# Patient Record
Sex: Female | Born: 1945 | Race: White | Hispanic: No | Marital: Single | Smoking: Never smoker
Health system: Southern US, Community
[De-identification: ages and names within clinical notes are randomized; demographics above are authoritative.]

## PROBLEM LIST (undated history)

## (undated) DIAGNOSIS — T7840XA Allergy, unspecified, initial encounter: Secondary | ICD-10-CM

## (undated) DIAGNOSIS — M8580 Other specified disorders of bone density and structure, unspecified site: Secondary | ICD-10-CM

## (undated) DIAGNOSIS — C50919 Malignant neoplasm of unspecified site of unspecified female breast: Secondary | ICD-10-CM

## (undated) DIAGNOSIS — M1990 Unspecified osteoarthritis, unspecified site: Secondary | ICD-10-CM

## (undated) DIAGNOSIS — Z8719 Personal history of other diseases of the digestive system: Secondary | ICD-10-CM

## (undated) DIAGNOSIS — B2790 Infectious mononucleosis, unspecified without complication: Secondary | ICD-10-CM

## (undated) DIAGNOSIS — N390 Urinary tract infection, site not specified: Secondary | ICD-10-CM

## (undated) DIAGNOSIS — N059 Unspecified nephritic syndrome with unspecified morphologic changes: Secondary | ICD-10-CM

## (undated) DIAGNOSIS — Z889 Allergy status to unspecified drugs, medicaments and biological substances status: Secondary | ICD-10-CM

## (undated) DIAGNOSIS — E860 Dehydration: Principal | ICD-10-CM

## (undated) DIAGNOSIS — F419 Anxiety disorder, unspecified: Secondary | ICD-10-CM

## (undated) DIAGNOSIS — M797 Fibromyalgia: Secondary | ICD-10-CM

## (undated) DIAGNOSIS — G5600 Carpal tunnel syndrome, unspecified upper limb: Secondary | ICD-10-CM

## (undated) DIAGNOSIS — F32A Depression, unspecified: Secondary | ICD-10-CM

## (undated) DIAGNOSIS — R112 Nausea with vomiting, unspecified: Secondary | ICD-10-CM

## (undated) DIAGNOSIS — F329 Major depressive disorder, single episode, unspecified: Secondary | ICD-10-CM

## (undated) DIAGNOSIS — L709 Acne, unspecified: Secondary | ICD-10-CM

## (undated) DIAGNOSIS — I10 Essential (primary) hypertension: Secondary | ICD-10-CM

## (undated) DIAGNOSIS — Z9889 Other specified postprocedural states: Secondary | ICD-10-CM

## (undated) DIAGNOSIS — E785 Hyperlipidemia, unspecified: Secondary | ICD-10-CM

## (undated) DIAGNOSIS — E88819 Insulin resistance, unspecified: Secondary | ICD-10-CM

## (undated) DIAGNOSIS — Z9221 Personal history of antineoplastic chemotherapy: Secondary | ICD-10-CM

## (undated) DIAGNOSIS — M7989 Other specified soft tissue disorders: Secondary | ICD-10-CM

## (undated) DIAGNOSIS — K759 Inflammatory liver disease, unspecified: Secondary | ICD-10-CM

## (undated) HISTORY — DX: Other specified disorders of bone density and structure, unspecified site: M85.80

## (undated) HISTORY — DX: Unspecified nephritic syndrome with unspecified morphologic changes: N05.9

## (undated) HISTORY — DX: Fibromyalgia: M79.7

## (undated) HISTORY — DX: Allergy, unspecified, initial encounter: T78.40XA

## (undated) HISTORY — PX: EYE SURGERY: SHX253

## (undated) HISTORY — DX: Other specified soft tissue disorders: M79.89

## (undated) HISTORY — DX: Infectious mononucleosis, unspecified without complication: B27.90

## (undated) HISTORY — DX: Depression, unspecified: F32.A

## (undated) HISTORY — DX: Dehydration: E86.0

## (undated) HISTORY — DX: Major depressive disorder, single episode, unspecified: F32.9

## (undated) HISTORY — PX: CATARACT EXTRACTION: SUR2

## (undated) HISTORY — PX: BREAST SURGERY: SHX581

## (undated) HISTORY — DX: Malignant neoplasm of unspecified site of unspecified female breast: C50.919

## (undated) HISTORY — DX: Essential (primary) hypertension: I10

## (undated) HISTORY — PX: KNEE ARTHROSCOPY: SUR90

## (undated) HISTORY — DX: Insulin resistance, unspecified: E88.819

## (undated) HISTORY — DX: Carpal tunnel syndrome, unspecified upper limb: G56.00

## (undated) HISTORY — DX: Hyperlipidemia, unspecified: E78.5

## (undated) HISTORY — DX: Unspecified osteoarthritis, unspecified site: M19.90

---
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
## 1974-09-14 DIAGNOSIS — K759 Inflammatory liver disease, unspecified: Secondary | ICD-10-CM

## 1974-09-14 HISTORY — DX: Inflammatory liver disease, unspecified: K75.9

## 1976-09-14 HISTORY — PX: TUBAL LIGATION: SHX77

## 1979-09-15 HISTORY — PX: ABDOMINAL HYSTERECTOMY: SHX81

## 1981-09-14 HISTORY — PX: OTHER SURGICAL HISTORY: SHX169

## 1984-09-14 HISTORY — PX: OTHER SURGICAL HISTORY: SHX169

## 1989-09-14 HISTORY — PX: ETHMOIDECTOMY: SHX5197

## 1994-09-14 HISTORY — PX: OTHER SURGICAL HISTORY: SHX169

## 1996-09-14 HISTORY — PX: BUNIONECTOMY: SHX129

## 1999-09-15 HISTORY — PX: NASAL SINUS SURGERY: SHX719

## 2000-04-09 ENCOUNTER — Encounter: Payer: Self-pay | Admitting: Orthopedic Surgery

## 2000-04-13 ENCOUNTER — Ambulatory Visit (HOSPITAL_COMMUNITY): Admission: RE | Admit: 2000-04-13 | Discharge: 2000-04-13 | Payer: Self-pay | Admitting: Orthopedic Surgery

## 2000-04-30 ENCOUNTER — Encounter: Admission: RE | Admit: 2000-04-30 | Discharge: 2000-04-30 | Payer: Self-pay | Admitting: *Deleted

## 2000-04-30 ENCOUNTER — Encounter: Payer: Self-pay | Admitting: *Deleted

## 2002-01-23 ENCOUNTER — Other Ambulatory Visit: Admission: RE | Admit: 2002-01-23 | Discharge: 2002-01-23 | Payer: Self-pay | Admitting: *Deleted

## 2002-05-26 ENCOUNTER — Encounter: Admission: RE | Admit: 2002-05-26 | Discharge: 2002-05-26 | Payer: Self-pay | Admitting: Family Medicine

## 2002-05-26 ENCOUNTER — Encounter: Payer: Self-pay | Admitting: Family Medicine

## 2002-07-28 ENCOUNTER — Encounter (INDEPENDENT_AMBULATORY_CARE_PROVIDER_SITE_OTHER): Payer: Self-pay | Admitting: Specialist

## 2002-07-28 ENCOUNTER — Ambulatory Visit (HOSPITAL_COMMUNITY): Admission: RE | Admit: 2002-07-28 | Discharge: 2002-07-28 | Payer: Self-pay | Admitting: Gastroenterology

## 2003-04-16 ENCOUNTER — Encounter: Admission: RE | Admit: 2003-04-16 | Discharge: 2003-07-15 | Payer: Self-pay

## 2003-09-15 HISTORY — PX: JOINT REPLACEMENT: SHX530

## 2004-09-14 HISTORY — PX: TOTAL KNEE ARTHROPLASTY: SHX125

## 2005-01-27 ENCOUNTER — Ambulatory Visit: Payer: Self-pay | Admitting: Physical Medicine & Rehabilitation

## 2005-01-27 ENCOUNTER — Inpatient Hospital Stay (HOSPITAL_COMMUNITY): Admission: RE | Admit: 2005-01-27 | Discharge: 2005-02-02 | Payer: Self-pay | Admitting: Orthopedic Surgery

## 2005-03-13 ENCOUNTER — Other Ambulatory Visit: Admission: RE | Admit: 2005-03-13 | Discharge: 2005-03-13 | Payer: Self-pay | Admitting: Family Medicine

## 2008-08-21 ENCOUNTER — Ambulatory Visit (HOSPITAL_COMMUNITY): Admission: RE | Admit: 2008-08-21 | Discharge: 2008-08-21 | Payer: Self-pay | Admitting: Obstetrics and Gynecology

## 2008-09-14 HISTORY — PX: OTHER SURGICAL HISTORY: SHX169

## 2008-10-10 ENCOUNTER — Encounter: Admission: RE | Admit: 2008-10-10 | Discharge: 2009-01-02 | Payer: Self-pay | Admitting: Surgery

## 2008-12-10 ENCOUNTER — Ambulatory Visit (HOSPITAL_COMMUNITY): Admission: RE | Admit: 2008-12-10 | Discharge: 2008-12-10 | Payer: Self-pay | Admitting: Surgery

## 2008-12-27 ENCOUNTER — Encounter: Admission: RE | Admit: 2008-12-27 | Discharge: 2008-12-27 | Payer: Self-pay | Admitting: Surgery

## 2009-02-07 ENCOUNTER — Encounter: Admission: RE | Admit: 2009-02-07 | Discharge: 2009-04-16 | Payer: Self-pay | Admitting: Surgery

## 2009-05-15 ENCOUNTER — Encounter: Admission: RE | Admit: 2009-05-15 | Discharge: 2009-07-15 | Payer: Self-pay | Admitting: Surgery

## 2009-06-18 ENCOUNTER — Encounter: Admission: RE | Admit: 2009-06-18 | Discharge: 2009-06-18 | Payer: Self-pay | Admitting: Surgery

## 2010-09-14 DIAGNOSIS — C50919 Malignant neoplasm of unspecified site of unspecified female breast: Secondary | ICD-10-CM

## 2010-09-14 DIAGNOSIS — Z9221 Personal history of antineoplastic chemotherapy: Secondary | ICD-10-CM

## 2010-09-14 HISTORY — PX: MASTECTOMY: SHX3

## 2010-09-14 HISTORY — DX: Personal history of antineoplastic chemotherapy: Z92.21

## 2010-09-14 HISTORY — DX: Malignant neoplasm of unspecified site of unspecified female breast: C50.919

## 2010-12-25 LAB — CBC
HCT: 42 % (ref 36.0–46.0)
Platelets: 278 10*3/uL (ref 150–400)
RDW: 13.1 % (ref 11.5–15.5)
WBC: 10.7 10*3/uL — ABNORMAL HIGH (ref 4.0–10.5)

## 2010-12-25 LAB — COMPREHENSIVE METABOLIC PANEL
ALT: 28 U/L (ref 0–35)
AST: 25 U/L (ref 0–37)
Albumin: 3.8 g/dL (ref 3.5–5.2)
Alkaline Phosphatase: 85 U/L (ref 39–117)
Chloride: 101 mEq/L (ref 96–112)
GFR calc Af Amer: >60 mL/min (ref >60)
Potassium: 3.5 mEq/L (ref 3.5–5.1)
Sodium: 137 mEq/L (ref 135–145)
Total Bilirubin: 0.9 mg/dL (ref 0.3–1.2)
Total Protein: 7 g/dL (ref 6.0–8.3)

## 2010-12-25 LAB — DIFFERENTIAL
Basophils Absolute: 0.1 10*3/uL (ref 0.0–0.1)
Basophils Relative: 1 % (ref 0–1)
Eosinophils Relative: 1 % (ref 0–5)
Monocytes Absolute: 0.8 10*3/uL (ref 0.1–1.0)
Monocytes Relative: 7 % (ref 3–12)

## 2011-01-27 NOTE — Op Note (Signed)
NAMEJAICEE, MICHELOTTI               ACCOUNT NO.:  [id number]   MEDICAL RECORD NO.:  [id number]          PATIENT TYPE:  AMB   LOCATION:  DAY                          FACILITY:  [facility]   PHYSICIAN:  Sandria Bales. Ezzard Standing, M.D.  DATE OF BIRTH:  03/07/1946   DATE OF PROCEDURE:  12/10/2008  DATE OF DISCHARGE:                               OPERATIVE REPORT   Date of surgery ?   PREOPERATIVE DIAGNOSES:  1. Morbid obesity (weight 253, BMI of 42.5).  2. Small hiatal hernia.   POSTOPERATIVE DIAGNOSES:  1. Morbid obesity (weight 253, BMI 42.5).  2. Small hiatal hernia.   PROCEDURE:  1. Hiatal hernia repair with two sutures posteriorly to the crus.  2. AP standard lap band.  3. Subcutaneous placement of port.   FIRST ASSISTANT:  Dr. Wenda Low.   ANESTHESIA:  General endotracheal.   BLOOD LOSS:  Minimal.   PROCEDURE:  Ms. Jamal Maes is 65 year old white female who is a patient  Dr. Talmadge Coventry who has completed our bariatric program and is  interested in a lap band.   The indications, potential complications of lap band placement were  explained to the patient.  Potential complication of lap band placed  include but not limited to bleeding, infection, bowel injury, and  slippage of the band.   OPERATIVE NOTE:  The patient placed in a supine position and given a  general endotracheal anesthetic supervised by Neta Mends.  She was  given 1 gram of Ancef initially at this the procedure.  Her abdomen was  prepped with chlorhexidine and a time-out held, identifying the patient  and the procedure.   DESCRIPTION OF PROCEDURE:  I accessed her abdominal cavity through the  left upper quadrant with 11 mm OptiVu  Ethicon trocar.  This entered the  abdominal cavity without difficulty.  I did abdominal exploration.  The  right and left lobes of liver unremarkable.  The bowel, that I could  see, was unremarkable.  Omentum covered some of the bowel and the  stomach was unremarkable.   I  placed 5 additional trocars, a 5-mm subxiphoid trocar for the liver,  retracted into the liver, 15 mm trocar in the right subcostal location,  and an 11 mm right paramedian trocar and a 11 mm left paramedian trocar  and 11 mm left lateral trocar.   Examining the gastroesophageal junction, the patient did have a dimple  anterior to her gastroesophageal junction consistent with a probable  small hiatal hernia.  I went on and made an incision along the left  gastroesophageal junction angle of Hiss and identified the left crus.  I  opened the gastrohepatic ligament, identified the right crus and I  passed the finger dissector posteriorly to the angle of Hiss.   I then introduced an AP standard band and passed this around with the  tubing around but not the band itself around.  I then had anesthesia  passed down our sizing tube.  This was insufflated 15 mL of air and  pulled back and proved that she did have a widely open hiatus that I  thought aught to be repaired.   I then identified both the right and the left crura posteriorly and I  placed two sutures closing this by about 3 cm.  They then repassed the  sizing tube into the stomach and re-insufflated the air with 15 mL.  At  this time it held to the hiatus, indicating an adequate closure of the  hiatal hernia.   I then brought the balloon device of the lap band around and cinched  this down on the sizing tube without difficulty.  The sizing tube was  then removed.  I then placed three sutures using 0 Ethilon suture,  imbricating the lower stomach over the stomach above the band.  This was  done sort of left lateral and used tie knots to cinch down this suture.   After the band had been imbricated, I then reinspected the band.  I took  photos and placed in the chart.  The band lay in correct orientation.  There was not too much tension on the stomach or the band and there was  no bleeding.   I then retrieved the tip of the Silastic  tubing out through the right  paramedian port.  I removed the retractor and removed all the trocars  under direct visualization.  There was no bleeding at trocar site.   She has a fairly thick abdominal wall and I placed a backing of  polypropylene on the port.  I then attached the port to the Silastic  tubing and place silk pocket immediately lateral to her right paramedian  incision.   I placed subcutaneous 3-0 Vicryl sutures.  Each wound itself was closed  with 5-0 subcuticular Vicryl suture.  I injected about 20 mL of 4%  Marcaine plain in each of the incisions and then the wounds were painted  with tincture Benzoin, Steri-stripped and sterilely dressed.   The patient tolerated the procedure well, was transported to recovery  room in good condition.  Sponge and needle counts were at the end of the  case.  My plan is to let her go home today if she does well.      Sandria Bales. Ezzard Standing, M.D.  Electronically Signed     DHN/MEDQ  D:  12/10/2008  T:  12/10/2008  Job:  [id number]   cc:   Talmadge Coventry, M.D.  Fax: [phone]   Marlowe Kays, M.D.  Fax: [phone]   Cindee Salt, M.D.  Fax: [phone]   Estill Bakes, M.D.   Gardiner Coins, P.A.-C.  [address]  [place], [place] [address]

## 2011-01-30 NOTE — Discharge Summary (Signed)
[name], [name]               ACCOUNT NO.:  [id number]   MEDICAL RECORD NO.:  [id number]          PATIENT TYPE:  INP   LOCATION:  1508                         FACILITY:  [facility]   PHYSICIAN:  Marlowe Kays, M.D.  DATE OF BIRTH:  June 30, 1946   DATE OF ADMISSION:  01/27/2005  DATE OF DISCHARGE:  02/02/2005                                 DISCHARGE SUMMARY   ADMITTING DIAGNOSES:  1.  Severe osteoarthritis of the left knee.  2.  Dyslipidemia.  3.  Hypertension.  4.  Gastroesophageal reflux disease.   DISCHARGE DIAGNOSES:  1.  Severe osteoarthritis of the left knee.  2.  Dyslipidemia.  3.  Hypertension.  4.  Gastroesophageal reflux disease.  5.  Mild postoperative anemia.   OPERATION:  On Jan 27, 2005, the patient underwent [employer]onics total knee  replacement arthroplasty to the left knee, all three components cemented,  Dr. Ranee Gosselin assisted.   CONSULTANTS:  None.   BRIEF HISTORY:  This 65 year old lady had progressive pain into her left  knee.  She had a scope in the past x 2 which provided her with some relief;  however, at this point in time, she is having marked interference with her  day-to-day activities with a considerable amount of pain.  After much  discussion including the risks and benefits from surgery and the fact that  she did not benefit with conservative care recently, it was felt she would  benefit from total replacement arthroplasty and was scheduled for same.  X-  rays showed severe osteoarthritis arthritis.   COURSE IN HOSPITAL:  The patient tolerated the surgical procedure quite  well.  She was placed on Coumadin protocol postoperatively for prevention of  DVT.  The Hemovac was discontinued the first postop day with no difficulty.  Dressing change was done on the second day.  Wound remained clean and dry.  The patient progressed very slowly with therapy.  It was thought that she  might need a skilled nursing facility or even a rehab program;  however, the  last day or two in the hospital, the family unit came together and felt that  they could help her at home.  She did live essentially alone.  We therefore  changed plans for home health and home DME.   On the day of discharge, she was ambulating well.  Wound was clean and dry.  Neurovascular was intact in the operative extremity.  She was quite stable,  awake, alert, and relatively comfortable on her medications.   Laboratory values in the hospital hematologically showed a CBC  preoperatively essentially within normal limits other than a slight elevated  white count at 12.5.  Hemoglobin was 14.0, hematocrit 41.6.  Final  hemoglobin was 9.5 with hematocrit of 28.0.  Blood chemistries all within  normal limits, and urinalysis negative for urinary tract infection.  Electrocardiogram showed normal sinus rhythm with possible left atrial  enlargement.  Electrocardiogram showed no evidence for active chest disease.   CONDITION ON DISCHARGE:  Improved, stable.   PLAN:  The patient is transferred to her home in the care of her  family.  She can continue with weightbearing as tolerated to the operative extremity.  return to see Dr. Simonne Come 2 weeks after the date of surgery.  She is to  continue with her home medications and diet and follow up with Dr. Smith Mince  per her instructions.  We have given her medications for discomfort.  Talwin  1 tablet every 4 hours as needed for pain, Robaxin 500 mg q.4-6h. p.r.n.  muscle spasm, and Coumadin for 4 weeks after the date of surgery.  She may  shower 4 days after surgery.      DLU/MEDQ  D:  02/13/2005  T:  02/14/2005  Job:  [id number]   cc:   Talmadge Coventry, M.D.  [address]  [place]  [place] [address]  Fax: [phone]
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
## 2011-03-17 ENCOUNTER — Other Ambulatory Visit: Payer: Self-pay | Admitting: Family Medicine

## 2011-03-17 DIAGNOSIS — Z780 Asymptomatic menopausal state: Secondary | ICD-10-CM

## 2011-03-17 DIAGNOSIS — Z1231 Encounter for screening mammogram for malignant neoplasm of breast: Secondary | ICD-10-CM

## 2011-03-27 ENCOUNTER — Other Ambulatory Visit: Payer: Self-pay

## 2011-03-27 ENCOUNTER — Ambulatory Visit: Payer: Self-pay

## 2011-04-03 ENCOUNTER — Ambulatory Visit: Payer: Self-pay

## 2011-04-03 ENCOUNTER — Other Ambulatory Visit: Payer: Self-pay

## 2011-04-23 ENCOUNTER — Other Ambulatory Visit: Payer: Self-pay

## 2011-04-23 ENCOUNTER — Ambulatory Visit: Payer: Self-pay

## 2011-05-07 ENCOUNTER — Ambulatory Visit
Admission: RE | Admit: 2011-05-07 | Discharge: 2011-05-07 | Disposition: A | Discharge status: Home / Self Care | Payer: BC Managed Care – PPO | Source: Ambulatory Visit | Attending: Family Medicine | Admitting: Family Medicine

## 2011-05-07 DIAGNOSIS — Z1231 Encounter for screening mammogram for malignant neoplasm of breast: Secondary | ICD-10-CM

## 2011-05-07 DIAGNOSIS — Z780 Asymptomatic menopausal state: Secondary | ICD-10-CM

## 2011-05-14 ENCOUNTER — Other Ambulatory Visit: Payer: Self-pay | Admitting: Family Medicine

## 2011-05-14 DIAGNOSIS — R928 Other abnormal and inconclusive findings on diagnostic imaging of breast: Secondary | ICD-10-CM

## 2011-05-19 ENCOUNTER — Other Ambulatory Visit: Payer: Self-pay | Admitting: Family Medicine

## 2011-05-19 DIAGNOSIS — R928 Other abnormal and inconclusive findings on diagnostic imaging of breast: Secondary | ICD-10-CM

## 2011-05-22 ENCOUNTER — Other Ambulatory Visit: Payer: Self-pay | Admitting: Family Medicine

## 2011-05-22 DIAGNOSIS — R928 Other abnormal and inconclusive findings on diagnostic imaging of breast: Secondary | ICD-10-CM

## 2011-05-27 ENCOUNTER — Ambulatory Visit
Admission: RE | Admit: 2011-05-27 | Discharge: 2011-05-27 | Disposition: A | Discharge status: Home / Self Care | Payer: BC Managed Care – PPO | Source: Ambulatory Visit | Attending: Family Medicine | Admitting: Family Medicine

## 2011-05-27 ENCOUNTER — Other Ambulatory Visit: Payer: Self-pay | Admitting: Family Medicine

## 2011-05-27 DIAGNOSIS — R928 Other abnormal and inconclusive findings on diagnostic imaging of breast: Secondary | ICD-10-CM

## 2011-05-28 ENCOUNTER — Other Ambulatory Visit: Payer: Self-pay | Admitting: Family Medicine

## 2011-05-28 ENCOUNTER — Ambulatory Visit
Admission: RE | Admit: 2011-05-28 | Discharge: 2011-05-28 | Disposition: A | Discharge status: Home / Self Care | Payer: BC Managed Care – PPO | Source: Ambulatory Visit | Attending: Family Medicine | Admitting: Family Medicine

## 2011-05-28 DIAGNOSIS — R928 Other abnormal and inconclusive findings on diagnostic imaging of breast: Secondary | ICD-10-CM

## 2011-05-28 DIAGNOSIS — C50911 Malignant neoplasm of unspecified site of right female breast: Secondary | ICD-10-CM

## 2011-06-02 ENCOUNTER — Ambulatory Visit
Admission: RE | Admit: 2011-06-02 | Discharge: 2011-06-02 | Disposition: A | Discharge status: Home / Self Care | Payer: BC Managed Care – PPO | Source: Ambulatory Visit | Attending: Family Medicine | Admitting: Family Medicine

## 2011-06-02 DIAGNOSIS — C50911 Malignant neoplasm of unspecified site of right female breast: Secondary | ICD-10-CM

## 2011-06-02 MED ORDER — GADOBENATE DIMEGLUMINE 529 MG/ML IV SOLN
20.0000 mL | Freq: Once | INTRAVENOUS | Status: AC | PRN
  Administered 2011-06-02: 20 mL via INTRAVENOUS

## 2011-06-03 ENCOUNTER — Encounter (INDEPENDENT_AMBULATORY_CARE_PROVIDER_SITE_OTHER): Payer: Self-pay | Admitting: General Surgery

## 2011-06-03 ENCOUNTER — Ambulatory Visit: Payer: BC Managed Care – PPO | Attending: General Surgery | Admitting: Physical Therapy

## 2011-06-03 ENCOUNTER — Encounter (INDEPENDENT_AMBULATORY_CARE_PROVIDER_SITE_OTHER): Payer: BC Managed Care – PPO | Admitting: General Surgery

## 2011-06-03 ENCOUNTER — Encounter (HOSPITAL_BASED_OUTPATIENT_CLINIC_OR_DEPARTMENT_OTHER): Payer: BC Managed Care – PPO | Admitting: Oncology

## 2011-06-03 ENCOUNTER — Other Ambulatory Visit: Payer: Self-pay | Admitting: Oncology

## 2011-06-03 ENCOUNTER — Ambulatory Visit: Payer: BC Managed Care – PPO | Admitting: Physical Therapy

## 2011-06-03 ENCOUNTER — Ambulatory Visit (HOSPITAL_BASED_OUTPATIENT_CLINIC_OR_DEPARTMENT_OTHER): Payer: BC Managed Care – PPO | Admitting: General Surgery

## 2011-06-03 VITALS — BP 134/81 | HR 74 | Temp 98.3°F | Resp 20 | Ht 63.4 in | Wt 203.5 lb

## 2011-06-03 VITALS — BP 139/82 | HR 73 | Temp 97.9°F | Resp 20 | Ht 63.5 in | Wt 223.8 lb

## 2011-06-03 DIAGNOSIS — IMO0001 Reserved for inherently not codable concepts without codable children: Secondary | ICD-10-CM | POA: Insufficient documentation

## 2011-06-03 DIAGNOSIS — M25619 Stiffness of unspecified shoulder, not elsewhere classified: Secondary | ICD-10-CM | POA: Insufficient documentation

## 2011-06-03 DIAGNOSIS — C50919 Malignant neoplasm of unspecified site of unspecified female breast: Secondary | ICD-10-CM | POA: Insufficient documentation

## 2011-06-03 DIAGNOSIS — C50219 Malignant neoplasm of upper-inner quadrant of unspecified female breast: Secondary | ICD-10-CM

## 2011-06-03 LAB — COMPREHENSIVE METABOLIC PANEL
Albumin: 3.6 g/dL (ref 3.5–5.2)
CO2: 27 mEq/L (ref 19–32)
Chloride: 98 mEq/L (ref 96–112)
Glucose, Bld: 120 mg/dL — ABNORMAL HIGH (ref 70–99)
Potassium: 4 mEq/L (ref 3.5–5.3)
Sodium: 134 mEq/L — ABNORMAL LOW (ref 135–145)
Total Protein: 7 g/dL (ref 6.0–8.3)

## 2011-06-03 LAB — CBC WITH DIFFERENTIAL/PLATELET
Eosinophils Absolute: 0.2 10*3/uL (ref 0.0–0.5)
MONO#: 0.6 10*3/uL (ref 0.1–0.9)
NEUT#: 3.5 10*3/uL (ref 1.5–6.5)
RBC: 4.66 10*6/uL (ref 3.70–5.45)
RDW: 12.9 % (ref 11.2–14.5)
WBC: 6.6 10*3/uL (ref 3.9–10.3)

## 2011-06-03 LAB — CANCER ANTIGEN 27.29: CA 27.29: 28 U/mL (ref 0–39)

## 2011-06-03 NOTE — Patient Instructions (Signed)
Breast Cancer, Early Stage, Surgery Choices Breast cancer is the most common cancer, except for skin cancer. It is the second most common cause of death, except for lung cancer, in women. The risk of a woman developing breast cancer is 12.5%. Breast cancer in women younger than 65 years old is rare. Most of these cancer cases have spread to the breast from another part of the body (metastatic). Finding out that you have breast cancer is an emotional shock and is difficult to understand, because it is an overwhelming, serious, and life-threatening disease. You will need to make important decisions about how you want it treated.  As a woman with early-stage breast cancer (DCIS or Stage I, IIA, IIB, IIIA, or IV) you may be able to choose which type of breast surgery to have. Your caregiver will help you understand what stage you are in. Often, your choices are:  Removal of the cancerous part(s) of the breast (breast-sparing surgery).   Lumpectomy.   Partial/Segmental mastectomy.   Removal of the whole breast (simple mastectomy). Research shows that women with early-stage breast cancer who have breast-sparing surgery along with radiation therapy live as long as those who have a mastectomy. Most women with breast cancer will lead long, healthy lives after treatment.   Reconstructive surgery. This type of surgery is to make the breast and nipple area as normal looking as it was before the mastectomy. It is usually done by a plastic surgeon at the same time as the mastectomy. There are several types of reconstructive surgery that should be discussed with your caregiver and plastic surgeon.   Lymph node surgery.   Sentinel (removing those lymph nodes in the armpit that breast cancer spreads to first).   Axillary lymph node dissection (removing all the lymph nodes in the armpit).  TREATMENT Treatment for breast cancer usually begins a few weeks after diagnosis. In these weeks, you should meet with a  surgeon, learn the facts about your surgery choices, treatment options, get a second opinion, and think about what is important to you.  Treatment choices include:  Surgery.   Radiation.   Chemotherapy.   Medicines, hormones.   Reconstructive breast surgery.   Any combination of the above treatments.  Choose which kind of surgery to have. If radiation, chemotherapy, or hormone therapy is considered, this also should be discussed before the surgery. Radical breast surgery is rarely performed now. Usually, lumpectomy, partial/segmental mastectomy, simple mastectomy in combination with radiation, chemotherapy, and/or hormone treatment is recommended. Clinical trial protocols (specific treatment plan with surgery and radiation, chemotherapy, and hormones) for breast cancer have been put in place in hospitals, universities, medical schools, and cancer centers all over the country. These patients are followed for several years to find out what treatment gives the best results for the protocol given, for the different stages of breast cancer.  Most women want to make this choice with help from their caregiver. After all, the kind of surgery you have will affect how you look and feel. But it is often hard to decide what to do. The more information you have, the better the decision you can make.  Talk to a surgeon about your breast cancer surgery choices. Find out what happens during surgery, types of problems that sometimes occur, and other kinds of treatment (if any) you will need after surgery. Be sure to ask a lot of questions and learn as much as you can. You may also wish to talk with family members, friends, or  others who have had breast cancer surgery, radiation therapy, chemotherapy, or hormone therapy.   After talking with a surgeon, you may want a second opinion. This means talking with another doctor or surgeon who might tell you about other treatment options. They may simply give you  information that can help you feel better about the choice you are making. Do not worry about hurting your surgeon's feelings. It is common practice to get a second opinion, and some insurance companies require it. Plus, it is better to get a second opinion than to worry that you have made the wrong choice.  STAGES OF BREAST CANCER Staging of breast cancer depends on the size of the tumor, if and how far it has spread, lymph node involvement in the armpits, and whether it has spread to other parts of the body. If you are unsure of the stage of your cancer, ask your caregiver. The following are the stages of breast cancer:  Stage 0: This means you either have DCIS or LCIS.   DCIS (Ductal Carcinoma in Situ) is very early breast cancer. It is often too small to form a lump. Your caregiver may refer to DCIS as noninvasive cancer.   LCIS (Lobular Carcinoma in Situ) is not cancer, but it may increase the chance that you will get breast cancer. Talk with your caregiver about treatment options, if you are diagnosed with LCIS.   The 5 year survival rate in this stage is almost 100%.   Stage I:   Your cancer is less than 1 inch across (2 centimeters) or about the size of a quarter. The cancer is only in the breast. It has not spread to lymph nodes or other parts of your body.   Stage IIA:   No cancer is found in your breast. However, cancer is found in the lymph nodes under your arm, or   Your cancer is 1 inch (2 centimeters) or smaller. It has spread to 3 lymph nodes or less (the lymph nodes under your arm), or   Your cancer is about 1-2 inches (2-5 centimeters). It has not spread to the lymph nodes under your arm.   Stage IIB:   Your cancer is about 1-2 inches (2-5 centimeters). It has spread to the lymph nodes under your arm, or   Your cancer is larger than 2 inches (5 centimeters). It has not spread to the lymph nodes under your arm.   The 5 year survival rate is 85 to 90%.   Stage IIIA:     No cancer is found in the breast. It is found in lymph nodes under your arm. The lymph nodes are attached to each other, or   Your cancer is 2 inches (5 centimeters) or smaller. It has spread to lymph nodes under your arm. The lymph nodes are attached to each other, or   Your cancer is larger than 2 inches (5 centimeters) and has spread to lymph nodes under your arm, and they are not attached to each other.   Your cancer is smaller than 2 inches (5 centimeters) and has spread to lymph nodes above your collarbone.   Inflammatory cancer of the breast (red rash, tender and swollen breasts) is in the stage III category.   The 5 year survival rate is 45 to 67%.   Stage VI:   Cancer cells have spread to other parts of the body.   The 5 year survival rate is about 20%.  ABOUT LYMPH NODES  Lymph nodes  are part of your body's immune system, which helps fight infection and disease. Lymph nodes are small, round, and clustered (like a bunch of grapes) throughout your body.   Axillary lymph nodes are in the area under your arm. Breast cancer may spread to these lymph nodes first, even when the tumor in the breast is small. This is why most surgeons take out some of these lymph nodes at the time of breast surgery.   Lymphedema is swelling caused by a buildup of lymph fluid. You may have this type of swelling in your arm, if your lymph nodes are taken out with surgery or damaged by radiation therapy.   Lymphedema can show up soon after surgery. The symptoms are often mild and last for a short time.   Lymphedema can show up months or even years after cancer treatment is over. Often, lymphedema develops after an insect bite, minor injury, or burn on the arm where your lymph nodes were removed. Sometimes, this can be painful. One way to reduce the swelling is to work with a caregiver who specializes in rehabilitation, or with a physical therapist.  Sentinel lymph node biopsy is surgery to remove as few  lymph nodes as possible from under the arm. The surgeon first injects a dye in the breast to see which lymph nodes the breast tumor drains into. Then, he or she removes these nodes to see if they have any cancer. If there is no cancer, the surgeon may leave the other lymph nodes in place. This surgery is fairly new and is being studied experimentally. Talk with your surgeon if you want to learn more.  COMPARE YOUR CHOICES   Breast-Sparing Surgery Mastectomy Surgery Mastectomy and Breast Reconstruction Surgery  Is this surgery right for me? Breast-sparing surgery with radiation is a safe choice for most women who have early-stage breast cancer. This means that your cancer is DCIS or at Stage I, IIA, IIB, or IIIA. Mastectomy is a safe choice for women who have early-stage breast cancer (DCIS, Stage I, IIA, IIB, or IIIA). You may need a mastectomy if:   You have small breasts and a large tumor.   You have cancer in more than one part of your breast.   The tumor is under the nipple.   You do not have access to radiation therapy.  If you have a mastectomy, you might also want breast reconstruction surgery.   You can choose to have reconstruction surgery at the same time as your mastectomy.   You can wait and have it at a later date.   What are the names of the different kinds of surgery?  Lumpectomy.   Partial mastectomy.   Breast-sparing surgery.   Segmental mastectomy.   Simple mastectomy.   Sentinel lymph node removal.   Axillary lymph node excision.   Total mastectomy.   Modified radical mastectomy (not usually done now).   Double mastectomy.   Simple mastectomy.   Breast implant.   Tissue flap surgery.   Reconstructive plastic surgery.   What doctors am I likely to see?  Oncologist.   Surgeon.   Radiation Oncologist.   Chemotherapy Oncologist.   Oncologist.   Surgeon.   Radiation Oncologist.   Chemotherapy Oncologist.   Oncologist.   Surgeon.   Radiation  Oncologist.   Reconstructive Plastic Surgeon.   Chemotherapy Oncologist.   What will my breast look like after surgery? Your breast should look a lot like it did before surgery. But if your tumor is  large, your breast may look different or smaller after breast-sparing surgery. Your breast and nipple will be removed. You will have a flat chest on the side of your body where the breast was removed. Although you will have a breast-like shape, your breast will not look the same as it did before surgery.  Will I have feeling in the area around my breast? Yes. You should still have feeling in your breast, nipple, and areola (dark area around your nipple). Maybe. After surgery, you will have no feeling (numb) in your chest wall and maybe also under your arm. This numb feeling should go away in 1-2 years, but it will never feel like it did before. Also, the skin where your breast was may feel tight. No. The area around your breast will always be numb (have no feeling).  Will I have pain after the surgery? You may have pain after surgery. Talk with your caregiver about ways to control this pain.  You may have pain after surgery. Talk with your caregiver about ways to control this pain. You are likely to have pain after major surgery, such as mastectomy and reconstruction surgery. There are many ways to deal with pain. Let your caregiver know if you need relief from pain.   What other problems can I expect? You may feel very tired after radiation therapy.  You may get swelling in your arm (lymphedema). You may have pain in your neck or back. You may feel out of balance, if you had large breasts and do not have reconstruction surgery.  You may get swelling in your arm (lymphedema). It may take you many weeks or even months to recover from breast reconstruction surgery.  If you have an implant, you may get infections, pain, or hardness. Also, you may not like how your breast-like shape looks. You may need more  surgery if your implant breaks or leaks.  If you have tissue flap surgery, you may lose strength in the part of your body where the flap came from.  You may get swelling in your arm (lymphedema).   Will I need more surgery? Maybe. You may need more surgery to remove lymph nodes from under your arm. Also, if the surgeon does not remove all your cancer the first time, you may need more surgery.  Maybe. You may need surgery to remove lymph nodes from under your arm. Also, if you have problems after your mastectomy, you may need to see your surgeon for treatment. Yes. You will need surgery at least 2 more times to build a new breast-like shape. With implants, you may need more surgery months or years later. You may also need surgery to remove lymph nodes from under your arm.  What other types of treatment will I need? You may need radiation therapy, given almost every day for 5 to 8 weeks. You may also need chemotherapy, hormone therapy, or both. You may also need chemotherapy, hormone therapy, or radiation therapy. Some women get all 3 types of therapy or any combination of the 3. You may need chemotherapy, hormone therapy, or radiation therapy. Some women get all 3 types of therapy or any combination of the 3.  Will insurance pay for my surgery? Check with your insurance company to find out how much it pays for breast cancer surgery and other needed treatments. Check with your insurance company to find out how much it pays for breast cancer surgery and other needed treatments. Check with your insurance company to find  out if it pays for breast reconstruction surgery. You should also ask if your insurance will pay for problems that may result from breast reconstruction surgery.  Will the type of surgery I have affect how long I live? Women with early-stage breast cancer who have breast-sparing surgery followed by radiation live just as long as women who have a mastectomy. Most women with breast cancer will lead  long, healthy lives after treatment. Women with early-stage breast cancer who have a mastectomy live as long as women who have breast-sparing surgery followed by radiation therapy. Most women with breast cancer will lead long, healthy lives after treatment. Women with early-stage breast cancer who have a mastectomy live the same amount of time as women who have breast-sparing surgery followed by radiation therapy. Most women with breast cancer will lead long, healthy lives after treatment.  What are the chances that my cancer will come back after surgery? About 10% of women who have breast-sparing surgery along with radiation therapy get cancer in the same breast within 12 years. If this happens, you will need a mastectomy, but it will not affect how long you live. About 5% of women who have a mastectomy will get cancer on the same side of their chest within 12 years. About 5% of women who have a mastectomy will get cancer on the same side of their chest within 12 years. Breast reconstruction surgery does not affect the chances of your cancer coming back.  Where can I learn more about coping with life after cancer? To learn more about life after cancer, you might want to read "Facing Forward: Life After Cancer Treatment." You can get this booklet at [employer] or by calling 1-800-4-CANCER.  THINK ABOUT WHAT IS IMPORTANT TO YOU   After you have talked with your surgeon and learned the facts, you may also want to talk with your spouse or partner, family, friends, or other women who have had breast cancer surgery. The more you know about breast cancer, the treatment, and what happens afterward, the more informed you will be and the easier it will be for you to make good decisions that are important to you.   Think about what is important to you. Here are some questions to think about:   Do I want to get a second opinion?   How important is it to me how my breast looks after cancer surgery?     How important is it to me how my breast feels after cancer surgery?   If I have breast-sparing surgery or more extensive surgery, am I willing to get radiation, hormone and/or chemotherapy?   If I have a mastectomy, do I also want breast reconstruction surgery?   If I have breast reconstruction surgery, do I want it at the same time as my mastectomy?   What treatment does my insurance cover, and what do I need to pay for?   Who would I like to talk with about my surgery, radiation, hormone, and chemotherapy choices?   What else do I want to know, do, or learn before I make my choice about breast cancer surgery?   After I have learned all I could and have talked with my surgeon, I will make a choice that feels right for me.   A patient who takes the time to be well-informed will feel more comfortable about her decisions regarding surgery, and will feel better about the procedure following the surgery.   Coping and Support:   Be open  and willing to talk to your family and friends about your cancer. Family and friends can be your best support group, to help you work through your concerns and worries.   Discussing your thoughts, concerns, and intimacy issues with your spouse or partner is very important and helpful.   Join support groups to share and learn how others with breast cancer cope and deal with their cancer. The [employer] can help you find support groups in your area.   Breast cancer may affect your confidence and feelings about being a total woman. It may interfere with your intimate relationship with your partner. Counseling may be necessary to help you overcome these feelings.   Talk to a counselor, your clergyperson, psychologist, or psychiatrist.   Talk to a medical social worker, if you have financial questions or problems.   Do not be afraid to ask for help, especially during your treatment.   Learn to be as independent as possible, as soon as  possible.  Document Released: 11/21/2003 Document Re-Released: 02/18/2010 [facility] Patient Information 2011 [place], [place].

## 2011-06-03 NOTE — Progress Notes (Signed)
Chief Complaint  Patient presents with  . Other    breast cancer    HPI [name] is a 65 y.o. female.   HPI This is a 65 year old female with a family history of breast cancer in aunt at age 68 as well as her paternal grandmother. She went underwent a regular screening mammogram was 2 right breast masses and one area of microcalcifications. These are all less than a centimeter in size. These are in different quadrants and have been reviewed with the radiologist by me. 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 These are all estrogen and progesterone receptor positive. Two of these biopsies are HER-2/neu negative. The other biopsy his HER-2/neu positive with a ratio of 9.73. She has no complaints referable to her breasts. She underwent a right breast biopsy in 1986 for microcalcifications which proved to be benign. She underwent a second biopsy of this area for a mammographic abnormality a year later that showed just scar tissue to be present. She is also undergone an MRI which shows a irregularly enhancing mass laterally in the right breast at 9:00 in the upper outer quadrant measuring 1.8 x 0.9 x 0.8 cm. Additionally there is a 5 x 6 x 7 mm oval irregular enhancing mass in the central portion of the right breast. She also has clip artifact with postbiopsy change located superiorly in the right breast at the 12:00 position. There is no abnormal enhancement or areas on the left breast. There is no evidence of any axillary or internal mammary adenopathy.  Past Medical History  Diagnosis Date  . Allergy   . Asthma   . Hypertension   . Hyperlipidemia   . Depression   . Fibromyalgia   . Arthritis     feet, knee and base of thumbs  . Osteopenia   . Carpal tunnel syndrome     right    Past Surgical History  Procedure Date  . Tonsillectomy 1950  . Tubal ligation 1978  . Abdominal hysterectomy 1981  . Right ear surgery 1983   tumor removed (?)  . Right breast lumpectomy 1986  . Ethmoidectomy 1991  . Knee arthroscopy 1996, 2001    left  . Facial plastic surgery 1996  . Bunionectomy 1998    left foot  . Total knee arthroplasty 2006    left  . Nasal sinus surgery 2001  . Lap band surgery 2010    Family History  Problem Relation Age of Onset  . Breast cancer Paternal Aunt   . Breast cancer Paternal Grandmother     Social History History  Substance Use Topics  . Smoking status: Former Games developer  . Smokeless tobacco: Not on file  . Alcohol Use: 0.6 oz/week    1 Glasses of wine per week    No Known Allergies  Current Outpatient Prescriptions  Medication Sig Dispense Refill  . atorvastatin (LIPITOR) 20 MG tablet Take 20 mg by mouth daily.        . calcium gluconate 500 MG tablet Take 500 mg by mouth daily.        . cholecalciferol (VITAMIN D) 1000 UNITS tablet Take 2,000 Units by mouth daily.        . diphenhydrAMINE (SOMINEX) 25 MG tablet Take 25 mg by mouth at bedtime as needed.        . DULoxetine (CYMBALTA) 60 MG capsule Take 120 mg by mouth daily.        Marland Kitchen  fish oil-omega-3 fatty acids 1000 MG capsule Take 3 g by mouth daily.        Marland Kitchen gabapentin (NEURONTIN) 600 MG tablet Take 600 mg by mouth 3 (three) times daily.        Marland Kitchen lisinopril-hydrochlorothiazide (PRINZIDE,ZESTORETIC) 20-25 MG per tablet Take 1 tablet by mouth daily.        Marland Kitchen loratadine (CLARITIN) 10 MG tablet Take 10 mg by mouth daily.        . magnesium 30 MG tablet Take 500 mg by mouth 2 (two) times daily.        . Multiple Vitamin (MULTIVITAMIN) capsule Take 1 capsule by mouth daily.        . traMADol (ULTRAM) 50 MG tablet Take 50 mg by mouth as needed.         No current facility-administered medications for this visit.   Facility-Administered Medications Ordered in Other Visits  Medication Dose Route Frequency Provider Last Rate Last Dose  . gadobenate dimeglumine (MULTIHANCE) injection 20 mL  20 mL Intravenous Once PRN Medication  Radiologist   20 mL at 06/02/11 1109    Review of Systems Review of Systems  Constitutional: Positive for fatigue.  HENT: Positive for sinus pressure.   Respiratory: Positive for cough. Shortness of breath: with long periods of walking.   Neurological: Positive for dizziness.  Hematological: Bruises/bleeds easily.  Psychiatric/Behavioral: The patient is nervous/anxious.   All other systems reviewed and are negative.    Blood pressure 134/81, pulse 74, temperature 98.3 F (36.8 C), resp. rate 20, height 5' 3.4" (1.61 m), weight 203 lb 8 oz (92.307 kg).  Physical Exam Physical Exam  Constitutional: She appears well-developed and well-nourished.  Eyes: No scleral icterus.  Neck: Neck supple.  Cardiovascular: Normal rate, regular rhythm and normal heart sounds.   Pulmonary/Chest: Effort normal and breath sounds normal. She has no wheezes. She has no rales. Right breast exhibits no inverted nipple, no mass, no nipple discharge, no skin change and no tenderness. Left breast exhibits no inverted nipple, no mass, no nipple discharge, no skin change and no tenderness. Breasts are symmetrical.  Abdominal: Soft. There is no hepatomegaly.  Lymphadenopathy:    She has no cervical adenopathy.    She has no axillary adenopathy.    Assessment    Early stage right breast cancer, multicentric    Plan    She has a multicentric right breast cancer. This all appears to be early stage. We discussed the stage and pathophysiology of invasive ductal carcinoma. We discussed all the options for treatment including surgery, chemotherapy, radiation therapy, antiestrogen therapy, and Herceptin.  Specifically we discussed options for surgery from the breast tumors. I do not be she is a candidate for lumpectomy given her multicentric disease. I recommended a simple mastectomy. We discussed drains. We discussed also an immediate reconstruction and I'm going to schedule her to see Dr. Kelly Splinter. She understands  the risks of the knee as well as the indications for a mastectomy given her multicentric disease.  We discussed also a sentinel lymph node biopsy at the time of her mastectomy. I told her that I would do intraoperative pathology and if this were positive for cancer cells in her lymph nodes I would proceed with an axillary lymph node dissection. If these are negative I told her there was a small chance this would end up positive on permanent pathology requiring her to go back to the operating room potentially for an axillary lymph node dissection. We discussed the  risks of both sentinel lymph node biopsy and axillary lymph node dissection over the long-term as well. She has been evaluated by physical therapy today as well.  In discussion at the multidisciplinary conference she will end up getting Herceptin based therapy due to the pathology her tumor. I will plan on placing a left-sided port at the time of her surgery. She has a port for LAP-BAND surgery so she understands what the port will appear to be. We discussed the risks of poor placement including bleeding, infection, pneumothorax.  I will wait until she sees Dr. Kelly Splinter and I will plan on scheduling her for surgery following that.    [name] 06/03/2011, 10:36 AM

## 2011-06-05 NOTE — Progress Notes (Signed)
This encounter was created in error - please disregard.

## 2011-06-11 ENCOUNTER — Telehealth (INDEPENDENT_AMBULATORY_CARE_PROVIDER_SITE_OTHER): Payer: Self-pay | Admitting: Surgery

## 2011-06-11 NOTE — Telephone Encounter (Signed)
06/11/11 Recall letter mailed to patient for bariatric surgery follow-up.  Adv pt to call to schedule appt...cef °

## 2011-06-12 ENCOUNTER — Other Ambulatory Visit (INDEPENDENT_AMBULATORY_CARE_PROVIDER_SITE_OTHER): Payer: Self-pay | Admitting: General Surgery

## 2011-06-12 DIAGNOSIS — C50919 Malignant neoplasm of unspecified site of unspecified female breast: Secondary | ICD-10-CM

## 2011-06-18 ENCOUNTER — Telehealth (INDEPENDENT_AMBULATORY_CARE_PROVIDER_SITE_OTHER): Payer: Self-pay | Admitting: General Surgery

## 2011-06-18 ENCOUNTER — Other Ambulatory Visit (INDEPENDENT_AMBULATORY_CARE_PROVIDER_SITE_OTHER): Payer: Self-pay | Admitting: General Surgery

## 2011-06-18 ENCOUNTER — Ambulatory Visit (HOSPITAL_COMMUNITY)
Admission: RE | Admit: 2011-06-18 | Discharge: 2011-06-18 | Disposition: A | Discharge status: Home / Self Care | Payer: BC Managed Care – PPO | Source: Ambulatory Visit | Attending: General Surgery | Admitting: General Surgery

## 2011-06-18 ENCOUNTER — Encounter (HOSPITAL_COMMUNITY)
Admission: RE | Admit: 2011-06-18 | Discharge: 2011-06-18 | Disposition: A | Discharge status: Home / Self Care | Payer: BC Managed Care – PPO | Source: Ambulatory Visit | Attending: General Surgery | Admitting: General Surgery

## 2011-06-18 DIAGNOSIS — Z01811 Encounter for preprocedural respiratory examination: Secondary | ICD-10-CM

## 2011-06-18 DIAGNOSIS — Z01818 Encounter for other preprocedural examination: Secondary | ICD-10-CM | POA: Insufficient documentation

## 2011-06-18 DIAGNOSIS — Z01810 Encounter for preprocedural cardiovascular examination: Secondary | ICD-10-CM | POA: Insufficient documentation

## 2011-06-18 DIAGNOSIS — Z01812 Encounter for preprocedural laboratory examination: Secondary | ICD-10-CM | POA: Insufficient documentation

## 2011-06-18 LAB — BASIC METABOLIC PANEL
CO2: 33 mEq/L — ABNORMAL HIGH (ref 19–32)
Calcium: 10.2 mg/dL (ref 8.4–10.5)
GFR calc non Af Amer: 89 mL/min — ABNORMAL LOW (ref >90)
Potassium: 3.5 mEq/L (ref 3.5–5.1)
Sodium: 138 mEq/L (ref 135–145)

## 2011-06-18 LAB — CBC
HCT: 40.7 % (ref 36.0–46.0)
Hemoglobin: 13.9 g/dL (ref 12.0–15.0)
MCH: 29.9 pg (ref 26.0–34.0)
MCHC: 34.2 g/dL (ref 30.0–36.0)
MCV: 87.5 fL (ref 78.0–100.0)
RDW: 13.5 % (ref 11.5–15.5)

## 2011-06-18 LAB — SURGICAL PCR SCREEN
MRSA, PCR: NEGATIVE
Staphylococcus aureus: POSITIVE — AB

## 2011-06-18 NOTE — Telephone Encounter (Signed)
Patient returned [name] call and stated she was okay without seeing Dr. Dwain Sarna prior to her surgery, she was appreciative for the concern but felt okay with everything.

## 2011-06-18 NOTE — Telephone Encounter (Signed)
I called patient and left message on machine for her to call me back. Per Dr Dwain Sarna need to determine whether she wants to come in and talk with him prior to her surgery on next Friday.

## 2011-06-26 ENCOUNTER — Other Ambulatory Visit (INDEPENDENT_AMBULATORY_CARE_PROVIDER_SITE_OTHER): Payer: Self-pay | Admitting: General Surgery

## 2011-06-26 ENCOUNTER — Inpatient Hospital Stay (HOSPITAL_COMMUNITY): Payer: BC Managed Care – PPO

## 2011-06-26 ENCOUNTER — Inpatient Hospital Stay (HOSPITAL_COMMUNITY)
Admission: RE | Admit: 2011-06-26 | Discharge: 2011-06-26 | Disposition: A | Discharge status: Home / Self Care | Payer: BC Managed Care – PPO | Source: Ambulatory Visit | Attending: General Surgery | Admitting: General Surgery

## 2011-06-26 ENCOUNTER — Inpatient Hospital Stay (HOSPITAL_COMMUNITY)
Admission: RE | Admit: 2011-06-26 | Discharge: 2011-06-27 | DRG: 258 | Disposition: A | Discharge status: Home / Self Care | Payer: BC Managed Care – PPO | Source: Ambulatory Visit | Attending: General Surgery | Admitting: General Surgery

## 2011-06-26 DIAGNOSIS — K219 Gastro-esophageal reflux disease without esophagitis: Secondary | ICD-10-CM | POA: Diagnosis present

## 2011-06-26 DIAGNOSIS — C50919 Malignant neoplasm of unspecified site of unspecified female breast: Principal | ICD-10-CM | POA: Diagnosis present

## 2011-06-26 DIAGNOSIS — Z170 Estrogen receptor positive status [ER+]: Secondary | ICD-10-CM

## 2011-06-26 DIAGNOSIS — E669 Obesity, unspecified: Secondary | ICD-10-CM | POA: Diagnosis present

## 2011-06-26 DIAGNOSIS — I10 Essential (primary) hypertension: Secondary | ICD-10-CM | POA: Diagnosis present

## 2011-06-26 DIAGNOSIS — Z01812 Encounter for preprocedural laboratory examination: Secondary | ICD-10-CM

## 2011-06-26 HISTORY — PX: OTHER SURGICAL HISTORY: SHX169

## 2011-06-26 MED ORDER — TECHNETIUM TC 99M SULFUR COLLOID FILTERED
1.0000 | Freq: Once | INTRAVENOUS | Status: AC | PRN
  Administered 2011-06-26: 1 via INTRADERMAL

## 2011-06-27 NOTE — Op Note (Signed)
NAMEMAKAILA, WINDLE NO.:  [id number]  MEDICAL RECORD NO.:  [id number]  LOCATION:  5126                         FACILITY:  [facility]  PHYSICIAN:  Wayland Denis, DO      DATE OF BIRTH:  [birth year]  DATE OF PROCEDURE:  06/26/2011 DATE OF DISCHARGE:                              OPERATIVE REPORT   PREOPERATIVE DIAGNOSIS:  Right breast cancer.  POSTOPERATIVE DIAGNOSIS:  Right breast cancer.  PROCEDURE:  Immediate reconstruction of right breast with expander and Flex HD placement.  Expander 450 mL medium height, medium profile mentor, and Flex HD 4 x 16.  ATTENDING SURGEON:  Wayland Denis, DO  ASSISTANT:  Juanetta Gosling, MD  ANESTHESIA:  General.  INDICATION FOR PROCEDURE:  The patient is a 65 year old female who was diagnosed with right breast cancer.  She agreed to undergo a right mastectomy with sentinel lymph node biopsy and immediate reconstruction. Consent was confirmed.  Risks and complications were reviewed.  DESCRIPTION OF PROCEDURE:  The patient taken to the operating room by General Surgery and underwent a right mastectomy with sentinel lymph node excision.  Once complete, the patient was rendered to the Plastic Surgery Service.  Time-out was called.  All information was confirmed to be correct.  The patient was redraped.  The pocket was irrigated with antibiotic solution and normal saline in order to clear the fatty debris.  Hemostasis was achieved using electrocautery.  Bovie was used to lift the pectoralis major muscle and create a pocket underneath. This was done at the inframammary fold as well as laterally working medially in order to release the muscle.  Once that was complete, the Flex HD was placed in the pocket.  The Flex HD was prepared according to the manufacture's guidelines.  The shiny side was down with the porous side up.  The Flex HD was tacked to the pectoralis major muscle and in the chest wall at the inframammary fold using  2-0 PDS simple interrupted and running suture was used.  Several pie crusts were placed in order to prevent fluid buildup underneath the pectoralis.  The 450 mL medium profile mentor expander was prepared according to the manufacture guidelines.  Air was evacuated.  The expander was soaked in antibiotic solution.  The expander was then placed underneath the pectoralis major muscle and 100 mL of injectable normal saline was injected in the expander.  The Flex HD was then tacked to the chest wall laterally using a 2-0 PDS.  The lateral skin flap was tacked to the chest wall with 3-0 Vicryl in order to decrease the dead space.  A 15-blade was used to make a stab incision laterally inferiorly at the inframammary fold.  A hemostat was placed and a 19-Blake drain was brought through intact to the skin with a 3-0 silk.  The drain was placed laterally.  The deep layers were then closed with 3-0 Vicryl and 4-0 Vicryl, 5-0 Monocryl was used to close the skin with a running subcuticular stitch. Dermabond was then applied.  The dressing with an ABD and 4x4s over the drain  site and a breast binder.  The patient tolerated the procedure well and there were no  complications.  She was awoken and taken to recovery room in stable condition.     Wayland Denis, DO     CS/MEDQ  D:  06/26/2011  T:  06/26/2011  Job:  [id number]  Electronically Signed by Wayland Denis  on 06/27/2011 05:48:29 PM

## 2011-06-28 NOTE — Op Note (Signed)
[name], WIATREK NO.:  [id number]  MEDICAL RECORD NO.:  [id number]  LOCATION:  2899                         FACILITY:  [facility]  PHYSICIAN:  Juanetta Gosling, MDDATE OF BIRTH:  02-12-1946  DATE OF PROCEDURE: DATE OF DISCHARGE:                              OPERATIVE REPORT   PREOPERATIVE DIAGNOSIS:  Multicentric right breast cancer, Her-2 positive, clinical stage I.  POSTOPERATIVE DIAGNOSIS:  Multicentric right breast cancer, Her-2 positive, clinical stage I.  PROCEDURES: 1. Left subclavian 8-French PowerPort insertion. 2. Right simple mastectomy. 3. Injection of blue dye for sentinel node identification. 4. Right axillary sentinel node biopsy.  SURGEON:  Juanetta Gosling, MD  ASSISTANT:  None.  ANESTHESIA:  General.  SUPERVISING ANESTHESIOLOGIST:  Germaine Pomfret, MD  SPECIMENS: 1. Right breast marked short superior long lateral. 2. Right axillary sentinel node biopsy with counts of 1347.  ESTIMATED BLOOD LOSS:  100 mL.  COMPLICATIONS:  None.  Case was turned over to Dr. Kelly Splinter upon completion.  INDICATIONS:  This is a 65 year old female with a family history of breast cancer who underwent a regular screening mammogram with 3 abnormal areas in the right breast.  They were all less than a cm size and they were all biopsied and were all invasive ductal carcinoma.  Two of these were Her-2 negative and one was Her-2 positive.  She underwent an MRI, which confirmed all of these.  We discussed all of her options and I recommended a right mastectomy for multicentric disease.  She was also recommended a Port-A-Cath due to the Her-2 positive lesion.  We then proceeded to the operating room.  PROCEDURE:  After informed consent was obtained, we took the patient to the operating room.  She was administered 1 g of intravenous cefazolin. Sequential compression devices were placed on her lower extremities prior to induction with anesthesia.   She was then placed under general endotracheal anesthesia without complication.  Foley catheter was placed.  We then tucked her arms and she was appropriately padded and then prepped and draped in a standard sterile surgical fashion. Surgical time-out was then performed.  I then accessed her subclavian vein on the first pass.  The wire was placed, this was confirmed by fluoroscopy.  I then made a pocket below this overlying the pectoralis fascia.  I then tunneled the line between the 2 areas.  Then I inserted the dilator under direct vision.  This was in good position.  I removed the wire assembly.  I then passed the line in a position and removed the peel-away sheath.  This was then brought back to the distal cava by fluoroscopy.  This was then attached to the port and sewn into place in 3 places with 2-0 Prolene suture.  The line flushed easily and aspirated blood.  Fluoro confirmed that the line was not kinked and was good position.  I then closed this with 3-0 Vicryl and 4-0 Monocryl.  I then accessed through the skin and packed this and flushed this with concentrated heparin.  I then placed Dermabond overlying this.   I then turned my attention to the right breast.  She was re-prepped and draped in the  standard sterile surgical fashion.  I then made a fairly large incision due to the fact that she had 3 tumors and I wanted to ensure I got removed all of the skin overlying a couple of these lesions.  I then made flaps to the clavicle, sternum, inframammary crease, and laterally to the latissimus dorsi.  The breast was then removed in its entirety.  I then located 1 sentinel node that was blue and the counts were as listed above.  There was no more background radioactivity after removal of this sentinel node.  This sentinel node on intraoperative pathology was negative as confirmed by Dr. Frederica Kuster.  Once I had obtained hemostasis and the breast was passed off the table, I then turned the  case over to Dr. Shella Spearing and I assisted her with the reconstruction.     Juanetta Gosling, MD     MCW/MEDQ  D:  06/26/2011  T:  06/26/2011  Job:  [id number]  cc:   Drue Second, M.D. [name], M.D. [name], M.D. Wayland Denis, DO  Electronically Signed by [name] MD on 06/28/2011 07:11:07 PM

## 2011-07-02 ENCOUNTER — Ambulatory Visit (INDEPENDENT_AMBULATORY_CARE_PROVIDER_SITE_OTHER): Payer: BC Managed Care – PPO | Admitting: General Surgery

## 2011-07-02 ENCOUNTER — Encounter (INDEPENDENT_AMBULATORY_CARE_PROVIDER_SITE_OTHER): Payer: Self-pay | Admitting: General Surgery

## 2011-07-02 VITALS — BP 118/68 | HR 70 | Temp 97.3°F | Resp 16 | Ht 64.0 in | Wt 219.0 lb

## 2011-07-02 DIAGNOSIS — Z09 Encounter for follow-up examination after completed treatment for conditions other than malignant neoplasm: Secondary | ICD-10-CM

## 2011-07-02 NOTE — Progress Notes (Signed)
Subjective:     Patient ID: [name], female   DOB: 24-Apr-1946, 65 y.o.   MRN: [id number]  HPI This is a 65 year old female who underwent a right simple mastectomy with expander placement as well as a sentinel node biopsy and a left-sided port placement last week. She is doing well without any significant complaints. She returned today and I looked at her wounds as well as went over her pathology. Her pathology showed a 1.9 cm high-grade poorly differentiated tumor. Her nodes are negative. This is a stage I breast cancer.  Review of Systems     Objective:   Physical Exam Left sided port incision clean without infection Right sided mastectomy incision without infection, drain with serous fluid    Assessment:     S/p right simple mastectomy, stage I right breast cancer    Plan:     Return 2 weeks to see me and Dr. Kelly Splinter next week Has appt to see Dr. Welton Flakes

## 2011-07-07 ENCOUNTER — Telehealth (INDEPENDENT_AMBULATORY_CARE_PROVIDER_SITE_OTHER): Payer: Self-pay | Admitting: General Surgery

## 2011-07-07 NOTE — Telephone Encounter (Signed)
Called pt to notify her of the status of the appt. Pt advised if Dr Leonie Green office doesn't call her this pm to call me back so we can make plans for the appt.Hulda Humphrey

## 2011-07-07 NOTE — Telephone Encounter (Signed)
Called Dr Leonie Green office to find out when pt has her next appt b/c she has drains in place from her mastectomy and expander surgery. I explained that the pt did come to see Dr Dwain Sarna for one recheck of the drains on 07-02-11 but the pt told us she didn't have another recheck until 07-14-11. I just wanted to know if they could move the pt's appt up earlier or did we need to see the pt back this wk before Dr Kelly Splinter again. Dara said she would check with Dr Kelly Splinter and if they needed to see her sooner they would call the pt. AHS

## 2011-07-14 ENCOUNTER — Ambulatory Visit (HOSPITAL_BASED_OUTPATIENT_CLINIC_OR_DEPARTMENT_OTHER): Payer: BC Managed Care – PPO | Admitting: Oncology

## 2011-07-14 ENCOUNTER — Other Ambulatory Visit: Payer: Self-pay | Admitting: Oncology

## 2011-07-14 DIAGNOSIS — Z9010 Acquired absence of unspecified breast and nipple: Secondary | ICD-10-CM

## 2011-07-14 DIAGNOSIS — C50219 Malignant neoplasm of upper-inner quadrant of unspecified female breast: Secondary | ICD-10-CM

## 2011-07-14 DIAGNOSIS — Z170 Estrogen receptor positive status [ER+]: Secondary | ICD-10-CM

## 2011-07-14 DIAGNOSIS — C50919 Malignant neoplasm of unspecified site of unspecified female breast: Secondary | ICD-10-CM

## 2011-07-14 LAB — CBC WITH DIFFERENTIAL/PLATELET
BASO%: 1.8 % (ref 0.0–2.0)
EOS%: 6 % (ref 0.0–7.0)
HGB: 13.5 g/dL (ref 11.6–15.9)
MCH: 29.1 pg (ref 25.1–34.0)
MCHC: 33.4 g/dL (ref 31.5–36.0)
RDW: 13.4 % (ref 11.2–14.5)
lymph#: 3.1 10*3/uL (ref 0.9–3.3)

## 2011-07-14 LAB — COMPREHENSIVE METABOLIC PANEL
ALT: 16 U/L (ref 0–35)
AST: 19 U/L (ref 0–37)
Albumin: 4 g/dL (ref 3.5–5.2)
Calcium: 9.9 mg/dL (ref 8.4–10.5)
Chloride: 100 mEq/L (ref 96–112)
Potassium: 4.6 mEq/L (ref 3.5–5.3)

## 2011-07-17 ENCOUNTER — Encounter: Payer: Self-pay | Admitting: *Deleted

## 2011-07-18 ENCOUNTER — Encounter: Payer: Self-pay | Admitting: Oncology

## 2011-07-18 ENCOUNTER — Other Ambulatory Visit: Payer: Self-pay | Admitting: Oncology

## 2011-07-18 DIAGNOSIS — C50919 Malignant neoplasm of unspecified site of unspecified female breast: Secondary | ICD-10-CM

## 2011-07-18 HISTORY — DX: Malignant neoplasm of unspecified site of unspecified female breast: C50.919

## 2011-07-20 NOTE — Progress Notes (Signed)
CC:   Juanetta Gosling, MD [name], M.D. [name], M.D.  DIAGNOSIS:  65 year old female with a new diagnosis of multicentric right breast cancer, which is HER2 positive, clinical stage I.  The patient is now status post right simple mastectomy with right axillary sentinel node biopsy and left subclavian PowerPort insertion on 06/26/2011.  INTERVAL HISTORY:  The patient is seen in followup today.  Overall, she seems to be doing well.  She is slowly recovering from her simple right breast mastectomy.  Her final pathology showed a 1.9 cm high-grade poorly-differentiated invasive ductal carcinoma.  There was no lymphovascular invasion identified.  The invasive tumor was 3 cm from the nearest margin.  There was associated high-grade ductal carcinoma in situ with comedonecrosis and calcifications.  Lesion #2 showed no evidence of malignancy.  Lesion #3 showed a fibroadenoma.  Lesion #4 had an intermediate-grade moderately-differentiated invasive ductal carcinoma measuring 0.5 cm, no LVI, and the invasive tumor was 4 cm from the nearest margin.  Lesion #4 was ER positive 100%, PR positive 100%, Ki-67 was 28%, and HER2/neu was negative with an amplification of 1.3. Lesion #1 was HER2 positive with amplification of 9.27. Postoperatively, the patient is doing quite well.  Her sentinel nodes, of note, were negative for tumor.  She denies any fevers, chills, night sweats, headaches, shortness of breath, chest pains, palpitations.  She has no myalgias or arthralgias.  No fevers or chills.  She is slowly healing from her surgery.  The remainder of the 10-point review of systems is scanned separately into the EMR.  PHYSICAL EXAMINATION:  General:  The patient is awake, alert, in no acute distress.  She appears well.  Vital Signs:  Temperature is 98.3, pulse 96, respirations 20, blood pressure 131/83, and weight is 222.6 pounds.  HEENT Exam:  EOMI.  PERRLA.  Sclerae are anicteric.   No conjunctival pallor.  Oral mucosa is moist.  Neck:  Supple.  Lungs: Clear to auscultation and percussion.  Cardiovascular:  Regular rate and rhythm.  No murmurs, gallops or rubs.  Abdomen:  Soft, nontender, nondistended.  Bowel sounds are present.  No HSM.  Extremities:  No edema. Neuro:  Nonfocal.  LABORATORY DATA:  WBC 8.2, hemoglobin 13.5, hematocrit 40.4, platelets 311,000.  Electrolytes and LFTs are all normal.  IMPRESSION AND PLAN: 87. 65 year old female with stage I, ER/PR positive, HER2/neu positive     invasive ductal carcinoma of the right breast.  She is status post     simple mastectomy for multifocal disease.  The final pathology     revealed a 1.9 cm invasive ductal carcinoma that was ER positive,     PR positive, HER2/neu positive with a ratio of 9.73.  She had     sentinel node biopsy done at the time of her mastectomy, and it was     negative for metastatic disease, making her a pathologic stage T1c     N0 M0, stage I breast cancer that is HER2/neu positive and ER/PR     positive. 2. Recommendation at this time is to proceed with systemic     chemotherapy with Herceptin.  She already has a Port-A-Cath in     place.  I have recommended that we do TCH, that is Taxotere,     carboplatin and Herceptin, given on a weekly basis.  She will need     an echocardiogram, and we will get this set up for her since there     is cardiac toxicity associated  with Herceptin treatment.  I have     also today set her up for a chemotherapy teaching class.  I have     also given her prescriptions for Zofran, Compazine, Decadron and     EMLA cream for her port. 3. I spent 45 minutes face-to-face time answering her questions,     counseling her, and in coordination of care.  She     understands the risks and benefits and the side effects of     chemotherapy.  We will proceed as soon as she has had her     echocardiogram, hopefully in the next week or  two.    ______________________________ Drue Second, M.D. KK/MEDQ  D:  07/18/2011  T:  07/20/2011  Job:  [id number]

## 2011-07-30 ENCOUNTER — Ambulatory Visit (HOSPITAL_COMMUNITY)
Admission: RE | Admit: 2011-07-30 | Discharge: 2011-07-30 | Disposition: A | Discharge status: Home / Self Care | Payer: BC Managed Care – PPO | Source: Ambulatory Visit | Attending: Oncology | Admitting: Oncology

## 2011-07-30 DIAGNOSIS — Z01818 Encounter for other preprocedural examination: Secondary | ICD-10-CM | POA: Insufficient documentation

## 2011-07-30 DIAGNOSIS — Z09 Encounter for follow-up examination after completed treatment for conditions other than malignant neoplasm: Secondary | ICD-10-CM

## 2011-07-30 DIAGNOSIS — E785 Hyperlipidemia, unspecified: Secondary | ICD-10-CM | POA: Insufficient documentation

## 2011-07-30 DIAGNOSIS — C50919 Malignant neoplasm of unspecified site of unspecified female breast: Secondary | ICD-10-CM | POA: Insufficient documentation

## 2011-07-30 DIAGNOSIS — I10 Essential (primary) hypertension: Secondary | ICD-10-CM | POA: Insufficient documentation

## 2011-07-30 NOTE — Progress Notes (Signed)
2D Echo Completed.  [name], Sonographer 07/30/11.11:10am

## 2011-07-31 ENCOUNTER — Telehealth: Payer: Self-pay | Admitting: Oncology

## 2011-07-31 ENCOUNTER — Telehealth: Payer: Self-pay | Admitting: *Deleted

## 2011-07-31 NOTE — Telephone Encounter (Signed)
Patient can get 21 ondansetron 8 mg per 30 days. I called [facility] [phone] per [facility] @ [facility] [phone].  Left message for patient to call back if 21 is not enough.

## 2011-07-31 NOTE — Telephone Encounter (Signed)
RECEIVED A FAX FROM [facility] CONCERNING A PRIOR AUTHORIZATION FOR ONDANSETRON. THIS REQUEST WAS PLACED IN THE MANAGED CARE BIN. 

## 2011-08-03 ENCOUNTER — Ambulatory Visit (HOSPITAL_BASED_OUTPATIENT_CLINIC_OR_DEPARTMENT_OTHER): Payer: BC Managed Care – PPO | Admitting: Oncology

## 2011-08-03 ENCOUNTER — Encounter: Payer: Self-pay | Admitting: Oncology

## 2011-08-03 ENCOUNTER — Telehealth: Payer: Self-pay | Admitting: Oncology

## 2011-08-03 ENCOUNTER — Ambulatory Visit (HOSPITAL_BASED_OUTPATIENT_CLINIC_OR_DEPARTMENT_OTHER): Payer: No Typology Code available for payment source

## 2011-08-03 ENCOUNTER — Other Ambulatory Visit: Payer: Self-pay | Admitting: Oncology

## 2011-08-03 ENCOUNTER — Other Ambulatory Visit (HOSPITAL_BASED_OUTPATIENT_CLINIC_OR_DEPARTMENT_OTHER): Payer: No Typology Code available for payment source | Admitting: Lab

## 2011-08-03 VITALS — BP 137/85 | HR 86 | Temp 97.7°F | Ht 63.5 in | Wt 222.9 lb

## 2011-08-03 VITALS — BP 121/75 | HR 87 | Temp 98.1°F

## 2011-08-03 DIAGNOSIS — C50219 Malignant neoplasm of upper-inner quadrant of unspecified female breast: Secondary | ICD-10-CM

## 2011-08-03 DIAGNOSIS — Z5111 Encounter for antineoplastic chemotherapy: Secondary | ICD-10-CM

## 2011-08-03 DIAGNOSIS — Z170 Estrogen receptor positive status [ER+]: Secondary | ICD-10-CM

## 2011-08-03 DIAGNOSIS — C50919 Malignant neoplasm of unspecified site of unspecified female breast: Secondary | ICD-10-CM

## 2011-08-03 LAB — CBC WITH DIFFERENTIAL/PLATELET
BASO%: 0 % (ref 0.0–2.0)
EOS%: 0 % (ref 0.0–7.0)
MCH: 29.4 pg (ref 25.1–34.0)
MCHC: 34.5 g/dL (ref 31.5–36.0)
NEUT%: 83.2 % — ABNORMAL HIGH (ref 38.4–76.8)
RDW: 12.9 % (ref 11.2–14.5)
lymph#: 1.5 10*3/uL (ref 0.9–3.3)

## 2011-08-03 LAB — COMPREHENSIVE METABOLIC PANEL
ALT: 21 U/L (ref 0–35)
AST: 23 U/L (ref 0–37)
Calcium: 10.2 mg/dL (ref 8.4–10.5)
Chloride: 100 mEq/L (ref 96–112)
Creatinine, Ser: 0.63 mg/dL (ref 0.50–1.10)

## 2011-08-03 MED ORDER — ACETAMINOPHEN 325 MG PO TABS
650.0000 mg | ORAL_TABLET | Freq: Once | ORAL | Status: AC
  Administered 2011-08-03: 650 mg via ORAL

## 2011-08-03 MED ORDER — SODIUM CHLORIDE 0.9 % IV SOLN
267.8000 mg | Freq: Once | INTRAVENOUS | Status: AC
  Administered 2011-08-03: 270 mg via INTRAVENOUS
  Filled 2011-08-03: qty 27

## 2011-08-03 MED ORDER — DOCETAXEL CHEMO INJECTION 160 MG/16ML
36.0000 mg/m2 | Freq: Once | INTRAVENOUS | Status: AC
  Administered 2011-08-03: 80 mg via INTRAVENOUS
  Filled 2011-08-03: qty 8

## 2011-08-03 MED ORDER — ONDANSETRON 16 MG/50ML IVPB (CHCC)
16.0000 mg | Freq: Once | INTRAVENOUS | Status: AC
Start: 2011-08-03 — End: 2011-08-03
  Administered 2011-08-03: 16 mg via INTRAVENOUS

## 2011-08-03 MED ORDER — DEXAMETHASONE SODIUM PHOSPHATE 4 MG/ML IJ SOLN
20.0000 mg | Freq: Once | INTRAMUSCULAR | Status: AC
  Administered 2011-08-03: 20 mg via INTRAVENOUS

## 2011-08-03 MED ORDER — DIPHENHYDRAMINE HCL 25 MG PO CAPS
50.0000 mg | ORAL_CAPSULE | Freq: Once | ORAL | Status: AC
  Administered 2011-08-03: 50 mg via ORAL

## 2011-08-03 MED ORDER — SODIUM CHLORIDE 0.9 % IV SOLN
Freq: Once | INTRAVENOUS | Status: AC
  Administered 2011-08-03: 12:00:00 via INTRAVENOUS

## 2011-08-03 MED ORDER — TRASTUZUMAB CHEMO INJECTION 440 MG
4.0000 mg/kg | Freq: Once | INTRAVENOUS | Status: AC
  Administered 2011-08-03: 399 mg via INTRAVENOUS
  Filled 2011-08-03: qty 19

## 2011-08-03 MED ORDER — HEPARIN SOD (PORK) LOCK FLUSH 100 UNIT/ML IV SOLN
500.0000 [IU] | Freq: Once | INTRAVENOUS | Status: AC | PRN
  Administered 2011-08-03: 500 [IU]
  Filled 2011-08-03: qty 5

## 2011-08-03 MED ORDER — SODIUM CHLORIDE 0.9 % IJ SOLN
10.0000 mL | INTRAMUSCULAR | Status: DC | PRN
  Administered 2011-08-03: 10 mL
  Filled 2011-08-03: qty 10

## 2011-08-03 NOTE — Progress Notes (Signed)
VSS. Pt has no s/s of reaction. Rate increased to 188 ml/r x 47 ml.

## 2011-08-03 NOTE — Progress Notes (Signed)
OFFICE PROGRESS NOTE  CC [name] M.D. [name] M.D.  [facility], MD [address] [place] [address]  DIAGNOSIS: 65 year old female with multicentric right breast cancer that was ER positive PR positive HER-2/neu positive clinical stage I.  PRIOR THERAPY:  #1 status post right simple mastectomy with right axillary sentinel node biopsy and left subclavian power port insertion on 06/26/2011.  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 Sentinel node biopsy done at the time of her mastectomy was negative for metastatic disease. Pathologic stage TI C. N0 M0, stage I breast cancer.  CURRENT THERAPY: Patient will begin weekly Taxotere carboplatinum and Herceptin. She is now receiving week 1 cycle 1.  INTERVAL HISTORY: [name] 65 y.o. female returns is seen in followup today overall she seems to be doing well. She denies any fevers chills night sweats headaches shortness of breath chest pains palpitations no nausea or vomiting. She has all of her anti-emetic prescriptions filled including Zofran Compazine and Decadron. Her Port-A-Cath site looks great she will put him a cream on it. She feels quite good about the whole thing. She is healing well. Remainder of the template review of systems is negative.  MEDICAL HISTORY: Past Medical History  Diagnosis Date  . Allergy   . Asthma   . Hypertension   . Hyperlipidemia   . Depression   . Fibromyalgia   . Arthritis     feet, knee and base of thumbs  . Osteopenia   . Carpal tunnel syndrome     right  . Breast cancer, stage 1 07/18/2011    ALLERGIES:   has no known allergies.  MEDICATIONS:  Current Outpatient Prescriptions  Medication Sig Dispense Refill  . atorvastatin (LIPITOR) 20 MG tablet Take 20 mg by mouth daily.        . calcium gluconate 500 MG tablet Take 500 mg by mouth daily.        .  cholecalciferol (VITAMIN D) 1000 UNITS tablet Take 2,000 Units by mouth daily.        . diphenhydrAMINE (SOMINEX) 25 MG tablet Take 25 mg by mouth at bedtime as needed.        . DULoxetine (CYMBALTA) 60 MG capsule Take 120 mg by mouth daily.       . fish oil-omega-3 fatty acids 1000 MG capsule Take 3 g by mouth daily.        Marland Kitchen gabapentin (NEURONTIN) 600 MG tablet Take 600 mg by mouth 3 (three) times daily.        Marland Kitchen lidocaine-prilocaine (EMLA) cream Apply 1 application topically as needed.        Marland Kitchen lisinopril-hydrochlorothiazide (PRINZIDE,ZESTORETIC) 20-25 MG per tablet Take 1 tablet by mouth daily.        Marland Kitchen loratadine (CLARITIN) 10 MG tablet Take 10 mg by mouth daily.        . magnesium 30 MG tablet Take 500 mg by mouth 2 (two) times daily.        . Multiple Vitamin (MULTIVITAMIN) capsule Take 1 capsule by mouth daily.        . ondansetron (ZOFRAN) 8 MG tablet Take 8 mg by mouth as directed.        Marland Kitchen Prochlorperazine Maleate (COMPAZINE PO) Take 10 mg by mouth.        . traMADol (ULTRAM) 50 MG tablet Take 50 mg by mouth as needed.  SURGICAL HISTORY:  Past Surgical History  Procedure Date  . Tonsillectomy 1950  . Tubal ligation 1978  . Abdominal hysterectomy 1981  . Right ear surgery 1983    tumor removed (?)  . Right breast lumpectomy 1986  . Ethmoidectomy 1991  . Knee arthroscopy 1996, 2001    left  . Facial plastic surgery 1996  . Bunionectomy 1998    left foot  . Total knee arthroplasty 2006    left  . Nasal sinus surgery 2001  . Lap band surgery 2010  . Right mastectomy 06-26-11    REVIEW OF SYSTEMS:  A comprehensive review of systems was negative.   PHYSICAL EXAMINATION: General appearance: alert, cooperative and no distress Neck: no adenopathy, no carotid bruit, no JVD, supple, symmetrical, trachea midline and thyroid not enlarged, symmetric, no tenderness/mass/nodules Lymph nodes: Cervical, supraclavicular, and axillary nodes normal. Resp: clear to auscultation  bilaterally and normal percussion bilaterally Back: symmetric, no curvature. ROM normal. No CVA tenderness. Cardio: regular rate and rhythm, S1, S2 normal, no murmur, click, rub or gallop GI: soft, non-tender; bowel sounds normal; no masses,  no organomegaly Extremities: extremities normal, atraumatic, no cyanosis or edema Neurologic: Grossly normal Left breast no masses nipple discharge no skin changes. Right mastectomy scar is healing well. There still is some scar tissue formation and some scabbing. But there is no signs of infection para  ECOG PERFORMANCE STATUS: 0 - Asymptomatic  Blood pressure 137/85, pulse 86, temperature 97.7 F (36.5 C), height 5' 3.5" (1.613 m), weight 222 lb 14.4 oz (101.107 kg).  LABORATORY DATA: Lab Results  Component Value Date   WBC 12.9* 08/03/2011   HGB 13.3 08/03/2011   HCT 38.5 08/03/2011   MCV 85.2 08/03/2011   PLT 263 08/03/2011      Chemistry      Component Value Date/Time   NA 137 07/14/2011 0855   K 4.6 07/14/2011 0855   CL 100 07/14/2011 0855   CO2 27 07/14/2011 0855   BUN 23 07/14/2011 0855   CREATININE 0.82 07/14/2011 0855      Component Value Date/Time   CALCIUM 9.9 07/14/2011 0855   ALKPHOS 77 07/14/2011 0855   AST 19 07/14/2011 0855   ALT 16 07/14/2011 0855   BILITOT 0.3 07/14/2011 0855       RADIOGRAPHIC STUDIES:  No results found.  ASSESSMENT: 65 year old female with  #1 stage I HER-2/neu positive breast cancer. Her final stage was 1.9 cm high-grade poorly differentiated invasive ductal carcinoma no LDI ER positive PR positive HER-2/neu positive. She underwent a mastectomy. No sentinel nodes were involved with disease.  #2 patient has a Port-A-Cath placed. She will begin her chemotherapy.   PLAN: Patient understands the risks and benefits of her chemotherapy. She has asked all questions which were answered. We will proceed with cycle 1 week 1 of Taxotere carboplatin and Herceptin. She has her antiemetics. She we  discussed skin management we also discussed ophthalmology management. We discussed the risks of Herceptin. She is scheduled to be seen by Dr. Gala Romney for followup as well so that we can continue to monitor her cardiac function while she is on Herceptin.   All questions were answered. The patient knows to call the clinic with any problems, questions or concerns. We can certainly see the patient much sooner if necessary.  I spent 20 minutes counseling the patient face to face. The total time spent in the appointment was 30 minutes.    Drue Second, MD Medical/Oncology [facility]  [phone] (beeper) [phone] (Office)  08/03/2011, 10:58 AM

## 2011-08-03 NOTE — Progress Notes (Signed)
Vss. Pt has no s/s of reaction. Rate increased to 250 ml/hr x 63 ml.

## 2011-08-03 NOTE — Progress Notes (Signed)
Taxotere started at 1320 at rate of 63 ml/hr x 16 ml.  At 1325- VSS. Pt has no s/s of reaction.

## 2011-08-03 NOTE — Progress Notes (Signed)
VS stable.  Pt has no s/s of reaction.

## 2011-08-03 NOTE — Progress Notes (Signed)
VS stable.  Pt has no s/s of reaction. Rate increased to 125 ml/hr x 31 ml.

## 2011-08-03 NOTE — Telephone Encounter (Signed)
called pts home lmovm for appt on 11/26 and to rtn call to confirm appt

## 2011-08-04 ENCOUNTER — Telehealth: Payer: Self-pay | Admitting: Oncology

## 2011-08-04 NOTE — Telephone Encounter (Signed)
rtn call to pt lmomv about appt times on 11/26

## 2011-08-10 ENCOUNTER — Ambulatory Visit (HOSPITAL_BASED_OUTPATIENT_CLINIC_OR_DEPARTMENT_OTHER): Payer: BC Managed Care – PPO

## 2011-08-10 ENCOUNTER — Ambulatory Visit (HOSPITAL_BASED_OUTPATIENT_CLINIC_OR_DEPARTMENT_OTHER): Payer: BC Managed Care – PPO | Admitting: Physician Assistant

## 2011-08-10 ENCOUNTER — Other Ambulatory Visit (HOSPITAL_BASED_OUTPATIENT_CLINIC_OR_DEPARTMENT_OTHER): Payer: BC Managed Care – PPO | Admitting: Lab

## 2011-08-10 VITALS — BP 117/68 | HR 106 | Temp 97.8°F | Ht 63.5 in | Wt 220.2 lb

## 2011-08-10 DIAGNOSIS — C50219 Malignant neoplasm of upper-inner quadrant of unspecified female breast: Secondary | ICD-10-CM

## 2011-08-10 DIAGNOSIS — Z170 Estrogen receptor positive status [ER+]: Secondary | ICD-10-CM

## 2011-08-10 DIAGNOSIS — Z5111 Encounter for antineoplastic chemotherapy: Secondary | ICD-10-CM

## 2011-08-10 DIAGNOSIS — C50919 Malignant neoplasm of unspecified site of unspecified female breast: Secondary | ICD-10-CM

## 2011-08-10 LAB — CBC WITH DIFFERENTIAL/PLATELET
BASO%: 0.2 % (ref 0.0–2.0)
EOS%: 0 % (ref 0.0–7.0)
HCT: 38.8 % (ref 34.8–46.6)
LYMPH%: 8.4 % — ABNORMAL LOW (ref 14.0–49.7)
MCH: 29.5 pg (ref 25.1–34.0)
MCHC: 34.8 g/dL (ref 31.5–36.0)
MCV: 84.7 fL (ref 79.5–101.0)
MONO%: 0.6 % (ref 0.0–14.0)
NEUT%: 90.8 % — ABNORMAL HIGH (ref 38.4–76.8)
Platelets: 300 10*3/uL (ref 145–400)
lymph#: 1 10*3/uL (ref 0.9–3.3)

## 2011-08-10 LAB — COMPREHENSIVE METABOLIC PANEL
ALT: 21 U/L (ref 0–35)
AST: 18 U/L (ref 0–37)
Alkaline Phosphatase: 68 U/L (ref 39–117)
Creatinine, Ser: 0.8 mg/dL (ref 0.50–1.10)
Total Bilirubin: 0.5 mg/dL (ref 0.3–1.2)

## 2011-08-10 MED ORDER — DEXTROSE 5 % IV SOLN
36.0000 mg/m2 | Freq: Once | INTRAVENOUS | Status: AC
  Administered 2011-08-10: 80 mg via INTRAVENOUS
  Filled 2011-08-10: qty 8

## 2011-08-10 MED ORDER — SODIUM CHLORIDE 0.9 % IJ SOLN
10.0000 mL | INTRAMUSCULAR | Status: DC | PRN
  Administered 2011-08-10: 10 mL
  Filled 2011-08-10: qty 10

## 2011-08-10 MED ORDER — ONDANSETRON 16 MG/50ML IVPB (CHCC)
16.0000 mg | Freq: Once | INTRAVENOUS | Status: AC
  Administered 2011-08-10: 16 mg via INTRAVENOUS

## 2011-08-10 MED ORDER — ACETAMINOPHEN 325 MG PO TABS
650.0000 mg | ORAL_TABLET | Freq: Once | ORAL | Status: AC
  Administered 2011-08-10: 650 mg via ORAL

## 2011-08-10 MED ORDER — SODIUM CHLORIDE 0.9 % IV SOLN
267.8000 mg | Freq: Once | INTRAVENOUS | Status: AC
  Administered 2011-08-10: 270 mg via INTRAVENOUS
  Filled 2011-08-10: qty 27

## 2011-08-10 MED ORDER — HEPARIN SOD (PORK) LOCK FLUSH 100 UNIT/ML IV SOLN
500.0000 [IU] | Freq: Once | INTRAVENOUS | Status: AC | PRN
  Administered 2011-08-10: 500 [IU]
  Filled 2011-08-10: qty 5

## 2011-08-10 MED ORDER — DIPHENHYDRAMINE HCL 25 MG PO CAPS
50.0000 mg | ORAL_CAPSULE | Freq: Once | ORAL | Status: AC
  Administered 2011-08-10: 50 mg via ORAL

## 2011-08-10 MED ORDER — TRASTUZUMAB CHEMO INJECTION 440 MG
2.0000 mg/kg | Freq: Once | INTRAVENOUS | Status: AC
  Administered 2011-08-10: 210 mg via INTRAVENOUS
  Filled 2011-08-10: qty 10

## 2011-08-10 MED ORDER — SODIUM CHLORIDE 0.9 % IV SOLN
Freq: Once | INTRAVENOUS | Status: AC
  Administered 2011-08-10: 15:00:00 via INTRAVENOUS

## 2011-08-10 MED ORDER — DEXAMETHASONE SODIUM PHOSPHATE 4 MG/ML IJ SOLN
20.0000 mg | Freq: Once | INTRAMUSCULAR | Status: AC
  Administered 2011-08-10: 20 mg via INTRAVENOUS

## 2011-08-10 NOTE — Progress Notes (Signed)
Progress note dictated-CTS 

## 2011-08-10 NOTE — Progress Notes (Signed)
CC:   [name], M.D. [employer], DO  DIAGNOSES:  Janet 65 year old [place], [place], Chandler with Janet history of multicentric ER positive, PR positive, HER2 positive right breast carcinoma clinical stage I status post right simple mastectomy with axillary node dissection and left subclavian PowerPort insertion on 06/26/2011.  Final path revealing Janet 1.9 cm invasive ductal carcinoma, ER/PR positive, HER2 positive with Janet ratio of 9.73.  Pathologic staging of T1c N0 M0.  CURRENT THERAPY:  For week 2 of 12 planned weekly Taxotere, carboplatin and Herceptin.  SUBJECTIVE:  [name] is seen today for followup prior to week 2 of 12 planned [facility].  She is feeling well.  She denies any specific complaints, i.e., no fevers, chills, night sweats, shortness of breath, chest pain.  No nausea, emesis, diarrhea or constipation problems per se.  No frank neuropathy symptoms.  No excessive eye tearing.  Her energy level is fairly good.  REVIEW OF SYSTEMS:  Negative.  ALLERGIES:  No known drug allergies.  CURRENT MEDICATIONS:  Reviewed with the patient, as per EMR.  ECOG STATUS:  One.  PHYSICAL EXAMINATION:  Vital signs:  Blood pressure is 117/68, pulse 106, respirations 20, temp 97.8, weight 220 pounds.  HEENT: Conjunctivae pink.  Sclerae anicteric.  Oropharynx is benign without any evidence of oral mucositis or candidiasis.  Lungs:  Clear to auscultation.  No wheezing or rhonchi.  Heart:  Regular rate and rhythm without murmurs, rubs, gallops or clicks.  Abdomen:  Soft, nontender. No evidence of frank organomegaly.  Normal bowel sounds.  Extremities: Free of pedal edema.  Neurologic:  Nonfocal.  The patient is alert and oriented times 3.  LABORATORY DATA:  Hemoglobin 13.5 g, platelet count 300,000, WBC 12,300 with an ANC of 11,200.  Chemistry panel from last week shows Janet creatinine of 0.63.  IMPRESSION:  Janet 65 year old [place], [place], Chandler with Janet multicentric  ER/PR, HER2 positive right breast carcinoma status post right simple mastectomy with axillary node dissection revealing Janet 1.9 cm high-grade poorly differentiated invasive ductal carcinoma.  No sentinel nodes were involved.  She is now due for week 2 of 12 planned weekly Taxotere, carboplatin and Herceptin.  Case has been reviewed with Dr. Welton Flakes.  PLAN:  Proceed to treatment today as scheduled.  I will see [name] back in Janet week's time for followup prior to week 3 TCH.  She knows to contact us in the interim if the need should arise.    ______________________________ Sharyl Nimrod, PA CS/MEDQ  D:  08/10/2011  T:  08/10/2011  Job:  [id number]

## 2011-08-13 ENCOUNTER — Other Ambulatory Visit: Payer: Self-pay | Admitting: Oncology

## 2011-08-13 ENCOUNTER — Telehealth: Payer: Self-pay | Admitting: Oncology

## 2011-08-13 NOTE — Telephone Encounter (Signed)
pt called and asked that we move her lab and md appts to weds and her tx on thurs.  called pt back lmovm that all of her appts were moved except 12/06 and 12/07 because of lack of avail with MD

## 2011-08-20 ENCOUNTER — Ambulatory Visit (HOSPITAL_BASED_OUTPATIENT_CLINIC_OR_DEPARTMENT_OTHER): Payer: BC Managed Care – PPO | Admitting: Physician Assistant

## 2011-08-20 ENCOUNTER — Other Ambulatory Visit: Payer: BC Managed Care – PPO | Admitting: Lab

## 2011-08-20 VITALS — BP 118/73 | HR 112 | Temp 97.6°F | Ht 63.5 in | Wt 215.4 lb

## 2011-08-20 DIAGNOSIS — Z9010 Acquired absence of unspecified breast and nipple: Secondary | ICD-10-CM

## 2011-08-20 DIAGNOSIS — R197 Diarrhea, unspecified: Secondary | ICD-10-CM

## 2011-08-20 DIAGNOSIS — C50219 Malignant neoplasm of upper-inner quadrant of unspecified female breast: Secondary | ICD-10-CM

## 2011-08-20 DIAGNOSIS — C50919 Malignant neoplasm of unspecified site of unspecified female breast: Secondary | ICD-10-CM

## 2011-08-20 DIAGNOSIS — Z170 Estrogen receptor positive status [ER+]: Secondary | ICD-10-CM

## 2011-08-20 LAB — COMPREHENSIVE METABOLIC PANEL
ALT: 25 U/L (ref 0–35)
AST: 22 U/L (ref 0–37)
Alkaline Phosphatase: 63 U/L (ref 39–117)
BUN: 17 mg/dL (ref 6–23)
Calcium: 9.8 mg/dL (ref 8.4–10.5)
Chloride: 98 mEq/L (ref 96–112)
Creatinine, Ser: 0.62 mg/dL (ref 0.50–1.10)
Total Bilirubin: 0.3 mg/dL (ref 0.3–1.2)

## 2011-08-20 LAB — CBC WITH DIFFERENTIAL/PLATELET
BASO%: 0.5 % (ref 0.0–2.0)
EOS%: 0 % (ref 0.0–7.0)
HCT: 37.2 % (ref 34.8–46.6)
MCH: 30.2 pg (ref 25.1–34.0)
MCHC: 34.5 g/dL (ref 31.5–36.0)
MCV: 87.6 fL (ref 79.5–101.0)
MONO%: 3.1 % (ref 0.0–14.0)
NEUT%: 72.7 % (ref 38.4–76.8)
lymph#: 0.5 10*3/uL — ABNORMAL LOW (ref 0.9–3.3)

## 2011-08-20 NOTE — Progress Notes (Signed)
Progress note dictated-CTS 

## 2011-08-20 NOTE — Progress Notes (Signed)
CC:   Janet Denis, DO [name], M.D.  DIAGNOSIS:  A 65 year old [place], [place], woman with history of multicentric ER/PR, HER2-positive right breast carcinoma, status post right simple mastectomy with axillary node dissection with final path revealing 1.9-cm invasive ductal carcinoma with a Cage ratio of 9.73, pathologic staging of T1c N0.  CURRENT THERAPY:  For week 3 of 12 planned weekly Taxotere, carboplatin, and Herceptin.  SUBJECTIVE:  [name] is seen today in anticipation of week 3 of 12 planned weekly Taxotere, carboplatin, and Herceptin on 08/21/2011.  She is not feeling well today.  She notes that over the past weekend she did develop diarrheal stools which were initially intermittent, she did take Imodium which had them resolve, but she states resumed again today.  She denies any frank nausea.  She is trying to stay well-hydrated but admits that her sense of taste is definitely off.  No fevers or chills.  No shortness of breath.  No chest pain.  She is a bit fatigued.  No bleeding or bruising symptoms.  No neuropathy symptoms.  She has started premedication with dexamethasone in anticipation of her Taxotere component tomorrow.  REVIEW OF SYSTEMS:  Otherwise negative.  ALLERGIES:  No known drug allergies.  CURRENT MEDICATIONS:  Reviewed with the patient, as per EMR.  ECOG status of 1.  EXAM:  Vital signs: Blood pressure is 118/73, pulse 112 respirations 20, temp 97.6, and weight 215 pounds. HEENT: Conjunctivae pink.  Sclerae anicteric.  Oropharynx is benign without mucositis or candidiasis. Lungs are clear to auscultation.  No wheezing or rhonchi. Heart: Regular rate and rhythm.  Abdomen is soft, nontender.  Normal bowel sounds. Extremities are free of pedal edema.  Neurologic exam is nonfocal.  LABORATORY DATA:  At the time of this dictation, hemoglobin is 12.8 g, platelet count 285,000, and WBC 2100 with an ANC of 1500. CMET  is pending.  IMPRESSION: 71. A 65 year old [place], [place], woman with a multicentric     ER/PR, HER2-positive right breast carcinoma, status post right     simple mastectomy with axillary node dissection revealing a 1.9-cm     high-grade, poorly differentiated, invasive ductal carcinoma,     without sentinel node involvement due for week 3 of 12 planned     weekly Taxotere, carboplatin, and Herceptin on 08/21/2011. 2. Increased gut motility, with intermittent diarrhea suggesting     possible gastroenteritis versus effects from Taxotere. Case to be     reviewed with Dr. Park Breed.  PLAN:  I will hold [name]'s Taxotere and carboplatin component tomorrow giving Herceptin alone.  We will need to watch her potassium drawn from the CMET today to see whether she needs IV fluid hydration with potassium replacement.  She understands and agrees with this plan.    ______________________________ Sharyl Nimrod, PA CS/MEDQ  D:  08/20/2011  T:  08/20/2011  Job:  [id number]

## 2011-08-21 ENCOUNTER — Ambulatory Visit (HOSPITAL_BASED_OUTPATIENT_CLINIC_OR_DEPARTMENT_OTHER): Payer: BC Managed Care – PPO

## 2011-08-21 VITALS — BP 112/71 | HR 58 | Temp 97.3°F

## 2011-08-21 DIAGNOSIS — C50919 Malignant neoplasm of unspecified site of unspecified female breast: Secondary | ICD-10-CM

## 2011-08-21 DIAGNOSIS — C50219 Malignant neoplasm of upper-inner quadrant of unspecified female breast: Secondary | ICD-10-CM

## 2011-08-21 MED ORDER — SODIUM CHLORIDE 0.9 % IJ SOLN
10.0000 mL | INTRAMUSCULAR | Status: DC | PRN
  Administered 2011-08-21: 10 mL
  Filled 2011-08-21: qty 10

## 2011-08-21 MED ORDER — HEPARIN SOD (PORK) LOCK FLUSH 100 UNIT/ML IV SOLN
500.0000 [IU] | Freq: Once | INTRAVENOUS | Status: AC | PRN
  Administered 2011-08-21: 500 [IU]
  Filled 2011-08-21: qty 5

## 2011-08-21 MED ORDER — DIPHENHYDRAMINE HCL 25 MG PO CAPS: 50.0000 mg | ORAL_CAPSULE | Freq: Once | ORAL | Status: DC

## 2011-08-21 MED ORDER — TRASTUZUMAB CHEMO INJECTION 440 MG
2.0000 mg/kg | Freq: Once | INTRAVENOUS | Status: AC
  Administered 2011-08-21: 210 mg via INTRAVENOUS
  Filled 2011-08-21: qty 10

## 2011-08-21 MED ORDER — ACETAMINOPHEN 325 MG PO TABS: 650.0000 mg | ORAL_TABLET | Freq: Once | ORAL | Status: DC

## 2011-08-21 MED ORDER — SODIUM CHLORIDE 0.9 % IV SOLN: Freq: Once | INTRAVENOUS | Status: DC

## 2011-08-21 NOTE — Patient Instructions (Signed)
Patient discharged home with no complaints; has supply of medications for nausea and pain.

## 2011-08-24 ENCOUNTER — Ambulatory Visit (HOSPITAL_COMMUNITY)
Admission: RE | Admit: 2011-08-24 | Discharge: 2011-08-24 | Disposition: A | Discharge status: Home / Self Care | Payer: BC Managed Care – PPO | Source: Ambulatory Visit | Attending: Internal Medicine | Admitting: Internal Medicine

## 2011-08-24 ENCOUNTER — Other Ambulatory Visit: Payer: Self-pay

## 2011-08-24 ENCOUNTER — Encounter (HOSPITAL_COMMUNITY): Payer: Self-pay

## 2011-08-24 VITALS — BP 112/68 | HR 68 | Wt 220.5 lb

## 2011-08-24 DIAGNOSIS — C50919 Malignant neoplasm of unspecified site of unspecified female breast: Secondary | ICD-10-CM | POA: Insufficient documentation

## 2011-08-24 MED ORDER — LISINOPRIL-HYDROCHLOROTHIAZIDE 20-25 MG PO TABS: 0.5000 | ORAL_TABLET | Freq: Every day | ORAL | Status: DC

## 2011-08-24 NOTE — Progress Notes (Addendum)
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   Diagnosed with her breast CA in September 2012. In November, started Taxotere, carboplatinum and Herceptin. Has had 2 rounds of all 3 and this past week got Herceptin only to give her a break.   No h/o known cardiac history. Has h/o depression, HTN, HL and fibromyalgia.  Feels very fatigued. Has been on lisinopril/HCTZ  20/25 for a long time. States BP getting lower after starting chemo. No orthopnea or PND.   Echo in November which I reviewed EF 55-60% grade I diastolic dysfunction Lateral s' 11.4 cm/s   Review of Systems:     Cardiac Review of Systems: {Y] = yes [ ]  = no  Chest Pain [    ]  Resting SOB [   ] Exertional SOB  [  ]  Orthopnea [  ]   Pedal Edema [   ]    Palpitations [  ] Syncope  [  ]   Presyncope [   ]  General Review of Systems: [Y] = yes [  ]=no Constitional: recent weight change [  ]; anorexia [  ]; fatigue [ Y ]; nausea [  ]; night sweats [  ]; fever [  ]; or chills [  ];                                                                                                                                          Dental: poor dentition[  ];   Eye : blurred vision [  ]; diplopia [   ]; vision changes [  ];  Amaurosis fugax[  ]; Resp: cough [  ];  wheezing[  ];  hemoptysis[  ]; shortness of breath[  ]; paroxysmal nocturnal dyspnea[  ]; dyspnea on exertion[  ]; or orthopnea[  ];  GI:  gallstones[  ], vomiting[  ];  dysphagia[  ]; melena[  ];  hematochezia [  ]; heartburn[  ];   Hx of  Colonoscopy[  ]; GU: kidney stones [  ]; hematuria[  ];    dysuria [  ];  nocturia[  ];  history of     obstruction [  ];                 Skin: rash, swelling[  ];, hair loss[  ];  peripheral edema[  ];  or itching[  ]; Musculosketetal: myalgias[Y  ];  joint swelling[  ];  joint erythema[  ];  joint pain[Y  ];  back pain[  ];  Heme/Lymph: bruising[  ];  bleeding[  ];  anemia[  ];  Neuro: TIA[  ];  headaches[  ];  stroke[  ];  vertigo[  ];  seizures[  ];   paresthesias[  ];  difficulty walking[  ];  Psych:depression[Y  ]; anxiety[  ];  Endocrine: diabetes[  ];  thyroid dysfunction[  ];  Immunizations: Flu [  ]; Pneumococcal[  ];  Other:  Past Medical History  Diagnosis Date  . Allergy   . Asthma   . Hypertension   . Hyperlipidemia   . Depression   . Fibromyalgia   . Arthritis     feet, knee and base of thumbs  . Osteopenia   . Carpal tunnel syndrome     right  . Breast cancer, stage 1 07/18/2011    Medications Prior to Admission  Medication Sig Dispense Refill  . atorvastatin (LIPITOR) 20 MG tablet Take 20 mg by mouth daily.        . calcium gluconate 500 MG tablet Take 500 mg by mouth daily.        . cholecalciferol (VITAMIN D) 1000 UNITS tablet Take 2,000 Units by mouth daily.        . diphenhydrAMINE (SOMINEX) 25 MG tablet Take 25 mg by mouth at bedtime as needed.        . DULoxetine (CYMBALTA) 60 MG capsule Take 120 mg by mouth daily.       Marland Kitchen gabapentin (NEURONTIN) 600 MG tablet Take 600 mg by mouth daily.       Marland Kitchen lidocaine-prilocaine (EMLA) cream Apply 1 application topically as needed.        Marland Kitchen lisinopril-hydrochlorothiazide (PRINZIDE,ZESTORETIC) 20-25 MG per tablet Take 1 tablet by mouth daily.        Marland Kitchen loratadine (CLARITIN) 10 MG tablet Take 10 mg by mouth daily.        . magnesium 30 MG tablet Take 500 mg by mouth daily.       . Multiple Vitamin (MULTIVITAMIN) capsule Take 1 capsule by mouth daily.        . ondansetron (ZOFRAN) 8 MG tablet Take 8 mg by mouth as directed.        Marland Kitchen Prochlorperazine Maleate (COMPAZINE PO) Take 10  mg by mouth.        . traMADol (ULTRAM) 50 MG tablet Take 50 mg by mouth as needed.         No current facility-administered medications on file as of 08/24/2011.     No Known Allergies  History   Social History  . Marital Status: Single    Spouse Name: N/A    Number of Children: N/A  . Years of Education: N/A   Occupational History  . Not on file.   Social History Main Topics  . Smoking status: Former Games developer  . Smokeless tobacco: Not on file  . Alcohol Use: 0.6 oz/week    1 Glasses of wine per week  . Drug Use: Not on file  . Sexually Active: Not on file   Other Topics Concern  . Not on file   Social History Narrative  . No narrative on file    Family History  Problem Relation Age of Onset  . Breast cancer Paternal Aunt   . Breast cancer Paternal Grandmother     PHYSICAL EXAM: Filed Vitals:   08/24/11 1340  BP: 112/68  Pulse: 68   General:  Well appearing. No respiratory difficulty HEENT: normal Neck: supple. no JVD. Carotids 2+ bilat; no bruits. No lymphadenopathy or thryomegaly  appreciated. Cor: PMI nondisplaced. Mildly irregular. No rubs, gallops or murmurs. Lungs: clear Abdomen: soft, nontender, nondistended. No hepatosplenomegaly. No bruits or masses. Good bowel sounds. Extremities: no cyanosis, clubbing, rash, edema Neuro: alert & oriented x 3, cranial nerves grossly intact. moves all 4 extremities w/o difficulty. Affect pleasant.     ASSESSMENT/PLAN:

## 2011-08-24 NOTE — Patient Instructions (Signed)
Decrease Lisinopril/HCTZ to 1/2 tab daily  Your physician has requested that you have an echocardiogram. Echocardiography is a painless test that uses sound waves to create images of your heart. It provides your doctor with information about the size and shape of your heart and how well your heart's chambers and valves are working. This procedure takes approximately one hour. There are no restrictions for this procedure.  IN 2 MONTHS.  Your physician recommends that you schedule a follow-up appointment in: 2 months.

## 2011-08-26 ENCOUNTER — Ambulatory Visit (HOSPITAL_BASED_OUTPATIENT_CLINIC_OR_DEPARTMENT_OTHER): Payer: BC Managed Care – PPO | Admitting: Physician Assistant

## 2011-08-26 ENCOUNTER — Other Ambulatory Visit (HOSPITAL_BASED_OUTPATIENT_CLINIC_OR_DEPARTMENT_OTHER): Payer: BC Managed Care – PPO | Admitting: Lab

## 2011-08-26 DIAGNOSIS — R197 Diarrhea, unspecified: Secondary | ICD-10-CM

## 2011-08-26 DIAGNOSIS — C50919 Malignant neoplasm of unspecified site of unspecified female breast: Secondary | ICD-10-CM

## 2011-08-26 DIAGNOSIS — Z170 Estrogen receptor positive status [ER+]: Secondary | ICD-10-CM

## 2011-08-26 DIAGNOSIS — R5381 Other malaise: Secondary | ICD-10-CM

## 2011-08-26 DIAGNOSIS — C50219 Malignant neoplasm of upper-inner quadrant of unspecified female breast: Secondary | ICD-10-CM

## 2011-08-26 LAB — COMPREHENSIVE METABOLIC PANEL
ALT: 28 U/L (ref 0–35)
CO2: 28 mEq/L (ref 19–32)
Calcium: 9.5 mg/dL (ref 8.4–10.5)
Chloride: 100 mEq/L (ref 96–112)
Creatinine, Ser: 0.87 mg/dL (ref 0.50–1.10)
Glucose, Bld: 136 mg/dL — ABNORMAL HIGH (ref 70–99)
Total Protein: 6 g/dL (ref 6.0–8.3)

## 2011-08-26 LAB — CBC WITH DIFFERENTIAL/PLATELET
BASO%: 0 % (ref 0.0–2.0)
Eosinophils Absolute: 0 10*3/uL (ref 0.0–0.5)
HCT: 38.8 % (ref 34.8–46.6)
HGB: 13 g/dL (ref 11.6–15.9)
MCHC: 33.4 g/dL (ref 31.5–36.0)
MONO#: 0.1 10*3/uL (ref 0.1–0.9)
NEUT#: 9 10*3/uL — ABNORMAL HIGH (ref 1.5–6.5)
NEUT%: 90.1 % — ABNORMAL HIGH (ref 38.4–76.8)
WBC: 10 10*3/uL (ref 3.9–10.3)
lymph#: 0.9 10*3/uL (ref 0.9–3.3)

## 2011-08-26 NOTE — Progress Notes (Signed)
CC:   Wayland Denis, DO [name], M.D. Bevelyn Buckles. Bensimhon, MD  DIAGNOSIS:  A 65 year old [place], [place] woman with a multicentric ER/PR, HER-2 positive right breast carcinoma, status post right simple mastectomy with axillary node dissection.  Final pathology revealing a 1.9 cm infiltrating ductal carcinoma with a CISH ratio of 9.73, pathologic staging a T1c N0.  CURRENT THERAPY:  For week 4 of 12 planned weekly Taxotere, carboplatin and Herceptin, though week 3 carboplatin and Taxotere held due to increase GI symptoms.  SUBJECTIVE:  Ms. Ditmars was seen today in anticipation of week 4 of carboplatin, Taxotere and Herceptin after receiving Herceptin alone 1 week ago due to significant GI symptoms.  These symptoms have abated, but she still has quite a bit of fatigue though she admits the last couple of days things have improved a bit.  She was seen by Dr. Gala Romney on 08/24/2011.  He backed off her Zestoretic and is currently on half a dose daily.  She denies any fevers or chills.  No shortness of breath, chest pain.  No mouth sores.  No neuropathy symptoms.  She is premedicating with dexamethasone.  REVIEW OF SYSTEMS:  Negative.  ALLERGIES:  No known drug allergies.  CURRENT MEDICATIONS:  As per EMR.  ECOG STATUS:  1.  OBJECTIVE/PHYSICAL EXAM:  Vital signs:  Blood pressure is 123/76, pulse 96, respirations 20, temp 97.6, weight 223 pounds.  HEENT:  Conjunctivae pink.  Sclerae anicteric.  Oropharynx is benign without oral mucositis or candidosis.  Lungs:  Clear to auscultation.  No evidence of wheezing or rhonchi.  Heart:  Regular rate and rhythm without murmurs, rubs, gallops, or clicks.  Abdomen:  Soft, normal bowel sounds.  Extremities: Benign.  Neurologic:  Exam is nonfocal.  LABORATORY DATA:  Hemoglobin 13.0 g, platelet count 251,000, WBC 10,000 with an ANC of 9000.  IMPRESSION:  A 65 year old [place], [place] woman with multicentric  ER/PR, HER-2 positive right breast carcinoma, status post right simple mastectomy with axillary node dissection revealing 1.9 cm high-grade, poorly differentiated invasive ductal carcinoma without sentinel node involvement, due for week 4 of 12 planned weekly Taxotere, carboplatin and Herceptin after Herceptin alone 1 week ago due to increased diarrhea stools which were suggesting gastroenteritis versus effects from Taxotere component.  Her symptoms have improved.  Case to be reviewed with Dr. Park Breed.  PLAN:  We anticipate Ms. Hesketh will receive her next dose of Taxotere, carboplatin and Herceptin tomorrow.  Her Benadryl will be dose- reduced to 25 mg.  We will see her back in 1 week's time prior to week 5.  She understands and agrees with this plan.    ______________________________ Sharyl Nimrod, PA CS/MEDQ  D:  08/26/2011  T:  08/26/2011  Job:  [id number]

## 2011-08-26 NOTE — Progress Notes (Signed)
Progress note dictated-CTS 

## 2011-08-27 ENCOUNTER — Other Ambulatory Visit: Payer: No Typology Code available for payment source | Admitting: Lab

## 2011-08-27 ENCOUNTER — Other Ambulatory Visit: Payer: Self-pay | Admitting: Oncology

## 2011-08-27 ENCOUNTER — Ambulatory Visit (HOSPITAL_BASED_OUTPATIENT_CLINIC_OR_DEPARTMENT_OTHER): Payer: BC Managed Care – PPO

## 2011-08-27 ENCOUNTER — Ambulatory Visit: Payer: No Typology Code available for payment source | Admitting: Physician Assistant

## 2011-08-27 DIAGNOSIS — C50919 Malignant neoplasm of unspecified site of unspecified female breast: Secondary | ICD-10-CM

## 2011-08-27 DIAGNOSIS — C50219 Malignant neoplasm of upper-inner quadrant of unspecified female breast: Secondary | ICD-10-CM

## 2011-08-27 DIAGNOSIS — Z5112 Encounter for antineoplastic immunotherapy: Secondary | ICD-10-CM

## 2011-08-27 DIAGNOSIS — Z5111 Encounter for antineoplastic chemotherapy: Secondary | ICD-10-CM

## 2011-08-27 DIAGNOSIS — Z170 Estrogen receptor positive status [ER+]: Secondary | ICD-10-CM

## 2011-08-27 MED ORDER — DOCETAXEL CHEMO INJECTION 160 MG/16ML
36.0000 mg/m2 | Freq: Once | INTRAVENOUS | Status: AC
  Administered 2011-08-27: 80 mg via INTRAVENOUS
  Filled 2011-08-27: qty 8

## 2011-08-27 MED ORDER — SODIUM CHLORIDE 0.9 % IV SOLN
267.8000 mg | Freq: Once | INTRAVENOUS | Status: AC
  Administered 2011-08-27: 270 mg via INTRAVENOUS
  Filled 2011-08-27: qty 27

## 2011-08-27 MED ORDER — SODIUM CHLORIDE 0.9 % IJ SOLN
10.0000 mL | INTRAMUSCULAR | Status: DC | PRN
  Administered 2011-08-27: 10 mL
  Filled 2011-08-27: qty 10

## 2011-08-27 MED ORDER — HEPARIN SOD (PORK) LOCK FLUSH 100 UNIT/ML IV SOLN
500.0000 [IU] | Freq: Once | INTRAVENOUS | Status: AC
  Administered 2011-08-27: 500 [IU]
  Filled 2011-08-27: qty 5

## 2011-08-27 MED ORDER — ONDANSETRON 16 MG/50ML IVPB (CHCC): 16.0000 mg | Freq: Once | INTRAVENOUS | Status: DC

## 2011-08-27 MED ORDER — DIPHENHYDRAMINE HCL 25 MG PO CAPS: 25.0000 mg | ORAL_CAPSULE | Freq: Once | ORAL | Status: DC

## 2011-08-27 MED ORDER — ACETAMINOPHEN 325 MG PO TABS: 650.0000 mg | ORAL_TABLET | Freq: Once | ORAL | Status: DC

## 2011-08-27 MED ORDER — DEXAMETHASONE SODIUM PHOSPHATE 4 MG/ML IJ SOLN: 20.0000 mg | Freq: Once | INTRAMUSCULAR | Status: DC

## 2011-08-27 MED ORDER — TRASTUZUMAB CHEMO INJECTION 440 MG
2.0000 mg/kg | Freq: Once | INTRAVENOUS | Status: AC
  Administered 2011-08-27: 210 mg via INTRAVENOUS
  Filled 2011-08-27: qty 10

## 2011-08-27 MED ORDER — SODIUM CHLORIDE 0.9 % IV SOLN: Freq: Once | INTRAVENOUS | Status: DC

## 2011-08-28 ENCOUNTER — Ambulatory Visit: Payer: No Typology Code available for payment source

## 2011-08-29 NOTE — Assessment & Plan Note (Addendum)
Discussed with patient role of cardio-oncology clinic. Reviewed her echo with her. Currently no evidence of cardio-toxicity. Continue Herceptin. Will follow with echos every 3 months. Given fatigue and relative hypotension will decrease lisinopril/hctz.  Total MD time spent = 30 mins with over 70% of that time dedicated to counseling and discussions described above.

## 2011-09-02 ENCOUNTER — Other Ambulatory Visit: Payer: Self-pay | Admitting: Oncology

## 2011-09-02 ENCOUNTER — Other Ambulatory Visit: Payer: BC Managed Care – PPO | Admitting: Lab

## 2011-09-02 ENCOUNTER — Telehealth: Payer: Self-pay | Admitting: Oncology

## 2011-09-02 ENCOUNTER — Ambulatory Visit (HOSPITAL_BASED_OUTPATIENT_CLINIC_OR_DEPARTMENT_OTHER): Payer: BC Managed Care – PPO | Admitting: Physician Assistant

## 2011-09-02 VITALS — BP 122/79 | HR 96 | Temp 98.3°F | Ht 63.5 in | Wt 218.0 lb

## 2011-09-02 DIAGNOSIS — C50919 Malignant neoplasm of unspecified site of unspecified female breast: Secondary | ICD-10-CM

## 2011-09-02 DIAGNOSIS — Z09 Encounter for follow-up examination after completed treatment for conditions other than malignant neoplasm: Secondary | ICD-10-CM

## 2011-09-02 NOTE — Progress Notes (Signed)
Progress note dictated-CTS 

## 2011-09-02 NOTE — Progress Notes (Signed)
CC:   Janet Denis, DO [name], M.D.  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  The final pathology revealed 1.9 cm, infiltrating ductal carcinoma with CISH ratio of 9.3 and pathologic staging of T1c N0.  CURRENT THERAPY:  For week 5 of 12 planned weekly Taxotere, carboplatin, and Herceptin, though week 3 Taxotere and carboplatin were held due to increased GI symptoms.  SUBJECTIVE:  [name] is seen today in anticipation of week 5 of 12 planned weekly TCH on 09/04/2011.  She admits that since reintroducing the carboplatin and Taxotere, her GI symptoms have again increased.  She is not having frank diarrhea, but her stools are looser.  She also notes she is having quite a bit more joint sensitivity.  She actually wonders whether she can hold the drugs and get Herceptin alone tomorrow since her son is coming from out of town for the Christmas holiday. Otherwise, she denies any fevers, chills, shortness of breath, chest pain, nausea, or emesis issues.  She does have some underlying mild heartburn symptoms.  She has been premedicating with dexamethasone.  REVIEW OF SYSTEMS:  Negative.  ALLERGIES:  No known drug allergies.  CURRENT MEDICATIONS:  Reviewed with the patient, as per EMR.  PERFORMANCE STATUS:  ECOG status of 1.  PHYSICAL EXAMINATION:  Vital Signs:  Blood pressure is 122/79.  Pulse 96.  Respirations 20.  Temp 98.3.  Weight 218 pounds.  HEENT: Conjunctivae pink.  Sclerae anicteric.  Oropharynx is benign without oral mucositis or candidosis.  Lungs:  Clear to auscultation without wheezing or rhonchi.  Heart:  Regular rate and rhythm without murmurs, rubs, gallops, or clicks.  Abdomen:  Soft, nontender.  Normal bowel sounds are present.  Extremities:  Free of pedal edema.  Neurologic Exam:   Nonfocal.  LABORATORY DATA:  Actually, no labs were obtained today.  IMPRESSION:  A 65 year old [place], [place] woman with multicentric ER/PR and HER-2 positive right breast carcinoma, status post right simple mastectomy with axillary node dissection revealing a 1.9 cm, high-grade, poorly differentiated invasive ductal carcinoma without sentinel node involvement due for week 5 of 12 planned weekly Taxotere, carboplatin, and Herceptin with increased gastrointestinal symptoms again.  The case has been reviewed with Dr. Park Breed.  PLAN:  [name] will have Herceptin alone for the next 2 weeks.  We will regroup on 09/16/2010 with [name] prior to the possibility of reintroducing the Taxotere and Carboplatin component.  The patient understands agrees with this plan.    ______________________________ Janet Nimrod, PA CS/MEDQ  D:  09/02/2011  T:  09/02/2011  Job:  [id number]

## 2011-09-02 NOTE — Telephone Encounter (Signed)
Gv pt appt for dec-jan2013 ° °

## 2011-09-03 ENCOUNTER — Encounter: Payer: Self-pay | Admitting: Oncology

## 2011-09-03 ENCOUNTER — Other Ambulatory Visit: Payer: No Typology Code available for payment source | Admitting: Lab

## 2011-09-03 ENCOUNTER — Ambulatory Visit: Payer: No Typology Code available for payment source | Admitting: Physician Assistant

## 2011-09-03 ENCOUNTER — Ambulatory Visit (HOSPITAL_BASED_OUTPATIENT_CLINIC_OR_DEPARTMENT_OTHER): Payer: BC Managed Care – PPO

## 2011-09-03 VITALS — BP 131/82 | HR 99 | Temp 97.1°F

## 2011-09-03 DIAGNOSIS — C50919 Malignant neoplasm of unspecified site of unspecified female breast: Secondary | ICD-10-CM

## 2011-09-03 DIAGNOSIS — Z170 Estrogen receptor positive status [ER+]: Secondary | ICD-10-CM

## 2011-09-03 DIAGNOSIS — C50219 Malignant neoplasm of upper-inner quadrant of unspecified female breast: Secondary | ICD-10-CM

## 2011-09-03 DIAGNOSIS — Z5112 Encounter for antineoplastic immunotherapy: Secondary | ICD-10-CM

## 2011-09-03 MED ORDER — TRASTUZUMAB CHEMO INJECTION 440 MG
2.0000 mg/kg | Freq: Once | INTRAVENOUS | Status: AC
  Administered 2011-09-03: 210 mg via INTRAVENOUS
  Filled 2011-09-03: qty 10

## 2011-09-03 MED ORDER — HEPARIN SOD (PORK) LOCK FLUSH 100 UNIT/ML IV SOLN
500.0000 [IU] | Freq: Once | INTRAVENOUS | Status: AC | PRN
  Administered 2011-09-03: 500 [IU]
  Filled 2011-09-03: qty 5

## 2011-09-03 MED ORDER — SODIUM CHLORIDE 0.9 % IJ SOLN
10.0000 mL | INTRAMUSCULAR | Status: DC | PRN
  Administered 2011-09-03: 10 mL
  Filled 2011-09-03: qty 10

## 2011-09-03 MED ORDER — SODIUM CHLORIDE 0.9 % IV SOLN
Freq: Once | INTRAVENOUS | Status: AC
  Administered 2011-09-03: 14:00:00 via INTRAVENOUS

## 2011-09-03 NOTE — Progress Notes (Signed)
No premeds for herceptin per Debbora Presto PA

## 2011-09-04 ENCOUNTER — Ambulatory Visit: Payer: No Typology Code available for payment source

## 2011-09-10 ENCOUNTER — Ambulatory Visit: Payer: No Typology Code available for payment source

## 2011-09-10 ENCOUNTER — Other Ambulatory Visit: Payer: No Typology Code available for payment source | Admitting: Lab

## 2011-09-10 ENCOUNTER — Ambulatory Visit (HOSPITAL_BASED_OUTPATIENT_CLINIC_OR_DEPARTMENT_OTHER): Payer: BC Managed Care – PPO

## 2011-09-10 ENCOUNTER — Ambulatory Visit: Payer: No Typology Code available for payment source | Admitting: Physician Assistant

## 2011-09-10 VITALS — BP 107/72 | HR 84 | Temp 98.3°F

## 2011-09-10 DIAGNOSIS — C50919 Malignant neoplasm of unspecified site of unspecified female breast: Secondary | ICD-10-CM

## 2011-09-10 DIAGNOSIS — C50219 Malignant neoplasm of upper-inner quadrant of unspecified female breast: Secondary | ICD-10-CM

## 2011-09-10 DIAGNOSIS — Z170 Estrogen receptor positive status [ER+]: Secondary | ICD-10-CM

## 2011-09-10 DIAGNOSIS — Z5112 Encounter for antineoplastic immunotherapy: Secondary | ICD-10-CM

## 2011-09-10 MED ORDER — HEPARIN SOD (PORK) LOCK FLUSH 100 UNIT/ML IV SOLN
500.0000 [IU] | Freq: Once | INTRAVENOUS | Status: AC | PRN
  Administered 2011-09-10: 500 [IU]
  Filled 2011-09-10: qty 5

## 2011-09-10 MED ORDER — ACETAMINOPHEN 325 MG PO TABS: 650.0000 mg | ORAL_TABLET | Freq: Once | ORAL | Status: DC

## 2011-09-10 MED ORDER — DIPHENHYDRAMINE HCL 25 MG PO CAPS: 50.0000 mg | ORAL_CAPSULE | Freq: Once | ORAL | Status: DC

## 2011-09-10 MED ORDER — SODIUM CHLORIDE 0.9 % IJ SOLN
10.0000 mL | INTRAMUSCULAR | Status: DC | PRN
  Administered 2011-09-10: 10 mL
  Filled 2011-09-10: qty 10

## 2011-09-10 MED ORDER — TRASTUZUMAB CHEMO INJECTION 440 MG
2.0000 mg/kg | Freq: Once | INTRAVENOUS | Status: AC
  Administered 2011-09-10: 210 mg via INTRAVENOUS
  Filled 2011-09-10: qty 10

## 2011-09-10 MED ORDER — SODIUM CHLORIDE 0.9 % IV SOLN
Freq: Once | INTRAVENOUS | Status: AC
  Administered 2011-09-10: 13:00:00 via INTRAVENOUS

## 2011-09-11 ENCOUNTER — Ambulatory Visit: Payer: No Typology Code available for payment source

## 2011-09-16 ENCOUNTER — Ambulatory Visit: Payer: No Typology Code available for payment source | Admitting: Family

## 2011-09-16 ENCOUNTER — Other Ambulatory Visit: Payer: No Typology Code available for payment source

## 2011-09-17 ENCOUNTER — Other Ambulatory Visit (HOSPITAL_BASED_OUTPATIENT_CLINIC_OR_DEPARTMENT_OTHER): Payer: BC Managed Care – PPO

## 2011-09-17 ENCOUNTER — Ambulatory Visit (HOSPITAL_BASED_OUTPATIENT_CLINIC_OR_DEPARTMENT_OTHER): Payer: BC Managed Care – PPO | Admitting: Family

## 2011-09-17 ENCOUNTER — Ambulatory Visit (HOSPITAL_BASED_OUTPATIENT_CLINIC_OR_DEPARTMENT_OTHER): Payer: BC Managed Care – PPO

## 2011-09-17 VITALS — BP 121/78 | HR 89 | Temp 98.4°F | Ht 63.5 in | Wt 222.6 lb

## 2011-09-17 DIAGNOSIS — Z5111 Encounter for antineoplastic chemotherapy: Secondary | ICD-10-CM | POA: Diagnosis not present

## 2011-09-17 DIAGNOSIS — Z5112 Encounter for antineoplastic immunotherapy: Secondary | ICD-10-CM

## 2011-09-17 DIAGNOSIS — C50919 Malignant neoplasm of unspecified site of unspecified female breast: Secondary | ICD-10-CM

## 2011-09-17 DIAGNOSIS — Z170 Estrogen receptor positive status [ER+]: Secondary | ICD-10-CM

## 2011-09-17 DIAGNOSIS — C50219 Malignant neoplasm of upper-inner quadrant of unspecified female breast: Secondary | ICD-10-CM

## 2011-09-17 LAB — CBC WITH DIFFERENTIAL/PLATELET
BASO%: 0.8 % (ref 0.0–2.0)
HCT: 37 % (ref 34.8–46.6)
MCHC: 33.8 g/dL (ref 31.5–36.0)
MONO#: 0.8 10*3/uL (ref 0.1–0.9)
NEUT#: 4 10*3/uL (ref 1.5–6.5)
RBC: 4.25 10*6/uL (ref 3.70–5.45)
WBC: 7.4 10*3/uL (ref 3.9–10.3)
lymph#: 2.5 10*3/uL (ref 0.9–3.3)
nRBC: 0 % (ref 0–0)

## 2011-09-17 MED ORDER — DOCETAXEL CHEMO INJECTION 160 MG/16ML
36.0000 mg/m2 | Freq: Once | INTRAVENOUS | Status: AC
  Administered 2011-09-17: 80 mg via INTRAVENOUS
  Filled 2011-09-17: qty 8

## 2011-09-17 MED ORDER — HEPARIN SOD (PORK) LOCK FLUSH 100 UNIT/ML IV SOLN
500.0000 [IU] | Freq: Once | INTRAVENOUS | Status: AC | PRN
  Administered 2011-09-17: 500 [IU]
  Filled 2011-09-17: qty 5

## 2011-09-17 MED ORDER — SODIUM CHLORIDE 0.9 % IV SOLN
Freq: Once | INTRAVENOUS | Status: AC
  Administered 2011-09-17: 13:00:00 via INTRAVENOUS

## 2011-09-17 MED ORDER — ACETAMINOPHEN 325 MG PO TABS
650.0000 mg | ORAL_TABLET | Freq: Once | ORAL | Status: AC
  Administered 2011-09-17: 650 mg via ORAL

## 2011-09-17 MED ORDER — SODIUM CHLORIDE 0.9 % IJ SOLN
10.0000 mL | INTRAMUSCULAR | Status: DC | PRN
  Administered 2011-09-17: 10 mL
  Filled 2011-09-17: qty 10

## 2011-09-17 MED ORDER — SODIUM CHLORIDE 0.9 % IV SOLN
256.0000 mg | Freq: Once | INTRAVENOUS | Status: AC
  Administered 2011-09-17: 260 mg via INTRAVENOUS
  Filled 2011-09-17: qty 26

## 2011-09-17 MED ORDER — TRASTUZUMAB CHEMO INJECTION 440 MG
2.0000 mg/kg | Freq: Once | INTRAVENOUS | Status: AC
  Administered 2011-09-17: 210 mg via INTRAVENOUS
  Filled 2011-09-17: qty 10

## 2011-09-17 MED ORDER — DIPHENHYDRAMINE HCL 25 MG PO CAPS
50.0000 mg | ORAL_CAPSULE | Freq: Once | ORAL | Status: AC
  Administered 2011-09-17: 50 mg via ORAL

## 2011-09-17 MED ORDER — DEXAMETHASONE SODIUM PHOSPHATE 4 MG/ML IJ SOLN
20.0000 mg | Freq: Once | INTRAMUSCULAR | Status: AC
  Administered 2011-09-17: 20 mg via INTRAVENOUS

## 2011-09-17 MED ORDER — ONDANSETRON 16 MG/50ML IVPB (CHCC)
16.0000 mg | Freq: Once | INTRAVENOUS | Status: AC
  Administered 2011-09-17: 16 mg via INTRAVENOUS

## 2011-09-18 ENCOUNTER — Encounter: Payer: Self-pay | Admitting: Family

## 2011-09-18 NOTE — Progress Notes (Signed)
[facility]  Name: [name]                  DATE: 09/18/2011 MRN: [id number]                      DOB: 05-26-1946  CC: [name], MD          REFERRING PHYSICIAN: [name], MD  DIAGNOSIS: Patient Active Problem List  Diagnoses Date Noted  . Breast cancer, stage 1 07/18/2011  Multicentric, ER/PR and HER-2 positive right breast carcinoma, status  post right simple mastectomy with axillary node dissection. Final  pathology revealed 1.9 cm, infiltrating ductal carcinoma with CISH ratio  of 9.3 and pathologic staging of T1c N0.     CURRENT THERAPY: TC every 3 weeks with Herceptin weekly.   INTERIM HISTORY: Is tolerating chemotherapy well, chief complaint is of gastrointestinal symptoms, reflux. Is currently undergoing expansion of expander implants. We discussed plans for reconstruction, she is aware the left breast will require a mastopexy at the time of the permanent implant on the right. Had delayed healing of the mastectomy incision, sees plastic surgery tomorrow. Recently saw primary care who changed blood pressure medicine to lisinopril 10 mg with HCTZ 20 mg. This change was made after several episodes of postural hypotension.   PHYSICAL EXAM: BP 121/78  Pulse 89  Temp(Src) 98.4 F (36.9 C) (Oral)  Ht 5' 3.5" (1.613 m)  Wt 222 lb 9.6 oz (100.971 kg)  BMI 38.81 kg/m2 General: Well developed, well nourished, in no acute distress. Alone at today's visit. EENT: No ocular or oral lesions. No stomatitis.  Respiratory: Lungs are clear to auscultation bilaterally with normal respiratory movement and no accessory muscle use. Cardiac: No murmur, rub or tachycardia. No upper or lower extremity edema.  GI: Abdomen is soft, no palpable hepatosplenomegaly. No fluid wave. No tenderness. Musculoskeletal: No kyphosis, no tenderness over the spine, ribs or hips. Lymph: No cervical, infraclavicular, axillary or inguinal adenopathy. Neuro: No focal neurological  deficits. Psych: Alert and oriented X 3, appropriate mood and affect.  Breast Exam:In the supine position, with the right arm over the head, right mastectomy site with implant (expander). Lateral end of the incision, a 2 cm area of moist desquamation,no drainage or signs and symptoms of infection. No redness of the skin. No right axillary adenopathy. With the left arm over the head, the left nipple is everted. No periareolar edema or nipple discharge. No mass in any quadrant or subareolar region. No redness of the skin. No left axillary adenopathy.    SOCIAL HISTORY:   Social History  . 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 Smoking status: Former Games developer  . Alcohol Use: 0.6 oz/week    1 Glasses of wine per week     LABORATORY STUDIES:   Results for orders placed in visit on 09/17/11  CBC WITH DIFFERENTIAL      Component Value Range   WBC 7.4  3.9 - 10.3 (10e3/uL)   NEUT# 4.0  1.5 - 6.5 (10e3/uL)   HGB 12.5  11.6 - 15.9 (g/dL)   HCT 91.4  78.2 - 95.6 (%)   Platelets 220  145 - 400 (10e3/uL)   MCV 87.1  79.5 - 101.0 (fL)   MCH 29.4  25.1 - 34.0 (pg)   MCHC 33.8  31.5 - 36.0 (g/dL)   RBC 2.13  0.86 - 5.78 (  10e6/uL)   RDW 14.5  11.2 - 14.5 (%)   lymph# 2.5  0.9 - 3.3 (10e3/uL)   MONO# 0.8  0.1 - 0.9 (10e3/uL)   Eosinophils Absolute 0.0  0.0 - 0.5 (10e3/uL)   Basophils Absolute 0.1  0.0 - 0.1 (10e3/uL)   NEUT% 53.4  38.4 - 76.8 (%)   LYMPH% 34.1  14.0 - 49.7 (%)   MONO% 11.2  0.0 - 14.0 (%)   EOS% 0.5  0.0 - 7.0 (%)   BASO% 0.8  0.0 - 2.0 (%)   nRBC 0  0 - 0 (%)    IMPRESSION:  66 year old white female with: 1. Stage I right breast cancer, ER/PR positive, HER-2/neu positive, on treatment with good tolerance. 2. Delayed healing, right mastectomy incision. Followed by plastic surgery.  PLAN:   1. Treatment today. 2. Return to clinic to see me 09/23/11 prior to next treatment.

## 2011-09-23 ENCOUNTER — Ambulatory Visit (HOSPITAL_BASED_OUTPATIENT_CLINIC_OR_DEPARTMENT_OTHER): Payer: BC Managed Care – PPO

## 2011-09-23 ENCOUNTER — Other Ambulatory Visit (HOSPITAL_BASED_OUTPATIENT_CLINIC_OR_DEPARTMENT_OTHER): Payer: BC Managed Care – PPO | Admitting: Lab

## 2011-09-23 ENCOUNTER — Ambulatory Visit (HOSPITAL_BASED_OUTPATIENT_CLINIC_OR_DEPARTMENT_OTHER): Payer: BC Managed Care – PPO | Admitting: Family

## 2011-09-23 ENCOUNTER — Encounter: Payer: Self-pay | Admitting: Family

## 2011-09-23 DIAGNOSIS — C50919 Malignant neoplasm of unspecified site of unspecified female breast: Secondary | ICD-10-CM

## 2011-09-23 DIAGNOSIS — Z5112 Encounter for antineoplastic immunotherapy: Secondary | ICD-10-CM | POA: Diagnosis not present

## 2011-09-23 DIAGNOSIS — C50219 Malignant neoplasm of upper-inner quadrant of unspecified female breast: Secondary | ICD-10-CM

## 2011-09-23 DIAGNOSIS — Z170 Estrogen receptor positive status [ER+]: Secondary | ICD-10-CM | POA: Diagnosis not present

## 2011-09-23 DIAGNOSIS — I10 Essential (primary) hypertension: Secondary | ICD-10-CM

## 2011-09-23 LAB — COMPREHENSIVE METABOLIC PANEL
ALT: 24 U/L (ref 0–35)
Alkaline Phosphatase: 67 U/L (ref 39–117)
Creatinine, Ser: 0.79 mg/dL (ref 0.50–1.10)
Glucose, Bld: 103 mg/dL — ABNORMAL HIGH (ref 70–99)
Sodium: 135 mEq/L (ref 135–145)
Total Bilirubin: 0.5 mg/dL (ref 0.3–1.2)
Total Protein: 6.2 g/dL (ref 6.0–8.3)

## 2011-09-23 LAB — CBC WITH DIFFERENTIAL/PLATELET
BASO%: 0.8 % (ref 0.0–2.0)
HCT: 37.3 % (ref 34.8–46.6)
LYMPH%: 34.9 % (ref 14.0–49.7)
MCHC: 33.8 g/dL (ref 31.5–36.0)
MCV: 88 fL (ref 79.5–101.0)
MONO%: 3.7 % (ref 0.0–14.0)
NEUT%: 60.5 % (ref 38.4–76.8)
Platelets: 246 10*3/uL (ref 145–400)
RBC: 4.24 10*6/uL (ref 3.70–5.45)

## 2011-09-23 MED ORDER — HEPARIN SOD (PORK) LOCK FLUSH 100 UNIT/ML IV SOLN
500.0000 [IU] | Freq: Once | INTRAVENOUS | Status: AC | PRN
  Administered 2011-09-23: 500 [IU]
  Filled 2011-09-23: qty 5

## 2011-09-23 MED ORDER — ACETAMINOPHEN 325 MG PO TABS
650.0000 mg | ORAL_TABLET | Freq: Once | ORAL | Status: AC
  Administered 2011-09-23: 650 mg via ORAL

## 2011-09-23 MED ORDER — DIPHENHYDRAMINE HCL 25 MG PO CAPS
50.0000 mg | ORAL_CAPSULE | Freq: Once | ORAL | Status: AC
  Administered 2011-09-23: 25 mg via ORAL

## 2011-09-23 MED ORDER — TRASTUZUMAB CHEMO INJECTION 440 MG
2.0000 mg/kg | Freq: Once | INTRAVENOUS | Status: AC
  Administered 2011-09-23: 210 mg via INTRAVENOUS
  Filled 2011-09-23: qty 10

## 2011-09-23 MED ORDER — LISINOPRIL 10 MG PO TABS
10.0000 mg | ORAL_TABLET | Freq: Once | ORAL | Status: DC
  Filled 2011-09-23: qty 1

## 2011-09-23 MED ORDER — SODIUM CHLORIDE 0.9 % IJ SOLN
10.0000 mL | INTRAMUSCULAR | Status: DC | PRN
  Administered 2011-09-23 (×2): 10 mL
  Filled 2011-09-23: qty 10

## 2011-09-23 NOTE — Progress Notes (Signed)
[facility]  Name: [name]                  DATE: 09/23/2011 MRN: [id number]                      DOB: Oct 31, 1945  CC: [name], MD          REFERRING PHYSICIAN: [name], MD  DIAGNOSIS: Patient Active Problem List  Diagnoses Date Noted  . Breast cancer, stage 1 07/18/2011  Multicentric, ER/PR and HER-2 positive right breast carcinoma, status  post right simple mastectomy with axillary node dissection. Final  pathology revealed 1.9 cm, infiltrating ductal carcinoma with CISH ratio  of 9.3 and pathologic staging of T1c N0.     CURRENT THERAPY: TC every 3 weeks with Herceptin weekly.   INTERIM HISTORY: Saw plastic surgery earlier in the week, who was concerned about delayed healing of the right mastectomy site. She prescribed antibiotics. She did proceed with expansion, 100 cc of the right breast expander.    She continues to have several questions about the timing of her treatments. We have gone over the schedule several times, she is unclear why she only received 2 of the 3 planned cycles of the TC on the last cycle. I explained this was due to toxicity, but we will attempt to get her through the 3 planned rounds of this cycle. Has felt reasonably well for the last week, treatment today will be Herceptin alone.   PHYSICAL EXAM: BP 130/80  Pulse 87  Temp 98 F (36.7 C)  Ht 5' 3.5" (1.613 m)  Wt 220 lb 4.8 oz (99.927 kg)  BMI 38.41 kg/m2 General: Well developed, well nourished, in no acute distress. Alone at today's visit. EENT: No ocular or oral lesions. No stomatitis.  Respiratory: Lungs are clear to auscultation bilaterally with normal respiratory movement and no accessory muscle use. Cardiac: No murmur, rub or tachycardia. No upper or lower extremity edema.  GI: Abdomen is soft, no palpable hepatosplenomegaly. No fluid wave. No tenderness. Musculoskeletal: No kyphosis, no tenderness over the spine, ribs or hips. Lymph: No cervical,  infraclavicular, axillary or inguinal adenopathy. Neuro: No focal neurological deficits. Psych: Alert and oriented X 3, appropriate mood and affect.  Breast Exam:In the supine position, with the right arm over the head, right mastectomy site with implant (expander). Lateral end of the incision, a 2 cm area of moist desquamation, no drainage or signs and symptoms of infection. No redness of the skin. No right axillary adenopathy. With the left arm over the head, the left nipple is everted. No periareolar edema or nipple discharge. No mass in any quadrant or subareolar region. No redness of the skin. No left axillary adenopathy.    SOCIAL HISTORY:   . 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 Smoking status: Former Games developer  . Alcohol Use: 0.6 oz/week    1 Glasses of wine per week     LABORATORY STUDIES:   Results for orders placed in visit on 09/23/11  CBC WITH DIFFERENTIAL      Component Value Range   WBC 7.1  3.9 - 10.3 (10e3/uL)   NEUT# 4.3  1.5 - 6.5 (10e3/uL)   HGB 12.6  11.6 - 15.9 (g/dL)   HCT 62.1  30.8 - 65.7 (%)   Platelets 246  145 - 400 (10e3/uL)   MCV 88.0  79.5 -  101.0 (fL)   MCH 29.7  25.1 - 34.0 (pg)   MCHC 33.8  31.5 - 36.0 (g/dL)   RBC 9.56  2.13 - 0.86 (10e6/uL)   RDW 14.5  11.2 - 14.5 (%)   lymph# 2.5  0.9 - 3.3 (10e3/uL)   MONO# 0.3  0.1 - 0.9 (10e3/uL)   Eosinophils Absolute 0.0  0.0 - 0.5 (10e3/uL)   Basophils Absolute 0.1  0.0 - 0.1 (10e3/uL)   NEUT% 60.5  38.4 - 76.8 (%)   LYMPH% 34.9  14.0 - 49.7 (%)   MONO% 3.7  0.0 - 14.0 (%)   EOS% 0.1  0.0 - 7.0 (%)   BASO% 0.8  0.0 - 2.0 (%)  COMPREHENSIVE METABOLIC PANEL      Component Value Range   Sodium 135  135 - 145 (mEq/L)   Potassium 3.9  3.5 - 5.3 (mEq/L)   Chloride 99  96 - 112 (mEq/L)   CO2 20  19 - 32 (mEq/L)   Glucose, Bld 103 (*) 70 - 99 (mg/dL)   BUN 24 (*) 6 - 23 (mg/dL)   Creatinine, Ser 5.78  0.50 - 1.10 (mg/dL)    Total Bilirubin 0.5  0.3 - 1.2 (mg/dL)   Alkaline Phosphatase 67  39 - 117 (U/L)   AST 22  0 - 37 (U/L)   ALT 24  0 - 35 (U/L)   Total Protein 6.2  6.0 - 8.3 (g/dL)   Albumin 4.1  3.5 - 5.2 (g/dL)   Calcium 9.4  8.4 - 46.9 (mg/dL)    IMPRESSION:  66 year old white female with: 1. Stage I right breast cancer, ER/PR positive, HER-2/neu positive, on treatment with good tolerance. 2. Delayed healing, right mastectomy incision. Followed by plastic surgery.  PLAN:   1. Treatment today, Herceptin alone. 2. Return to clinic to see Dr. Welton Flakes 09/30/11 prior to next treatment.

## 2011-09-30 ENCOUNTER — Encounter: Payer: Self-pay | Admitting: Oncology

## 2011-09-30 ENCOUNTER — Ambulatory Visit: Payer: BC Managed Care – PPO | Admitting: Oncology

## 2011-09-30 ENCOUNTER — Ambulatory Visit (HOSPITAL_BASED_OUTPATIENT_CLINIC_OR_DEPARTMENT_OTHER): Payer: BC Managed Care – PPO

## 2011-09-30 ENCOUNTER — Ambulatory Visit: Payer: No Typology Code available for payment source | Admitting: Family

## 2011-09-30 ENCOUNTER — Other Ambulatory Visit: Payer: BC Managed Care – PPO | Admitting: Lab

## 2011-09-30 ENCOUNTER — Telehealth: Payer: Self-pay | Admitting: Oncology

## 2011-09-30 VITALS — BP 146/77 | HR 96 | Temp 98.3°F | Ht 63.5 in | Wt 221.3 lb

## 2011-09-30 DIAGNOSIS — C50919 Malignant neoplasm of unspecified site of unspecified female breast: Secondary | ICD-10-CM | POA: Diagnosis not present

## 2011-09-30 DIAGNOSIS — Z5111 Encounter for antineoplastic chemotherapy: Secondary | ICD-10-CM

## 2011-09-30 LAB — CBC WITH DIFFERENTIAL/PLATELET
Eosinophils Absolute: 0 10*3/uL (ref 0.0–0.5)
HCT: 34.8 % (ref 34.8–46.6)
LYMPH%: 15.6 % (ref 14.0–49.7)
MCV: 88.1 fL (ref 79.5–101.0)
MONO#: 0.9 10*3/uL (ref 0.1–0.9)
MONO%: 9.4 % (ref 0.0–14.0)
NEUT#: 7.1 10*3/uL — ABNORMAL HIGH (ref 1.5–6.5)
NEUT%: 74.5 % (ref 38.4–76.8)
Platelets: 236 10*3/uL (ref 145–400)
RBC: 3.95 10*6/uL (ref 3.70–5.45)
WBC: 9.5 10*3/uL (ref 3.9–10.3)

## 2011-09-30 LAB — COMPREHENSIVE METABOLIC PANEL
ALT: 25 U/L (ref 0–35)
CO2: 22 mEq/L (ref 19–32)
Calcium: 9.7 mg/dL (ref 8.4–10.5)
Chloride: 100 mEq/L (ref 96–112)
Creatinine, Ser: 0.57 mg/dL (ref 0.50–1.10)
Glucose, Bld: 117 mg/dL — ABNORMAL HIGH (ref 70–99)
Total Bilirubin: 0.4 mg/dL (ref 0.3–1.2)
Total Protein: 6.1 g/dL (ref 6.0–8.3)

## 2011-09-30 MED ORDER — TRASTUZUMAB CHEMO INJECTION 440 MG
2.0000 mg/kg | Freq: Once | INTRAVENOUS | Status: AC
  Administered 2011-09-30: 210 mg via INTRAVENOUS
  Filled 2011-09-30: qty 10

## 2011-09-30 MED ORDER — DIPHENHYDRAMINE HCL 25 MG PO CAPS
50.0000 mg | ORAL_CAPSULE | Freq: Once | ORAL | Status: AC
  Administered 2011-09-30: 50 mg via ORAL

## 2011-09-30 MED ORDER — SODIUM CHLORIDE 0.9 % IJ SOLN
10.0000 mL | INTRAMUSCULAR | Status: DC | PRN
  Administered 2011-09-30: 10 mL
  Filled 2011-09-30: qty 10

## 2011-09-30 MED ORDER — ACETAMINOPHEN 325 MG PO TABS
650.0000 mg | ORAL_TABLET | Freq: Once | ORAL | Status: AC
  Administered 2011-09-30: 650 mg via ORAL

## 2011-09-30 MED ORDER — SODIUM CHLORIDE 0.9 % IV SOLN
Freq: Once | INTRAVENOUS | Status: AC
  Administered 2011-09-30: 12:00:00 via INTRAVENOUS

## 2011-09-30 MED ORDER — DOCETAXEL CHEMO INJECTION 160 MG/16ML
75.0000 mg/m2 | Freq: Once | INTRAVENOUS | Status: AC
  Administered 2011-09-30: 160 mg via INTRAVENOUS
  Filled 2011-09-30: qty 16

## 2011-09-30 MED ORDER — ONDANSETRON 16 MG/50ML IVPB (CHCC)
16.0000 mg | Freq: Once | INTRAVENOUS | Status: AC
  Administered 2011-09-30: 16 mg via INTRAVENOUS

## 2011-09-30 MED ORDER — DEXAMETHASONE SODIUM PHOSPHATE 4 MG/ML IJ SOLN
20.0000 mg | Freq: Once | INTRAMUSCULAR | Status: AC
  Administered 2011-09-30: 20 mg via INTRAVENOUS

## 2011-09-30 MED ORDER — HEPARIN SOD (PORK) LOCK FLUSH 100 UNIT/ML IV SOLN
500.0000 [IU] | Freq: Once | INTRAVENOUS | Status: AC | PRN
  Administered 2011-09-30: 500 [IU]
  Filled 2011-09-30: qty 5

## 2011-09-30 MED ORDER — CARBOPLATIN CHEMO INJECTION 600 MG/60ML
825.0000 mg | Freq: Once | INTRAVENOUS | Status: AC
  Administered 2011-09-30: 830 mg via INTRAVENOUS
  Filled 2011-09-30: qty 83

## 2011-09-30 NOTE — Telephone Encounter (Signed)
gve the pt her jan-July 2013 appt calendar °

## 2011-09-30 NOTE — Progress Notes (Signed)
OFFICE PROGRESS NOTE  CC [name] M.D. [name] M.D.  [facility], MD, MD [address], [address] [place] [address]  DIAGNOSIS: 66 year old female with multicentric right breast cancer that was ER positive PR positive HER-2/neu positive clinical stage I.  PRIOR THERAPY:  #1 status post right simple mastectomy with right axillary sentinel node biopsy and left subclavian power port insertion on 06/26/2011.  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 Sentinel node biopsy done at the time of her mastectomy was negative for metastatic disease. Pathologic stage TI C. N0 M0, stage I breast cancer.  #3 patient has been receiving weekly Taxotere carboplatinum and Herceptin beginning November 2012. However she has had considerable side effects requiring discontinuation/holding of the chemotherapy.  CURRENT THERAPY:she will begin Taxotere carboplatinum given every 21 days with weekly Herceptin starting 09/30/2011   INTERVAL HISTORY: [name] 66 y.o. female followup visit today for her scheduled chemotherapy. Today she was scheduled to get her weekly Taxotere carboplatinum Herceptin combination with 3 weeks on with [facility] followed by 1 week of Herceptin only. However after reviewing her side effects she has had significant problems with the weekly combination of TCH. Upon reviewing her chart we've had significant delays in being able to get the treatment into her. I do think that this combination especially the weekly regimen is significantly toxic to her. The patient and I have discussed this at great length today. She is also quite concerned about continuing this combination and this dosing and schedule. Today she does feel well last week she received Herceptin only. She tolerates Herceptin very well. She denies any fevers chills night sweats headaches shortness of breath chest pains  palpitations she has no myalgias or arthralgias currently. Although she does suffer chronic weight from myalgias and arthralgias. She is denying any peripheral paresthesias today. Remainder of the 10 point review of systems is negative. MEDICAL HISTORY: Past Medical History  Diagnosis Date  . Allergy   . Asthma   . Hypertension   . Hyperlipidemia   . Depression   . Fibromyalgia   . Arthritis     feet, knee and base of thumbs  . Osteopenia   . Carpal tunnel syndrome     right  . Breast cancer, stage 1 07/18/2011    ALLERGIES:   has no known allergies.  MEDICATIONS:  Current Outpatient Prescriptions  Medication Sig Dispense Refill  . atorvastatin (LIPITOR) 20 MG tablet Take 20 mg by mouth daily.        . calcium gluconate 500 MG tablet Take 500 mg by mouth daily.        . cholecalciferol (VITAMIN D) 1000 UNITS tablet Take 2,000 Units by mouth daily.        . diphenhydrAMINE (SOMINEX) 25 MG tablet Take 25 mg by mouth at bedtime as needed.        . DULoxetine (CYMBALTA) 60 MG capsule Take 120 mg by mouth daily.       [employer] gabapentin (NEURONTIN) 600 MG tablet Take 600 mg by mouth daily.       . hydrochlorothiazide (HYDRODIURIL) 25 MG tablet Take 25 mg by mouth daily.      Boris Lown Oil 300 MG CAPS Take 1 capsule by mouth daily.        [employer] lidocaine-prilocaine (EMLA) cream Apply 1 application topically as needed.        [employer] lisinopril (PRINIVIL,ZESTRIL) 10 MG  tablet Take 10 mg by mouth daily.      [employer] loratadine (CLARITIN) 10 MG tablet Take 10 mg by mouth daily.        . magnesium 30 MG tablet Take 500 mg by mouth daily.       . Multiple Vitamin (MULTIVITAMIN) capsule Take 1 capsule by mouth daily.        . ondansetron (ZOFRAN) 8 MG tablet Take 8 mg by mouth as directed.        [employer] Prochlorperazine Maleate (COMPAZINE PO) Take 10 mg by mouth.        . traMADol (ULTRAM) 50 MG tablet Take 50 mg by mouth as needed.         No current facility-administered medications for this visit.    Facility-Administered Medications Ordered in Other Visits  Medication Dose Route Frequency Provider Last Rate Last Dose  . 0.9 %  sodium chloride infusion   Intravenous Once Victorino December, MD      . acetaminophen (TYLENOL) tablet 650 mg  650 mg Oral Once Victorino December, MD   650 mg at 09/30/11 1229  . CARBOplatin (PARAPLATIN) 830 mg in sodium chloride 0.9 % 500 mL chemo infusion  830 mg Intravenous Once Victorino December, MD   830 mg at 09/30/11 1435  . dexamethasone (DECADRON) injection 20 mg  20 mg Intravenous Once Victorino December, MD   20 mg at 09/30/11 1229  . diphenhydrAMINE (BENADRYL) capsule 50 mg  50 mg Oral Once Victorino December, MD   50 mg at 09/30/11 1229  . DOCEtaxel (TAXOTERE) 160 mg in dextrose 5 % 250 mL chemo infusion  75 mg/m2 (Treatment Plan Actual) Intravenous Once Victorino December, MD   160 mg at 09/30/11 1331  . heparin lock flush 100 unit/mL  500 Units Intracatheter Once PRN Victorino December, MD   500 Units at 09/30/11 1559  . ondansetron (ZOFRAN) IVPB 16 mg  16 mg Intravenous Once Victorino December, MD   16 mg at 09/30/11 1229  . sodium chloride 0.9 % injection 10 mL  10 mL Intracatheter PRN Victorino December, MD   10 mL at 09/30/11 1559  . trastuzumab (HERCEPTIN) 210 mg in sodium chloride 0.9 % 250 mL chemo infusion  2 mg/kg (Treatment Plan Actual) Intravenous Once Victorino December, MD   210 mg at 09/30/11 1524    SURGICAL HISTORY:  Past Surgical History  Procedure Date  . Tonsillectomy 1950  . Tubal ligation 1978  . Abdominal hysterectomy 1981  . Right ear surgery 1983    tumor removed (?)  . Right breast lumpectomy 1986  . Ethmoidectomy 1991  . Knee arthroscopy 1996, 2001    left  . Facial plastic surgery 1996  . Bunionectomy 1998    left foot  . Total knee arthroplasty 2006    left  . Nasal sinus surgery 2001  . Lap band surgery 2010  . Right mastectomy 06-26-11  . Port-a-cath placement 06/26/11    tip in lower SVC per chest x-ray read by Dr. Dwyane Dee     REVIEW OF SYSTEMS:  A comprehensive review of systems was negative.   PHYSICAL EXAMINATION: General appearance: alert, cooperative and no distress Neck: no adenopathy, no carotid bruit, no JVD, supple, symmetrical, trachea midline and thyroid not enlarged, symmetric, no tenderness/mass/nodules Lymph nodes: Cervical, supraclavicular, and axillary nodes normal. Resp: clear to auscultation bilaterally and normal percussion bilaterally Back: symmetric, no curvature. ROM normal. No CVA tenderness.  Cardio: regular rate and rhythm, S1, S2 normal, no murmur, click, rub or gallop GI: soft, non-tender; bowel sounds normal; no masses,  no organomegaly Extremities: extremities normal, atraumatic, no cyanosis or edema Neurologic: Grossly normal Left breast no masses nipple discharge no skin changes. Right mastectomy scar is healing well. There still is some scar tissue formation and some scabbing. But there is no signs of infection para  ECOG PERFORMANCE STATUS: 0 - Asymptomatic  Blood pressure 146/77, pulse 96, temperature 98.3 F (36.8 C), temperature source Oral, height 5' 3.5" (1.613 m), weight 221 lb 4.8 oz (100.381 kg).  LABORATORY DATA: Lab Results  Component Value Date   WBC 9.5 09/30/2011   HGB 12.1 09/30/2011   HCT 34.8 09/30/2011   MCV 88.1 09/30/2011   PLT 236 09/30/2011      Chemistry      Component Value Date/Time   NA 133* 09/30/2011 1006   K 3.6 09/30/2011 1006   CL 100 09/30/2011 1006   CO2 22 09/30/2011 1006   BUN 20 09/30/2011 1006   CREATININE 0.57 09/30/2011 1006      Component Value Date/Time   CALCIUM 9.7 09/30/2011 1006   ALKPHOS 64 09/30/2011 1006   AST 21 09/30/2011 1006   ALT 25 09/30/2011 1006   BILITOT 0.4 09/30/2011 1006       RADIOGRAPHIC STUDIES:  No results found.  ASSESSMENT: 66 year old female with  #1 stage I HER-2/neu positive breast cancer. Her final stage was 1.9 cm high-grade poorly differentiated invasive ductal carcinoma no LDI ER positive PR  positive HER-2/neu positive. She underwent a mastectomy. No sentinel nodes were involved with disease.  #2 patient has been receiving adjuvant chemotherapy consisting of weekly TCH combination. Unfortunately her tolerance of this is quite low. I have recommended beginning Taxotere carboplatinum every 3 weeks for a total of 4 cycles with weekly Herceptin until completion of the 4 cycles of TC once she completes the chemotherapy part of this regimen then I will switch her Herceptin to every 3 weeks. Patient and I discussed the risks and benefits of this approach. She does understand that this is a higher dose of Taxotere carboplatinum and that there may be times when she will require day 2 Neulasta. She understands the role of Neulasta.  PLAN:  #1 we will begin Taxotere carboplatinum every 3 weeks she will receive the first cycle today along with weekly Herceptin. Because her blood counts looked terrific with a white count of 9.5 I will hold the Neulasta this week. We will continue to monitor her CBC on a weekly basis. She will also continue to have C. Matte performed on a weekly basis as well.  #2  After patient completes her combination chemotherapy she certainly can resume having her reconstructive process by Dr. Shella Spearing  #3 patient will be seen back in one week's time for followup. All questions were answered today.  All questions were answered. The patient knows to call the clinic with any problems, questions or concerns. We can certainly see the patient much sooner if necessary.  I spent 20 minutes counseling the patient face to face. The total time spent in the appointment was 30 minutes.    Drue Second, MD Medical/Oncology [facility] [phone] (beeper) [phone] (Office)  09/30/2011, 5:41 PM

## 2011-09-30 NOTE — Patient Instructions (Signed)
Patient tolerated treatment well. Patient aware of next appointments. Patient states has nausea meds at home. Ambulatory to lobby at time of discharge.

## 2011-10-07 ENCOUNTER — Encounter: Payer: Self-pay | Admitting: *Deleted

## 2011-10-07 ENCOUNTER — Ambulatory Visit (HOSPITAL_BASED_OUTPATIENT_CLINIC_OR_DEPARTMENT_OTHER): Payer: BC Managed Care – PPO | Admitting: Family

## 2011-10-07 ENCOUNTER — Ambulatory Visit (HOSPITAL_BASED_OUTPATIENT_CLINIC_OR_DEPARTMENT_OTHER): Payer: BC Managed Care – PPO

## 2011-10-07 ENCOUNTER — Other Ambulatory Visit (HOSPITAL_BASED_OUTPATIENT_CLINIC_OR_DEPARTMENT_OTHER): Payer: BC Managed Care – PPO | Admitting: Lab

## 2011-10-07 ENCOUNTER — Telehealth: Payer: Self-pay | Admitting: *Deleted

## 2011-10-07 VITALS — BP 106/60 | Temp 97.7°F | Ht 63.5 in | Wt 212.8 lb

## 2011-10-07 DIAGNOSIS — C50919 Malignant neoplasm of unspecified site of unspecified female breast: Secondary | ICD-10-CM | POA: Diagnosis not present

## 2011-10-07 DIAGNOSIS — Z170 Estrogen receptor positive status [ER+]: Secondary | ICD-10-CM | POA: Diagnosis not present

## 2011-10-07 DIAGNOSIS — Z09 Encounter for follow-up examination after completed treatment for conditions other than malignant neoplasm: Secondary | ICD-10-CM

## 2011-10-07 DIAGNOSIS — C50119 Malignant neoplasm of central portion of unspecified female breast: Secondary | ICD-10-CM

## 2011-10-07 DIAGNOSIS — Z5112 Encounter for antineoplastic immunotherapy: Secondary | ICD-10-CM | POA: Diagnosis not present

## 2011-10-07 DIAGNOSIS — Z9010 Acquired absence of unspecified breast and nipple: Secondary | ICD-10-CM | POA: Diagnosis not present

## 2011-10-07 DIAGNOSIS — D702 Other drug-induced agranulocytosis: Secondary | ICD-10-CM

## 2011-10-07 LAB — CBC WITH DIFFERENTIAL/PLATELET
BASO%: 0.7 % (ref 0.0–2.0)
EOS%: 0 % (ref 0.0–7.0)
HCT: 37.3 % (ref 34.8–46.6)
LYMPH%: 85.8 % — ABNORMAL HIGH (ref 14.0–49.7)
MCH: 29.3 pg (ref 25.1–34.0)
MCHC: 35.4 g/dL (ref 31.5–36.0)
MONO%: 2 % (ref 0.0–14.0)
NEUT%: 11.5 % — ABNORMAL LOW (ref 38.4–76.8)
Platelets: 162 10*3/uL (ref 145–400)

## 2011-10-07 LAB — COMPREHENSIVE METABOLIC PANEL
AST: 29 U/L (ref 0–37)
Alkaline Phosphatase: 57 U/L (ref 39–117)
BUN: 24 mg/dL — ABNORMAL HIGH (ref 6–23)
Calcium: 9.6 mg/dL (ref 8.4–10.5)
Chloride: 94 mEq/L — ABNORMAL LOW (ref 96–112)
Creatinine, Ser: 0.78 mg/dL (ref 0.50–1.10)

## 2011-10-07 MED ORDER — SODIUM CHLORIDE 0.9 % IV SOLN: Freq: Once | INTRAVENOUS | Status: DC

## 2011-10-07 MED ORDER — SODIUM CHLORIDE 0.9 % IV SOLN
Freq: Once | INTRAVENOUS | Status: AC
  Administered 2011-10-07: 11:00:00 via INTRAVENOUS

## 2011-10-07 MED ORDER — CIPROFLOXACIN HCL 500 MG PO TABS: 500.0000 mg | ORAL_TABLET | Freq: Two times a day (BID) | ORAL | Status: AC

## 2011-10-07 MED ORDER — ACETAMINOPHEN 325 MG PO TABS
650.0000 mg | ORAL_TABLET | Freq: Once | ORAL | Status: AC
  Administered 2011-10-07: 650 mg via ORAL

## 2011-10-07 MED ORDER — TRASTUZUMAB CHEMO INJECTION 440 MG
2.0000 mg/kg | Freq: Once | INTRAVENOUS | Status: AC
  Administered 2011-10-07: 210 mg via INTRAVENOUS
  Filled 2011-10-07: qty 10

## 2011-10-07 MED ORDER — SODIUM CHLORIDE 0.9 % IJ SOLN
10.0000 mL | INTRAMUSCULAR | Status: DC | PRN
  Administered 2011-10-07: 10 mL
  Filled 2011-10-07: qty 10

## 2011-10-07 MED ORDER — DIPHENHYDRAMINE HCL 25 MG PO CAPS: 50.0000 mg | ORAL_CAPSULE | Freq: Once | ORAL | Status: DC

## 2011-10-07 MED ORDER — HEPARIN SOD (PORK) LOCK FLUSH 100 UNIT/ML IV SOLN
500.0000 [IU] | Freq: Once | INTRAVENOUS | Status: AC | PRN
  Administered 2011-10-07: 500 [IU]
  Filled 2011-10-07: qty 5

## 2011-10-07 NOTE — Telephone Encounter (Signed)
patient confirmed all injection appointments for 10-08-2011 10-09-2011 10-10-2011 10-12-2011

## 2011-10-07 NOTE — Progress Notes (Signed)
Walked over to chemo to deliver pt new schedule due to wrong dates for injections. Pt has already left building. Will mail pt new copy of schedule.

## 2011-10-08 ENCOUNTER — Ambulatory Visit (HOSPITAL_BASED_OUTPATIENT_CLINIC_OR_DEPARTMENT_OTHER): Payer: BC Managed Care – PPO

## 2011-10-08 ENCOUNTER — Encounter: Payer: Self-pay | Admitting: Family

## 2011-10-08 VITALS — BP 113/66 | HR 96 | Temp 97.4°F

## 2011-10-08 DIAGNOSIS — Z5189 Encounter for other specified aftercare: Secondary | ICD-10-CM | POA: Diagnosis not present

## 2011-10-08 DIAGNOSIS — C50919 Malignant neoplasm of unspecified site of unspecified female breast: Secondary | ICD-10-CM

## 2011-10-08 MED ORDER — FILGRASTIM 480 MCG/0.8ML IJ SOLN
480.0000 ug | Freq: Once | INTRAMUSCULAR | Status: AC
  Administered 2011-10-08: 480 ug via SUBCUTANEOUS
  Filled 2011-10-08: qty 0.8

## 2011-10-08 NOTE — Progress Notes (Signed)
[facility]  Name: [name]                  DATE: 10/08/2011 MRN: [id number]                      DOB: 1946/08/02  CC: [name], MD          REFERRING PHYSICIAN: [name], MD  DIAGNOSIS: Patient Active Problem List  Diagnoses Date Noted  . Breast cancer, stage 1 07/18/2011  Multicentric, ER/PR and HER-2 positive right breast carcinoma, status  post right simple mastectomy with axillary node dissection. Final  pathology revealed 1.9 cm, infiltrating ductal carcinoma with CISH ratio  of 9.3 and pathologic staging of T1c N0, Stage IA.     CURRENT THERAPY: Taxotere/Carbo every 3 weeks with Herceptin weekly.   INTERIM HISTORY: Received Taxol/carbo last week and has felt poorly since chemotherapy appear chief complaint is dizziness, syncopal episode on Saturday. Did not seek medical attention. States she is attempting to ensure adequate fluid intake. No nausea or vomiting. Some dyspnea with exertion. Appetite is fair, and bowel and bladder function are normal.  On last visit Dr. Welton Flakes changed her from weekly Taxotere/Carboplatinum to every 3 week dosing with Herceptin weekly. This was due to be significant problems and toxicities associated with weekly dosing. She did not receive Neulasta injection following chemotherapy.  PHYSICAL EXAM: BP 106/60  Temp(Src) 97.7 F (36.5 C) (Oral)  Ht 5' 3.5" (1.613 m)  Wt 212 lb 12.8 oz (96.525 kg)  BMI 37.10 kg/m2 General: Well developed, well nourished, in obvious distress, tearful and preferring to lie on the exam table. Alone at today's visit. EENT: No ocular or oral lesions. No stomatitis.  Respiratory: Lungs are clear to auscultation bilaterally with normal respiratory movement and no accessory muscle use. Cardiac: No murmur, rub or tachycardia. No upper or lower extremity edema.  GI: Abdomen is soft, no palpable hepatosplenomegaly. No fluid wave. No tenderness. Musculoskeletal: No kyphosis, no tenderness over  the spine, ribs or hips. Lymph: No cervical, infraclavicular, axillary or inguinal adenopathy. Neuro: No focal neurological deficits. Psych: Alert and oriented X 3, appropriate mood and affect.    SOCIAL HISTORY:   . 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 Smoking status: Former Games developer  . Alcohol Use: 0.6 oz/week    1 Glasses of wine per week     LABORATORY STUDIES:   Results for orders placed in visit on 10/07/11  CBC WITH DIFFERENTIAL      Component Value Range   WBC 1.5 (*) 3.9 - 10.3 (10e3/uL)   NEUT# 0.2 (*) 1.5 - 6.5 (10e3/uL)   HGB 13.2  11.6 - 15.9 (g/dL)   HCT 86.5  78.4 - 69.6 (%)   Platelets 162  145 - 400 (10e3/uL)   MCV 82.9  79.5 - 101.0 (fL)   MCH 29.3  25.1 - 34.0 (pg)   MCHC 35.4  31.5 - 36.0 (g/dL)   RBC 2.95  2.84 - 1.32 (10e6/uL)   RDW 13.9  11.2 - 14.5 (%)   lymph# 1.3  0.9 - 3.3 (10e3/uL)   MONO# 0.0 (*) 0.1 - 0.9 (10e3/uL)   Eosinophils Absolute 0.0  0.0 - 0.5 (10e3/uL)   Basophils Absolute 0.0  0.0 - 0.1 (10e3/uL)   NEUT% 11.5 (*) 38.4 - 76.8 (%)   LYMPH% 85.8 (*) 14.0 - 49.7 (%)   MONO%  2.0  0.0 - 14.0 (%)   EOS% 0.0  0.0 - 7.0 (%)   BASO% 0.7  0.0 - 2.0 (%)   nRBC 0  0 - 0 (%)  COMPREHENSIVE METABOLIC PANEL      Component Value Range   Sodium 130 (*) 135 - 145 (mEq/L)   Potassium 3.4 (*) 3.5 - 5.3 (mEq/L)   Chloride 94 (*) 96 - 112 (mEq/L)   CO2 24  19 - 32 (mEq/L)   Glucose, Bld 127 (*) 70 - 99 (mg/dL)   BUN 24 (*) 6 - 23 (mg/dL)   Creatinine, Ser 6.21  0.50 - 1.10 (mg/dL)   Total Bilirubin 1.0  0.3 - 1.2 (mg/dL)   Alkaline Phosphatase 57  39 - 117 (U/L)   AST 29  0 - 37 (U/L)   ALT 28  0 - 35 (U/L)   Total Protein 5.9 (*) 6.0 - 8.3 (g/dL)   Albumin 3.8  3.5 - 5.2 (g/dL)   Calcium 9.6  8.4 - 30.8 (mg/dL)    IMPRESSION:  66 year old white female with: 1. Stage I right breast cancer, ER/PR positive, HER-2/neu positive, on treatment with poor tolerance.  Dizziness and syncope after last treatment.  2. Delayed healing, right mastectomy incision. Followed by plastic surgery. 3. Neutropenia. 4. Postural hypotension.   PLAN:   1. Treatment today, Herceptin alone. Will add additional fluids to today's treatment.  2. Neupogen injection daily beginning today for 4 treatments. Repeat CBC Monday, Jan. 28.  3. Return to clinic to see Dr. Welton Flakes 1/30 prior to next treatment.   4. Prescription for Cipro 500 mg twice daily for 10 days for prophylaxis.

## 2011-10-09 ENCOUNTER — Ambulatory Visit (HOSPITAL_BASED_OUTPATIENT_CLINIC_OR_DEPARTMENT_OTHER): Payer: BC Managed Care – PPO

## 2011-10-09 VITALS — BP 114/74 | HR 95 | Temp 98.6°F

## 2011-10-09 DIAGNOSIS — Z5189 Encounter for other specified aftercare: Secondary | ICD-10-CM | POA: Diagnosis not present

## 2011-10-09 DIAGNOSIS — D709 Neutropenia, unspecified: Secondary | ICD-10-CM

## 2011-10-09 DIAGNOSIS — C50919 Malignant neoplasm of unspecified site of unspecified female breast: Secondary | ICD-10-CM

## 2011-10-09 MED ORDER — FILGRASTIM 480 MCG/0.8ML IJ SOLN
480.0000 ug | Freq: Once | INTRAMUSCULAR | Status: AC
  Administered 2011-10-09: 480 ug via SUBCUTANEOUS
  Filled 2011-10-09: qty 0.8

## 2011-10-10 ENCOUNTER — Ambulatory Visit: Payer: BC Managed Care – PPO

## 2011-10-12 ENCOUNTER — Ambulatory Visit: Payer: BC Managed Care – PPO

## 2011-10-12 ENCOUNTER — Ambulatory Visit (HOSPITAL_BASED_OUTPATIENT_CLINIC_OR_DEPARTMENT_OTHER): Payer: BC Managed Care – PPO

## 2011-10-12 ENCOUNTER — Encounter: Payer: Self-pay | Admitting: *Deleted

## 2011-10-12 VITALS — BP 119/77 | HR 98 | Temp 99.3°F

## 2011-10-12 DIAGNOSIS — C50919 Malignant neoplasm of unspecified site of unspecified female breast: Secondary | ICD-10-CM | POA: Diagnosis not present

## 2011-10-12 LAB — COMPREHENSIVE METABOLIC PANEL
ALT: 25 U/L (ref 0–35)
CO2: 28 mEq/L (ref 19–32)
Calcium: 8.8 mg/dL (ref 8.4–10.5)
Chloride: 101 mEq/L (ref 96–112)
Glucose, Bld: 123 mg/dL — ABNORMAL HIGH (ref 70–99)
Sodium: 138 mEq/L (ref 135–145)
Total Bilirubin: 0.4 mg/dL (ref 0.3–1.2)
Total Protein: 5.5 g/dL — ABNORMAL LOW (ref 6.0–8.3)

## 2011-10-12 LAB — CBC WITH DIFFERENTIAL/PLATELET
BASO%: 0.3 % (ref 0.0–2.0)
Eosinophils Absolute: 0 10*3/uL (ref 0.0–0.5)
HCT: 32.7 % — ABNORMAL LOW (ref 34.8–46.6)
LYMPH%: 36.4 % (ref 14.0–49.7)
MONO#: 0.4 10*3/uL (ref 0.1–0.9)
NEUT#: 4.6 10*3/uL (ref 1.5–6.5)
NEUT%: 57.9 % (ref 38.4–76.8)
Platelets: 121 10*3/uL — ABNORMAL LOW (ref 145–400)
RBC: 3.72 10*6/uL (ref 3.70–5.45)
WBC: 8 10*3/uL (ref 3.9–10.3)
lymph#: 2.9 10*3/uL (ref 0.9–3.3)

## 2011-10-12 MED ORDER — FILGRASTIM 480 MCG/0.8ML IJ SOLN
480.0000 ug | Freq: Once | INTRAMUSCULAR | Status: AC
  Administered 2011-10-12: 480 ug via SUBCUTANEOUS
  Filled 2011-10-12: qty 0.8

## 2011-10-14 ENCOUNTER — Other Ambulatory Visit: Payer: Self-pay | Admitting: *Deleted

## 2011-10-14 ENCOUNTER — Ambulatory Visit: Payer: BC Managed Care – PPO | Admitting: Oncology

## 2011-10-14 ENCOUNTER — Ambulatory Visit (HOSPITAL_BASED_OUTPATIENT_CLINIC_OR_DEPARTMENT_OTHER): Payer: BC Managed Care – PPO

## 2011-10-14 ENCOUNTER — Other Ambulatory Visit: Payer: BC Managed Care – PPO

## 2011-10-14 ENCOUNTER — Telehealth: Payer: Self-pay | Admitting: Oncology

## 2011-10-14 VITALS — BP 122/81 | HR 91 | Temp 98.3°F | Ht 63.5 in | Wt 219.9 lb

## 2011-10-14 DIAGNOSIS — C50919 Malignant neoplasm of unspecified site of unspecified female breast: Secondary | ICD-10-CM

## 2011-10-14 DIAGNOSIS — Z5112 Encounter for antineoplastic immunotherapy: Secondary | ICD-10-CM | POA: Diagnosis not present

## 2011-10-14 LAB — COMPREHENSIVE METABOLIC PANEL
ALT: 28 U/L (ref 0–35)
AST: 27 U/L (ref 0–37)
Albumin: 2.9 g/dL — ABNORMAL LOW (ref 3.5–5.2)
Alkaline Phosphatase: 91 U/L (ref 39–117)
Glucose, Bld: 93 mg/dL (ref 70–99)
Potassium: 3.5 mEq/L (ref 3.5–5.3)
Sodium: 138 mEq/L (ref 135–145)
Total Bilirubin: 0.2 mg/dL — ABNORMAL LOW (ref 0.3–1.2)
Total Protein: 5.9 g/dL — ABNORMAL LOW (ref 6.0–8.3)

## 2011-10-14 LAB — CBC WITH DIFFERENTIAL/PLATELET
BASO%: 0.3 % (ref 0.0–2.0)
EOS%: 0 % (ref 0.0–7.0)
Eosinophils Absolute: 0 10*3/uL (ref 0.0–0.5)
LYMPH%: 32.5 % (ref 14.0–49.7)
MCH: 28.9 pg (ref 25.1–34.0)
MCHC: 33.3 g/dL (ref 31.5–36.0)
MCV: 86.8 fL (ref 79.5–101.0)
MONO%: 10.8 % (ref 0.0–14.0)
Platelets: 85 10*3/uL — ABNORMAL LOW (ref 145–400)
RBC: 3.7 10*6/uL (ref 3.70–5.45)
RDW: 15.3 % — ABNORMAL HIGH (ref 11.2–14.5)
nRBC: 0 % (ref 0–0)

## 2011-10-14 MED ORDER — ACETAMINOPHEN 325 MG PO TABS
650.0000 mg | ORAL_TABLET | Freq: Once | ORAL | Status: AC
  Administered 2011-10-14: 650 mg via ORAL

## 2011-10-14 MED ORDER — TRASTUZUMAB CHEMO INJECTION 440 MG
2.0000 mg/kg | Freq: Once | INTRAVENOUS | Status: AC
  Administered 2011-10-14: 210 mg via INTRAVENOUS
  Filled 2011-10-14: qty 10

## 2011-10-14 MED ORDER — SODIUM CHLORIDE 0.9 % IV SOLN
Freq: Once | INTRAVENOUS | Status: AC
  Administered 2011-10-14: 17:00:00 via INTRAVENOUS

## 2011-10-14 MED ORDER — DIPHENHYDRAMINE HCL 25 MG PO CAPS
50.0000 mg | ORAL_CAPSULE | Freq: Once | ORAL | Status: AC
  Administered 2011-10-14: 50 mg via ORAL

## 2011-10-14 NOTE — Telephone Encounter (Signed)
gve the pt her revised feb 2013 appt calendar in the tx room

## 2011-10-16 NOTE — Progress Notes (Signed)
OFFICE PROGRESS NOTE  CC [name] M.D. [name] M.D.  [facility], MD, MD [address] [place] [address]  DIAGNOSIS: 66 year old female with multicentric right breast cancer that was ER positive PR positive HER-2/neu positive clinical stage I.  PRIOR THERAPY:  #1 status post right simple mastectomy with right axillary sentinel node biopsy and left subclavian power port insertion on 06/26/2011.  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 Sentinel node biopsy done at the time of her mastectomy was negative for metastatic disease. Pathologic stage TI C. N0 M0, stage I breast cancer.  #3 patient has been receiving weekly Taxotere carboplatinum and Herceptin beginning November 2012. However she has had considerable side effects requiring discontinuation/holding of the chemotherapy.  CURRENT THERAPY:she will begin Taxotere carboplatinum given every 21 days with weekly Herceptin starting 09/30/2011   INTERVAL HISTORY: [name] 66 y.o. female followup visit today for her scheduled chemotherapy. Today she was scheduled to get her weekly Taxotere carboplatinum Herceptin combination with 3 weeks on with [facility] followed by 1 week of Herceptin only. However after reviewing her side effects she has had significant problems with the weekly combination of TCH. Upon reviewing her chart we've had significant delays in being able to get the treatment into her. I do think that this combination especially the weekly regimen is significantly toxic to her. The patient and I have discussed this at great length today. She is also quite concerned about continuing this combination and this dosing and schedule. Today she does feel well last week she received Herceptin only. She tolerates Herceptin very well. She denies any fevers chills night sweats headaches shortness of breath chest pains  palpitations she has no myalgias or arthralgias currently. Although she does suffer chronic weight from myalgias and arthralgias. She is denying any peripheral paresthesias today. Remainder of the 10 point review of systems is negative. MEDICAL HISTORY: Past Medical History  Diagnosis Date  . Allergy   . Asthma   . Hypertension   . Hyperlipidemia   . Depression   . Fibromyalgia   . Arthritis     feet, knee and base of thumbs  . Osteopenia   . Carpal tunnel syndrome     right  . Breast cancer, stage 1 07/18/2011    ALLERGIES:   has no known allergies.  MEDICATIONS:  Current Outpatient Prescriptions  Medication Sig Dispense Refill  . atorvastatin (LIPITOR) 20 MG tablet Take 20 mg by mouth daily.        . calcium gluconate 500 MG tablet Take 500 mg by mouth daily.        . cholecalciferol (VITAMIN D) 1000 UNITS tablet Take 2,000 Units by mouth daily.        . ciprofloxacin (CIPRO) 500 MG tablet Take 1 tablet (500 mg total) by mouth 2 (two) times daily.  20 tablet  1  . diphenhydrAMINE (SOMINEX) 25 MG tablet Take 25 mg by mouth at bedtime as needed.        . DULoxetine (CYMBALTA) 60 MG capsule Take 120 mg by mouth daily.       Marland Kitchen gabapentin (NEURONTIN) 600 MG tablet Take 600 mg by mouth daily.       . hydrochlorothiazide (HYDRODIURIL) 25 MG tablet Take 25 mg by mouth daily.      Boris Lown Oil 300 MG CAPS Take 1 capsule by mouth daily.        Marland Kitchen  lidocaine-prilocaine (EMLA) cream Apply 1 application topically as needed.        Marland Kitchen lisinopril (PRINIVIL,ZESTRIL) 10 MG tablet Take 10 mg by mouth daily.      Marland Kitchen loratadine (CLARITIN) 10 MG tablet Take 10 mg by mouth daily.        . magnesium 30 MG tablet Take 500 mg by mouth daily.       . Multiple Vitamin (MULTIVITAMIN) capsule Take 1 capsule by mouth daily.        . ondansetron (ZOFRAN) 8 MG tablet Take 8 mg by mouth as directed.        Marland Kitchen Prochlorperazine Maleate (COMPAZINE PO) Take 10 mg by mouth.        . traMADol (ULTRAM) 50 MG tablet Take  50 mg by mouth as needed.          SURGICAL HISTORY:  Past Surgical History  Procedure Date  . Tonsillectomy 1950  . Tubal ligation 1978  . Abdominal hysterectomy 1981  . Right ear surgery 1983    tumor removed (?)  . Right breast lumpectomy 1986  . Ethmoidectomy 1991  . Knee arthroscopy 1996, 2001    left  . Facial plastic surgery 1996  . Bunionectomy 1998    left foot  . Total knee arthroplasty 2006    left  . Nasal sinus surgery 2001  . Lap band surgery 2010  . Right mastectomy 06-26-11  . Port-a-cath placement 06/26/11    tip in lower SVC per chest x-ray read by Dr. Dwyane Dee    REVIEW OF SYSTEMS:  A comprehensive review of systems was negative.   PHYSICAL EXAMINATION: General appearance: alert, cooperative and no distress Neck: no adenopathy, no carotid bruit, no JVD, supple, symmetrical, trachea midline and thyroid not enlarged, symmetric, no tenderness/mass/nodules Lymph nodes: Cervical, supraclavicular, and axillary nodes normal. Resp: clear to auscultation bilaterally and normal percussion bilaterally Back: symmetric, no curvature. ROM normal. No CVA tenderness. Cardio: regular rate and rhythm, S1, S2 normal, no murmur, click, rub or gallop GI: soft, non-tender; bowel sounds normal; no masses,  no organomegaly Extremities: extremities normal, atraumatic, no cyanosis or edema Neurologic: Grossly normal Left breast no masses nipple discharge no skin changes. Right mastectomy scar is healing well. There still is some scar tissue formation and some scabbing. But there is no signs of infection para  ECOG PERFORMANCE STATUS: 0 - Asymptomatic  Blood pressure 122/81, pulse 91, temperature 98.3 F (36.8 C), temperature source Oral, height 5' 3.5" (1.613 m), weight 219 lb 14.4 oz (99.746 kg).  LABORATORY DATA: Lab Results  Component Value Date   WBC 10.7* 10/14/2011   HGB 10.7* 10/14/2011   HCT 32.1* 10/14/2011   MCV 86.8 10/14/2011   PLT 85* 10/14/2011       Chemistry      Component Value Date/Time   NA 138 10/14/2011 1452   K 3.5 10/14/2011 1452   CL 101 10/14/2011 1452   CO2 26 10/14/2011 1452   BUN 19 10/14/2011 1452   CREATININE 0.62 10/14/2011 1452      Component Value Date/Time   CALCIUM 9.3 10/14/2011 1452   ALKPHOS 91 10/14/2011 1452   AST 27 10/14/2011 1452   ALT 28 10/14/2011 1452   BILITOT 0.2* 10/14/2011 1452       RADIOGRAPHIC STUDIES:  No results found.  ASSESSMENT: 66 year old female with   #1 stage I HER-2/neu positive breast cancer. Her final stage was 1.9 cm high-grade poorly differentiated invasive ductal carcinoma no LDI ER  positive PR positive HER-2/neu positive. She underwent a mastectomy. No sentinel nodes were involved with disease.  #2 patient is receiving Taxotere carboplatinum every 3 weeks with weekly Herceptin. She does have pretty good tolerance of this. But she does require IV fluids as supportive care  #3 thrombocytopenia likely secondary to her systemic treatment  PLAN:  #1 patient will proceed with her scheduled weekly Herceptin today. She overall today is doing well her next dose of carboplatinum and Taxotere will be next week and I will plan on seeing her back  #2 patient does have area of bilateral erythema. She will go ahead and apply some hydrocortisone on this and I will followup next week.  #3 Thrombocytopenia we will continue to follow. She has no evidence of bleeding  All questions were answered. The patient knows to call the clinic with any problems, questions or concerns. We can certainly see the patient much sooner if necessary.  I spent 20 minutes counseling the patient face to face. The total time spent in the appointment was 30 minutes.    Drue Second, MD Medical/Oncology [facility] [phone] (beeper) [phone] (Office)  10/16/2011, 12:39 AM

## 2011-10-21 ENCOUNTER — Ambulatory Visit: Payer: BC Managed Care – PPO | Admitting: Oncology

## 2011-10-21 ENCOUNTER — Telehealth: Payer: Self-pay | Admitting: Oncology

## 2011-10-21 ENCOUNTER — Other Ambulatory Visit: Payer: BC Managed Care – PPO | Admitting: Lab

## 2011-10-21 ENCOUNTER — Ambulatory Visit (HOSPITAL_BASED_OUTPATIENT_CLINIC_OR_DEPARTMENT_OTHER): Payer: BC Managed Care – PPO

## 2011-10-21 ENCOUNTER — Encounter: Payer: Self-pay | Admitting: Oncology

## 2011-10-21 VITALS — BP 138/78 | HR 106 | Temp 98.4°F | Ht 63.5 in | Wt 219.6 lb

## 2011-10-21 DIAGNOSIS — Z09 Encounter for follow-up examination after completed treatment for conditions other than malignant neoplasm: Secondary | ICD-10-CM

## 2011-10-21 DIAGNOSIS — Z5111 Encounter for antineoplastic chemotherapy: Secondary | ICD-10-CM | POA: Diagnosis not present

## 2011-10-21 DIAGNOSIS — E860 Dehydration: Secondary | ICD-10-CM | POA: Diagnosis not present

## 2011-10-21 DIAGNOSIS — C50919 Malignant neoplasm of unspecified site of unspecified female breast: Secondary | ICD-10-CM

## 2011-10-21 HISTORY — DX: Dehydration: E86.0

## 2011-10-21 LAB — CBC WITH DIFFERENTIAL/PLATELET
BASO%: 0.1 % (ref 0.0–2.0)
LYMPH%: 13.9 % — ABNORMAL LOW (ref 14.0–49.7)
MCHC: 33.7 g/dL (ref 31.5–36.0)
MONO#: 0.6 10*3/uL (ref 0.1–0.9)
Platelets: 304 10*3/uL (ref 145–400)
RBC: 3.92 10*6/uL (ref 3.70–5.45)
WBC: 11.9 10*3/uL — ABNORMAL HIGH (ref 3.9–10.3)
lymph#: 1.7 10*3/uL (ref 0.9–3.3)
nRBC: 0 % (ref 0–0)

## 2011-10-21 LAB — COMPREHENSIVE METABOLIC PANEL
ALT: 32 U/L (ref 0–35)
AST: 25 U/L (ref 0–37)
Alkaline Phosphatase: 65 U/L (ref 39–117)
Calcium: 9.9 mg/dL (ref 8.4–10.5)
Chloride: 101 mEq/L (ref 96–112)
Creatinine, Ser: 0.93 mg/dL (ref 0.50–1.10)

## 2011-10-21 MED ORDER — DEXAMETHASONE SODIUM PHOSPHATE 4 MG/ML IJ SOLN
20.0000 mg | Freq: Once | INTRAMUSCULAR | Status: AC
  Administered 2011-10-21: 20 mg via INTRAVENOUS

## 2011-10-21 MED ORDER — DIPHENHYDRAMINE HCL 25 MG PO CAPS
50.0000 mg | ORAL_CAPSULE | Freq: Once | ORAL | Status: AC
  Administered 2011-10-21: 50 mg via ORAL

## 2011-10-21 MED ORDER — ONDANSETRON 16 MG/50ML IVPB (CHCC)
16.0000 mg | Freq: Once | INTRAVENOUS | Status: AC
  Administered 2011-10-21: 16 mg via INTRAVENOUS

## 2011-10-21 MED ORDER — TRASTUZUMAB CHEMO INJECTION 440 MG
2.0000 mg/kg | Freq: Once | INTRAVENOUS | Status: AC
  Administered 2011-10-21: 210 mg via INTRAVENOUS
  Filled 2011-10-21: qty 10

## 2011-10-21 MED ORDER — SODIUM CHLORIDE 0.9 % IV SOLN
825.0000 mg | Freq: Once | INTRAVENOUS | Status: AC
  Administered 2011-10-21: 830 mg via INTRAVENOUS
  Filled 2011-10-21: qty 83

## 2011-10-21 MED ORDER — ACETAMINOPHEN 325 MG PO TABS
650.0000 mg | ORAL_TABLET | Freq: Once | ORAL | Status: AC
  Administered 2011-10-21: 650 mg via ORAL

## 2011-10-21 MED ORDER — SODIUM CHLORIDE 0.9 % IV SOLN: Freq: Once | INTRAVENOUS | Status: DC

## 2011-10-21 MED ORDER — DOCETAXEL CHEMO INJECTION 160 MG/16ML
75.0000 mg/m2 | Freq: Once | INTRAVENOUS | Status: AC
  Administered 2011-10-21: 160 mg via INTRAVENOUS
  Filled 2011-10-21: qty 16

## 2011-10-21 MED ORDER — HEPARIN SOD (PORK) LOCK FLUSH 100 UNIT/ML IV SOLN
500.0000 [IU] | Freq: Once | INTRAVENOUS | Status: AC | PRN
  Administered 2011-10-21: 500 [IU]
  Filled 2011-10-21: qty 5

## 2011-10-21 MED ORDER — SODIUM CHLORIDE 0.9 % IJ SOLN
10.0000 mL | INTRAMUSCULAR | Status: DC | PRN
  Administered 2011-10-21: 10 mL
  Filled 2011-10-21: qty 10

## 2011-10-21 NOTE — Patient Instructions (Signed)
Discharged ambulatory without issues.  Tolerated treatment well today.  Is aware of all upcoming appointments.

## 2011-10-21 NOTE — Telephone Encounter (Signed)
gve the pt her revised feb-may 2013 appt calendar

## 2011-10-21 NOTE — Progress Notes (Signed)
OFFICE PROGRESS NOTE  CC [name] M.D. [name] M.D.  [facility], MD, MD [address] [place] [address]  DIAGNOSIS: 66 year old female with multicentric right breast cancer that was ER positive PR positive HER-2/neu positive clinical stage I.  PRIOR THERAPY:  #1 status post right simple mastectomy with right axillary sentinel node biopsy and left subclavian power port insertion on 06/26/2011.  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 Sentinel node biopsy done at the time of her mastectomy was negative for metastatic disease. Pathologic stage TI C. N0 M0, stage I breast cancer.  #3 patient has been receiving weekly Taxotere carboplatinum and Herceptin beginning November 2012. However she has had considerable side effects requiring discontinuation/holding of the chemotherapy.  CURRENT THERAPY:she will begin Taxotere carboplatinum given every 21 days with weekly Herceptin starting 09/30/2011   INTERVAL HISTORY: [name] 66 y.o. female followup visit today for her scheduled chemotherapy. She today is scheduled to get Taxotere carboplatinum and Herceptin. We also will be giving her IV fluids on day 3 of chemotherapy. This will fall on 10/23/2011. Clinically she seems to be doing much much better. She overall feels well. She is denying any nausea vomiting fevers chills night sweats headaches. She still is weak and tired but states that it is significantly better. She is complaining of some pain in her big toes bilaterally but there is no erythema or redness or skin desquamation or changes in her nails. She has no myalgias or arthralgias. She denies any easy bruising or bleeding. Remainder of the 10 point review of systems is negative.  MEDICAL HISTORY: Past Medical History  Diagnosis Date  . Allergy   . Asthma   . Hypertension   . Hyperlipidemia   .  Depression   . Fibromyalgia   . Arthritis     feet, knee and base of thumbs  . Osteopenia   . Carpal tunnel syndrome     right  . Breast cancer, stage 1 07/18/2011  . Dehydration 10/21/2011    ALLERGIES:   has no known allergies.  MEDICATIONS:  Current Outpatient Prescriptions  Medication Sig Dispense Refill  . alendronate (FOSAMAX) 70 MG tablet Take 70 mg by mouth every 7 (seven) days. Take with a full glass of water on an empty stomach.      Marland Kitchen atorvastatin (LIPITOR) 20 MG tablet Take 20 mg by mouth daily.        . calcium gluconate 500 MG tablet Take 500 mg by mouth daily.        . cholecalciferol (VITAMIN D) 1000 UNITS tablet Take 2,000 Units by mouth daily.        . diphenhydrAMINE (SOMINEX) 25 MG tablet Take 25 mg by mouth at bedtime as needed.        . DULoxetine (CYMBALTA) 60 MG capsule Take 120 mg by mouth daily.       Marland Kitchen gabapentin (NEURONTIN) 600 MG tablet Take 600 mg by mouth daily.       . hydrochlorothiazide (HYDRODIURIL) 25 MG tablet Take 25 mg by mouth daily.      Boris Lown Oil 300 MG CAPS Take 1 capsule by mouth daily.        Marland Kitchen lidocaine-prilocaine (EMLA) cream Apply 1 application topically as needed.        Marland Kitchen lisinopril (PRINIVIL,ZESTRIL) 10 MG tablet Take 10 mg by mouth daily.      Marland Kitchen  loratadine (CLARITIN) 10 MG tablet Take 10 mg by mouth daily.        . magnesium 30 MG tablet Take 500 mg by mouth daily.       . Multiple Vitamin (MULTIVITAMIN) capsule Take 1 capsule by mouth daily.        . ondansetron (ZOFRAN) 8 MG tablet Take 8 mg by mouth as directed.        Marland Kitchen Prochlorperazine Maleate (COMPAZINE PO) Take 10 mg by mouth.        . traMADol (ULTRAM) 50 MG tablet Take 50 mg by mouth as needed.         No current facility-administered medications for this visit.   Facility-Administered Medications Ordered in Other Visits  Medication Dose Route Frequency Provider Last Rate Last Dose  . 0.9 %  sodium chloride infusion   Intravenous Once Victorino December, MD      . 0.9 %   sodium chloride infusion   Intravenous Once Victorino December, MD      . acetaminophen (TYLENOL) tablet 650 mg  650 mg Oral Once Victorino December, MD   650 mg at 10/21/11 1125  . CARBOplatin (PARAPLATIN) 830 mg in sodium chloride 0.9 % 500 mL chemo infusion  830 mg Intravenous Once Victorino December, MD   830 mg at 10/21/11 1309  . dexamethasone (DECADRON) injection 20 mg  20 mg Intravenous Once Victorino December, MD   20 mg at 10/21/11 1124  . diphenhydrAMINE (BENADRYL) capsule 50 mg  50 mg Oral Once Victorino December, MD   50 mg at 10/21/11 1125  . DOCEtaxel (TAXOTERE) 160 mg in dextrose 5 % 250 mL chemo infusion  75 mg/m2 (Treatment Plan Actual) Intravenous Once Victorino December, MD   160 mg at 10/21/11 1155  . heparin lock flush 100 unit/mL  500 Units Intracatheter Once PRN Victorino December, MD   500 Units at 10/21/11 1520  . ondansetron (ZOFRAN) IVPB 16 mg  16 mg Intravenous Once Victorino December, MD   16 mg at 10/21/11 1126  . sodium chloride 0.9 % injection 10 mL  10 mL Intracatheter PRN Victorino December, MD   10 mL at 10/21/11 1520  . trastuzumab (HERCEPTIN) 210 mg in sodium chloride 0.9 % 250 mL chemo infusion  2 mg/kg (Treatment Plan Actual) Intravenous Once Victorino December, MD   210 mg at 10/21/11 1440    SURGICAL HISTORY:  Past Surgical History  Procedure Date  . Tonsillectomy 1950  . Tubal ligation 1978  . Abdominal hysterectomy 1981  . Right ear surgery 1983    tumor removed (?)  . Right breast lumpectomy 1986  . Ethmoidectomy 1991  . Knee arthroscopy 1996, 2001    left  . Facial plastic surgery 1996  . Bunionectomy 1998    left foot  . Total knee arthroplasty 2006    left  . Nasal sinus surgery 2001  . Lap band surgery 2010  . Right mastectomy 06-26-11  . Port-a-cath placement 06/26/11    tip in lower SVC per chest x-ray read by Dr. Dwyane Dee    REVIEW OF SYSTEMS:  A comprehensive review of systems was negative.   PHYSICAL EXAMINATION: General appearance: alert, cooperative  and no distress Neck: no adenopathy, no carotid bruit, no JVD, supple, symmetrical, trachea midline and thyroid not enlarged, symmetric, no tenderness/mass/nodules Lymph nodes: Cervical, supraclavicular, and axillary nodes normal. Resp: clear to auscultation bilaterally and normal percussion bilaterally Back: symmetric,  no curvature. ROM normal. No CVA tenderness. Cardio: regular rate and rhythm, S1, S2 normal, no murmur, click, rub or gallop GI: soft, non-tender; bowel sounds normal; no masses,  no organomegaly Extremities: extremities normal, atraumatic, no cyanosis or edema Neurologic: Grossly normal Left breast no masses nipple discharge no skin changes. Right mastectomy scar is healing well. There still is some scar tissue formation and some scabbing. But there is no signs of infection para  ECOG PERFORMANCE STATUS: 0 - Asymptomatic  Blood pressure 138/78, pulse 106, temperature 98.4 F (36.9 C), temperature source Oral, height 5' 3.5" (1.613 m), weight 219 lb 9.6 oz (99.61 kg).  LABORATORY DATA: Lab Results  Component Value Date   WBC 11.9* 10/21/2011   HGB 11.6 10/21/2011   HCT 34.4* 10/21/2011   MCV 87.8 10/21/2011   PLT 304 10/21/2011      Chemistry      Component Value Date/Time   NA 136 10/21/2011 0946   K 3.8 10/21/2011 0946   CL 101 10/21/2011 0946   CO2 21 10/21/2011 0946   BUN 17 10/21/2011 0946   CREATININE 0.93 10/21/2011 0946      Component Value Date/Time   CALCIUM 9.9 10/21/2011 0946   ALKPHOS 65 10/21/2011 0946   AST 25 10/21/2011 0946   ALT 32 10/21/2011 0946   BILITOT 0.4 10/21/2011 0946       RADIOGRAPHIC STUDIES:  No results found.  ASSESSMENT: 66 year old female with   #1 stage I HER-2/neu positive breast cancer. Her final stage was 1.9 cm high-grade poorly differentiated invasive ductal carcinoma no LDI ER positive PR positive HER-2/neu positive. She underwent a mastectomy. No sentinel nodes were involved with disease.  #2 patient is receiving Taxotere carboplatinum  every 3 weeks with weekly Herceptin. She does have pretty good tolerance of this. But she does require IV fluids as supportive care  #3 thrombocytopenia likely secondary to her systemic treatment This is some improved today with good recovery of her counts.  #4 we will proceed with her scheduled chemotherapy today.  PLAN:  #1 patient will proceed with her scheduled weekly Herceptin today. She overall today is doing well her next dose of carboplatinum and Taxotere will be next week and I will plan on seeing her back  #2 patient does have area of bilateral erythema. She will go ahead and apply some hydrocortisone on this and I will followup next week.  #3 Bilateral great toe pain examination is unrevealing we will continue to monitor this.  All questions were answered. The patient knows to call the clinic with any problems, questions or concerns. We can certainly see the patient much sooner if necessary.  I spent 30 minutes counseling the patient face to face. The total time spent in the appointment was 30 minutes.    Drue Second, MD Medical/Oncology [facility] [phone] (beeper) [phone] (Office)  10/21/2011, 6:37 PM

## 2011-10-22 ENCOUNTER — Ambulatory Visit (HOSPITAL_BASED_OUTPATIENT_CLINIC_OR_DEPARTMENT_OTHER): Payer: BC Managed Care – PPO

## 2011-10-22 VITALS — BP 122/75 | HR 79 | Temp 97.4°F

## 2011-10-22 DIAGNOSIS — C50919 Malignant neoplasm of unspecified site of unspecified female breast: Secondary | ICD-10-CM

## 2011-10-22 DIAGNOSIS — Z5189 Encounter for other specified aftercare: Secondary | ICD-10-CM | POA: Diagnosis not present

## 2011-10-22 MED ORDER — FILGRASTIM 480 MCG/0.8ML IJ SOLN
480.0000 ug | Freq: Once | INTRAMUSCULAR | Status: DC
  Filled 2011-10-22: qty 0.8

## 2011-10-22 MED ORDER — PEGFILGRASTIM INJECTION 6 MG/0.6ML
6.0000 mg | Freq: Once | SUBCUTANEOUS | Status: AC
  Administered 2011-10-22: 6 mg via SUBCUTANEOUS
  Filled 2011-10-22: qty 0.6

## 2011-10-23 ENCOUNTER — Ambulatory Visit (HOSPITAL_BASED_OUTPATIENT_CLINIC_OR_DEPARTMENT_OTHER): Payer: BC Managed Care – PPO

## 2011-10-23 DIAGNOSIS — E860 Dehydration: Secondary | ICD-10-CM

## 2011-10-23 DIAGNOSIS — C50919 Malignant neoplasm of unspecified site of unspecified female breast: Secondary | ICD-10-CM | POA: Diagnosis not present

## 2011-10-23 DIAGNOSIS — Z5189 Encounter for other specified aftercare: Secondary | ICD-10-CM

## 2011-10-23 MED ORDER — SODIUM CHLORIDE 0.9 % IV SOLN
INTRAVENOUS | Status: DC
  Administered 2011-10-23: 13:00:00 via INTRAVENOUS

## 2011-10-27 ENCOUNTER — Ambulatory Visit (HOSPITAL_COMMUNITY): Payer: BC Managed Care – PPO | Attending: Internal Medicine

## 2011-10-28 ENCOUNTER — Other Ambulatory Visit: Payer: BC Managed Care – PPO | Admitting: Lab

## 2011-10-28 ENCOUNTER — Telehealth: Payer: Self-pay | Admitting: *Deleted

## 2011-10-28 ENCOUNTER — Ambulatory Visit (HOSPITAL_BASED_OUTPATIENT_CLINIC_OR_DEPARTMENT_OTHER): Payer: BC Managed Care – PPO | Admitting: Family

## 2011-10-28 ENCOUNTER — Encounter: Payer: Self-pay | Admitting: Family

## 2011-10-28 ENCOUNTER — Other Ambulatory Visit: Payer: No Typology Code available for payment source | Admitting: Lab

## 2011-10-28 ENCOUNTER — Ambulatory Visit (HOSPITAL_BASED_OUTPATIENT_CLINIC_OR_DEPARTMENT_OTHER): Payer: BC Managed Care – PPO

## 2011-10-28 ENCOUNTER — Ambulatory Visit: Payer: No Typology Code available for payment source | Admitting: Oncology

## 2011-10-28 DIAGNOSIS — C50919 Malignant neoplasm of unspecified site of unspecified female breast: Secondary | ICD-10-CM

## 2011-10-28 DIAGNOSIS — Z5112 Encounter for antineoplastic immunotherapy: Secondary | ICD-10-CM

## 2011-10-28 DIAGNOSIS — E861 Hypovolemia: Secondary | ICD-10-CM

## 2011-10-28 LAB — CBC WITH DIFFERENTIAL/PLATELET
BASO%: 0.4 % (ref 0.0–2.0)
Basophils Absolute: 0 10*3/uL (ref 0.0–0.1)
EOS%: 0.2 % (ref 0.0–7.0)
HCT: 31.2 % — ABNORMAL LOW (ref 34.8–46.6)
HGB: 10.6 g/dL — ABNORMAL LOW (ref 11.6–15.9)
LYMPH%: 35.9 % (ref 14.0–49.7)
MCH: 30 pg (ref 25.1–34.0)
MCHC: 34 g/dL (ref 31.5–36.0)
MCV: 88.4 fL (ref 79.5–101.0)
MONO%: 30.1 % — ABNORMAL HIGH (ref 0.0–14.0)
NEUT%: 33.4 % — ABNORMAL LOW (ref 38.4–76.8)

## 2011-10-28 LAB — COMPREHENSIVE METABOLIC PANEL
Albumin: 3.6 g/dL (ref 3.5–5.2)
Alkaline Phosphatase: 70 U/L (ref 39–117)
BUN: 16 mg/dL (ref 6–23)
Creatinine, Ser: 0.58 mg/dL (ref 0.50–1.10)
Glucose, Bld: 84 mg/dL (ref 70–99)
Potassium: 3.7 mEq/L (ref 3.5–5.3)
Total Bilirubin: 0.4 mg/dL (ref 0.3–1.2)

## 2011-10-28 MED ORDER — TRASTUZUMAB CHEMO INJECTION 440 MG
2.0000 mg/kg | Freq: Once | INTRAVENOUS | Status: AC
  Administered 2011-10-28: 210 mg via INTRAVENOUS
  Filled 2011-10-28: qty 10

## 2011-10-28 MED ORDER — DEXAMETHASONE SODIUM PHOSPHATE 4 MG/ML IJ SOLN: 20.0000 mg | Freq: Once | INTRAMUSCULAR | Status: DC

## 2011-10-28 MED ORDER — ACETAMINOPHEN 325 MG PO TABS
650.0000 mg | ORAL_TABLET | Freq: Once | ORAL | Status: AC
  Administered 2011-10-28: 650 mg via ORAL

## 2011-10-28 MED ORDER — ONDANSETRON 16 MG/50ML IVPB (CHCC): 16.0000 mg | Freq: Once | INTRAVENOUS | Status: DC

## 2011-10-28 MED ORDER — SODIUM CHLORIDE 0.9 % IV SOLN
INTRAVENOUS | Status: DC
  Administered 2011-10-28: 12:00:00 via INTRAVENOUS

## 2011-10-28 MED ORDER — DOCETAXEL CHEMO INJECTION 160 MG/16ML: 75.0000 mg/m2 | Freq: Once | INTRAVENOUS | Status: DC

## 2011-10-28 NOTE — Progress Notes (Signed)
[facility]  Name: [name]                  DATE: 10/28/2011 MRN: [id number]                      DOB: [birth year]  CC: [name], MD          REFERRING PHYSICIAN: [name], MD  DIAGNOSIS: Patient Active Problem List  Diagnoses Date Noted  . Breast cancer, stage 1 07/18/2011  Multicentric, ER/PR and HER-2 positive right breast carcinoma, status  post right simple mastectomy with axillary node dissection. Final  pathology revealed 1.9 cm, infiltrating ductal carcinoma with CISH ratio  of 9.3 and pathologic staging of T1c N0, Stage IA.     PRIOR THERAPY:  1. Right simple mastectomy with right axillary sentinel node biopsy and left subclavian power port insertion on 06/26/2011. Final pathology revealed a 1.9 cm invasive ductal carcinoma, ER/PR positive, HER-2/neu positive with a ratio of 9.73. Sentinel node biopsy negative. Pathologic stage TIcC. N0 M0, Stage IA right breast cancer.  2. Weekly Taxotere carboplatinum and Herceptin beginning November 2012. However,had considerable side effects requiring discontinuation/holding of the chemotherapy.   CURRENT THERAPY: Taxotere/carboplatinum every 21 days with weekly Herceptin starting 09/30/2011. Last Taxotere/Carbo treatment will be 3/20. Herceptin will continue for 1 year.   INTERIM HISTORY: Received Taxol/carbo last week. We are supporting her with Neulasta injection on Day 2 and fluids on Day 3. Has had complaints of dizziness and syncopal episodes with prior cycles. Last cycle, held HCTZ and hypovolemia was not a problem. Since she felt so well, she took HCTZ yesterday, and is hypovolemic today. States she is attempting to ensure adequate fluid intake. No nausea or vomiting. Some dyspnea with exertion. Appetite is fair, and bowel and bladder function are normal. No infections.    PHYSICAL EXAM: BP 95/62  Pulse 56  Temp 98.7 F (37.1 C)  Ht 5' 3.5" (1.613 m)  Wt 219 lb 1.6 oz (99.383 kg)  BMI 38.20  kg/m2 General: Well developed, well nourished, appears weak, no obvious distress. Alone at today's visit. EENT: No ocular or oral lesions. No stomatitis.  Respiratory: Lungs are clear to auscultation bilaterally with normal respiratory movement and no accessory muscle use. Cardiac: No murmur, rub or tachycardia. No upper or lower extremity edema.  GI: Abdomen is soft, no palpable hepatosplenomegaly. No fluid wave. No tenderness. Musculoskeletal: No kyphosis, no tenderness over the spine, ribs or hips. Lymph: No cervical, infraclavicular, axillary or inguinal adenopathy. Neuro: No focal neurological deficits. Psych: Alert and oriented X 3, appropriate mood and affect.    SOCIAL HISTORY:   . 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 Smoking status: Former Games developer  . Alcohol Use: 0.6 oz/week    1 Glasses of wine per week     LABORATORY STUDIES:   Results for orders placed in visit on 10/28/11  CBC WITH DIFFERENTIAL      Component Value Range   WBC 9.8  3.9 - 10.3 (10e3/uL)   NEUT# 3.3  1.5 - 6.5 (10e3/uL)   HGB 10.6 (*) 11.6 - 15.9 (g/dL)   HCT 40.9 (*) 81.1 - 46.6 (%)   Platelets 264  145 - 400 (10e3/uL)   MCV 88.4  79.5 - 101.0 (fL)   MCH 30.0  25.1 - 34.0 (pg)   MCHC 34.0  31.5 - 36.0 (g/dL)  RBC 3.53 (*) 3.70 - 5.45 (10e6/uL)   RDW 16.6 (*) 11.2 - 14.5 (%)   lymph# 3.5 (*) 0.9 - 3.3 (10e3/uL)   MONO# 2.9 (*) 0.1 - 0.9 (10e3/uL)   Eosinophils Absolute 0.0  0.0 - 0.5 (10e3/uL)   Basophils Absolute 0.0  0.0 - 0.1 (10e3/uL)   NEUT% 33.4 (*) 38.4 - 76.8 (%)   LYMPH% 35.9  14.0 - 49.7 (%)   MONO% 30.1 (*) 0.0 - 14.0 (%)   EOS% 0.2  0.0 - 7.0 (%)   BASO% 0.4  0.0 - 2.0 (%)   nRBC 0  0 - 0 (%)    IMPRESSION:  66 year old white female with: 1. Stage I right breast cancer, ER/PR positive, HER-2/neu positive, on treatment with poor tolerance. Dizziness and syncope improved after last treatment, held HCTZ.    2. Hypovolemic, blood pressure 95/62 after taking HCTZ yesterday. 3. Blood counts adequate to treat.  4. 3/20 will be final TC treatment.   PLAN:   1. Treatment today, Herceptin alone. Will add additional fluids to today's treatment.  2. Return next week for Herceptin alone, appt with me with lab.

## 2011-10-28 NOTE — Telephone Encounter (Signed)
gave patient appointments for 10-2011 thru 11-2011 printed out calendar and gave to the patient in the chemo room

## 2011-11-03 ENCOUNTER — Other Ambulatory Visit: Payer: Self-pay | Admitting: Certified Registered Nurse Anesthetist

## 2011-11-04 ENCOUNTER — Other Ambulatory Visit: Payer: Self-pay | Admitting: Oncology

## 2011-11-04 ENCOUNTER — Ambulatory Visit (HOSPITAL_BASED_OUTPATIENT_CLINIC_OR_DEPARTMENT_OTHER): Payer: BC Managed Care – PPO

## 2011-11-04 ENCOUNTER — Other Ambulatory Visit: Payer: BC Managed Care – PPO | Admitting: Lab

## 2011-11-04 ENCOUNTER — Ambulatory Visit (HOSPITAL_BASED_OUTPATIENT_CLINIC_OR_DEPARTMENT_OTHER): Payer: BC Managed Care – PPO | Admitting: Family

## 2011-11-04 ENCOUNTER — Encounter: Payer: Self-pay | Admitting: Family

## 2011-11-04 VITALS — BP 123/77 | HR 111 | Temp 98.0°F | Ht 63.5 in | Wt 221.2 lb

## 2011-11-04 DIAGNOSIS — C50919 Malignant neoplasm of unspecified site of unspecified female breast: Secondary | ICD-10-CM | POA: Diagnosis not present

## 2011-11-04 DIAGNOSIS — Z5112 Encounter for antineoplastic immunotherapy: Secondary | ICD-10-CM

## 2011-11-04 LAB — COMPREHENSIVE METABOLIC PANEL
ALT: 28 U/L (ref 0–35)
AST: 32 U/L (ref 0–37)
Alkaline Phosphatase: 79 U/L (ref 39–117)
BUN: 21 mg/dL (ref 6–23)
Calcium: 8.7 mg/dL (ref 8.4–10.5)
Chloride: 107 mEq/L (ref 96–112)
Creatinine, Ser: 0.64 mg/dL (ref 0.50–1.10)
Potassium: 4.2 mEq/L (ref 3.5–5.3)

## 2011-11-04 LAB — CBC WITH DIFFERENTIAL/PLATELET
BASO%: 0.2 % (ref 0.0–2.0)
EOS%: 0 % (ref 0.0–7.0)
MCH: 30.4 pg (ref 25.1–34.0)
MCHC: 33.9 g/dL (ref 31.5–36.0)
MONO#: 0.9 10*3/uL (ref 0.1–0.9)
RBC: 3.49 10*6/uL — ABNORMAL LOW (ref 3.70–5.45)
WBC: 8.9 10*3/uL (ref 3.9–10.3)
lymph#: 3.1 10*3/uL (ref 0.9–3.3)
nRBC: 0 % (ref 0–0)

## 2011-11-04 MED ORDER — SODIUM CHLORIDE 0.9 % IJ SOLN
10.0000 mL | INTRAMUSCULAR | Status: DC | PRN
  Administered 2011-11-04: 10 mL
  Filled 2011-11-04: qty 10

## 2011-11-04 MED ORDER — ACETAMINOPHEN 325 MG PO TABS
650.0000 mg | ORAL_TABLET | Freq: Once | ORAL | Status: AC
  Administered 2011-11-04: 650 mg via ORAL

## 2011-11-04 MED ORDER — TRASTUZUMAB CHEMO INJECTION 440 MG
2.0000 mg/kg | Freq: Once | INTRAVENOUS | Status: AC
  Administered 2011-11-04: 210 mg via INTRAVENOUS
  Filled 2011-11-04: qty 10

## 2011-11-04 MED ORDER — HEPARIN SOD (PORK) LOCK FLUSH 100 UNIT/ML IV SOLN
500.0000 [IU] | Freq: Once | INTRAVENOUS | Status: AC | PRN
  Administered 2011-11-04: 500 [IU]
  Filled 2011-11-04: qty 5

## 2011-11-04 MED ORDER — SODIUM CHLORIDE 0.9 % IV SOLN
Freq: Once | INTRAVENOUS | Status: AC
  Administered 2011-11-04: 11:00:00 via INTRAVENOUS

## 2011-11-04 NOTE — Patient Instructions (Addendum)
[facility] Discharge Instructions for Patients Receiving Chemotherapy  Today you received the following chemotherapy agents herceptin.  To help prevent nausea and vomiting after your treatment, we encourage you to take your nausea medication as prescribed by your physician.  If you develop nausea and vomiting that is not controlled by your nausea medication, call the clinic. If it is after clinic hours your family physician or the after hours number for the clinic or go to the Emergency Department.   BELOW ARE SYMPTOMS THAT SHOULD BE REPORTED IMMEDIATELY:  *FEVER GREATER THAN 100.5 F  *CHILLS WITH OR WITHOUT FEVER  NAUSEA AND VOMITING THAT IS NOT CONTROLLED WITH YOUR NAUSEA MEDICATION  *UNUSUAL SHORTNESS OF BREATH  *UNUSUAL BRUISING OR BLEEDING  TENDERNESS IN MOUTH AND THROAT WITH OR WITHOUT PRESENCE OF ULCERS  *URINARY PROBLEMS  *BOWEL PROBLEMS  UNUSUAL RASH Items with * indicate a potential emergency and should be followed up as soon as possible.   Feel free to call the clinic you have any questions or concerns. The clinic phone number is [phone].   I have been informed and understand all the instructions given to me. I know to contact the clinic, my physician, or go to the Emergency Department if any problems should occur. I do not have any questions at this time, but understand that I may call the clinic during office hours   should I have any questions or need assistance in obtaining follow up care.    __________________________________________  _____________  __________ Signature of Patient or Authorized Representative            Date                   Time    __________________________________________ Nurse's Signature

## 2011-11-04 NOTE — Assessment & Plan Note (Signed)
Duplicate entry

## 2011-11-04 NOTE — Progress Notes (Signed)
[facility]  Name: [name]                  DATE: 11/04/2011 MRN: [id number]                      DOB: 04/22/1946  CC: [name], MD          REFERRING PHYSICIAN: [name], MD  DIAGNOSIS: Patient Active Problem List  Diagnoses Date Noted  . Breast cancer, stage 1 07/18/2011  Multicentric, ER/PR and HER-2 positive right breast carcinoma, status  post right simple mastectomy with axillary node dissection. Final  pathology revealed 1.9 cm, infiltrating ductal carcinoma with CISH ratio  of 9.3 and pathologic staging of T1c N0, Stage IA.     PRIOR THERAPY:  1. Right simple mastectomy with right axillary sentinel node biopsy and left subclavian power port insertion on 06/26/2011. Final pathology revealed a 1.9 cm invasive ductal carcinoma, ER/PR positive, HER-2/neu positive with a ratio of 9.73. Sentinel node biopsy negative. Pathologic stage TIcC. N0 M0, Stage IA right breast cancer.  2. Weekly Taxotere carboplatinum and Herceptin beginning November 2012. However,had considerable side effects requiring discontinuation/holding of the chemotherapy.   CURRENT THERAPY: Taxotere/carboplatinum every 21 days with weekly Herceptin starting 09/30/2011. Last Taxotere/Carbo treatment will be 3/20. Herceptin will continue for 1 year.   INTERIM HISTORY: Received Taxol/carbo last week. We are supporting her with Neulasta injection on Day 2 and fluids on Day 3. Has had complaints of dizziness and syncopal episodes with prior cycles. Last cycle, held HCTZ and hypovolemia was not a problem. Since she felt so well, she took HCTZ yesterday, and is hypovolemic today. States she is attempting to ensure adequate fluid intake. No nausea or vomiting. Some dyspnea with exertion. Appetite is fair, and bowel and bladder function are normal. No infections.    PHYSICAL EXAM: BP 123/77  Pulse 111  Temp 98 F (36.7 C)  Ht 5' 3.5" (1.613 m)  Wt 221 lb 3.2 oz (100.336 kg)  BMI 38.57  kg/m2 General: Well developed, well nourished, appears weak, no obvious distress. Alone at today's visit. EENT: No ocular or oral lesions. No stomatitis.  Respiratory: Lungs are clear to auscultation bilaterally with normal respiratory movement and no accessory muscle use. Cardiac: No murmur, rub or tachycardia. No upper or lower extremity edema.  GI: Abdomen is soft, no palpable hepatosplenomegaly. No fluid wave. No tenderness. Musculoskeletal: No kyphosis, no tenderness over the spine, ribs or hips. Lymph: No cervical, infraclavicular, axillary or inguinal adenopathy. Neuro: No focal neurological deficits. Psych: Alert and oriented X 3, appropriate mood and affect.    SOCIAL HISTORY:   . 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 Smoking status: Former Games developer  . Alcohol Use: 0.6 oz/week    1 Glasses of wine per week     LABORATORY STUDIES:   Results for orders placed in visit on 11/04/11  CBC WITH DIFFERENTIAL      Component Value Range   WBC 8.9  3.9 - 10.3 (10e3/uL)   NEUT# 4.9  1.5 - 6.5 (10e3/uL)   HGB 10.6 (*) 11.6 - 15.9 (g/dL)   HCT 27.2 (*) 53.6 - 46.6 (%)   Platelets 105 (*) 145 - 400 (10e3/uL)   MCV 89.7  79.5 - 101.0 (fL)   MCH 30.4  25.1 - 34.0 (pg)   MCHC 33.9  31.5 - 36.0 (g/dL)  RBC 3.49 (*) 3.70 - 5.45 (10e6/uL)   RDW 18.1 (*) 11.2 - 14.5 (%)   lymph# 3.1  0.9 - 3.3 (10e3/uL)   MONO# 0.9  0.1 - 0.9 (10e3/uL)   Eosinophils Absolute 0.0  0.0 - 0.5 (10e3/uL)   Basophils Absolute 0.0  0.0 - 0.1 (10e3/uL)   NEUT% 54.9  38.4 - 76.8 (%)   LYMPH% 35.0  14.0 - 49.7 (%)   MONO% 9.9  0.0 - 14.0 (%)   EOS% 0.0  0.0 - 7.0 (%)   BASO% 0.2  0.0 - 2.0 (%)   nRBC 0  0 - 0 (%)    IMPRESSION:  66 year old white female with: 1. Stage I right breast cancer, ER/PR positive, HER-2/neu positive, on treatment with poor tolerance. Dizziness and syncope improved after last treatment, held HCTZ.   2.  Blood counts adequate to treat.  3. 3/20 will be final TC treatment. 4. Discontinued blood pressure medications for persistent hypotension. Blood pressure improved (123/77) on today's visit.    PLAN:   1. Treatment today, Herceptin alone.  2. Return next week for TC, appt with me and lab. That will be cycle 3/4 planned. Lab orders entered for that visit.

## 2011-11-04 NOTE — Progress Notes (Signed)
Patient states she is driving and is refusion the Benedryl 2/2 this medicine makes her very sleepy.

## 2011-11-05 ENCOUNTER — Ambulatory Visit: Payer: No Typology Code available for payment source

## 2011-11-11 ENCOUNTER — Other Ambulatory Visit: Payer: No Typology Code available for payment source | Admitting: Lab

## 2011-11-11 ENCOUNTER — Ambulatory Visit: Payer: No Typology Code available for payment source | Admitting: Oncology

## 2011-11-11 ENCOUNTER — Encounter: Payer: Self-pay | Admitting: Family

## 2011-11-11 ENCOUNTER — Ambulatory Visit (HOSPITAL_BASED_OUTPATIENT_CLINIC_OR_DEPARTMENT_OTHER): Payer: BC Managed Care – PPO | Admitting: Family

## 2011-11-11 ENCOUNTER — Ambulatory Visit (HOSPITAL_BASED_OUTPATIENT_CLINIC_OR_DEPARTMENT_OTHER): Payer: BC Managed Care – PPO

## 2011-11-11 ENCOUNTER — Other Ambulatory Visit (HOSPITAL_BASED_OUTPATIENT_CLINIC_OR_DEPARTMENT_OTHER): Payer: BC Managed Care – PPO | Admitting: Lab

## 2011-11-11 VITALS — BP 129/76 | HR 116 | Temp 98.4°F | Ht 63.5 in | Wt 222.0 lb

## 2011-11-11 DIAGNOSIS — Z5111 Encounter for antineoplastic chemotherapy: Secondary | ICD-10-CM

## 2011-11-11 DIAGNOSIS — R0989 Other specified symptoms and signs involving the circulatory and respiratory systems: Secondary | ICD-10-CM

## 2011-11-11 DIAGNOSIS — C50919 Malignant neoplasm of unspecified site of unspecified female breast: Secondary | ICD-10-CM | POA: Diagnosis not present

## 2011-11-11 DIAGNOSIS — R0609 Other forms of dyspnea: Secondary | ICD-10-CM | POA: Diagnosis not present

## 2011-11-11 DIAGNOSIS — E860 Dehydration: Secondary | ICD-10-CM

## 2011-11-11 LAB — CBC WITH DIFFERENTIAL/PLATELET
BASO%: 1 % (ref 0.0–2.0)
Basophils Absolute: 0.1 10*3/uL (ref 0.0–0.1)
EOS%: 0.2 % (ref 0.0–7.0)
HCT: 31.6 % — ABNORMAL LOW (ref 34.8–46.6)
HGB: 10.5 g/dL — ABNORMAL LOW (ref 11.6–15.9)
LYMPH%: 40.1 % (ref 14.0–49.7)
MCH: 31 pg (ref 25.1–34.0)
MCHC: 33.2 g/dL (ref 31.5–36.0)
MCV: 93.2 fL (ref 79.5–101.0)
MONO%: 15 % — ABNORMAL HIGH (ref 0.0–14.0)
NEUT%: 43.7 % (ref 38.4–76.8)
lymph#: 2.5 10*3/uL (ref 0.9–3.3)

## 2011-11-11 LAB — COMPREHENSIVE METABOLIC PANEL
ALT: 24 U/L (ref 0–35)
AST: 25 U/L (ref 0–37)
Alkaline Phosphatase: 61 U/L (ref 39–117)
BUN: 18 mg/dL (ref 6–23)
Calcium: 8.8 mg/dL (ref 8.4–10.5)
Creatinine, Ser: 0.61 mg/dL (ref 0.50–1.10)
Total Bilirubin: 0.5 mg/dL (ref 0.3–1.2)

## 2011-11-11 MED ORDER — SODIUM CHLORIDE 0.9 % IV SOLN
825.0000 mg | Freq: Once | INTRAVENOUS | Status: AC
  Administered 2011-11-11: 830 mg via INTRAVENOUS
  Filled 2011-11-11: qty 83

## 2011-11-11 MED ORDER — SODIUM CHLORIDE 0.9 % IV SOLN
Freq: Once | INTRAVENOUS | Status: AC
  Administered 2011-11-11: 11:00:00 via INTRAVENOUS

## 2011-11-11 MED ORDER — DEXAMETHASONE SODIUM PHOSPHATE 4 MG/ML IJ SOLN
20.0000 mg | Freq: Once | INTRAMUSCULAR | Status: AC
  Administered 2011-11-11: 20 mg via INTRAVENOUS

## 2011-11-11 MED ORDER — HEPARIN SOD (PORK) LOCK FLUSH 100 UNIT/ML IV SOLN
500.0000 [IU] | Freq: Once | INTRAVENOUS | Status: AC | PRN
  Administered 2011-11-11: 500 [IU]
  Filled 2011-11-11: qty 5

## 2011-11-11 MED ORDER — TRASTUZUMAB CHEMO INJECTION 440 MG
2.0000 mg/kg | Freq: Once | INTRAVENOUS | Status: AC
  Administered 2011-11-11: 210 mg via INTRAVENOUS
  Filled 2011-11-11: qty 10

## 2011-11-11 MED ORDER — SODIUM CHLORIDE 0.9 % IJ SOLN
10.0000 mL | INTRAMUSCULAR | Status: DC | PRN
  Administered 2011-11-11: 10 mL
  Filled 2011-11-11: qty 10

## 2011-11-11 MED ORDER — DOCETAXEL CHEMO INJECTION 160 MG/16ML
75.0000 mg/m2 | Freq: Once | INTRAVENOUS | Status: AC
  Administered 2011-11-11: 160 mg via INTRAVENOUS
  Filled 2011-11-11: qty 16

## 2011-11-11 MED ORDER — ACETAMINOPHEN 325 MG PO TABS
650.0000 mg | ORAL_TABLET | Freq: Once | ORAL | Status: AC
  Administered 2011-11-11: 650 mg via ORAL

## 2011-11-11 MED ORDER — DIPHENHYDRAMINE HCL 25 MG PO CAPS
50.0000 mg | ORAL_CAPSULE | Freq: Once | ORAL | Status: AC
  Administered 2011-11-11: 50 mg via ORAL

## 2011-11-11 MED ORDER — ONDANSETRON 16 MG/50ML IVPB (CHCC)
16.0000 mg | Freq: Once | INTRAVENOUS | Status: AC
  Administered 2011-11-11: 16 mg via INTRAVENOUS

## 2011-11-11 NOTE — Progress Notes (Signed)
[facility]  Name: [name]                  DATE: 11/11/2011 MRN: [id number]                      DOB: 12/13/1945  CC: [name], MD          REFERRING PHYSICIAN: [name], MD  DIAGNOSIS: Patient Active Problem List  Diagnoses Date Noted  . 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 Final  pathology revealed 1.9 cm, infiltrating ductal carcinoma with CISH ratio of 9.3 and pathologic staging of T1c N0, Stage IA.     PRIOR THERAPY:  1. Right simple mastectomy with right axillary sentinel node biopsy and left subclavian power port 06/26/2011. Final pathology revealed 1.9 cm invasive ductal carcinoma, ER/PR positive, HER-2/neu positive with a ratio of 9.73. Sentinel node biopsy negative. Pathologic stage TIcC. N0 M0, Stage IA right breast cancer.  2. Weekly Taxotere carboplatinum and Herceptin beginning November 2012. However, had considerable side effects requiring discontinuation/holding of the chemotherapy.   CURRENT THERAPY: Taxotere/carboplatinum every 21 days with weekly Herceptin starting 09/30/2011. Last Taxotere/Carbo treatment will be 3/20. Herceptin will continue for 1 year.   INTERIM HISTORY: Due for Taxol/carbo today. Receives Neulasta injection on Day 2 and fluids on Day 3. Questions whether these appointments can be combined to dispense with one trip to the Cancer Center.   Complaints of dizziness and syncopal episodes have improved after holding b/p medications. States she is attempting to ensure adequate fluid intake, but we are supporting her with IV fluids, also. No nausea or vomiting. Chief complaint is  dyspnea with exertion. States she has trouble walking her dogs. Appetite is fair, and bowel and bladder function are normal. No infections.   Has appt for echo March 5, next week.    PHYSICAL EXAM: BP 129/76  Pulse  116  Temp(Src) 98.4 F (36.9 C) (Oral)  Ht 5' 3.5" (1.613 m)  Wt 222 lb (100.699 kg)  BMI 38.71 kg/m2 O2 sat 97-98%.  General: Well developed, well nourished, appears weak, no obvious distress. Alone at today's visit. EENT: No ocular or oral lesions. No stomatitis.  Respiratory: Lungs are clear to auscultation bilaterally with normal respiratory movement and no accessory muscle use. Cardiac: No murmur, rub or tachycardia. No upper or lower extremity edema.  GI: Abdomen is soft, no palpable hepatosplenomegaly. No fluid wave. No tenderness. Musculoskeletal: No kyphosis, no tenderness over the spine, ribs or hips. Lymph: No cervical, infraclavicular, axillary or inguinal adenopathy. Neuro: No focal neurological deficits. Psych: Alert and oriented X 3, appropriate mood and affect.    SOCIAL HISTORY:  . 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 Smoking status: Former Games developer  . Alcohol Use: 0.6 oz/week    1 Glasses of wine per week   LABORATORY STUDIES:   Results for orders placed in visit on 11/11/11  CBC WITH DIFFERENTIAL      Component Value Range   WBC 6.2  3.9 - 10.3 (10e3/uL)   NEUT# 2.7  1.5 - 6.5 (10e3/uL)   HGB 10.5 (*) 11.6 - 15.9 (g/dL)   HCT 40.9 (*) 81.1 - 46.6 (%)   Platelets 177  145 - 400 (10e3/uL)   MCV 93.2  79.5 - 101.0 (fL)  MCH 31.0  25.1 - 34.0 (pg)   MCHC 33.2  31.5 - 36.0 (g/dL)   RBC 1.61 (*) 0.96 - 5.45 (10e6/uL)   RDW 20.2 (*) 11.2 - 14.5 (%)   lymph# 2.5  0.9 - 3.3 (10e3/uL)   MONO# 0.9  0.1 - 0.9 (10e3/uL)   Eosinophils Absolute 0.0  0.0 - 0.5 (10e3/uL)   Basophils Absolute 0.1  0.0 - 0.1 (10e3/uL)   NEUT% 43.7  38.4 - 76.8 (%)   LYMPH% 40.1  14.0 - 49.7 (%)   MONO% 15.0 (*) 0.0 - 14.0 (%)   EOS% 0.2  0.0 - 7.0 (%)   BASO% 1.0  0.0 - 2.0 (%)    IMPRESSION:  66 year old white female with: 1. Stage I right breast cancer, ER/PR positive, HER-2/neu positive, on treatment  with poor tolerance. Dizziness and syncope improved after discontinuing b/p meds. B/P has been in acceptable range without meds.    2. Blood counts adequate to treat.  3. 3/20 will be final TC treatment. 4. Dyspnea on exertion, O2 sats satisfactory today, even after walking the halls at a brisk pace for several minutes.    PLAN:   1. Treatment today, TCH.  2. Combine fluids and injection appt for Friday, March 1.  2. Return next week for Herceptin alone, appt with me and lab. Lab orders entered for that visit.

## 2011-11-11 NOTE — Patient Instructions (Signed)
We will combine fluids and injection appt for your convenience.  Keep appt for echo next week. Return next week for Herceptin alone.

## 2011-11-11 NOTE — Patient Instructions (Signed)
Pt discharged ambulatory. Pt to call with concerns 

## 2011-11-12 ENCOUNTER — Ambulatory Visit: Payer: No Typology Code available for payment source

## 2011-11-13 ENCOUNTER — Ambulatory Visit (HOSPITAL_BASED_OUTPATIENT_CLINIC_OR_DEPARTMENT_OTHER): Payer: BC Managed Care – PPO

## 2011-11-13 DIAGNOSIS — E860 Dehydration: Secondary | ICD-10-CM

## 2011-11-13 DIAGNOSIS — C50919 Malignant neoplasm of unspecified site of unspecified female breast: Secondary | ICD-10-CM | POA: Diagnosis not present

## 2011-11-13 MED ORDER — HEPARIN SOD (PORK) LOCK FLUSH 100 UNIT/ML IV SOLN
500.0000 [IU] | Freq: Once | INTRAVENOUS | Status: AC
  Administered 2011-11-13: 500 [IU] via INTRAVENOUS
  Filled 2011-11-13: qty 5

## 2011-11-13 MED ORDER — SODIUM CHLORIDE 0.9 % IV SOLN: INTRAVENOUS | Status: DC

## 2011-11-13 MED ORDER — SODIUM CHLORIDE 0.9 % IV SOLN
INTRAVENOUS | Status: DC
  Administered 2011-11-13: 11:00:00 via INTRAVENOUS

## 2011-11-13 MED ORDER — PEGFILGRASTIM INJECTION 6 MG/0.6ML
6.0000 mg | Freq: Once | SUBCUTANEOUS | Status: AC
  Administered 2011-11-13: 6 mg via SUBCUTANEOUS
  Filled 2011-11-13: qty 0.6

## 2011-11-13 MED ORDER — SODIUM CHLORIDE 0.9 % IJ SOLN
10.0000 mL | INTRAMUSCULAR | Status: DC | PRN
  Administered 2011-11-13: 10 mL via INTRAVENOUS
  Filled 2011-11-13: qty 10

## 2011-11-17 ENCOUNTER — Ambulatory Visit (HOSPITAL_COMMUNITY)
Admission: RE | Admit: 2011-11-17 | Discharge: 2011-11-17 | Disposition: A | Discharge status: Home / Self Care | Payer: BC Managed Care – PPO | Source: Ambulatory Visit | Attending: Internal Medicine | Admitting: Internal Medicine

## 2011-11-17 DIAGNOSIS — I079 Rheumatic tricuspid valve disease, unspecified: Secondary | ICD-10-CM | POA: Insufficient documentation

## 2011-11-17 DIAGNOSIS — I517 Cardiomegaly: Secondary | ICD-10-CM | POA: Diagnosis not present

## 2011-11-17 DIAGNOSIS — C50919 Malignant neoplasm of unspecified site of unspecified female breast: Secondary | ICD-10-CM | POA: Insufficient documentation

## 2011-11-17 DIAGNOSIS — Z09 Encounter for follow-up examination after completed treatment for conditions other than malignant neoplasm: Secondary | ICD-10-CM | POA: Insufficient documentation

## 2011-11-17 NOTE — Progress Notes (Signed)
*  PRELIMINARY RESULTS* Echocardiogram 2D Echocardiogram has been performed.  [name] [facility] 11/17/2011, 2:32 PM

## 2011-11-18 ENCOUNTER — Other Ambulatory Visit (HOSPITAL_BASED_OUTPATIENT_CLINIC_OR_DEPARTMENT_OTHER): Payer: BC Managed Care – PPO

## 2011-11-18 ENCOUNTER — Ambulatory Visit (HOSPITAL_BASED_OUTPATIENT_CLINIC_OR_DEPARTMENT_OTHER): Payer: BC Managed Care – PPO

## 2011-11-18 ENCOUNTER — Ambulatory Visit (HOSPITAL_BASED_OUTPATIENT_CLINIC_OR_DEPARTMENT_OTHER): Payer: BC Managed Care – PPO | Admitting: Family

## 2011-11-18 VITALS — BP 145/74 | HR 72

## 2011-11-18 DIAGNOSIS — C50919 Malignant neoplasm of unspecified site of unspecified female breast: Secondary | ICD-10-CM | POA: Diagnosis not present

## 2011-11-18 DIAGNOSIS — E861 Hypovolemia: Secondary | ICD-10-CM | POA: Diagnosis not present

## 2011-11-18 DIAGNOSIS — Z5112 Encounter for antineoplastic immunotherapy: Secondary | ICD-10-CM | POA: Diagnosis not present

## 2011-11-18 LAB — COMPREHENSIVE METABOLIC PANEL
AST: 26 U/L (ref 0–37)
Albumin: 3.5 g/dL (ref 3.5–5.2)
BUN: 18 mg/dL (ref 6–23)
CO2: 25 mEq/L (ref 19–32)
Calcium: 8.9 mg/dL (ref 8.4–10.5)
Chloride: 102 mEq/L (ref 96–112)
Glucose, Bld: 139 mg/dL — ABNORMAL HIGH (ref 70–99)
Potassium: 3.4 mEq/L — ABNORMAL LOW (ref 3.5–5.3)

## 2011-11-18 LAB — CBC WITH DIFFERENTIAL/PLATELET
Basophils Absolute: 0 10*3/uL (ref 0.0–0.1)
Eosinophils Absolute: 0 10*3/uL (ref 0.0–0.5)
HCT: 32.1 % — ABNORMAL LOW (ref 34.8–46.6)
HGB: 10.7 g/dL — ABNORMAL LOW (ref 11.6–15.9)
MCV: 93 fL (ref 79.5–101.0)
MONO%: 17.3 % — ABNORMAL HIGH (ref 0.0–14.0)
NEUT#: 1.1 10*3/uL — ABNORMAL LOW (ref 1.5–6.5)
Platelets: 215 10*3/uL (ref 145–400)
RDW: 18.6 % — ABNORMAL HIGH (ref 11.2–14.5)

## 2011-11-18 MED ORDER — HEPARIN SOD (PORK) LOCK FLUSH 100 UNIT/ML IV SOLN
500.0000 [IU] | Freq: Once | INTRAVENOUS | Status: AC | PRN
  Administered 2011-11-18: 500 [IU]
  Filled 2011-11-18: qty 5

## 2011-11-18 MED ORDER — SODIUM CHLORIDE 0.9 % IV SOLN
Freq: Once | INTRAVENOUS | Status: AC
  Administered 2011-11-18: 11:00:00 via INTRAVENOUS

## 2011-11-18 MED ORDER — ACETAMINOPHEN 325 MG PO TABS
650.0000 mg | ORAL_TABLET | Freq: Once | ORAL | Status: AC
  Administered 2011-11-18: 650 mg via ORAL

## 2011-11-18 MED ORDER — SODIUM CHLORIDE 0.9 % IJ SOLN
10.0000 mL | INTRAMUSCULAR | Status: DC | PRN
  Administered 2011-11-18: 10 mL
  Filled 2011-11-18: qty 10

## 2011-11-18 MED ORDER — TRASTUZUMAB CHEMO INJECTION 440 MG
2.0000 mg/kg | Freq: Once | INTRAVENOUS | Status: AC
  Administered 2011-11-18: 210 mg via INTRAVENOUS
  Filled 2011-11-18: qty 10

## 2011-11-18 MED ORDER — SODIUM CHLORIDE 0.9 % IV SOLN
500.0000 mL | Freq: Once | INTRAVENOUS | Status: AC
Start: 2011-11-18 — End: 2011-11-18
  Administered 2011-11-18: 500 mL via INTRAVENOUS

## 2011-11-18 MED ORDER — DIPHENHYDRAMINE HCL 25 MG PO CAPS: 50.0000 mg | ORAL_CAPSULE | Freq: Once | ORAL | Status: DC

## 2011-11-19 ENCOUNTER — Encounter: Payer: Self-pay | Admitting: Family

## 2011-11-19 ENCOUNTER — Telehealth: Payer: Self-pay | Admitting: *Deleted

## 2011-11-19 NOTE — Progress Notes (Signed)
[facility]  Name: [name]                  DATE: 11/19/2011 MRN: [id number]                      DOB: [birth year]  CC: [name], MD          REFERRING PHYSICIAN: [name], MD  DIAGNOSIS: Patient Active Problem List  Diagnoses Date Noted  . 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 Final  pathology revealed 1.9 cm, infiltrating ductal carcinoma with CISH ratio of 9.3 and pathologic staging of T1c N0, Stage IA.     PRIOR THERAPY:  1. Right simple mastectomy with right axillary sentinel node biopsy and left subclavian power port 06/26/2011. Final pathology revealed 1.9 cm invasive ductal carcinoma, ER/PR positive, HER-2/neu positive with a ratio of 9.73. Sentinel node biopsy negative. Pathologic stage TIcC. N0 M0, Stage IA right breast cancer.  2. Weekly Taxotere carboplatinum and Herceptin beginning November 2012. However, had considerable side effects requiring discontinuation/holding of the chemotherapy.   CURRENT THERAPY: Taxotere/carboplatinum every 21 days with weekly Herceptin starting 09/30/2011. Last Taxotere/Carbo treatment will be 3/20. Herceptin will continue for 1 year.   INTERIM HISTORY: Due for herceptin only today. Last Taxotere/Carbo 2/28 with Neulasta on Day 2.   Complains of dizziness and syncopal episodes, started several days ago. States she is attempting to ensure adequate fluid intake. No nausea or vomiting. Chief complaint is  dyspnea with exertion. States she has trouble walking her dogs. Appetite is fair, and bowel and bladder function are normal. No infections. Recent echo March 5 showed EF 55-60%, unchanged since initiating Herceptin.   PHYSICAL EXAM: BP 98/60  Pulse 78  Temp(Src) 98.4 F (36.9 C) (Oral)  Ht 5' 3.5" (1.613 m)  Wt 216 lb 9.6 oz (98.249 kg)  BMI 37.77 kg/m2 O2 sat 97-98%.  General: Well  developed, well nourished, appears weak, no obvious distress. Alone at today's visit. EENT: No ocular or oral lesions. No stomatitis.  Respiratory: Lungs are clear to auscultation bilaterally with normal respiratory movement and no accessory muscle use. Cardiac: No murmur, rub or tachycardia. No upper or lower extremity edema.  GI: Abdomen is soft, no palpable hepatosplenomegaly. No fluid wave. No tenderness. Musculoskeletal: No kyphosis, no tenderness over the spine, ribs or hips. Lymph: No cervical, infraclavicular, axillary or inguinal adenopathy. Neuro: No focal neurological deficits. Psych: Alert and oriented X 3, appropriate mood and affect.    SOCIAL HISTORY:  . 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 Smoking status: Former Games developer  . Alcohol Use: 0.6 oz/week    1 Glasses of wine per week   LABORATORY STUDIES:   Results for orders placed in visit on 11/18/11  CBC WITH DIFFERENTIAL      Component Value Range   WBC 3.7 (*) 3.9 - 10.3 (10e3/uL)   NEUT# 1.1 (*) 1.5 - 6.5 (10e3/uL)   HGB 10.7 (*) 11.6 - 15.9 (g/dL)   HCT 40.9 (*) 81.1 - 46.6 (%)   Platelets 215  145 - 400 (10e3/uL)   MCV 93.0  79.5 - 101.0 (fL)   MCH 31.0  25.1 - 34.0 (pg)   MCHC 33.3  31.5 - 36.0 (g/dL)   RBC 9.14 (*) 7.82 - 5.45 (10e6/uL)  RDW 18.6 (*) 11.2 - 14.5 (%)   lymph# 2.0  0.9 - 3.3 (10e3/uL)   MONO# 0.6  0.1 - 0.9 (10e3/uL)   Eosinophils Absolute 0.0  0.0 - 0.5 (10e3/uL)   Basophils Absolute 0.0  0.0 - 0.1 (10e3/uL)   NEUT% 28.8 (*) 38.4 - 76.8 (%)   LYMPH% 52.8 (*) 14.0 - 49.7 (%)   MONO% 17.3 (*) 0.0 - 14.0 (%)   EOS% 0.3  0.0 - 7.0 (%)   BASO% 0.8  0.0 - 2.0 (%)   nRBC 0  0 - 0 (%)  COMPREHENSIVE METABOLIC PANEL      Component Value Range   Sodium 138  135 - 145 (mEq/L)   Potassium 3.4 (*) 3.5 - 5.3 (mEq/L)   Chloride 102  96 - 112 (mEq/L)   CO2 25  19 - 32 (mEq/L)   Glucose, Bld 139 (*) 70 - 99 (mg/dL)   BUN  18  6 - 23 (mg/dL)   Creatinine, Ser 1.61  0.50 - 1.10 (mg/dL)   Total Bilirubin 0.7  0.3 - 1.2 (mg/dL)   Alkaline Phosphatase 70  39 - 117 (U/L)   AST 26  0 - 37 (U/L)   ALT 19  0 - 35 (U/L)   Total Protein 5.5 (*) 6.0 - 8.3 (g/dL)   Albumin 3.5  3.5 - 5.2 (g/dL)   Calcium 8.9  8.4 - 09.6 (mg/dL)    IMPRESSION:  66 year old white female with: 1. Stage I right breast cancer, ER/PR positive, HER-2/neu positive, on treatment with poor tolerance. Dizziness and syncope improved after discontinuing b/p meds. B/P has been in acceptable range without meds.    2. Neutropenic. 3. 3/20 will be final TC treatment. We will give fluids day 6 (Monday) following treatment.  4. Dyspnea on exertion. 5. Hypovolemic, B/P 98/60 with vertigo and weakness.   PLAN:   1. Treatment today, Herceptin only. We will add fluids to today's treatment.    2. Cipro 500 mg po bid for neutropenic prophylaxis.   2. Return 3/13 for appt with me prior to Herceptin alone. Lab orders entered for that visit. Next (final) Taxotere/Carbo treatment 3/20.

## 2011-11-19 NOTE — Telephone Encounter (Signed)
sent message back to nancy rudolph that dr.khan has no openings on 12-09-2011 has of right now patient is still on nancy rudolph schedule

## 2011-11-24 ENCOUNTER — Telehealth: Payer: Self-pay | Admitting: *Deleted

## 2011-11-24 ENCOUNTER — Encounter: Payer: Self-pay | Admitting: *Deleted

## 2011-11-24 ENCOUNTER — Other Ambulatory Visit: Payer: Self-pay | Admitting: Oncology

## 2011-11-24 NOTE — Telephone Encounter (Signed)
patient will be in on 11-25-2011 I will printed out calendar and gave to the patient on 11-25-2011

## 2011-11-25 ENCOUNTER — Ambulatory Visit (HOSPITAL_BASED_OUTPATIENT_CLINIC_OR_DEPARTMENT_OTHER): Payer: BC Managed Care – PPO | Admitting: Family

## 2011-11-25 ENCOUNTER — Telehealth: Payer: Self-pay | Admitting: Oncology

## 2011-11-25 ENCOUNTER — Other Ambulatory Visit (HOSPITAL_BASED_OUTPATIENT_CLINIC_OR_DEPARTMENT_OTHER): Payer: BC Managed Care – PPO | Admitting: Lab

## 2011-11-25 ENCOUNTER — Ambulatory Visit: Payer: No Typology Code available for payment source | Admitting: Oncology

## 2011-11-25 ENCOUNTER — Encounter: Payer: Self-pay | Admitting: Family

## 2011-11-25 ENCOUNTER — Ambulatory Visit (HOSPITAL_BASED_OUTPATIENT_CLINIC_OR_DEPARTMENT_OTHER): Payer: BC Managed Care – PPO

## 2011-11-25 VITALS — BP 129/77 | HR 118 | Temp 97.9°F | Ht 63.5 in | Wt 221.9 lb

## 2011-11-25 DIAGNOSIS — C50219 Malignant neoplasm of upper-inner quadrant of unspecified female breast: Secondary | ICD-10-CM

## 2011-11-25 DIAGNOSIS — C50919 Malignant neoplasm of unspecified site of unspecified female breast: Secondary | ICD-10-CM

## 2011-11-25 DIAGNOSIS — Z170 Estrogen receptor positive status [ER+]: Secondary | ICD-10-CM

## 2011-11-25 DIAGNOSIS — R0989 Other specified symptoms and signs involving the circulatory and respiratory systems: Secondary | ICD-10-CM | POA: Diagnosis not present

## 2011-11-25 DIAGNOSIS — R5381 Other malaise: Secondary | ICD-10-CM | POA: Diagnosis not present

## 2011-11-25 DIAGNOSIS — R0609 Other forms of dyspnea: Secondary | ICD-10-CM | POA: Diagnosis not present

## 2011-11-25 DIAGNOSIS — C50019 Malignant neoplasm of nipple and areola, unspecified female breast: Secondary | ICD-10-CM

## 2011-11-25 DIAGNOSIS — Z5112 Encounter for antineoplastic immunotherapy: Secondary | ICD-10-CM | POA: Diagnosis not present

## 2011-11-25 DIAGNOSIS — R5383 Other fatigue: Secondary | ICD-10-CM | POA: Diagnosis not present

## 2011-11-25 LAB — CBC WITH DIFFERENTIAL/PLATELET
BASO%: 0.1 % (ref 0.0–2.0)
Basophils Absolute: 0 10*3/uL (ref 0.0–0.1)
EOS%: 0 % (ref 0.0–7.0)
HGB: 10.1 g/dL — ABNORMAL LOW (ref 11.6–15.9)
MCH: 31.2 pg (ref 25.1–34.0)
MONO#: 0.7 10*3/uL (ref 0.1–0.9)
RDW: 20.1 % — ABNORMAL HIGH (ref 11.2–14.5)
WBC: 7.6 10*3/uL (ref 3.9–10.3)
lymph#: 3 10*3/uL (ref 0.9–3.3)

## 2011-11-25 LAB — COMPREHENSIVE METABOLIC PANEL
AST: 33 U/L (ref 0–37)
Albumin: 3.5 g/dL (ref 3.5–5.2)
Alkaline Phosphatase: 72 U/L (ref 39–117)
BUN: 15 mg/dL (ref 6–23)
Potassium: 3.6 mEq/L (ref 3.5–5.3)
Sodium: 140 mEq/L (ref 135–145)

## 2011-11-25 MED ORDER — HEPARIN SOD (PORK) LOCK FLUSH 100 UNIT/ML IV SOLN
500.0000 [IU] | Freq: Once | INTRAVENOUS | Status: AC | PRN
  Administered 2011-11-25: 500 [IU]
  Filled 2011-11-25: qty 5

## 2011-11-25 MED ORDER — ACETAMINOPHEN 325 MG PO TABS
650.0000 mg | ORAL_TABLET | Freq: Once | ORAL | Status: AC
  Administered 2011-11-25: 650 mg via ORAL

## 2011-11-25 MED ORDER — DIPHENHYDRAMINE HCL 25 MG PO CAPS
50.0000 mg | ORAL_CAPSULE | Freq: Once | ORAL | Status: AC
  Administered 2011-11-25: 50 mg via ORAL

## 2011-11-25 MED ORDER — SODIUM CHLORIDE 0.9 % IV SOLN
Freq: Once | INTRAVENOUS | Status: AC
  Administered 2011-11-25: 11:00:00 via INTRAVENOUS

## 2011-11-25 MED ORDER — SODIUM CHLORIDE 0.9 % IJ SOLN
10.0000 mL | INTRAMUSCULAR | Status: DC | PRN
  Administered 2011-11-25: 10 mL
  Filled 2011-11-25: qty 10

## 2011-11-25 MED ORDER — TRASTUZUMAB CHEMO INJECTION 440 MG
2.0000 mg/kg | Freq: Once | INTRAVENOUS | Status: AC
  Administered 2011-11-25: 210 mg via INTRAVENOUS
  Filled 2011-11-25: qty 10

## 2011-11-25 NOTE — Progress Notes (Signed)
[facility]  Name: [name]                  DATE: 11/25/2011 MRN: [id number]                      DOB: 11-Sep-1946  CC: Janet Rowan, MD          REFERRING PHYSICIAN: [name], MD  DIAGNOSIS: Patient Active Problem List  Diagnoses Date Noted  . 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 Final  pathology revealed 1.9 cm, infiltrating ductal carcinoma with CISH ratio of 9.3 and pathologic staging of T1c N0, Stage IA.    PRIOR THERAPY:  1. Right simple mastectomy with right axillary sentinel node biopsy and left subclavian power port 06/26/2011. Final pathology revealed 1.9 cm invasive ductal carcinoma, ER/PR positive, HER-2/neu positive with a ratio of 9.73. Sentinel node biopsy negative. Pathologic stage TIcC. N0 M0, Stage IA right breast cancer.  2. Weekly Taxotere carboplatinum and Herceptin beginning November 2012. However, had considerable side effects requiring discontinuation/holding of the chemotherapy.   CURRENT THERAPY: Taxotere/carboplatinum every 21 days with weekly Herceptin starting 09/30/2011. Herceptin will continue for 1 year.   INTERIM HISTORY: Due for herceptin only today. After discussion with Dr. Welton Flakes about toxicities and difficulty tolerating treatment, the decision was made to cancel final round of chemo.  Complains of dizziness and syncopal episodes, has never abated since last chemo Feb 27. States she is attempting to ensure adequate fluid intake. No nausea or vomiting. Chief complaint is dyspnea with exertion. Has difficulty walking into work. O2 sat with exertion last week was 96-98%. Appetite is fair, and bowel and bladder function are normal. No infections. Recent echo March 5 showed EF 55-60%, unchanged since initiating Herceptin.   PHYSICAL EXAM: BP 129/77  Pulse 118  Temp(Src) 97.9 F (36.6 C) (Oral)  Ht 5'  3.5" (1.613 m)  Wt 221 lb 14.4 oz (100.653 kg)  BMI 38.69 kg/m2 O2 sat 97-98%.  General: Well developed, well nourished, appears weak, no obvious distress. Alone at today's visit. EENT: No ocular or oral lesions. No stomatitis.  Respiratory: Lungs are clear to auscultation bilaterally with normal respiratory movement and no accessory muscle use. Cardiac: No murmur, rub or tachycardia. No upper or lower extremity edema.  GI: Abdomen is soft, no palpable hepatosplenomegaly. No fluid wave. No tenderness. Musculoskeletal: No kyphosis, no tenderness over the spine, ribs or hips. Lymph: No cervical, infraclavicular, axillary or inguinal adenopathy. Neuro: No focal neurological deficits. Psych: Alert and oriented X 3, appropriate mood and affect.    SOCIAL HISTORY:  . 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 Smoking status: Former Games developer  . Alcohol Use: 0.6 oz/week    1 Glasses of wine per week   LABORATORY STUDIES:   Results for orders placed in visit on 11/25/11  CBC WITH DIFFERENTIAL      Component Value Range   WBC 7.6  3.9 - 10.3 (10e3/uL)   NEUT# 4.0  1.5 - 6.5 (10e3/uL)   HGB 10.1 (*) 11.6 - 15.9 (g/dL)   HCT 40.9 (*) 81.1 - 46.6 (%)   Platelets 82 (*) 145 - 400 (10e3/uL)   MCV 96.0  79.5 - 101.0 (fL)   MCH 31.2  25.1 - 34.0 (pg)   MCHC 32.5  31.5 -  36.0 (g/dL)   RBC 1.61 (*) 0.96 - 5.45 (10e6/uL)   RDW 20.1 (*) 11.2 - 14.5 (%)   lymph# 3.0  0.9 - 3.3 (10e3/uL)   MONO# 0.7  0.1 - 0.9 (10e3/uL)   Eosinophils Absolute 0.0  0.0 - 0.5 (10e3/uL)   Basophils Absolute 0.0  0.0 - 0.1 (10e3/uL)   NEUT% 52.3  38.4 - 76.8 (%)   LYMPH% 38.6  14.0 - 49.7 (%)   MONO% 9.0  0.0 - 14.0 (%)   EOS% 0.0  0.0 - 7.0 (%)   BASO% 0.1  0.0 - 2.0 (%)   nRBC 0  0 - 0 (%)    IMPRESSION:  66 year old white female with: 1. Stage I right breast cancer, ER/PR positive, HER-2/neu positive, on treatment with poor tolerance.  Dizziness and syncope improved after discontinuing b/p meds. B/P has been in acceptable range without meds.    2. Dyspnea on exertion. 3. Weakness and fatigue.   PLAN:   1. Treatment today, Herceptin only.  2. Cancel final round of TC. Will continue Herceptin only every three weeks.  3. Return 3/20 for appt with Dr. Welton Flakes prior to Herceptin alone. Dr. Welton Flakes will change treatment plan to every 3 week Herceptin. Lab orders entered for that visit. 4. Hold B/P meds until further notice. Will monitor blood pressure since discontinuing chemo.

## 2011-11-25 NOTE — Telephone Encounter (Signed)
lmonvm for the pt to pick up a revised appt calendar the next time she comes in to the office

## 2011-11-25 NOTE — Patient Instructions (Signed)
[facility] Discharge Instructions for Patients Receiving Chemotherapy  Today you received the following chemotherapy agent: Herceptin  To help prevent nausea and vomiting after your treatment, we encourage you to take your nausea medication if needed : Zofran 8 mg every 8 hours and Compazine 10 mg every 6 hours as needed   If you develop nausea and vomiting that is not controlled by your nausea medication, call the clinic. If it is after clinic hours your family physician or the after hours number for the clinic or go to the Emergency Department.   BELOW ARE SYMPTOMS THAT SHOULD BE REPORTED IMMEDIATELY:  *FEVER GREATER THAN 100.5 F  *CHILLS WITH OR WITHOUT FEVER  NAUSEA AND VOMITING THAT IS NOT CONTROLLED WITH YOUR NAUSEA MEDICATION  *UNUSUAL SHORTNESS OF BREATH  *UNUSUAL BRUISING OR BLEEDING  TENDERNESS IN MOUTH AND THROAT WITH OR WITHOUT PRESENCE OF ULCERS  *URINARY PROBLEMS  *BOWEL PROBLEMS  UNUSUAL RASH Items with * indicate a potential emergency and should be followed up as soon as possible.   Feel free to call the clinic you have any questions or concerns. The clinic phone number is [phone].   I have been informed and understand all the instructions given to me. I know to contact the clinic, my physician, or go to the Emergency Department if any problems should occur. I do not have any questions at this time, but understand that I may call the clinic during office hours   should I have any questions or need assistance in obtaining follow up care.    __________________________________________  _____________  __________ Signature of Patient or Authorized Representative            Date                   Time    __________________________________________ Nurse's Signature

## 2011-11-26 ENCOUNTER — Ambulatory Visit: Payer: No Typology Code available for payment source

## 2011-11-27 ENCOUNTER — Encounter (HOSPITAL_COMMUNITY): Payer: BC Managed Care – PPO

## 2011-12-01 ENCOUNTER — Ambulatory Visit (HOSPITAL_COMMUNITY)
Admission: RE | Admit: 2011-12-01 | Discharge: 2011-12-01 | Disposition: A | Discharge status: Home / Self Care | Payer: BC Managed Care – PPO | Source: Ambulatory Visit | Attending: Internal Medicine | Admitting: Internal Medicine

## 2011-12-01 VITALS — BP 130/72 | HR 94 | Wt 227.0 lb

## 2011-12-01 DIAGNOSIS — C50919 Malignant neoplasm of unspecified site of unspecified female breast: Secondary | ICD-10-CM | POA: Diagnosis not present

## 2011-12-01 DIAGNOSIS — I509 Heart failure, unspecified: Secondary | ICD-10-CM | POA: Insufficient documentation

## 2011-12-01 MED ORDER — HYDROCHLOROTHIAZIDE 25 MG PO TABS: 12.5000 mg | ORAL_TABLET | Freq: Every day | ORAL | Status: DC

## 2011-12-01 NOTE — Progress Notes (Signed)
Patient ID: [name], female   DOB: May 13, 1946, 66 y.o.   MRN: [id number] Referring Physician: Drue Second ,MD Primary Physician: Mertha Finders, MD  Reason for Consultation: Herceptin HPI: [name] is a 66 year old woman with history of multicentric ER/PR, HER2-positive right breast carcinoma, status post right simple mastectomy with axillary node dissection on 06/26/11 with final path revealing 1.9-cm invasive ductal carcinoma with a Cage ratio of 9.73, pathologic staging of T1c N0. She has hsitory of depression, HTN, Hyperlipidemia, and fibromyalgia.  Diagnosed with her breast CA in September 2012. In November, started Taxotere, carboplatinum and Herceptin.    11/12 ECHO  EF 55-60% grade I diastolic dysfunction Lateral s' 11.4 cm/s 11/18/11 ECHO EF 60% Lateral S' 11.7  Complains SOB on exertion and fatigue. She has stopped her blood pressure medication over a month ago due to hypotension. Denies lower extremity edema. Ambulates with roll walker.    Review of Systems:     Cardiac Review of Systems: {Y] = yes [ ]  = no  Chest Pain [    ]  Resting SOB [   ] Exertional SOB  [Y  ]  Orthopnea [  ]   Pedal Edema [   ]    Palpitations [  ] Syncope  [  ]   Presyncope [   ]  General Review of Systems: [Y] = yes [  ]=no Constitional: recent weight change [  ]; anorexia [  ]; fatigue [ Y ]; nausea [  ]; night sweats [  ]; fever [  ]; or chills [  ];                                                                                                                                          Dental: poor dentition[  ];   Eye : blurred vision [  ]; diplopia [   ]; vision changes [  ];  Amaurosis fugax[  ]; Resp: cough [  ];  wheezing[  ];  hemoptysis[  ]; shortness of breath[  ]; paroxysmal nocturnal dyspnea[  ]; dyspnea on exertion[  ]; or orthopnea[  ];  GI:  gallstones[  ], vomiting[  ];  dysphagia[  ]; melena[  ];  hematochezia [  ]; heartburn[  ];   Hx of  Colonoscopy[  ]; GU: kidney stones [  ]; hematuria[   ];   dysuria [  ];  nocturia[  ];  history of     obstruction [  ];                 Skin: rash, swelling[  ];, hair loss[  ];  peripheral edema[  ];  or itching[  ]; Musculosketetal: myalgias[Y  ];  joint swelling[  ];  joint erythema[  ];  joint pain[Y  ];  back pain[  ];  Heme/Lymph: bruising[  ];  bleeding[  ];  anemia[  ];  Neuro: TIA[  ];  headaches[  ];  stroke[  ];  vertigo[  ];  seizures[  ];   paresthesias[  ];  difficulty walking[  ];  Psych:depression[Y  ]; anxiety[  ];  Endocrine: diabetes[  ];  thyroid dysfunction[  ];  Immunizations: Flu [  ]; Pneumococcal[  ];  Other:  Past Medical History  Diagnosis Date  . Allergy   . Asthma   . Hypertension   . Hyperlipidemia   . Depression   . Fibromyalgia   . Arthritis     feet, knee and base of thumbs  . Osteopenia   . Carpal tunnel syndrome     right  . Breast cancer, stage 1 07/18/2011  . Dehydration 10/21/2011    Medications Prior to Admission  Medication Sig Dispense Refill  . alendronate (FOSAMAX) 70 MG tablet Take 70 mg by mouth every 7 (seven) days. Take with a full glass of water on an empty stomach.      Marland Kitchen atorvastatin (LIPITOR) 20 MG tablet Take 20 mg by mouth daily.        . calcium gluconate 500 MG tablet Take 500 mg by mouth daily.        . cholecalciferol (VITAMIN D) 1000 UNITS tablet Take 2,000 Units by mouth daily.        . diphenhydrAMINE (SOMINEX) 25 MG tablet Take 25 mg by mouth at bedtime as needed.        . DULoxetine (CYMBALTA) 60 MG capsule Take 120 mg by mouth daily.       Marland Kitchen gabapentin (NEURONTIN) 600 MG tablet Take 600 mg by mouth daily.       . hydrochlorothiazide (HYDRODIURIL) 25 MG tablet Take 25 mg by mouth daily.      Boris Lown Oil 300 MG CAPS Take 1 capsule by mouth daily.        Marland Kitchen lidocaine-prilocaine (EMLA) cream Apply 1 application topically as needed.        . loratadine (CLARITIN) 10 MG tablet Take 10 mg by mouth daily.        . magnesium 30 MG tablet Take 500 mg by mouth daily.       .  Multiple Vitamin (MULTIVITAMIN) capsule Take 1 capsule by mouth daily.        . ondansetron (ZOFRAN) 8 MG tablet Take 8 mg by mouth as directed.        Marland Kitchen Prochlorperazine Maleate (COMPAZINE PO) Take 10 mg by mouth.        . traMADol (ULTRAM) 50 MG tablet Take 50 mg by mouth as needed.         No current facility-administered medications on file as of 12/01/2011.     No Known Allergies  History   Social History  . Marital Status: Single    Spouse Name: N/A    Number of Children: N/A  . Years of Education: N/A   Occupational History  . Not on file.   Social History Main Topics  . Smoking status: Former Games developer  . Smokeless tobacco: Not on file  . Alcohol Use: 0.6 oz/week    1 Glasses of wine per week  . Drug Use: Not on file  . Sexually Active: Not on file   Other Topics Concern  . Not on file   Social History Narrative  . No narrative on file    Family History  Problem Relation Age of Onset  . Breast cancer Paternal Aunt   .  Breast cancer Paternal Grandmother     PHYSICAL EXAM: Filed Vitals:   12/01/11 1256  BP: 130/72  Pulse: 94  227 (216) General: Ill  appearing. No respiratory difficulty HEENT: normal Neck: supple. no JVD. Carotids 2+ bilat; no bruits. No lymphadenopathy or thryomegaly appreciated. Cor: PMI nondisplaced. Mildly irregular. No rubs, gallops or murmurs. Lungs: clear Abdomen: soft, nontender, nondistended. No hepatosplenomegaly. No bruits or masses. Good bowel sounds. Extremities: no cyanosis, clubbing, rash, edema Neuro: alert & oriented x 3, cranial nerves grossly intact. moves all 4 extremities w/o difficulty. Affect pleasant.  EKG: SR with PVCs   ASSESSMENT/PLAN:

## 2011-12-01 NOTE — Patient Instructions (Signed)
Take HCTZ 12.5 mg daily  Follow up in 3 months with an ECHO

## 2011-12-01 NOTE — Assessment & Plan Note (Addendum)
Mild dyspnea on exertion however ECHO reviewed and remains stable. Trace lower extremity noted . Will add back HCTZ 12.5 mg daily and stop if SBP <90. May continue breast cancer treatment. Follow up in 3 months with an ECHO.  Patient seen and examined with Tonye Becket, NP. We discussed all aspects of the encounter. I agree with the assessment and plan as stated above. [name] has very mild volume overload on exam, but we reviewed echo personally and all parameters stable. Will continue Herceptin. Add low-dose diuretic watching BP closely.

## 2011-12-02 ENCOUNTER — Other Ambulatory Visit (HOSPITAL_BASED_OUTPATIENT_CLINIC_OR_DEPARTMENT_OTHER): Payer: BC Managed Care – PPO | Admitting: Lab

## 2011-12-02 ENCOUNTER — Ambulatory Visit: Payer: No Typology Code available for payment source | Admitting: Family

## 2011-12-02 ENCOUNTER — Encounter: Payer: Self-pay | Admitting: Oncology

## 2011-12-02 ENCOUNTER — Telehealth: Payer: Self-pay | Admitting: Oncology

## 2011-12-02 ENCOUNTER — Ambulatory Visit (HOSPITAL_BASED_OUTPATIENT_CLINIC_OR_DEPARTMENT_OTHER): Payer: BC Managed Care – PPO | Admitting: Oncology

## 2011-12-02 ENCOUNTER — Encounter (HOSPITAL_COMMUNITY)
Admission: RE | Admit: 2011-12-02 | Discharge: 2011-12-02 | Disposition: A | Discharge status: Home / Self Care | Payer: BC Managed Care – PPO | Source: Ambulatory Visit | Attending: Oncology | Admitting: Oncology

## 2011-12-02 ENCOUNTER — Ambulatory Visit (HOSPITAL_BASED_OUTPATIENT_CLINIC_OR_DEPARTMENT_OTHER): Payer: BC Managed Care – PPO

## 2011-12-02 VITALS — BP 133/77 | HR 112 | Temp 98.4°F | Ht 63.5 in | Wt 224.2 lb

## 2011-12-02 DIAGNOSIS — Z170 Estrogen receptor positive status [ER+]: Secondary | ICD-10-CM | POA: Diagnosis not present

## 2011-12-02 DIAGNOSIS — C50919 Malignant neoplasm of unspecified site of unspecified female breast: Secondary | ICD-10-CM

## 2011-12-02 DIAGNOSIS — D649 Anemia, unspecified: Secondary | ICD-10-CM

## 2011-12-02 DIAGNOSIS — Z5112 Encounter for antineoplastic immunotherapy: Secondary | ICD-10-CM

## 2011-12-02 LAB — CBC WITH DIFFERENTIAL/PLATELET
BASO%: 0.3 % (ref 0.0–2.0)
Eosinophils Absolute: 0 10*3/uL (ref 0.0–0.5)
MCV: 98.7 fL (ref 79.5–101.0)
MONO#: 0.7 10*3/uL (ref 0.1–0.9)
MONO%: 11.9 % (ref 0.0–14.0)
NEUT#: 2.9 10*3/uL (ref 1.5–6.5)
RBC: 3.13 10*6/uL — ABNORMAL LOW (ref 3.70–5.45)
RDW: 21.6 % — ABNORMAL HIGH (ref 11.2–14.5)
WBC: 5.7 10*3/uL (ref 3.9–10.3)
nRBC: 0 % (ref 0–0)

## 2011-12-02 LAB — COMPREHENSIVE METABOLIC PANEL
Albumin: 3.4 g/dL — ABNORMAL LOW (ref 3.5–5.2)
CO2: 23 mEq/L (ref 19–32)
Chloride: 104 mEq/L (ref 96–112)
Glucose, Bld: 100 mg/dL — ABNORMAL HIGH (ref 70–99)
Potassium: 3.3 mEq/L — ABNORMAL LOW (ref 3.5–5.3)
Sodium: 141 mEq/L (ref 135–145)
Total Protein: 5.2 g/dL — ABNORMAL LOW (ref 6.0–8.3)

## 2011-12-02 LAB — PREPARE RBC (CROSSMATCH)

## 2011-12-02 MED ORDER — HEPARIN SOD (PORK) LOCK FLUSH 100 UNIT/ML IV SOLN
500.0000 [IU] | Freq: Once | INTRAVENOUS | Status: AC | PRN
  Administered 2011-12-02: 500 [IU]
  Filled 2011-12-02: qty 5

## 2011-12-02 MED ORDER — SODIUM CHLORIDE 0.9 % IJ SOLN: 10.0000 mL | INTRAMUSCULAR | Status: DC | PRN

## 2011-12-02 MED ORDER — SODIUM CHLORIDE 0.9 % IJ SOLN
10.0000 mL | INTRAMUSCULAR | Status: DC | PRN
  Filled 2011-12-02: qty 10

## 2011-12-02 MED ORDER — ACETAMINOPHEN 325 MG PO TABS: 650.0000 mg | ORAL_TABLET | Freq: Once | ORAL | Status: DC

## 2011-12-02 MED ORDER — TRASTUZUMAB CHEMO INJECTION 440 MG
6.0000 mg/kg | Freq: Once | INTRAVENOUS | Status: AC
  Administered 2011-12-02: 609 mg via INTRAVENOUS
  Filled 2011-12-02: qty 29

## 2011-12-02 MED ORDER — SODIUM CHLORIDE 0.9 % IV SOLN: Freq: Once | INTRAVENOUS | Status: DC

## 2011-12-02 MED ORDER — SODIUM CHLORIDE 0.9 % IJ SOLN
10.0000 mL | INTRAMUSCULAR | Status: DC | PRN
  Administered 2011-12-02: 10 mL
  Filled 2011-12-02: qty 10

## 2011-12-02 MED ORDER — DIPHENHYDRAMINE HCL 25 MG PO CAPS: 50.0000 mg | ORAL_CAPSULE | Freq: Once | ORAL | Status: DC

## 2011-12-02 NOTE — Telephone Encounter (Signed)
lmonvm adviisng the pt to pick up her may 2013 appt calendar

## 2011-12-02 NOTE — Progress Notes (Signed)
OFFICE PROGRESS NOTE  CC [name] M.D. [name] M.D.  [facility], MD, MD [address], [address] [place] [address]  DIAGNOSIS: 66 year old female with multicentric right breast cancer that was ER positive PR positive HER-2/neu positive clinical stage I.  PRIOR THERAPY:  #1 status post right simple mastectomy with right axillary sentinel node biopsy and left subclavian power port insertion on 06/26/2011.  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 Sentinel node biopsy done at the time of her mastectomy was negative for metastatic disease. Pathologic stage TI C. N0 M0, stage I breast cancer.  #3 patient has been receiving weekly Taxotere carboplatinum and Herceptin beginning November 2012. However she has had considerable side effects requiring discontinuation/holding of the chemotherapy.  #4 S/P Taxotere/Crboplatin q 3 weeks with weekly Herceptin beginning 09/30/11 x 3 cycles completed on 11/11/11  #5. Herceptin q 21 days begin 12/02/11  CURRENT THERAPY: Herceptin q 21 days starting today 12/02/11  INTERVAL HISTORY: REONA ZENDEJAS 66 y.o. female followup visit today for her scheduled chemotherapy. She is extremely tired likely all related to her taxotere therapy. We have discontinued her therapy with carb/taxotere. She completed almosst a total of 4 cycles of TCH combination therapy. She is having shortness of breath, she is seen by Dr. Gala Romney and had recently had an echo that showed a normal EF. She however did have some fluid and she was begun on diuretics. She has no bleeding. Her hemoglobin is 10.2 but she is symptomatic from this. Remainder of the 10 point point review of systems is negative.Marland Kitchen  MEDICAL HISTORY: Past Medical History  Diagnosis Date  . Allergy   . Asthma   . Hypertension   . Hyperlipidemia   . Depression   . Fibromyalgia   . Arthritis     feet,  knee and base of thumbs  . Osteopenia   . Carpal tunnel syndrome     right  . Breast cancer, stage 1 07/18/2011  . Dehydration 10/21/2011    ALLERGIES:   has no known allergies.  MEDICATIONS:  Current Outpatient Prescriptions  Medication Sig Dispense Refill  . alendronate (FOSAMAX) 70 MG tablet Take 70 mg by mouth every 7 (seven) days. Take with a full glass of water on an empty stomach.      Marland Kitchen atorvastatin (LIPITOR) 20 MG tablet Take 20 mg by mouth daily.        . calcium gluconate 500 MG tablet Take 500 mg by mouth daily.        . cholecalciferol (VITAMIN D) 1000 UNITS tablet Take 2,000 Units by mouth daily.        . DULoxetine (CYMBALTA) 60 MG capsule Take 120 mg by mouth daily.       . hydrochlorothiazide (HYDRODIURIL) 25 MG tablet Take 0.5 tablets (12.5 mg total) by mouth daily.  30 tablet  6  . Krill Oil 300 MG CAPS Take 1 capsule by mouth daily.        Marland Kitchen lidocaine-prilocaine (EMLA) cream Apply 1 application topically as needed.        . loratadine (CLARITIN) 10 MG tablet Take 10 mg by mouth daily.        . magnesium 30 MG tablet Take 500 mg by mouth daily.       . Multiple Vitamin (MULTIVITAMIN) capsule Take 1 capsule by mouth daily.        . traMADol (ULTRAM) 50 MG  tablet Take 50 mg by mouth as needed.        . gabapentin (NEURONTIN) 600 MG tablet Take 600 mg by mouth daily.       . ondansetron (ZOFRAN) 8 MG tablet Take 8 mg by mouth as directed.        Marland Kitchen Prochlorperazine Maleate (COMPAZINE PO) Take 10 mg by mouth.         No current facility-administered medications for this visit.   Facility-Administered Medications Ordered in Other Visits  Medication Dose Route Frequency Provider Last Rate Last Dose  . 0.9 %  sodium chloride infusion   Intravenous Once Victorino December, MD      . acetaminophen (TYLENOL) tablet 650 mg  650 mg Oral Once Victorino December, MD      . diphenhydrAMINE (BENADRYL) capsule 50 mg  50 mg Oral Once Victorino December, MD      . heparin lock flush 100 unit/mL   500 Units Intracatheter Once PRN Victorino December, MD      . sodium chloride 0.9 % injection 10 mL  10 mL Intracatheter PRN Victorino December, MD      . sodium chloride 0.9 % injection 10 mL  10 mL Intracatheter PRN Victorino December, MD      . trastuzumab (HERCEPTIN) 609 mg in sodium chloride 0.9 % 250 mL chemo infusion  6 mg/kg (Treatment Plan Actual) Intravenous Once Victorino December, MD 558 mL/hr at 12/02/11 1156 609 mg at 12/02/11 1156  . DISCONTD: sodium chloride 0.9 % injection 10 mL  10 mL Intracatheter PRN Victorino December, MD      . DISCONTD: sodium chloride 0.9 % injection 10 mL  10 mL Intracatheter PRN Victorino December, MD        SURGICAL HISTORY:  Past Surgical History  Procedure Date  . Tonsillectomy 1950  . Tubal ligation 1978  . Abdominal hysterectomy 1981  . Right ear surgery 1983    tumor removed (?)  . Right breast lumpectomy 1986  . Ethmoidectomy 1991  . Knee arthroscopy 1996, 2001    left  . Facial plastic surgery 1996  . Bunionectomy 1998    left foot  . Total knee arthroplasty 2006    left  . Nasal sinus surgery 2001  . Lap band surgery 2010  . Right mastectomy 06-26-11  . Port-a-cath placement 06/26/11    tip in lower SVC per chest x-ray read by Dr. Dwyane Dee    REVIEW OF SYSTEMS:  A comprehensive review of systems was negative.   PHYSICAL EXAMINATION: General appearance: alert, cooperative and no distress Neck: no adenopathy, no carotid bruit, no JVD, supple, symmetrical, trachea midline and thyroid not enlarged, symmetric, no tenderness/mass/nodules Lymph nodes: Cervical, supraclavicular, and axillary nodes normal. Resp: clear to auscultation bilaterally and normal percussion bilaterally Back: symmetric, no curvature. ROM normal. No CVA tenderness. Cardio: regular rate and rhythm, S1, S2 normal, no murmur, click, rub or gallop GI: soft, non-tender; bowel sounds normal; no masses,  no organomegaly Extremities: extremities normal, atraumatic, no cyanosis or  edema Neurologic: Grossly normal Left breast no masses nipple discharge no skin changes. Right mastectomy scar is healing well. There still is some scar tissue formation and some scabbing. But there is no signs of infection para  ECOG PERFORMANCE STATUS: 0 - Asymptomatic  Blood pressure 133/77, pulse 112, temperature 98.4 F (36.9 C), height 5' 3.5" (1.613 m), weight 224 lb 3.2 oz (101.696 kg).  LABORATORY DATA: Lab Results  Component Value Date   WBC 5.7 12/02/2011   HGB 10.2* 12/02/2011   HCT 30.9* 12/02/2011   MCV 98.7 12/02/2011   PLT 211 12/02/2011      Chemistry      Component Value Date/Time   NA 140 11/25/2011 0926   K 3.6 11/25/2011 0926   CL 104 11/25/2011 0926   CO2 23 11/25/2011 0926   BUN 15 11/25/2011 0926   CREATININE 0.64 11/25/2011 0926      Component Value Date/Time   CALCIUM 8.6 11/25/2011 0926   ALKPHOS 72 11/25/2011 0926   AST 33 11/25/2011 0926   ALT 29 11/25/2011 0926   BILITOT 0.6 11/25/2011 0926       RADIOGRAPHIC STUDIES:  No results found.  ASSESSMENT: 66 year old female with   #1 stage I HER-2/neu positive breast cancer. Her final stage was 1.9 cm high-grade poorly differentiated invasive ductal carcinoma no LVI ER positive PR positive HER-2/neu positive. She underwent a mastectomy. No sentinel nodes were involved with disease.   #2. Completed chemotherapy but continue Herceptin q 3 weeks.  #3. Anemia: we will proceed with a transfusion with 2 units PRBC to see if this will give her syptomatic relief.  #4. We discussed starting her on an AI risks and benefits were discussed with her today  PLAN:   #1 patient will proceed with her scheduled Herceptin today  #2. Blood transfusion on Saturday. All orders in the computer.  #3 Patient does not want to start on anti-estrogen therapy at this time. She would like to wait at least a few weeks so she can get stronger. I will discuss this again with her on her next visit with me.  #4 RTC in 3 weeks  time,  .  All questions were answered. The patient knows to call the clinic with any problems, questions or concerns. We can certainly see the patient much sooner if necessary.  I spent 30 minutes counseling the patient face to face. The total time spent in the appointment was 30 minutes.    Drue Second, MD Medical/Oncology [facility] [phone] (beeper) [phone] (Office)  12/02/2011, 12:27 PM

## 2011-12-02 NOTE — Progress Notes (Signed)
1130-Pt refuses Tylenol and Benadryl pre meds.  Type and crossmatch drawn via PAC for PRBC transfusion on 12/05/11 at 0800.

## 2011-12-02 NOTE — Patient Instructions (Signed)
1. We will schedule blood transfusion in the next few days.  2. Your herceptin is now every 3 weeks to complete out a year.  3. I will see you on 02/05/12 again  4. Your next herceptin is on 4/10 with NP visit.

## 2011-12-03 ENCOUNTER — Ambulatory Visit: Payer: No Typology Code available for payment source

## 2011-12-04 ENCOUNTER — Ambulatory Visit: Payer: No Typology Code available for payment source

## 2011-12-05 ENCOUNTER — Telehealth: Payer: Self-pay | Admitting: *Deleted

## 2011-12-05 NOTE — Telephone Encounter (Signed)
Patient was to come in at 0800 am for blood transfusion.  Called home number to in quire about today's appointment.  No answer but message left.  Requested she call to reschedule due to time.

## 2011-12-05 NOTE — Telephone Encounter (Signed)
Called patient's daughter and left message to contact mom and ensure she is okay.  Instructed to go to ER if not otherwise to call to r/s transfusion if needed.  Patient station checked and no record of patient in hospital at this time.  Also have checked [facility] lobby looking for patient.

## 2011-12-06 LAB — TYPE AND SCREEN

## 2011-12-07 ENCOUNTER — Other Ambulatory Visit: Payer: Self-pay | Admitting: *Deleted

## 2011-12-07 ENCOUNTER — Ambulatory Visit: Payer: Self-pay

## 2011-12-07 ENCOUNTER — Telehealth: Payer: Self-pay | Admitting: Oncology

## 2011-12-07 NOTE — Telephone Encounter (Signed)
lmonvm of phone number [phone] regarding the pt's r/s appt to 12/11/2011@9 :30am

## 2011-12-09 ENCOUNTER — Ambulatory Visit: Payer: Self-pay

## 2011-12-09 ENCOUNTER — Ambulatory Visit: Payer: BC Managed Care – PPO

## 2011-12-09 ENCOUNTER — Ambulatory Visit: Payer: Self-pay | Admitting: Family

## 2011-12-09 ENCOUNTER — Other Ambulatory Visit: Payer: No Typology Code available for payment source | Admitting: Lab

## 2011-12-09 ENCOUNTER — Telehealth: Payer: Self-pay | Admitting: *Deleted

## 2011-12-09 ENCOUNTER — Ambulatory Visit: Payer: No Typology Code available for payment source

## 2011-12-09 ENCOUNTER — Encounter: Payer: Self-pay | Admitting: *Deleted

## 2011-12-09 ENCOUNTER — Ambulatory Visit: Payer: No Typology Code available for payment source | Admitting: Oncology

## 2011-12-09 DIAGNOSIS — D649 Anemia, unspecified: Secondary | ICD-10-CM

## 2011-12-09 NOTE — Telephone Encounter (Signed)
Pt called to regarding lab appt for blood transfusion 12/11/11. After reviewing chart: Last MD note 03/20-pt to receive 2 units on 03/23   Appt Saturday 12/05/11 for blood.  Pt no show.  Appt for blood transfusion was rescheduled for 03/29.  No lab appt for new Type and hold/screen.  Sent onc tx sched for lab appt 12/10/11 1:00pm per pt request   CBC and Type and Hold ordered for 12/10/11  New Blood orders entered.

## 2011-12-09 NOTE — Telephone Encounter (Signed)
per orders from 12-09-2011 schedule patient for lab on 12-10-2011 at 1:00pm

## 2011-12-10 ENCOUNTER — Encounter: Payer: Self-pay | Admitting: *Deleted

## 2011-12-10 ENCOUNTER — Other Ambulatory Visit (HOSPITAL_BASED_OUTPATIENT_CLINIC_OR_DEPARTMENT_OTHER): Payer: BC Managed Care – PPO

## 2011-12-10 ENCOUNTER — Telehealth: Payer: Self-pay | Admitting: *Deleted

## 2011-12-10 DIAGNOSIS — D649 Anemia, unspecified: Secondary | ICD-10-CM

## 2011-12-10 DIAGNOSIS — C50919 Malignant neoplasm of unspecified site of unspecified female breast: Secondary | ICD-10-CM

## 2011-12-10 LAB — CBC WITH DIFFERENTIAL/PLATELET
Basophils Absolute: 0 10*3/uL (ref 0.0–0.1)
Eosinophils Absolute: 0 10*3/uL (ref 0.0–0.5)
HCT: 37.1 % (ref 34.8–46.6)
HGB: 12.3 g/dL (ref 11.6–15.9)
LYMPH%: 31.2 % (ref 14.0–49.7)
MCV: 99.7 fL (ref 79.5–101.0)
MONO#: 0.7 10*3/uL (ref 0.1–0.9)
MONO%: 9.5 % (ref 0.0–14.0)
NEUT#: 4.4 10*3/uL (ref 1.5–6.5)
NEUT%: 58.3 % (ref 38.4–76.8)
Platelets: 479 10*3/uL — ABNORMAL HIGH (ref 145–400)
RBC: 3.72 10*6/uL (ref 3.70–5.45)

## 2011-12-10 LAB — COMPREHENSIVE METABOLIC PANEL
Alkaline Phosphatase: 69 U/L (ref 39–117)
BUN: 20 mg/dL (ref 6–23)
CO2: 24 mEq/L (ref 19–32)
Creatinine, Ser: 0.64 mg/dL (ref 0.50–1.10)
Glucose, Bld: 126 mg/dL — ABNORMAL HIGH (ref 70–99)
Total Bilirubin: 0.6 mg/dL (ref 0.3–1.2)

## 2011-12-10 NOTE — Telephone Encounter (Signed)
Labs reviewed by MD-VO to cancel blood transfusion for 12/11/11. Hgb 12.3  Called pt at [phone] no answer. LMOVM .  Blood transfusion will be cancelled for 12/11/11.  Hgb 12.3. Requested pt to call back to confirm msg received.  Onc Tx schedule sent  [facility] in scheduling lmovm to cancel pt's appt for transfusion 12/11/11

## 2011-12-16 ENCOUNTER — Other Ambulatory Visit: Payer: No Typology Code available for payment source | Admitting: Lab

## 2011-12-16 ENCOUNTER — Ambulatory Visit: Payer: No Typology Code available for payment source

## 2011-12-16 ENCOUNTER — Ambulatory Visit: Payer: No Typology Code available for payment source | Admitting: Family

## 2011-12-16 ENCOUNTER — Ambulatory Visit: Payer: BC Managed Care – PPO

## 2011-12-17 ENCOUNTER — Ambulatory Visit: Payer: No Typology Code available for payment source

## 2011-12-23 ENCOUNTER — Encounter: Payer: Self-pay | Admitting: Family

## 2011-12-23 ENCOUNTER — Ambulatory Visit: Payer: No Typology Code available for payment source | Admitting: Oncology

## 2011-12-23 ENCOUNTER — Ambulatory Visit (HOSPITAL_BASED_OUTPATIENT_CLINIC_OR_DEPARTMENT_OTHER): Payer: BC Managed Care – PPO | Admitting: Family

## 2011-12-23 ENCOUNTER — Other Ambulatory Visit (HOSPITAL_BASED_OUTPATIENT_CLINIC_OR_DEPARTMENT_OTHER): Payer: BC Managed Care – PPO | Admitting: Lab

## 2011-12-23 ENCOUNTER — Ambulatory Visit (HOSPITAL_BASED_OUTPATIENT_CLINIC_OR_DEPARTMENT_OTHER): Payer: BC Managed Care – PPO

## 2011-12-23 ENCOUNTER — Ambulatory Visit: Payer: No Typology Code available for payment source

## 2011-12-23 ENCOUNTER — Other Ambulatory Visit: Payer: No Typology Code available for payment source | Admitting: Lab

## 2011-12-23 VITALS — BP 124/74 | HR 88 | Temp 98.6°F | Ht 63.5 in | Wt 218.4 lb

## 2011-12-23 DIAGNOSIS — Z5112 Encounter for antineoplastic immunotherapy: Secondary | ICD-10-CM | POA: Diagnosis not present

## 2011-12-23 DIAGNOSIS — C50919 Malignant neoplasm of unspecified site of unspecified female breast: Secondary | ICD-10-CM

## 2011-12-23 DIAGNOSIS — Z170 Estrogen receptor positive status [ER+]: Secondary | ICD-10-CM

## 2011-12-23 DIAGNOSIS — R5381 Other malaise: Secondary | ICD-10-CM | POA: Diagnosis not present

## 2011-12-23 DIAGNOSIS — R5383 Other fatigue: Secondary | ICD-10-CM

## 2011-12-23 LAB — CBC WITH DIFFERENTIAL/PLATELET
Basophils Absolute: 0 10*3/uL (ref 0.0–0.1)
Eosinophils Absolute: 0.2 10*3/uL (ref 0.0–0.5)
HGB: 12.3 g/dL (ref 11.6–15.9)
LYMPH%: 43.4 % (ref 14.0–49.7)
MCV: 97.4 fL (ref 79.5–101.0)
MONO#: 0.7 10*3/uL (ref 0.1–0.9)
MONO%: 10.8 % (ref 0.0–14.0)
NEUT#: 2.8 10*3/uL (ref 1.5–6.5)
Platelets: 189 10*3/uL (ref 145–400)
WBC: 6.6 10*3/uL (ref 3.9–10.3)
nRBC: 0 % (ref 0–0)

## 2011-12-23 MED ORDER — TRASTUZUMAB CHEMO INJECTION 440 MG
6.0000 mg/kg | Freq: Once | INTRAVENOUS | Status: AC
  Administered 2011-12-23: 609 mg via INTRAVENOUS
  Filled 2011-12-23: qty 29

## 2011-12-23 MED ORDER — ACETAMINOPHEN 325 MG PO TABS: 650.0000 mg | ORAL_TABLET | Freq: Once | ORAL | Status: DC

## 2011-12-23 MED ORDER — DIPHENHYDRAMINE HCL 25 MG PO CAPS: 50.0000 mg | ORAL_CAPSULE | Freq: Once | ORAL | Status: DC

## 2011-12-23 MED ORDER — HEPARIN SOD (PORK) LOCK FLUSH 100 UNIT/ML IV SOLN
500.0000 [IU] | Freq: Once | INTRAVENOUS | Status: AC | PRN
  Administered 2011-12-23: 500 [IU]
  Filled 2011-12-23: qty 5

## 2011-12-23 MED ORDER — SODIUM CHLORIDE 0.9 % IV SOLN
Freq: Once | INTRAVENOUS | Status: AC
  Administered 2011-12-23: 11:00:00 via INTRAVENOUS

## 2011-12-23 MED ORDER — SODIUM CHLORIDE 0.9 % IJ SOLN
10.0000 mL | INTRAMUSCULAR | Status: DC | PRN
  Administered 2011-12-23: 10 mL
  Filled 2011-12-23: qty 10

## 2011-12-23 NOTE — Progress Notes (Signed)
[facility]  Name: [name]                  DATE: 12/23/2011 MRN: [id number]                      DOB: 1946-08-20  CC: Janet Rowan, MD          REFERRING PHYSICIAN: [name], MD  DIAGNOSIS: Patient Active Problem List  Diagnoses Date Noted  . 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 Final  pathology revealed 1.9 cm, infiltrating ductal carcinoma with CISH ratio of 9.3 and pathologic staging of T1c N0, Stage IA.    PRIOR THERAPY:  1. Right simple mastectomy with right axillary sentinel node biopsy and left subclavian power port 06/26/2011. Final pathology revealed 1.9 cm invasive ductal carcinoma, ER/PR positive, HER-2/neu positive with a ratio of 9.73. Sentinel node biopsy negative. Pathologic stage TIcC. N0 M0, Stage IA right breast cancer.  2. Weekly Taxotere carboplatinum and Herceptin beginning November 2012. However, had considerable side effects requiring discontinuation/holding of the chemotherapy.   CURRENT THERAPY: Herceptin every 3 weeks, will continue for 1 year.   INTERIM HISTORY: Due for herceptin only today. Feels much improved after discontinuing chemotherapy. Dizziness and syncopal episodes have resolved. No nausea or vomiting. Appetite is fair, and bowel and bladder function normal. No infections. Recent echo March 5 showed EF 55-60%, unchanged since initiating Herceptin. No headache or blurred vision. No cough or shortness of breath. No abdominal pain or new bone pain. Remainder of the 10 point  review of systems is negative.  Dr. Welton Flakes had discussed antiestrogen therapy on her last visit. Pt is committed to taking antiestrogen therapy, but decision was made to wait until her visit with Dr. Welton Flakes 02/05/12 to initiate to give her time to recover from the side effects of chemo.   Dr. Jones Broom reinitiated HCTZ for mild fluid  overload, but continues to hold Lisinopril for now. Will monitor blood pressure closely.   PHYSICAL EXAM: BP 124/74  Pulse 88  Temp(Src) 98.6 F (37 C) (Oral)  Ht 5' 3.5" (1.613 m)  Wt 218 lb 6.4 oz (99.066 kg)  BMI 38.08 kg/m2 O2 sat 97-98%.  General: Well developed, well nourished, appears weak, no obvious distress. Alone at today's visit. Walks steadily without a cane or walker.  EENT: No ocular or oral lesions. No stomatitis.  Respiratory: Lungs are clear to auscultation bilaterally with normal respiratory movement and no accessory muscle use. Cardiac: No murmur, rub or tachycardia. No upper or lower extremity edema.  GI: Abdomen is soft, no palpable hepatosplenomegaly. No fluid wave. No tenderness. Musculoskeletal: No kyphosis, no tenderness over the spine, ribs or hips. Lymph: No cervical, infraclavicular, axillary or inguinal adenopathy. Neuro: No focal neurological deficits. Psych: Alert and oriented X 3, appropriate mood and affect.    SOCIAL HISTORY:  . 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 Smoking status: Former Games developer  . Alcohol Use: 0.6 oz/week    1 Glasses of wine per week   LABORATORY STUDIES:   Results for orders placed in visit on 12/23/11  CBC WITH DIFFERENTIAL      Component Value Range   WBC 6.6  3.9 - 10.3 (10e3/uL)   NEUT# 2.8  1.5 - 6.5 (10e3/uL)   HGB 12.3  11.6 -  15.9 (g/dL)   HCT 21.3  08.6 - 57.8 (%)   Platelets 189  145 - 400 (10e3/uL)   MCV 97.4  79.5 - 101.0 (fL)   MCH 32.3  25.1 - 34.0 (pg)   MCHC 33.2  31.5 - 36.0 (g/dL)   RBC 4.69  6.29 - 5.28 (10e6/uL)   RDW 14.9 (*) 11.2 - 14.5 (%)   lymph# 2.9  0.9 - 3.3 (10e3/uL)   MONO# 0.7  0.1 - 0.9 (10e3/uL)   Eosinophils Absolute 0.2  0.0 - 0.5 (10e3/uL)   Basophils Absolute 0.0  0.0 - 0.1 (10e3/uL)   NEUT% 42.9  38.4 - 76.8 (%)   LYMPH% 43.4  14.0 - 49.7 (%)   MONO% 10.8  0.0 - 14.0 (%)   EOS% 2.3  0.0 - 7.0 (%)    BASO% 0.6  0.0 - 2.0 (%)   nRBC 0  0 - 0 (%)    IMPRESSION:  66 year old white female with: 1. Stage I right breast cancer, ER/PR positive, HER-2/neu positive, on Herceptin only with good tolerance.  2. Dizziness and syncope improved after discontinuing b/p meds. HCTZ reinitiated, blood pressure in normal range today.  3. Weakness and fatigue improving daily since stopping chemo.   PLAN:   1. Treatment today, Herceptin only. Will continue Herceptin only to complete one year.  2. Return 5/1 for appt with me prior to next Herceptin alone. Will see Dr. Welton Flakes 5/24. Lab orders entered for that visit. 3. Hold Lisinopril until further notice. Will monitor blood pressure since discontinuing chemo. 4. Follow-up with Dr. Jones Broom to monitor cardiac function while on Herceptin.

## 2011-12-23 NOTE — Patient Instructions (Signed)
[facility] Discharge Instructions for Patients Receiving Chemotherapy  Today you received the following antibody agents: Herceptin  If you develop nausea and vomiting that is not controlled by your nausea medication, call the clinic. If it is after clinic hours your family physician or the after hours number for the clinic or go to the Emergency Department.   BELOW ARE SYMPTOMS THAT SHOULD BE REPORTED IMMEDIATELY:  *FEVER GREATER THAN 100.5 F  *CHILLS WITH OR WITHOUT FEVER  NAUSEA AND VOMITING THAT IS NOT CONTROLLED WITH YOUR NAUSEA MEDICATION  *UNUSUAL SHORTNESS OF BREATH  *UNUSUAL BRUISING OR BLEEDING  TENDERNESS IN MOUTH AND THROAT WITH OR WITHOUT PRESENCE OF ULCERS  *URINARY PROBLEMS  *BOWEL PROBLEMS  UNUSUAL RASH Items with * indicate a potential emergency and should be followed up as soon as possible.  The clinic phone number is [phone].   I have been informed and understand all the instructions given to me. I know to contact the clinic, my physician, or go to the Emergency Department if any problems should occur. I do not have any questions at this time, but understand that I may call the clinic during office hours   should I have any questions or need assistance in obtaining follow up care.    __________________________________________  _____________  __________ Signature of Patient or Authorized Representative            Date                   Time    __________________________________________ Nurse's Signature

## 2011-12-24 LAB — COMPREHENSIVE METABOLIC PANEL
ALT: 22 U/L (ref 0–35)
CO2: 24 mEq/L (ref 19–32)
Calcium: 10 mg/dL (ref 8.4–10.5)
Chloride: 101 mEq/L (ref 96–112)
Glucose, Bld: 91 mg/dL (ref 70–99)
Sodium: 138 mEq/L (ref 135–145)
Total Bilirubin: 0.7 mg/dL (ref 0.3–1.2)
Total Protein: 5.8 g/dL — ABNORMAL LOW (ref 6.0–8.3)

## 2011-12-30 ENCOUNTER — Other Ambulatory Visit: Payer: No Typology Code available for payment source | Admitting: Lab

## 2011-12-30 ENCOUNTER — Ambulatory Visit: Payer: BC Managed Care – PPO

## 2011-12-30 ENCOUNTER — Ambulatory Visit: Payer: No Typology Code available for payment source | Admitting: Oncology

## 2012-01-06 ENCOUNTER — Other Ambulatory Visit: Payer: No Typology Code available for payment source | Admitting: Lab

## 2012-01-06 ENCOUNTER — Ambulatory Visit: Payer: No Typology Code available for payment source | Admitting: Family

## 2012-01-06 ENCOUNTER — Ambulatory Visit: Payer: BC Managed Care – PPO

## 2012-01-13 ENCOUNTER — Encounter: Payer: Self-pay | Admitting: Family

## 2012-01-13 ENCOUNTER — Other Ambulatory Visit (HOSPITAL_BASED_OUTPATIENT_CLINIC_OR_DEPARTMENT_OTHER): Payer: BC Managed Care – PPO | Admitting: Lab

## 2012-01-13 ENCOUNTER — Other Ambulatory Visit: Payer: No Typology Code available for payment source | Admitting: Lab

## 2012-01-13 ENCOUNTER — Telehealth: Payer: Self-pay | Admitting: *Deleted

## 2012-01-13 ENCOUNTER — Ambulatory Visit (HOSPITAL_BASED_OUTPATIENT_CLINIC_OR_DEPARTMENT_OTHER): Payer: BC Managed Care – PPO | Admitting: Family

## 2012-01-13 ENCOUNTER — Ambulatory Visit (HOSPITAL_BASED_OUTPATIENT_CLINIC_OR_DEPARTMENT_OTHER): Payer: BC Managed Care – PPO

## 2012-01-13 VITALS — BP 114/76 | HR 78 | Temp 99.0°F | Ht 63.5 in | Wt 216.1 lb

## 2012-01-13 DIAGNOSIS — Z5112 Encounter for antineoplastic immunotherapy: Secondary | ICD-10-CM

## 2012-01-13 DIAGNOSIS — C50919 Malignant neoplasm of unspecified site of unspecified female breast: Secondary | ICD-10-CM

## 2012-01-13 DIAGNOSIS — Z09 Encounter for follow-up examination after completed treatment for conditions other than malignant neoplasm: Secondary | ICD-10-CM

## 2012-01-13 DIAGNOSIS — M159 Polyosteoarthritis, unspecified: Secondary | ICD-10-CM

## 2012-01-13 DIAGNOSIS — Z170 Estrogen receptor positive status [ER+]: Secondary | ICD-10-CM | POA: Diagnosis not present

## 2012-01-13 LAB — CBC WITH DIFFERENTIAL/PLATELET
BASO%: 0.6 % (ref 0.0–2.0)
EOS%: 4 % (ref 0.0–7.0)
HCT: 39.2 % (ref 34.8–46.6)
LYMPH%: 43.2 % (ref 14.0–49.7)
MCH: 31.1 pg (ref 25.1–34.0)
MCHC: 33.7 g/dL (ref 31.5–36.0)
MONO#: 0.5 10*3/uL (ref 0.1–0.9)
NEUT%: 44.3 % (ref 38.4–76.8)
Platelets: 213 10*3/uL (ref 145–400)
RBC: 4.24 10*6/uL (ref 3.70–5.45)
WBC: 6.8 10*3/uL (ref 3.9–10.3)
lymph#: 3 10*3/uL (ref 0.9–3.3)

## 2012-01-13 LAB — BASIC METABOLIC PANEL
Calcium: 8.9 mg/dL (ref 8.4–10.5)
Creatinine, Ser: 0.66 mg/dL (ref 0.50–1.10)
Sodium: 138 mEq/L (ref 135–145)

## 2012-01-13 MED ORDER — ACETAMINOPHEN 325 MG PO TABS
650.0000 mg | ORAL_TABLET | Freq: Once | ORAL | Status: AC
  Administered 2012-01-13: 650 mg via ORAL

## 2012-01-13 MED ORDER — DIPHENHYDRAMINE HCL 25 MG PO CAPS
50.0000 mg | ORAL_CAPSULE | Freq: Once | ORAL | Status: AC
  Administered 2012-01-13: 50 mg via ORAL

## 2012-01-13 MED ORDER — SODIUM CHLORIDE 0.9 % IJ SOLN
10.0000 mL | INTRAMUSCULAR | Status: DC | PRN
  Administered 2012-01-13: 10 mL
  Filled 2012-01-13: qty 10

## 2012-01-13 MED ORDER — TRASTUZUMAB CHEMO INJECTION 440 MG
6.0000 mg/kg | Freq: Once | INTRAVENOUS | Status: AC
  Administered 2012-01-13: 609 mg via INTRAVENOUS
  Filled 2012-01-13: qty 29

## 2012-01-13 MED ORDER — SODIUM CHLORIDE 0.9 % IV SOLN
Freq: Once | INTRAVENOUS | Status: AC
  Administered 2012-01-13: 11:00:00 via INTRAVENOUS

## 2012-01-13 MED ORDER — HEPARIN SOD (PORK) LOCK FLUSH 100 UNIT/ML IV SOLN
500.0000 [IU] | Freq: Once | INTRAVENOUS | Status: AC | PRN
  Administered 2012-01-13: 500 [IU]
  Filled 2012-01-13: qty 5

## 2012-01-13 MED ORDER — TRAMADOL HCL 50 MG PO TABS: 50.0000 mg | ORAL_TABLET | ORAL | Status: DC | PRN

## 2012-01-13 NOTE — Progress Notes (Signed)
[facility]  Name: [name]                  DATE: 01/13/2012 MRN: [id number]                      DOB: 1945/12/19  CC: [name], MD          REFERRING PHYSICIAN: [name], MD  DIAGNOSIS: Patient Active Problem List  Diagnoses Date Noted  . 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 Final  pathology revealed 1.9 cm, infiltrating ductal carcinoma with CISH ratio of 9.3 and pathologic staging of T1c N0, Stage IA.    PRIOR THERAPY:  1. Right simple mastectomy with right axillary sentinel node biopsy and left subclavian power port 06/26/2011. Final pathology revealed 1.9 cm invasive ductal carcinoma, ER/PR positive, HER-2/neu positive with a ratio of 9.73. Sentinel node biopsy negative. Pathologic stage TIcC. N0 M0, Stage IA right breast cancer.  2. Weekly Taxotere carboplatinum and Herceptin beginning November 2012. However, had considerable side effects requiring discontinuation/holding of the chemotherapy.   CURRENT THERAPY: Herceptin every 3 weeks, will continue for 1 year.   INTERIM HISTORY: Due for herceptin only today. Feels much improved after discontinuing chemotherapy. Dizziness and syncopal episodes have resolved. Has resumed Lisinopril, blood pressures have been stable. No nausea or vomiting. Appetite is fair, and bowel and bladder function normal. No infections. Recent echo March 5 showed EF 55-60%, unchanged since initiating Herceptin. No headache or blurred vision. No cough or shortness of breath. No abdominal pain or new bone pain. Chronic osteoarthritis, worse after chemo. Cannot take NSAIDS after lap band surgery.  Remainder of the 10 point  review of systems is negative.  Dr. Welton Flakes had discussed antiestrogen therapy on her last visit. Pt is committed to taking antiestrogen therapy, but decision was made to wait until her visit  with Dr. Welton Flakes 02/05/12 to initiate to give her time to recover from the side effects of chemo.   Is preparing for reconstruction surgery, scheduled for July to allow for 3 weeks off to recover. Will have right reconstruction with expander and left mastopexy.   PHYSICAL EXAM: BP 114/76  Pulse 78  Temp(Src) 99 F (37.2 C) (Oral)  Ht 5' 3.5" (1.613 m)  Wt 216 lb 1.6 oz (98.022 kg)  BMI 37.68 kg/m2 General: Well developed, well nourished, appears stroner, no obvious distress. Alone at today's visit. Walks steadily without a cane or walker.  EENT: No ocular or oral lesions. No stomatitis.  Respiratory: Lungs are clear to auscultation bilaterally with normal respiratory movement and no accessory muscle use. Cardiac: No murmur, rub or tachycardia. No upper or lower extremity edema.  GI: Abdomen is soft, no palpable hepatosplenomegaly. No fluid wave. No tenderness. Musculoskeletal: No kyphosis, no tenderness over the spine, ribs or hips. Lymph: No cervical, infraclavicular, axillary or inguinal adenopathy. Neuro: No focal neurological deficits. Psych: Alert and oriented X 3, appropriate mood and affect.    SOCIAL HISTORY:  . 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 Smoking status: Former Games developer  . Alcohol Use: 0.6 oz/week    1 Glasses of wine per week   LABORATORY STUDIES:   Results for orders placed in visit on 01/13/12  CBC WITH DIFFERENTIAL      Component Value Range  WBC 6.8  3.9 - 10.3 (10e3/uL)   NEUT# 3.0  1.5 - 6.5 (10e3/uL)   HGB 13.2  11.6 - 15.9 (g/dL)   HCT 82.9  56.2 - 13.0 (%)   Platelets 213  145 - 400 (10e3/uL)   MCV 92.5  79.5 - 101.0 (fL)   MCH 31.1  25.1 - 34.0 (pg)   MCHC 33.7  31.5 - 36.0 (g/dL)   RBC 8.65  7.84 - 6.96 (10e6/uL)   RDW 13.3  11.2 - 14.5 (%)   lymph# 3.0  0.9 - 3.3 (10e3/uL)   MONO# 0.5  0.1 - 0.9 (10e3/uL)   Eosinophils Absolute 0.3  0.0 - 0.5 (10e3/uL)   Basophils  Absolute 0.0  0.0 - 0.1 (10e3/uL)   NEUT% 44.3  38.4 - 76.8 (%)   LYMPH% 43.2  14.0 - 49.7 (%)   MONO% 7.9  0.0 - 14.0 (%)   EOS% 4.0  0.0 - 7.0 (%)   BASO% 0.6  0.0 - 2.0 (%)    IMPRESSION:  66 year old white female with: 1. Stage I right breast cancer, ER/PR positive, HER-2/neu positive, on Herceptin only with good tolerance.  2. Dizziness and syncope improved after discontinuing b/p meds. HCTZ and Lisinopril reinitiated.   3. Weakness and fatigue improving daily since stopping chemo.  4. Osteoarthritis pain, exacerbated with chemo, persistent.   PLAN:   1. Treatment today, Herceptin only. Will continue Herceptin only to complete one year.  2. Return to see Dr. Welton Flakes 5/24. Lab orders entered for that visit. 3. Follow-up with Dr. Jones Broom to monitor cardiac function while on Herceptin. She will be due Echo early June, she prefers to call herself for appt.  4. Refill Tramadol prescription (e-scribed).

## 2012-01-13 NOTE — Telephone Encounter (Signed)
Per staff message from Crystal, I have scheduled treatment appts for the patient. Appts in computer and Crystal aware.  JMW  

## 2012-01-15 ENCOUNTER — Telehealth: Payer: Self-pay | Admitting: Oncology

## 2012-01-15 NOTE — Telephone Encounter (Signed)
Pt is aware to pick up her may-July appt calendar

## 2012-01-20 ENCOUNTER — Other Ambulatory Visit: Payer: No Typology Code available for payment source | Admitting: Lab

## 2012-01-20 ENCOUNTER — Ambulatory Visit: Payer: BC Managed Care – PPO

## 2012-01-20 ENCOUNTER — Ambulatory Visit: Payer: BC Managed Care – PPO | Admitting: Oncology

## 2012-01-20 ENCOUNTER — Other Ambulatory Visit: Payer: BC Managed Care – PPO | Admitting: Lab

## 2012-01-26 ENCOUNTER — Encounter: Payer: Self-pay | Admitting: *Deleted

## 2012-01-27 ENCOUNTER — Ambulatory Visit: Payer: BC Managed Care – PPO | Admitting: Family

## 2012-01-27 ENCOUNTER — Other Ambulatory Visit: Payer: No Typology Code available for payment source | Admitting: Lab

## 2012-01-27 ENCOUNTER — Other Ambulatory Visit: Payer: BC Managed Care – PPO | Admitting: Lab

## 2012-01-27 ENCOUNTER — Ambulatory Visit: Payer: BC Managed Care – PPO

## 2012-02-03 ENCOUNTER — Other Ambulatory Visit: Payer: BC Managed Care – PPO | Admitting: Lab

## 2012-02-03 ENCOUNTER — Other Ambulatory Visit: Payer: No Typology Code available for payment source | Admitting: Lab

## 2012-02-03 ENCOUNTER — Ambulatory Visit: Payer: BC Managed Care – PPO

## 2012-02-03 ENCOUNTER — Ambulatory Visit: Payer: BC Managed Care – PPO | Admitting: Family

## 2012-02-05 ENCOUNTER — Encounter: Payer: Self-pay | Admitting: Oncology

## 2012-02-05 ENCOUNTER — Telehealth: Payer: Self-pay | Admitting: *Deleted

## 2012-02-05 ENCOUNTER — Ambulatory Visit (HOSPITAL_BASED_OUTPATIENT_CLINIC_OR_DEPARTMENT_OTHER): Payer: BC Managed Care – PPO

## 2012-02-05 ENCOUNTER — Ambulatory Visit (HOSPITAL_BASED_OUTPATIENT_CLINIC_OR_DEPARTMENT_OTHER): Payer: BC Managed Care – PPO | Admitting: Oncology

## 2012-02-05 ENCOUNTER — Other Ambulatory Visit (HOSPITAL_BASED_OUTPATIENT_CLINIC_OR_DEPARTMENT_OTHER): Payer: BC Managed Care – PPO | Admitting: Lab

## 2012-02-05 VITALS — BP 119/72 | HR 83 | Temp 97.4°F | Ht 63.5 in | Wt 220.0 lb

## 2012-02-05 DIAGNOSIS — Z5112 Encounter for antineoplastic immunotherapy: Secondary | ICD-10-CM | POA: Diagnosis not present

## 2012-02-05 DIAGNOSIS — Z09 Encounter for follow-up examination after completed treatment for conditions other than malignant neoplasm: Secondary | ICD-10-CM

## 2012-02-05 DIAGNOSIS — C50919 Malignant neoplasm of unspecified site of unspecified female breast: Secondary | ICD-10-CM | POA: Diagnosis not present

## 2012-02-05 LAB — CBC WITH DIFFERENTIAL/PLATELET
BASO%: 0.5 % (ref 0.0–2.0)
Basophils Absolute: 0 10*3/uL (ref 0.0–0.1)
EOS%: 3.9 % (ref 0.0–7.0)
Eosinophils Absolute: 0.3 10*3/uL (ref 0.0–0.5)
HGB: 13.3 g/dL (ref 11.6–15.9)
LYMPH%: 34.7 % (ref 14.0–49.7)
MCV: 90.1 fL (ref 79.5–101.0)
MONO#: 0.7 10*3/uL (ref 0.1–0.9)
NEUT#: 4.5 10*3/uL (ref 1.5–6.5)
RDW: 13.1 % (ref 11.2–14.5)
WBC: 8.5 10*3/uL (ref 3.9–10.3)

## 2012-02-05 LAB — BASIC METABOLIC PANEL
Calcium: 9.4 mg/dL (ref 8.4–10.5)
Creatinine, Ser: 0.7 mg/dL (ref 0.50–1.10)
Sodium: 137 mEq/L (ref 135–145)

## 2012-02-05 MED ORDER — HEPARIN SOD (PORK) LOCK FLUSH 100 UNIT/ML IV SOLN
500.0000 [IU] | Freq: Once | INTRAVENOUS | Status: AC | PRN
  Administered 2012-02-05: 500 [IU]
  Filled 2012-02-05: qty 5

## 2012-02-05 MED ORDER — SODIUM CHLORIDE 0.9 % IV SOLN
6.0000 mg/kg | Freq: Once | INTRAVENOUS | Status: AC
  Administered 2012-02-05: 609 mg via INTRAVENOUS
  Filled 2012-02-05: qty 29

## 2012-02-05 MED ORDER — SODIUM CHLORIDE 0.9 % IV SOLN
Freq: Once | INTRAVENOUS | Status: AC
  Administered 2012-02-05: 14:00:00 via INTRAVENOUS

## 2012-02-05 MED ORDER — ACETAMINOPHEN 325 MG PO TABS
650.0000 mg | ORAL_TABLET | Freq: Once | ORAL | Status: AC
  Administered 2012-02-05: 650 mg via ORAL

## 2012-02-05 MED ORDER — EXEMESTANE 25 MG PO TABS: 25.0000 mg | ORAL_TABLET | Freq: Every day | ORAL | Status: AC

## 2012-02-05 MED ORDER — SODIUM CHLORIDE 0.9 % IJ SOLN
10.0000 mL | INTRAMUSCULAR | Status: DC | PRN
  Administered 2012-02-05: 10 mL
  Filled 2012-02-05: qty 10

## 2012-02-05 NOTE — Patient Instructions (Signed)
1. Proceed with Herceptin today.  2. Begin aromasin 25 mg daily, prescription was sent to the pharmacy.  3. We will see you back in 3 weeks for next herceptin

## 2012-02-05 NOTE — Progress Notes (Signed)
OFFICE PROGRESS NOTE  CC [name] M.D. [name] M.D.  [facility], MD, MD [address] [place] [address]  DIAGNOSIS: 66 year old female with multicentric right breast cancer that was ER positive PR positive HER-2/neu positive clinical stage I.  PRIOR THERAPY:  #1 status post right simple mastectomy with right axillary sentinel node biopsy and left subclavian power port insertion on 06/26/2011.  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 Sentinel node biopsy done at the time of her mastectomy was negative for metastatic disease. Pathologic stage TI C. N0 M0, stage I breast cancer.  #3 patient has been receiving weekly Taxotere carboplatinum and Herceptin beginning November 2012. However she has had considerable side effects requiring discontinuation/holding of the chemotherapy.  #4 S/P Taxotere/Crboplatin q 3 weeks with weekly Herceptin beginning 09/30/11 x 3 cycles completed on 11/11/11  #5. Herceptin q 21 days begin 12/02/11  #6 adjuvant Aromasin 25 mg daily starting 02/05/2012.  CURRENT THERAPY: Herceptin q 21 days starting 12/02/11 and aromasin 25 mg daily beginning 02/05/12  INTERVAL HISTORY: [name] 66 y.o. female followup visit today for her scheduled chemotherapy. She is extremely tired likely all related to her taxotere therapy. We have discontinued her therapy with carb/taxotere. She completed almosst a total of 4 cycles of TCH combination therapy. She is having shortness of breath, she is seen by Dr. Gala Romney and had recently had an echo that showed a normal EF. She however did have some fluid and she was begun on diuretics. She has no bleeding. Her hemoglobin is 10.2 but she is symptomatic from this. Remainder of the 10 point point review of systems is negative.[employer]  MEDICAL HISTORY: Past Medical History  Diagnosis Date  . Allergy   . Asthma     . Hypertension   . Hyperlipidemia   . Depression   . Fibromyalgia   . Arthritis     feet, knee and base of thumbs  . Osteopenia   . Carpal tunnel syndrome     right  . Breast cancer, stage 1 07/18/2011  . Dehydration 10/21/2011    ALLERGIES:   has no known allergies.  MEDICATIONS:  Current Outpatient Prescriptions  Medication Sig Dispense Refill  . alendronate (FOSAMAX) 70 MG tablet Take 70 mg by mouth every 7 (seven) days. Take with a full glass of water on an empty stomach.      Marland Kitchen atorvastatin (LIPITOR) 20 MG tablet Take 20 mg by mouth daily.        . calcium gluconate 500 MG tablet Take 500 mg by mouth daily.        . cholecalciferol (VITAMIN D) 1000 UNITS tablet Take 2,000 Units by mouth daily.        . DULoxetine (CYMBALTA) 60 MG capsule Take 120 mg by mouth daily.       [employer]land Kitchen gabapentin (NEURONTIN) 600 MG tablet Take 600 mg by mouth daily.       . hydrochlorothiazide (HYDRODIURIL) 25 MG tablet Take 0.5 tablets (12.5 mg total) by mouth daily.  30 tablet  6  . Krill Oil 300 MG CAPS Take 1 capsule by mouth daily.        [employer]land Kitchen lidocaine-prilocaine (EMLA) cream Apply 1 application topically as needed.        . loratadine (CLARITIN) 10 MG tablet Take 10 mg by mouth daily.        . magnesium 30 MG tablet Take  500 mg by mouth daily.       . Multiple Vitamin (MULTIVITAMIN) capsule Take 1 capsule by mouth daily.        . ondansetron (ZOFRAN) 8 MG tablet Take 8 mg by mouth as directed.        Marland Kitchen Prochlorperazine Maleate (COMPAZINE PO) Take 10 mg by mouth.        . traMADol (ULTRAM) 50 MG tablet Take 1 tablet (50 mg total) by mouth as needed for pain (PRN for osteoarthritis and fibromyalgia pain. ).  90 tablet  0  . DISCONTD: diphenhydrAMINE (SOMINEX) 25 MG tablet Take 25 mg by mouth at bedtime as needed.        Marland Kitchen DISCONTD: lisinopril (PRINIVIL,ZESTRIL) 10 MG tablet Take 10 mg by mouth daily.        SURGICAL HISTORY:  Past Surgical History  Procedure Date  . Tonsillectomy 1950  . Tubal  ligation 1978  . Abdominal hysterectomy 1981  . Right ear surgery 1983    tumor removed (?)  . Right breast lumpectomy 1986  . Ethmoidectomy 1991  . Knee arthroscopy 1996, 2001    left  . Facial plastic surgery 1996  . Bunionectomy 1998    left foot  . Total knee arthroplasty 2006    left  . Nasal sinus surgery 2001  . Lap band surgery 2010  . Right mastectomy 06-26-11  . Port-a-cath placement 06/26/11    tip in lower SVC per chest x-ray read by Dr. Dwyane Dee    REVIEW OF SYSTEMS:  A comprehensive review of systems was negative.   PHYSICAL EXAMINATION: General appearance: alert, cooperative and no distress Neck: no adenopathy, no carotid bruit, no JVD, supple, symmetrical, trachea midline and thyroid not enlarged, symmetric, no tenderness/mass/nodules Lymph nodes: Cervical, supraclavicular, and axillary nodes normal. Resp: clear to auscultation bilaterally and normal percussion bilaterally Back: symmetric, no curvature. ROM normal. No CVA tenderness. Cardio: regular rate and rhythm, S1, S2 normal, no murmur, click, rub or gallop GI: soft, non-tender; bowel sounds normal; no masses,  no organomegaly Extremities: extremities normal, atraumatic, no cyanosis or edema Neurologic: Grossly normal Left breast no masses nipple discharge no skin changes. Right mastectomy scar is healing well. There still is some scar tissue formation and some scabbing. But there is no signs of infection para  ECOG PERFORMANCE STATUS: 0 - Asymptomatic  Blood pressure 119/72, pulse 83, temperature 97.4 F (36.3 C), height 5' 3.5" (1.613 m), weight 220 lb (99.791 kg).  LABORATORY DATA: Lab Results  Component Value Date   WBC 8.5 02/05/2012   HGB 13.3 02/05/2012   HCT 39.3 02/05/2012   MCV 90.1 02/05/2012   PLT 216 02/05/2012      Chemistry      Component Value Date/Time   NA 138 01/13/2012 0933   K 3.5 01/13/2012 0933   CL 104 01/13/2012 0933   CO2 27 01/13/2012 0933   BUN 21 01/13/2012 0933   CREATININE  0.66 01/13/2012 0933      Component Value Date/Time   CALCIUM 8.9 01/13/2012 0933   ALKPHOS 76 12/23/2011 0935   AST 31 12/23/2011 0935   ALT 22 12/23/2011 0935   BILITOT 0.7 12/23/2011 0935       RADIOGRAPHIC STUDIES:  No results found.  ASSESSMENT: 66 year old female with   #1 stage I HER-2/neu positive breast cancer. Her final stage was 1.9 cm high-grade poorly differentiated invasive ductal carcinoma no LVI ER positive PR positive HER-2/neu positive. She underwent a mastectomy. No sentinel  nodes were involved with disease.   #2. Completed chemotherapy but continue Herceptin q 3 weeks to complete out one year of therapy.  #3 patient will begin adjuvant endocrine therapy consisting of an aromatase inhibitor.we have discussed risks and benefits of aromatase inhibitor. I will plan on giving her Aromasin 25 mg daily.    PLAN:  #1 patient will proceed with Herceptin today. She will also continue this every 3 weeks.  #2 a prescription for Herceptin was sent to the patient's pharmacy of choice.  #3 she will be seen back in 3 weeks' time for followup. .  All questions were answered. The patient knows to call the clinic with any problems, questions or concerns. We can certainly see the patient much sooner if necessary.  I spent 30 minutes counseling the patient face to face. The total time spent in the appointment was 30 minutes.    Drue Second, MD Medical/Oncology [facility] [phone] (beeper) [phone] (Office)  02/05/2012, 12:55 PM

## 2012-02-05 NOTE — Patient Instructions (Signed)
[facility] Discharge Instructions for Patients Receiving Chemotherapy  Today you received the following chemotherapy agents Herceptin.  To help prevent nausea and vomiting after your treatment, we encourage you to take your nausea medication as per scribed by your doctor.   If you develop nausea and vomiting that is not controlled by your nausea medication, call the clinic. If it is after clinic hours your family physician or the after hours number for the clinic or go to the Emergency Department.   BELOW ARE SYMPTOMS THAT SHOULD BE REPORTED IMMEDIATELY:  *FEVER GREATER THAN 100.5 F  *CHILLS WITH OR WITHOUT FEVER  NAUSEA AND VOMITING THAT IS NOT CONTROLLED WITH YOUR NAUSEA MEDICATION  *UNUSUAL SHORTNESS OF BREATH  *UNUSUAL BRUISING OR BLEEDING  TENDERNESS IN MOUTH AND THROAT WITH OR WITHOUT PRESENCE OF ULCERS  *URINARY PROBLEMS  *BOWEL PROBLEMS  UNUSUAL RASH Items with * indicate a potential emergency and should be followed up as soon as possible.  One of the nurses will contact you 24 hours after your treatment. Please let the nurse know about any problems that you may have experienced. Feel free to call the clinic you have any questions or concerns. The clinic phone number is [phone].   I have been informed and understand all the instructions given to me. I know to contact the clinic, my physician, or go to the Emergency Department if any problems should occur. I do not have any questions at this time, but understand that I may call the clinic during office hours   should I have any questions or need assistance in obtaining follow up care.    __________________________________________  _____________  __________ Signature of Patient or Authorized Representative            Date                   Time    __________________________________________ Nurse's Signature

## 2012-02-05 NOTE — Telephone Encounter (Signed)
printed out calendar and gave to the patient

## 2012-02-10 ENCOUNTER — Ambulatory Visit: Payer: BC Managed Care – PPO | Admitting: Family

## 2012-02-10 ENCOUNTER — Other Ambulatory Visit: Payer: BC Managed Care – PPO | Admitting: Lab

## 2012-02-10 ENCOUNTER — Ambulatory Visit: Payer: BC Managed Care – PPO

## 2012-02-17 ENCOUNTER — Ambulatory Visit: Payer: BC Managed Care – PPO

## 2012-02-23 ENCOUNTER — Ambulatory Visit: Payer: BC Managed Care – PPO

## 2012-02-26 ENCOUNTER — Other Ambulatory Visit (HOSPITAL_BASED_OUTPATIENT_CLINIC_OR_DEPARTMENT_OTHER): Payer: BC Managed Care – PPO

## 2012-02-26 ENCOUNTER — Ambulatory Visit (HOSPITAL_BASED_OUTPATIENT_CLINIC_OR_DEPARTMENT_OTHER): Payer: BC Managed Care – PPO | Admitting: Family

## 2012-02-26 ENCOUNTER — Encounter: Payer: Self-pay | Admitting: Family

## 2012-02-26 ENCOUNTER — Telehealth: Payer: Self-pay | Admitting: *Deleted

## 2012-02-26 ENCOUNTER — Ambulatory Visit (HOSPITAL_BASED_OUTPATIENT_CLINIC_OR_DEPARTMENT_OTHER): Payer: BC Managed Care – PPO

## 2012-02-26 VITALS — BP 107/72 | HR 80 | Temp 98.4°F | Ht 63.5 in | Wt 222.1 lb

## 2012-02-26 DIAGNOSIS — Z170 Estrogen receptor positive status [ER+]: Secondary | ICD-10-CM

## 2012-02-26 DIAGNOSIS — Z09 Encounter for follow-up examination after completed treatment for conditions other than malignant neoplasm: Secondary | ICD-10-CM

## 2012-02-26 DIAGNOSIS — C50919 Malignant neoplasm of unspecified site of unspecified female breast: Secondary | ICD-10-CM | POA: Diagnosis not present

## 2012-02-26 DIAGNOSIS — Z5112 Encounter for antineoplastic immunotherapy: Secondary | ICD-10-CM

## 2012-02-26 LAB — CBC WITH DIFFERENTIAL/PLATELET
BASO%: 0.7 % (ref 0.0–2.0)
EOS%: 2.8 % (ref 0.0–7.0)
LYMPH%: 47 % (ref 14.0–49.7)
MCH: 30 pg (ref 25.1–34.0)
MCHC: 34.3 g/dL (ref 31.5–36.0)
MONO#: 0.6 10*3/uL (ref 0.1–0.9)
Platelets: 196 10*3/uL (ref 145–400)
RBC: 4.46 10*6/uL (ref 3.70–5.45)
WBC: 5.7 10*3/uL (ref 3.9–10.3)
lymph#: 2.7 10*3/uL (ref 0.9–3.3)

## 2012-02-26 LAB — BASIC METABOLIC PANEL
CO2: 27 mEq/L (ref 19–32)
Calcium: 9.8 mg/dL (ref 8.4–10.5)
Sodium: 137 mEq/L (ref 135–145)

## 2012-02-26 MED ORDER — SODIUM CHLORIDE 0.9 % IJ SOLN
10.0000 mL | INTRAMUSCULAR | Status: DC | PRN
  Administered 2012-02-26: 10 mL
  Filled 2012-02-26: qty 10

## 2012-02-26 MED ORDER — SODIUM CHLORIDE 0.9 % IV SOLN
Freq: Once | INTRAVENOUS | Status: AC
  Administered 2012-02-26: 11:00:00 via INTRAVENOUS

## 2012-02-26 MED ORDER — ACETAMINOPHEN 325 MG PO TABS
650.0000 mg | ORAL_TABLET | Freq: Once | ORAL | Status: AC
  Administered 2012-02-26: 650 mg via ORAL

## 2012-02-26 MED ORDER — TRASTUZUMAB CHEMO INJECTION 440 MG
6.0000 mg/kg | Freq: Once | INTRAVENOUS | Status: AC
  Administered 2012-02-26: 609 mg via INTRAVENOUS
  Filled 2012-02-26: qty 29

## 2012-02-26 MED ORDER — HEPARIN SOD (PORK) LOCK FLUSH 100 UNIT/ML IV SOLN
500.0000 [IU] | Freq: Once | INTRAVENOUS | Status: AC | PRN
  Administered 2012-02-26: 500 [IU]
  Filled 2012-02-26: qty 5

## 2012-02-26 NOTE — Telephone Encounter (Signed)
patient came in on 02-26-2012 requesting to move her appointments that was all ready set up patient request was to move the 03-16-2012 to the same day orginially treatment was for a different day patient requested to move 04-08-2012 to 04-15-2012 patient was orginially all ready set up for 04-08-2012 patient came in on 02-26-2012 requested to move the 04-08-2012 to the 04-15-2012 called patient at home and left voice message informing the patient that the request was granted.

## 2012-02-26 NOTE — Patient Instructions (Signed)
[facility] Discharge Instructions for Patients Receiving Chemotherapy  Today you received the following chemotherapy agents Herceptin.  To help prevent nausea and vomiting after your treatment, we encourage you to take your nausea medication.   If you develop nausea and vomiting that is not controlled by your nausea medication, call the clinic. If it is after clinic hours your family physician or the after hours number for the clinic or go to the Emergency Department.   BELOW ARE SYMPTOMS THAT SHOULD BE REPORTED IMMEDIATELY:  *FEVER GREATER THAN 100.5 F  *CHILLS WITH OR WITHOUT FEVER  NAUSEA AND VOMITING THAT IS NOT CONTROLLED WITH YOUR NAUSEA MEDICATION  *UNUSUAL SHORTNESS OF BREATH  *UNUSUAL BRUISING OR BLEEDING  TENDERNESS IN MOUTH AND THROAT WITH OR WITHOUT PRESENCE OF ULCERS  *URINARY PROBLEMS  *BOWEL PROBLEMS  UNUSUAL RASH Items with * indicate a potential emergency and should be followed up as soon as possible.  One of the nurses will contact you 24 hours after your treatment. Please let the nurse know about any problems that you may have experienced. Feel free to call the clinic you have any questions or concerns. The clinic phone number is [phone].   I have been informed and understand all the instructions given to me. I know to contact the clinic, my physician, or go to the Emergency Department if any problems should occur. I do not have any questions at this time, but understand that I may call the clinic during office hours   should I have any questions or need assistance in obtaining follow up care.    __________________________________________  _____________  __________ Signature of Patient or Authorized Representative            Date                   Time    __________________________________________ Nurse's Signature    

## 2012-02-26 NOTE — Progress Notes (Signed)
[facility]  Name: [name]                  DATE: 02/26/2012 MRN: [id number]                      DOB: Jan 02, 1946  CC: [name], MD          REFERRING PHYSICIAN: [name], MD  DIAGNOSIS: Patient Active Problem List  Diagnoses Date Noted  . 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 Final  pathology revealed 1.9 cm, infiltrating ductal carcinoma with CISH ratio of 9.3 and pathologic staging of T1c N0, Stage IA.    PRIOR THERAPY:  1. Right simple mastectomy with right axillary sentinel node biopsy and left subclavian power port 06/26/2011. Final pathology revealed 1.9 cm invasive ductal carcinoma, ER/PR positive, HER-2/neu positive with a ratio of 9.73. Sentinel node biopsy negative. Pathologic stage TIcC. N0 M0, Stage IA right breast cancer.  2. Weekly Taxotere carboplatinum and Herceptin beginning November 2012. However, had considerable side effects requiring discontinuation/holding of the chemotherapy.  3. Aromasin 25 mg daily, began May 2013.   CURRENT THERAPY: Herceptin every 3 weeks, will continue for 1 year.   INTERIM HISTORY: Due for herceptin only today. Feels much improved after discontinuing chemotherapy. Dizziness and syncopal episodes have resolved. Has resumed Lisinopril, blood pressures have been stable.Has follow-up echo scheduled 03/15/12. Has reconstruction surgery scheduled for 7/25, is debating silicone vs saline implant. Taste changes associated with chemo have resolved. No nausea or vomiting. Appetite is good, and bowel and bladder function normal. No infections. No headache or blurred vision. No cough or shortness of breath. No abdominal pain or new bone pain. Chronic osteoarthritis, worse after chemo and antiestrogen. Complains of increased hand and knee pain. Cannot take NSAIDS after lap band surgery.  Remainder of  the 10 point  review of systems is negative.  PHYSICAL EXAM: BP 107/72  Pulse 80  Temp 98.4 F (36.9 C) (Oral)  Ht 5' 3.5" (1.613 m)  Wt 222 lb 1.6 oz (100.744 kg)  BMI 38.73 kg/m2 General: Well developed, well nourished, appears stroner, no obvious distress. Alone at today's visit. Walks steadily without a cane or walker.  EENT: No ocular or oral lesions. No stomatitis.  Respiratory: Lungs are clear to auscultation bilaterally with normal respiratory movement and no accessory muscle use. Cardiac: No murmur, rub or tachycardia. No upper or lower extremity edema.  GI: Abdomen is soft, no palpable hepatosplenomegaly. No fluid wave. No tenderness. Musculoskeletal: No kyphosis, no tenderness over the spine, ribs or hips. Lymph: No cervical, infraclavicular, axillary or inguinal adenopathy. Neuro: No focal neurological deficits. Psych: Alert and oriented X 3, appropriate mood and affect.    SOCIAL HISTORY:  . 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 Smoking status: Former Games developer  . Alcohol Use: 0.6 oz/week    1 Glasses of wine per week   LABORATORY STUDIES:   Results for orders placed in visit on 02/26/12  CBC WITH DIFFERENTIAL      Component Value Range   WBC 5.7  3.9 - 10.3 10e3/uL   NEUT# 2.2  1.5 - 6.5 10e3/uL   HGB 13.4  11.6 - 15.9 g/dL   HCT 91.4  78.2 - 95.6 %   Platelets 196  145 - 400 10e3/uL  MCV 87.7  79.5 - 101.0 fL   MCH 30.0  25.1 - 34.0 pg   MCHC 34.3  31.5 - 36.0 g/dL   RBC 1.61  0.96 - 0.45 10e6/uL   RDW 13.4  11.2 - 14.5 %   lymph# 2.7  0.9 - 3.3 10e3/uL   MONO# 0.6  0.1 - 0.9 10e3/uL   Eosinophils Absolute 0.2  0.0 - 0.5 10e3/uL   Basophils Absolute 0.0  0.0 - 0.1 10e3/uL   NEUT% 38.9  38.4 - 76.8 %   LYMPH% 47.0  14.0 - 49.7 %   MONO% 10.6  0.0 - 14.0 %   EOS% 2.8  0.0 - 7.0 %   BASO% 0.7  0.0 - 2.0 %    IMPRESSION:  66 year old white female with: 1. Stage I right breast  cancer, ER/PR positive, HER-2/neu positive, on Herceptin only with good tolerance.  2. Echo scheduled for 03/15/12. 3. Breast reconstruction planned for 04/07/12.   4. Osteoarthritis pain, exacerbated with Aromasin. Uses Tramadol.    PLAN:   1. Treatment today, Herceptin only. Will continue Herceptin only to complete one year.  2. Return 7/3 for appt with me prior to next Herceptin. Lab orders entered for that visit. 3. Follow-up with Dr. Jones Broom to monitor cardiac function while on Herceptin. Appt 03/15/12.  4. Will defer Herceptin treatment scheduled 7/26 to 8/2 for surgery on 7/25.

## 2012-02-26 NOTE — Telephone Encounter (Signed)
Per staff message I have scheduled appts. JMW  

## 2012-03-15 ENCOUNTER — Ambulatory Visit (HOSPITAL_COMMUNITY)
Admission: RE | Admit: 2012-03-15 | Discharge: 2012-03-15 | Disposition: A | Discharge status: Home / Self Care | Payer: BC Managed Care – PPO | Source: Ambulatory Visit | Attending: Adult Health | Admitting: Adult Health

## 2012-03-15 ENCOUNTER — Ambulatory Visit (HOSPITAL_COMMUNITY)
Admission: RE | Admit: 2012-03-15 | Discharge: 2012-03-15 | Disposition: A | Discharge status: Home / Self Care | Payer: BC Managed Care – PPO | Source: Ambulatory Visit | Attending: Internal Medicine | Admitting: Internal Medicine

## 2012-03-15 ENCOUNTER — Encounter (HOSPITAL_COMMUNITY): Payer: Self-pay

## 2012-03-15 VITALS — BP 110/66 | HR 89 | Ht 63.5 in | Wt 225.0 lb

## 2012-03-15 DIAGNOSIS — Z9010 Acquired absence of unspecified breast and nipple: Secondary | ICD-10-CM | POA: Insufficient documentation

## 2012-03-15 DIAGNOSIS — C50919 Malignant neoplasm of unspecified site of unspecified female breast: Secondary | ICD-10-CM | POA: Insufficient documentation

## 2012-03-15 DIAGNOSIS — E785 Hyperlipidemia, unspecified: Secondary | ICD-10-CM | POA: Insufficient documentation

## 2012-03-15 DIAGNOSIS — I517 Cardiomegaly: Secondary | ICD-10-CM | POA: Diagnosis not present

## 2012-03-15 DIAGNOSIS — Z9221 Personal history of antineoplastic chemotherapy: Secondary | ICD-10-CM | POA: Insufficient documentation

## 2012-03-15 DIAGNOSIS — Z8249 Family history of ischemic heart disease and other diseases of the circulatory system: Secondary | ICD-10-CM | POA: Insufficient documentation

## 2012-03-15 DIAGNOSIS — R609 Edema, unspecified: Secondary | ICD-10-CM | POA: Insufficient documentation

## 2012-03-15 NOTE — Assessment & Plan Note (Addendum)
ECHO results reviewed during office visit with Dr Gala Romney. EF and lateral S' stable. No evidence of cardiotoxicity. Follow up in 3 months with an ECHO  Attending: Patient seen and examined with Tonye Becket, NP. We discussed all aspects of the encounter. I agree with the assessment and plan as stated above.  Echo images reviewed personally in clinic. All parameters are stable. Continue Herceptin.

## 2012-03-15 NOTE — Progress Notes (Signed)
Patient ID: YURIKO PORTALES, female   DOB: [birth year], 66 y.o.   MRN: [id number] Patient ID: DEEPTI GUNAWAN, female   DOB: 1946-05-20, 66 y.o.   MRN: [id number] Referring Physician: Drue Second ,MD Primary Physician: Mertha Finders, MD  Reason for Consultation: Herceptin HPI: Dinah Beers is a 66 year old woman with history of multicentric ER/PR, HER2-positive right breast carcinoma, status post right simple mastectomy with axillary node dissection on 06/26/11 with final path revealing 1.9-cm invasive ductal carcinoma with a Cage ratio of 9.73, pathologic staging of T1c N0. She has hsitory of depression, HTN, Hyperlipidemia, and fibromyalgia.  Diagnosed with her breast CA in September 2012. In November, started Taxotere, carboplatinum and Herceptin.    11/12 ECHO  EF 55-60% grade I diastolic dysfunction Lateral s' 11.4 cm/s 11/18/11 ECHO EF 60% Lateral S' 11.7 03/15/12 ECHo 60% lateral S' 12.3  She returns for follow up. Complains of R great toe redness. Denies SOB/PND/Orthopnea. On HCTZ 25 mg daily.   Review of Systems:     Cardiac Review of Systems: {Y] = yes [ ]  = no  Chest Pain [    ]  Resting SOB [   ] Exertional SOB  [Y  ]  Orthopnea [  ]   Pedal Edema [   ]    Palpitations [  ] Syncope  [  ]   Presyncope [   ]  General Review of Systems: [Y] = yes [  ]=no Constitional: recent weight change [  ]; anorexia [  ]; fatigue [ Y ]; nausea [  ]; night sweats [  ]; fever [  ]; or chills [  ];                                                                                                                                          Dental: poor dentition[  ];   Eye : blurred vision [  ]; diplopia [   ]; vision changes [  ];  Amaurosis fugax[  ]; Resp: cough [  ];  wheezing[  ];  hemoptysis[  ]; shortness of breath[  ]; paroxysmal nocturnal dyspnea[  ]; dyspnea on exertion[  ]; or orthopnea[  ];  GI:  gallstones[  ], vomiting[  ];  dysphagia[  ]; melena[  ];  hematochezia [  ]; heartburn[  ];   Hx of  Colonoscopy[   ]; GU: kidney stones [  ]; hematuria[  ];   dysuria [  ];  nocturia[  ];  history of     obstruction [  ];                 Skin: rash, swelling[  ];, hair loss[  ];  peripheral edema[  ];  or itching[  ]; Musculosketetal: myalgias[Y  ];  joint swelling[  ];  joint erythema[  ];  joint pain[Y  ];  back pain[  ];  Heme/Lymph: bruising[  ];  bleeding[  ];  anemia[  ];  Neuro: TIA[  ];  headaches[  ];  stroke[  ];  vertigo[  ];  seizures[  ];   paresthesias[  ];  difficulty walking[  ];  Psych:depression[Y  ]; anxiety[  ];  Endocrine: diabetes[  ];  thyroid dysfunction[  ];  Immunizations: Flu [  ]; Pneumococcal[  ];  Other:  Past Medical History  Diagnosis Date  . Allergy   . Asthma   . Hypertension   . Hyperlipidemia   . Depression   . Fibromyalgia   . Arthritis     feet, knee and base of thumbs  . Osteopenia   . Carpal tunnel syndrome     right  . Breast cancer, stage 1 07/18/2011  . Dehydration 10/21/2011     (Not in a hospital admission)   No Known Allergies  History   Social History  . Marital Status: Single    Spouse Name: N/A    Number of Children: N/A  . Years of Education: N/A   Occupational History  . Not on file.   Social History Main Topics  . Smoking status: Former Games developer  . Smokeless tobacco: Not on file  . Alcohol Use: 0.6 oz/week    1 Glasses of wine per week  . Drug Use: Not on file  . Sexually Active: Not on file   Other Topics Concern  . Not on file   Social History Narrative  . No narrative on file    Family History  Problem Relation Age of Onset  . Breast cancer Paternal Aunt   . Breast cancer Paternal Grandmother     PHYSICAL EXAM: Filed Vitals:   03/15/12 1447  BP: 110/66  Pulse: 89   General:NAD  No respiratory difficulty HEENT: normal Neck: supple. no JVD. Carotids 2+ bilat; no bruits. No lymphadenopathy or thryomegaly appreciated. Cor: PMI nondisplaced. Mildly irregular. No rubs, gallops or murmurs. Lungs:  clear Abdomen: soft, nontender, nondistended. No hepatosplenomegaly. No bruits or masses. Good bowel sounds. Extremities: no cyanosis, clubbing, rash, edema R great toe erythema  Neuro: alert & oriented x 3, cranial nerves grossly intact. moves all 4 extremities w/o difficulty. Affect pleasant.   ASSESSMENT/PLAN:

## 2012-03-15 NOTE — Patient Instructions (Addendum)
Follow up in 3 months with an ECHO 

## 2012-03-15 NOTE — Progress Notes (Signed)
  Echocardiogram 2D Echocardiogram has been performed.  Cathie Beams 03/15/2012, 2:44 PM

## 2012-03-16 ENCOUNTER — Encounter: Payer: Self-pay | Admitting: Oncology

## 2012-03-16 ENCOUNTER — Ambulatory Visit: Payer: BC Managed Care – PPO

## 2012-03-16 ENCOUNTER — Other Ambulatory Visit: Payer: BC Managed Care – PPO | Admitting: Lab

## 2012-03-16 ENCOUNTER — Ambulatory Visit (HOSPITAL_BASED_OUTPATIENT_CLINIC_OR_DEPARTMENT_OTHER): Payer: BC Managed Care – PPO | Admitting: Oncology

## 2012-03-16 VITALS — BP 121/78 | HR 73 | Temp 97.5°F | Ht 63.5 in | Wt 227.6 lb

## 2012-03-16 DIAGNOSIS — M79609 Pain in unspecified limb: Secondary | ICD-10-CM

## 2012-03-16 DIAGNOSIS — Z170 Estrogen receptor positive status [ER+]: Secondary | ICD-10-CM | POA: Diagnosis not present

## 2012-03-16 DIAGNOSIS — C50919 Malignant neoplasm of unspecified site of unspecified female breast: Secondary | ICD-10-CM

## 2012-03-16 DIAGNOSIS — Z09 Encounter for follow-up examination after completed treatment for conditions other than malignant neoplasm: Secondary | ICD-10-CM

## 2012-03-16 LAB — CBC WITH DIFFERENTIAL/PLATELET
BASO%: 0.6 % (ref 0.0–2.0)
Eosinophils Absolute: 0.2 10*3/uL (ref 0.0–0.5)
HCT: 37.2 % (ref 34.8–46.6)
LYMPH%: 42.5 % (ref 14.0–49.7)
MONO#: 0.6 10*3/uL (ref 0.1–0.9)
NEUT#: 2.8 10*3/uL (ref 1.5–6.5)
NEUT%: 44.9 % (ref 38.4–76.8)
Platelets: 191 10*3/uL (ref 145–400)
RBC: 4.32 10*6/uL (ref 3.70–5.45)
WBC: 6.2 10*3/uL (ref 3.9–10.3)
lymph#: 2.6 10*3/uL (ref 0.9–3.3)

## 2012-03-16 NOTE — Progress Notes (Signed)
OFFICE PROGRESS NOTE  CC [name] M.D. [name] M.D.  [facility], [place] [address] [place] [address]  DIAGNOSIS: 66 year old female with multicentric right breast cancer that was ER positive PR positive HER-2/neu positive clinical stage I.  PRIOR THERAPY:  #1 status post right simple mastectomy with right axillary sentinel node biopsy and left subclavian power port insertion on 06/26/2011.  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 Sentinel node biopsy done at the time of her mastectomy was negative for metastatic disease. Pathologic stage TI C. N0 M0, stage I breast cancer.  #3 patient has been receiving weekly Taxotere carboplatinum and Herceptin beginning November 2012. However she has had considerable side effects requiring discontinuation/holding of the chemotherapy.  #4 S/P Taxotere/Crboplatin q 3 weeks with weekly Herceptin beginning 09/30/11 x 3 cycles completed on 11/11/11  #5. Herceptin q 21 days begin 12/02/11  #6 adjuvant Aromasin 25 mg daily starting 02/05/2012.  CURRENT THERAPY: Herceptin q 21 days starting 12/02/11 and aromasin 25 mg daily beginning 02/05/12  INTERVAL HISTORY: [name] 66 y.o. female followup visit today for her Scheduled Herceptin today. She is on Aromasin as well she does have complaints of myalgias and arthralgias slightly more than previously. She is taking tramadol for this. Patient also has noticed redness in the great toe with possible nail infection she is on what sounds like Keflex. She was started on this by her primary care physician. She is experiencing pain in the toe. On examination today the toe does look read the nail is about to come off. It is not warm to touch. There is no discharge. It is aching and throbbing. She continues to work. She has no bleeding problems no chest pains. She was seen by Dr. Maryan Rued  and has had an echocardiogram performed and it looks great per patient report. Remainder of the 10 point review of systems is negative. MEDICAL HISTORY: Past Medical History  Diagnosis Date  . Allergy   . Asthma   . Hypertension   . Hyperlipidemia   . Depression   . Fibromyalgia   . Arthritis     feet, knee and base of thumbs  . Osteopenia   . Carpal tunnel syndrome     right  . Breast cancer, stage 1 07/18/2011  . Dehydration 10/21/2011    ALLERGIES:   has no known allergies.  MEDICATIONS:  Current Outpatient Prescriptions  Medication Sig Dispense Refill  . alendronate (FOSAMAX) 70 MG tablet Take 70 mg by mouth every 7 (seven) days. Take with a full glass of water on an empty stomach.      Marland Kitchen atorvastatin (LIPITOR) 20 MG tablet Take 20 mg by mouth daily.        . calcium gluconate 500 MG tablet Take 500 mg by mouth daily.        . cholecalciferol (VITAMIN D) 1000 UNITS tablet Take 2,000 Units by mouth daily.        . DULoxetine (CYMBALTA) 60 MG capsule Take 120 mg by mouth daily.       Marland Kitchen exemestane (AROMASIN) 25 MG tablet Take 1 tablet by mouth daily.      Marland Kitchen gabapentin (NEURONTIN) 600 MG tablet Take 600 mg by mouth daily.       . hydrochlorothiazide (HYDRODIURIL) 25 MG tablet Take 25 mg by mouth daily.      Boris Lown Oil 300 MG CAPS Take  1 capsule by mouth daily.        Marland Kitchen lidocaine-prilocaine (EMLA) cream Apply 1 application topically as needed.        . loratadine (CLARITIN) 10 MG tablet Take 10 mg by mouth daily.        . magnesium 30 MG tablet Take 500 mg by mouth daily.       . Multiple Vitamin (MULTIVITAMIN) capsule Take 1 capsule by mouth daily.        . traMADol (ULTRAM) 50 MG tablet Take 1 tablet (50 mg total) by mouth as needed for pain (PRN for osteoarthritis and fibromyalgia pain. ).  90 tablet  0  . DISCONTD: diphenhydrAMINE (SOMINEX) 25 MG tablet Take 25 mg by mouth at bedtime as needed.        Marland Kitchen DISCONTD: lisinopril (PRINIVIL,ZESTRIL) 10 MG tablet Take 10 mg by mouth  daily.        SURGICAL HISTORY:  Past Surgical History  Procedure Date  . Tonsillectomy 1950  . Tubal ligation 1978  . Abdominal hysterectomy 1981  . Right ear surgery 1983    tumor removed (?)  . Right breast lumpectomy 1986  . Ethmoidectomy 1991  . Knee arthroscopy 1996, 2001    left  . Facial plastic surgery 1996  . Bunionectomy 1998    left foot  . Total knee arthroplasty 2006    left  . Nasal sinus surgery 2001  . Lap band surgery 2010  . Right mastectomy 06-26-11  . Port-a-cath placement 06/26/11    tip in lower SVC per chest x-ray read by Dr. Dwyane Dee    REVIEW OF SYSTEMS:  A comprehensive review of systems was negative.   PHYSICAL EXAMINATION: General appearance: alert, cooperative and no distress Neck: no adenopathy, no carotid bruit, no JVD, supple, symmetrical, trachea midline and thyroid not enlarged, symmetric, no tenderness/mass/nodules Lymph nodes: Cervical, supraclavicular, and axillary nodes normal. Resp: clear to auscultation bilaterally and normal percussion bilaterally Back: symmetric, no curvature. ROM normal. No CVA tenderness. Cardio: regular rate and rhythm, S1, S2 normal, no murmur, click, rub or gallop GI: soft, non-tender; bowel sounds normal; no masses,  no organomegaly Extremities: extremities normal, atraumatic, no cyanosis or edema Neurologic: Grossly normal Left breast no masses nipple discharge no skin changes. Right mastectomy scar is healing well. There still is some scar tissue formation and some scabbing. But there is no signs of infection para  ECOG PERFORMANCE STATUS: 0 - Asymptomatic  Blood pressure 121/78, pulse 73, temperature 97.5 F (36.4 C), temperature source Oral, height 5' 3.5" (1.613 m), weight 227 lb 9.6 oz (103.239 kg).  LABORATORY DATA: Lab Results  Component Value Date   WBC 6.2 03/16/2012   HGB 12.5 03/16/2012   HCT 37.2 03/16/2012   MCV 86.1 03/16/2012   PLT 191 03/16/2012      Chemistry      Component Value  Date/Time   NA 137 02/26/2012 1017   K 3.9 02/26/2012 1017   CL 101 02/26/2012 1017   CO2 27 02/26/2012 1017   BUN 22 02/26/2012 1017   CREATININE 0.71 02/26/2012 1017      Component Value Date/Time   CALCIUM 9.8 02/26/2012 1017   ALKPHOS 76 12/23/2011 0935   AST 31 12/23/2011 0935   ALT 22 12/23/2011 0935   BILITOT 0.7 12/23/2011 0935       RADIOGRAPHIC STUDIES:  No results found.  ASSESSMENT: 67 year old female with   #1 stage I HER-2/neu positive breast cancer. Her final stage was  1.9 cm high-grade poorly differentiated invasive ductal carcinoma no LVI ER positive PR positive HER-2/neu positive. She underwent a mastectomy. No sentinel nodes were involved with disease.   #2. Completed chemotherapy but continue Herceptin q 3 weeks to complete out one year of therapy. All of the Herceptin will be completed towards the end of November 2013.  #3 patient is on Aromasin 25 mg daily since 3 weeks ago. She seems to be tolerating it well although she does tell me that she is having myalgias and arthralgias but they're very tolerable. She does have history of arthritis and she normally does take tramadol and she continues to take this.   #4 possible infection of the right great toe. She will be referred to podiatrist urgently.  PLAN:   #1 patient will proceed with her Herceptin on Friday.  #2 she does have significant ongoing possible cellulitis of the right foot she is on oral antibiotics I have referred her to podiatry for further evaluation especially of her great toe on the right side. I am concerned about the nail.  #3 she knows to call me with any problems questions or concerns. I will plan on seeing her back on 04/15/2012. All questions were answered. The patient knows to call the clinic with any problems, questions or concerns. We can certainly see the patient much sooner if necessary.  I spent 30 minutes counseling the patient face to face. The total time spent in the appointment was  30 minutes.    Drue Second, MD Medical/Oncology [facility] [phone] (beeper) [phone] (Office)  03/16/2012, 11:03 AM

## 2012-03-16 NOTE — Patient Instructions (Addendum)
1. Podiatry consult  2. Proceed with herceptin.  3. I will see you back on 04/15/12

## 2012-03-18 ENCOUNTER — Ambulatory Visit: Payer: BC Managed Care – PPO

## 2012-03-18 ENCOUNTER — Ambulatory Visit (HOSPITAL_BASED_OUTPATIENT_CLINIC_OR_DEPARTMENT_OTHER): Payer: BC Managed Care – PPO

## 2012-03-18 VITALS — BP 121/80 | HR 73 | Temp 98.4°F

## 2012-03-18 DIAGNOSIS — Z5112 Encounter for antineoplastic immunotherapy: Secondary | ICD-10-CM

## 2012-03-18 DIAGNOSIS — C50919 Malignant neoplasm of unspecified site of unspecified female breast: Secondary | ICD-10-CM | POA: Diagnosis not present

## 2012-03-18 MED ORDER — SODIUM CHLORIDE 0.9 % IV SOLN
Freq: Once | INTRAVENOUS | Status: AC
  Administered 2012-03-18: 14:00:00 via INTRAVENOUS

## 2012-03-18 MED ORDER — ACETAMINOPHEN 325 MG PO TABS
650.0000 mg | ORAL_TABLET | Freq: Once | ORAL | Status: AC
  Administered 2012-03-18: 650 mg via ORAL

## 2012-03-18 MED ORDER — HEPARIN SOD (PORK) LOCK FLUSH 100 UNIT/ML IV SOLN
500.0000 [IU] | Freq: Once | INTRAVENOUS | Status: AC | PRN
  Administered 2012-03-18: 500 [IU]
  Filled 2012-03-18: qty 5

## 2012-03-18 MED ORDER — SODIUM CHLORIDE 0.9 % IJ SOLN
10.0000 mL | INTRAMUSCULAR | Status: DC | PRN
  Administered 2012-03-18: 10 mL
  Filled 2012-03-18: qty 10

## 2012-03-18 MED ORDER — TRASTUZUMAB CHEMO INJECTION 440 MG
6.0000 mg/kg | Freq: Once | INTRAVENOUS | Status: AC
  Administered 2012-03-18: 609 mg via INTRAVENOUS
  Filled 2012-03-18: qty 29

## 2012-03-18 NOTE — Patient Instructions (Signed)
[facility] Discharge Instructions for Patients Receiving Chemotherapy  Today you received the following chemotherapy agents Herceptin.  To help prevent nausea and vomiting after your treatment, we encourage you to take your nausea medication.   If you develop nausea and vomiting that is not controlled by your nausea medication, call the clinic. If it is after clinic hours your family physician or the after hours number for the clinic or go to the Emergency Department.   BELOW ARE SYMPTOMS THAT SHOULD BE REPORTED IMMEDIATELY:  *FEVER GREATER THAN 100.5 F  *CHILLS WITH OR WITHOUT FEVER  NAUSEA AND VOMITING THAT IS NOT CONTROLLED WITH YOUR NAUSEA MEDICATION  *UNUSUAL SHORTNESS OF BREATH  *UNUSUAL BRUISING OR BLEEDING  TENDERNESS IN MOUTH AND THROAT WITH OR WITHOUT PRESENCE OF ULCERS  *URINARY PROBLEMS  *BOWEL PROBLEMS  UNUSUAL RASH Items with * indicate a potential emergency and should be followed up as soon as possible.  One of the nurses will contact you 24 hours after your treatment. Please let the nurse know about any problems that you may have experienced. Feel free to call the clinic you have any questions or concerns. The clinic phone number is [phone].   I have been informed and understand all the instructions given to me. I know to contact the clinic, my physician, or go to the Emergency Department if any problems should occur. I do not have any questions at this time, but understand that I may call the clinic during office hours   should I have any questions or need assistance in obtaining follow up care.    __________________________________________  _____________  __________ Signature of Patient or Authorized Representative            Date                   Time    __________________________________________ Nurse's Signature    

## 2012-03-21 ENCOUNTER — Telehealth: Payer: Self-pay | Admitting: *Deleted

## 2012-03-21 NOTE — Telephone Encounter (Signed)
Per staff message and POF I have scheduled appts.  JMW  

## 2012-04-06 ENCOUNTER — Ambulatory Visit (HOSPITAL_BASED_OUTPATIENT_CLINIC_OR_DEPARTMENT_OTHER): Admit: 2012-04-06 | Payer: Self-pay | Admitting: Plastic Surgery

## 2012-04-06 ENCOUNTER — Encounter (HOSPITAL_BASED_OUTPATIENT_CLINIC_OR_DEPARTMENT_OTHER): Payer: Self-pay

## 2012-04-06 SURGERY — CAPSULECTOMY, BREAST, WITH REPLACEMENT OF IMPLANT
Anesthesia: General | Laterality: Right

## 2012-04-07 ENCOUNTER — Encounter (HOSPITAL_BASED_OUTPATIENT_CLINIC_OR_DEPARTMENT_OTHER): Payer: Self-pay | Admitting: *Deleted

## 2012-04-07 NOTE — Progress Notes (Signed)
To come in for bmet-had ekg 3/13-doing well-does have post op n/v-may need scope patch

## 2012-04-08 ENCOUNTER — Ambulatory Visit: Payer: BC Managed Care – PPO | Admitting: Oncology

## 2012-04-08 ENCOUNTER — Other Ambulatory Visit: Payer: Self-pay | Admitting: Plastic Surgery

## 2012-04-08 ENCOUNTER — Other Ambulatory Visit: Payer: BC Managed Care – PPO | Admitting: Lab

## 2012-04-08 ENCOUNTER — Ambulatory Visit: Payer: BC Managed Care – PPO

## 2012-04-08 DIAGNOSIS — Z9010 Acquired absence of unspecified breast and nipple: Secondary | ICD-10-CM

## 2012-04-08 NOTE — H&P (Signed)
History  and Physical  [name]   04/01/2012 9:00 AM Office Visit  MRN: [id number]  Department:  Plastic Surgery  Dept Phone: [phone]  Description: Female DOB: 12/20/1945  Provider: Fanny Bien Rayburn, PA-C    Diagnoses    CA - breast cancer   - Primary   174.9      Reason for Visit  -  Breast Reconstruction     Subjective:    Patient ID: [name] is a 66 y.o. female.  HPI [name] is a 66 yrs old wf here for post operative follow up after right mastectomy with SLN and immediate reconstruction with expander/FlexHD placement 06/26/11. 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 No radiation is planned. She has not had any fevers or signs of infection. The expander is in place but deflated again. The incision is healing well and hasnot had any drainage.  The skin flaps are viable with good capillary refill. She denies any leg pain or chest pain. Her hair is growing back and she is wanting to have the second stage reconstructive surgery. We did refill the right expander with 400 cc of sterile saline.  The following portions of the patient's history were reviewed and updated as appropriate: allergies, current medications, past family history, past medical history, past social history, past surgical history and problem list.  Review of Systems  Constitutional: Negative.   HENT: Negative.   Eyes: Negative.   Respiratory: Negative.   Cardiovascular: Negative.   Gastrointestinal: Negative.   Genitourinary: Negative.   Musculoskeletal: Negative.   Neurological: Negative.   Hematological: Negative.   Psychiatric/Behavioral: Negative.    Current outpatient prescriptions:alendronate (FOSAMAX) 70 MG tablet  atorvastatin (LIPITOR) 20 MG tablet, Take 20 mg by mouth daily   DULoxetine (CYMBALTA) 60 MG capsule, Take 60 mg by mouth daily  exemestane (AROMASIN) 25 mg tablet  gabapentin  (NEURONTIN) 600 MG tablet  3 (three) times daily.   hydrochlorothiazide (HYDRODIURIL) 25 MG tablet, Take 25 mg by mouth daily.    LISINOPRIL ORAL, Take by mouth  prochlorperazine (COMPAZINE) 10 MG tablet,    TAZORAC 0.1 % cream TRAMADOL HCL (TRAMADOL ORAL) cephalexin (KEFLEX) 500 MG capsule4 times daily diazepam (VALIUM) 2 MG tablet every 12 (twelve) hours as needed for 10 days for Anxiety     docusate sodium (COLACE) 100 MG capsule 3 times daily.  HYDROcodone-acetaminophen (NORCO) 5-325 mg per tablet every 6 (six) hours as needed for 10 days for Pain ondansetron (ZOFRAN) 8 MG tablet,  promethazine (PHENERGAN) 12.5 MG tablet, Take 1 tablet (12.5 mg total) by mouth every 6 (six) hours as needed for 7 days for Nausea  sulfamethoxazole-trimethoprim (BACTRIM DS) 800-160 mg per tablet,  No Known Allergies  Objective:    Physical Exam  Constitutional: She is oriented to person, place, and time. She appears well-developed and well-nourished. No distress.  HENT:   Head: Normocephalic and atraumatic.   Nose: Nose normal.   Mouth/Throat: Oropharynx is clear and moist.  Eyes: Conjunctivae normal and EOM are normal. Pupils are equal, round, and reactive to light.  Neck: Normal range of motion. Neck supple.  Cardiovascular: Normal rate, regular rhythm and intact distal pulses.  Exam reveals no gallop and no friction rub.    No murmur heard. Pulmonary/Chest: Effort normal and breath sounds normal. No respiratory distress. She exhibits no tenderness.       Right breast incision is well healed  Abdominal: Soft. Bowel sounds are normal.  Musculoskeletal: Normal range of motion.  Neurological: She is alert and oriented to person, place, and time.  Skin: Skin is warm and dry.  Psychiatric: She has a normal mood and affect. Her behavior is normal. Judgment and thought content normal.      Assessment:    Breast Cancer                 Plan:  Will proceed with Right expander removal and silicone  implant placement on right and left breast mastopexy for symmetry.   The risk of surgery including bleeding, pain, risk of anesthesia and capsule contracture were discussed with the patient and she is agreeable to proceed.  Electronically signed by: Shawn Montgomery Rayburn, PA-C 04/01/2012 9:58 AM

## 2012-04-12 ENCOUNTER — Encounter (HOSPITAL_BASED_OUTPATIENT_CLINIC_OR_DEPARTMENT_OTHER)
Admission: RE | Admit: 2012-04-12 | Discharge: 2012-04-12 | Disposition: A | Discharge status: Home / Self Care | Payer: BC Managed Care – PPO | Source: Ambulatory Visit | Attending: Plastic Surgery | Admitting: Plastic Surgery

## 2012-04-12 LAB — BASIC METABOLIC PANEL
CO2: 26 mEq/L (ref 19–32)
Calcium: 9.8 mg/dL (ref 8.4–10.5)
Chloride: 98 mEq/L (ref 96–112)
Glucose, Bld: 103 mg/dL — ABNORMAL HIGH (ref 70–99)
Sodium: 136 mEq/L (ref 135–145)

## 2012-04-13 ENCOUNTER — Ambulatory Visit (HOSPITAL_BASED_OUTPATIENT_CLINIC_OR_DEPARTMENT_OTHER): Payer: BC Managed Care – PPO | Admitting: Anesthesiology

## 2012-04-13 ENCOUNTER — Encounter (HOSPITAL_BASED_OUTPATIENT_CLINIC_OR_DEPARTMENT_OTHER): Payer: Self-pay | Admitting: Anesthesiology

## 2012-04-13 ENCOUNTER — Ambulatory Visit (HOSPITAL_BASED_OUTPATIENT_CLINIC_OR_DEPARTMENT_OTHER)
Admission: RE | Admit: 2012-04-13 | Discharge: 2012-04-13 | Disposition: A | Discharge status: Home / Self Care | Payer: BC Managed Care – PPO | Source: Ambulatory Visit | Attending: Plastic Surgery | Admitting: Plastic Surgery

## 2012-04-13 ENCOUNTER — Encounter (HOSPITAL_BASED_OUTPATIENT_CLINIC_OR_DEPARTMENT_OTHER)
Admission: RE | Disposition: A | Discharge status: Home / Self Care | Payer: Self-pay | Source: Ambulatory Visit | Attending: Plastic Surgery

## 2012-04-13 ENCOUNTER — Encounter (HOSPITAL_BASED_OUTPATIENT_CLINIC_OR_DEPARTMENT_OTHER): Payer: Self-pay | Admitting: *Deleted

## 2012-04-13 DIAGNOSIS — N62 Hypertrophy of breast: Secondary | ICD-10-CM | POA: Diagnosis present

## 2012-04-13 DIAGNOSIS — J45909 Unspecified asthma, uncomplicated: Secondary | ICD-10-CM | POA: Insufficient documentation

## 2012-04-13 DIAGNOSIS — Z9010 Acquired absence of unspecified breast and nipple: Secondary | ICD-10-CM | POA: Insufficient documentation

## 2012-04-13 DIAGNOSIS — N641 Fat necrosis of breast: Secondary | ICD-10-CM | POA: Diagnosis not present

## 2012-04-13 DIAGNOSIS — N6019 Diffuse cystic mastopathy of unspecified breast: Secondary | ICD-10-CM | POA: Diagnosis not present

## 2012-04-13 DIAGNOSIS — Z853 Personal history of malignant neoplasm of breast: Secondary | ICD-10-CM | POA: Insufficient documentation

## 2012-04-13 DIAGNOSIS — I10 Essential (primary) hypertension: Secondary | ICD-10-CM | POA: Insufficient documentation

## 2012-04-13 DIAGNOSIS — N651 Disproportion of reconstructed breast: Secondary | ICD-10-CM | POA: Insufficient documentation

## 2012-04-13 DIAGNOSIS — Z01812 Encounter for preprocedural laboratory examination: Secondary | ICD-10-CM | POA: Insufficient documentation

## 2012-04-13 HISTORY — PX: MASTOPEXY: SHX5358

## 2012-04-13 HISTORY — DX: Nausea with vomiting, unspecified: Z98.890

## 2012-04-13 HISTORY — DX: Nausea with vomiting, unspecified: R11.2

## 2012-04-13 LAB — POCT HEMOGLOBIN-HEMACUE: Hemoglobin: 12.6 g/dL (ref 12.0–15.0)

## 2012-04-13 SURGERY — CAPSULECTOMY, BREAST, WITH REPLACEMENT OF IMPLANT
Anesthesia: General | Site: Breast | Laterality: Right | Wound class: Clean

## 2012-04-13 MED ORDER — FENTANYL CITRATE 0.05 MG/ML IJ SOLN
INTRAMUSCULAR | Status: DC | PRN
  Administered 2012-04-13: 50 ug via INTRAVENOUS
  Administered 2012-04-13: 100 ug via INTRAVENOUS

## 2012-04-13 MED ORDER — GLYCOPYRROLATE 0.2 MG/ML IJ SOLN
INTRAMUSCULAR | Status: DC | PRN
  Administered 2012-04-13: 0.2 mg via INTRAVENOUS

## 2012-04-13 MED ORDER — PROPOFOL 10 MG/ML IV EMUL
INTRAVENOUS | Status: DC | PRN
  Administered 2012-04-13: 200 mg via INTRAVENOUS

## 2012-04-13 MED ORDER — PROMETHAZINE HCL 25 MG/ML IJ SOLN: 6.2500 mg | INTRAMUSCULAR | Status: DC | PRN

## 2012-04-13 MED ORDER — CEFAZOLIN SODIUM-DEXTROSE 2-3 GM-% IV SOLR
2.0000 g | INTRAVENOUS | Status: AC
  Administered 2012-04-13: 2 g via INTRAVENOUS

## 2012-04-13 MED ORDER — EPHEDRINE SULFATE 50 MG/ML IJ SOLN
INTRAMUSCULAR | Status: DC | PRN
  Administered 2012-04-13 (×4): 10 mg via INTRAVENOUS

## 2012-04-13 MED ORDER — SCOPOLAMINE 1 MG/3DAYS TD PT72
1.0000 | MEDICATED_PATCH | Freq: Once | TRANSDERMAL | Status: DC
  Administered 2012-04-13: 1.5 mg via TRANSDERMAL

## 2012-04-13 MED ORDER — MIDAZOLAM HCL 5 MG/5ML IJ SOLN
INTRAMUSCULAR | Status: DC | PRN
  Administered 2012-04-13: 2 mg via INTRAVENOUS

## 2012-04-13 MED ORDER — MIDAZOLAM HCL 2 MG/2ML IJ SOLN: 1.0000 mg | INTRAMUSCULAR | Status: DC | PRN

## 2012-04-13 MED ORDER — ACETAMINOPHEN 10 MG/ML IV SOLN
1000.0000 mg | Freq: Once | INTRAVENOUS | Status: AC
  Administered 2012-04-13: 1000 mg via INTRAVENOUS

## 2012-04-13 MED ORDER — SODIUM CHLORIDE 0.9 % IR SOLN
Status: DC | PRN
  Administered 2012-04-13: 12:00:00

## 2012-04-13 MED ORDER — LIDOCAINE HCL (CARDIAC) 20 MG/ML IV SOLN
INTRAVENOUS | Status: DC | PRN
  Administered 2012-04-13: 75 mg via INTRAVENOUS

## 2012-04-13 MED ORDER — SODIUM CHLORIDE 0.9 % IR SOLN
Status: DC | PRN
  Administered 2012-04-13: 09:00:00

## 2012-04-13 MED ORDER — HYDROCODONE-ACETAMINOPHEN 5-325 MG PO TABS: 1.0000 | ORAL_TABLET | Freq: Four times a day (QID) | ORAL | Status: AC | PRN

## 2012-04-13 MED ORDER — FENTANYL CITRATE 0.05 MG/ML IJ SOLN
25.0000 ug | INTRAMUSCULAR | Status: DC | PRN
  Administered 2012-04-13: 50 ug via INTRAVENOUS

## 2012-04-13 MED ORDER — NEOSTIGMINE METHYLSULFATE 1 MG/ML IJ SOLN
INTRAMUSCULAR | Status: DC | PRN
  Administered 2012-04-13: 2 mg via INTRAVENOUS

## 2012-04-13 MED ORDER — ROCURONIUM BROMIDE 100 MG/10ML IV SOLN
INTRAVENOUS | Status: DC | PRN
  Administered 2012-04-13: 50 mg via INTRAVENOUS

## 2012-04-13 MED ORDER — ONDANSETRON HCL 4 MG/2ML IJ SOLN
INTRAMUSCULAR | Status: DC | PRN
  Administered 2012-04-13: 4 mg via INTRAVENOUS

## 2012-04-13 MED ORDER — FENTANYL CITRATE 0.05 MG/ML IJ SOLN
50.0000 ug | INTRAMUSCULAR | Status: DC | PRN
  Administered 2012-04-13: 50 ug via INTRAVENOUS

## 2012-04-13 MED ORDER — DEXAMETHASONE SODIUM PHOSPHATE 4 MG/ML IJ SOLN
INTRAMUSCULAR | Status: DC | PRN
  Administered 2012-04-13: 10 mg via INTRAVENOUS

## 2012-04-13 MED ORDER — LACTATED RINGERS IV SOLN
INTRAVENOUS | Status: DC
  Administered 2012-04-13: 08:00:00 via INTRAVENOUS

## 2012-04-13 MED ORDER — PHENYLEPHRINE HCL 10 MG/ML IJ SOLN
INTRAMUSCULAR | Status: DC | PRN
  Administered 2012-04-13 (×5): 40 ug via INTRAVENOUS

## 2012-04-13 MED ORDER — BUPIVACAINE-EPINEPHRINE 0.25% -1:200000 IJ SOLN
INTRAMUSCULAR | Status: DC | PRN
  Administered 2012-04-13: 20 mL

## 2012-04-13 MED ORDER — SODIUM CHLORIDE 0.9 % IJ SOLN
INTRAMUSCULAR | Status: DC | PRN
  Administered 2012-04-13: 20 mL via INTRAVENOUS

## 2012-04-13 SURGICAL SUPPLY — 58 items
BAG DECANTER FOR FLEXI CONT (MISCELLANEOUS) ×6 IMPLANT
BANDAGE GAUZE ELAST BULKY 4 IN (GAUZE/BANDAGES/DRESSINGS) ×6 IMPLANT
BINDER BREAST XLRG (GAUZE/BANDAGES/DRESSINGS) ×3 IMPLANT
BLADE HEX COATED 2.75 (ELECTRODE) ×3 IMPLANT
BLADE SURG 10 STRL SS (BLADE) ×18 IMPLANT
BLADE SURG 15 STRL LF DISP TIS (BLADE) ×4 IMPLANT
BLADE SURG 15 STRL SS (BLADE) ×2
CANISTER SUCTION 1200CC (MISCELLANEOUS) ×6 IMPLANT
CHLORAPREP W/TINT 26ML (MISCELLANEOUS) ×3 IMPLANT
CLOTH BEACON ORANGE TIMEOUT ST (SAFETY) ×3 IMPLANT
COVER MAYO STAND STRL (DRAPES) ×3 IMPLANT
COVER TABLE BACK 60X90 (DRAPES) ×3 IMPLANT
DERMABOND ADVANCED (GAUZE/BANDAGES/DRESSINGS) ×4
DERMABOND ADVANCED .7 DNX12 (GAUZE/BANDAGES/DRESSINGS) ×8 IMPLANT
DRAIN CHANNEL 10F 3/8 F FF (DRAIN) IMPLANT
DRAPE LAPAROSCOPIC ABDOMINAL (DRAPES) ×3 IMPLANT
DRSG PAD ABDOMINAL 8X10 ST (GAUZE/BANDAGES/DRESSINGS) ×3 IMPLANT
ELECT BLADE 4.0 EZ CLEAN MEGAD (MISCELLANEOUS) ×3
ELECT BLADE 6.5 .24CM SHAFT (ELECTRODE) ×3 IMPLANT
ELECT REM PT RETURN 9FT ADLT (ELECTROSURGICAL) ×6
ELECTRODE BLDE 4.0 EZ CLN MEGD (MISCELLANEOUS) ×2 IMPLANT
ELECTRODE REM PT RTRN 9FT ADLT (ELECTROSURGICAL) ×4 IMPLANT
EVACUATOR SILICONE 100CC (DRAIN) IMPLANT
GAUZE SPONGE 4X4 12PLY STRL LF (GAUZE/BANDAGES/DRESSINGS) ×3 IMPLANT
GLOVE BIO SURGEON STRL SZ 6.5 (GLOVE) ×12 IMPLANT
GLOVE INDICATOR 6.5 STRL GRN (GLOVE) ×3 IMPLANT
GLOVE SKINSENSE NS SZ6.5 (GLOVE) ×1
GLOVE SKINSENSE STRL SZ6.5 (GLOVE) ×2 IMPLANT
GOWN PREVENTION PLUS XLARGE (GOWN DISPOSABLE) ×9 IMPLANT
IMPL GEL SMOOTH RND 700CC (Breast) ×2 IMPLANT
IMPLANT GEL SMOOTH RND 700CC (Breast) ×3 IMPLANT
IV NS 500ML (IV SOLUTION) ×1
IV NS 500ML BAXH (IV SOLUTION) ×2 IMPLANT
KIT FILL SYSTEM UNIVERSAL (SET/KITS/TRAYS/PACK) ×3 IMPLANT
NEEDLE HYPO 25X1 1.5 SAFETY (NEEDLE) ×3 IMPLANT
PACK BASIN DAY SURGERY FS (CUSTOM PROCEDURE TRAY) ×3 IMPLANT
PENCIL BUTTON HOLSTER BLD 10FT (ELECTRODE) ×3 IMPLANT
PIN SAFETY STERILE (MISCELLANEOUS) IMPLANT
SLEEVE SCD COMPRESS KNEE MED (MISCELLANEOUS) ×3 IMPLANT
SPONGE GAUZE 4X4 12PLY (GAUZE/BANDAGES/DRESSINGS) ×3 IMPLANT
SPONGE LAP 18X18 X RAY DECT (DISPOSABLE) ×12 IMPLANT
SUT MNCRL AB 3-0 PS2 18 (SUTURE) IMPLANT
SUT MNCRL AB 4-0 PS2 18 (SUTURE) ×6 IMPLANT
SUT MON AB 5-0 PS2 18 (SUTURE) ×9 IMPLANT
SUT PDS AB 2-0 CT2 27 (SUTURE) ×6 IMPLANT
SUT PDS AB 3-0 PS2 18 (SUTURE) IMPLANT
SUT VIC AB 3-0 FS2 27 (SUTURE) IMPLANT
SUT VIC AB 3-0 SH 27 (SUTURE) ×5
SUT VIC AB 3-0 SH 27X BRD (SUTURE) ×10 IMPLANT
SUT VIC AB 5-0 PS2 18 (SUTURE) ×6 IMPLANT
SUT VICRYL 4-0 PS2 18IN ABS (SUTURE) ×12 IMPLANT
SYR 50ML LL SCALE MARK (SYRINGE) ×3 IMPLANT
SYR BULB IRRIGATION 50ML (SYRINGE) ×3 IMPLANT
SYR CONTROL 10ML LL (SYRINGE) ×3 IMPLANT
TOWEL OR 17X24 6PK STRL BLUE (TOWEL DISPOSABLE) ×6 IMPLANT
TUBE CONNECTING 20X1/4 (TUBING) ×3 IMPLANT
UNDERPAD 30X30 INCONTINENT (UNDERPADS AND DIAPERS) ×6 IMPLANT
YANKAUER SUCT BULB TIP NO VENT (SUCTIONS) ×3 IMPLANT

## 2012-04-13 NOTE — Anesthesia Postprocedure Evaluation (Signed)
  Anesthesia Post-op Note  Patient: [name]  Procedure(s) Performed: Procedure(s) (LRB): BREAST CAPSULECTOMY WITH IMPLANT EXCHANGE (Right) MASTOPEXY (Left)  Patient Location: PACU  Anesthesia Type: General  Level of Consciousness: awake  Airway and Oxygen Therapy: Patient Spontanous Breathing  Post-op Pain: mild  Post-op Assessment: Post-op Vital signs reviewed, Patient's Cardiovascular Status Stable and Respiratory Function Stable  Post-op Vital Signs: stable  Complications: No apparent anesthesia complications

## 2012-04-13 NOTE — Anesthesia Procedure Notes (Signed)
Procedure Name: Intubation Date/Time: 04/13/2012 8:41 AM Performed by: Zenia Resides D Pre-anesthesia Checklist: Patient identified, Emergency Drugs available, Suction available, Patient being monitored and Timeout performed Patient Re-evaluated:Patient Re-evaluated prior to inductionOxygen Delivery Method: Circle System Utilized Preoxygenation: Pre-oxygenation with 100% oxygen Intubation Type: IV induction Ventilation: Mask ventilation without difficulty Laryngoscope Size: Mac and 3 Grade View: Grade II Tube type: Oral Tube size: 7.0 mm Number of attempts: 1 Airway Equipment and Method: stylet and oral airway Placement Confirmation: ETT inserted through vocal cords under direct vision,  positive ETCO2 and breath sounds checked- equal and bilateral Secured at: 23 cm Tube secured with: Tape Dental Injury: Teeth and Oropharynx as per pre-operative assessment

## 2012-04-13 NOTE — H&P (View-Only) (Signed)
History  and Physical  [name]   04/01/2012 9:00 AM Office Visit  MRN: [id number]  Department:  Plastic Surgery  Dept Phone: [phone]  Description: Female DOB: 06/17/1946  Provider: Shawn Montgomery Rayburn, PA-C    Diagnoses    CA - breast cancer   - Primary   174.9      Reason for Visit  -  Breast Reconstruction     Subjective:    Patient ID: [name] is a 66 y.o. female.  HPI [name] is a 66 yrs old wf here for post operative follow up after right mastectomy with SLN and immediate reconstruction with expander/FlexHD placement 06/26/11. 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 No radiation is planned. She has not had any fevers or signs of infection. The expander is in place but deflated again. The incision is healing well and hasnot had any drainage.  The skin flaps are viable with good capillary refill. She denies any leg pain or chest pain. Her hair is growing back and she is wanting to have the second stage reconstructive surgery. We did refill the right expander with 400 cc of sterile saline.  The following portions of the patient's history were reviewed and updated as appropriate: allergies, current medications, past family history, past medical history, past social history, past surgical history and problem list.  Review of Systems  Constitutional: Negative.   HENT: Negative.   Eyes: Negative.   Respiratory: Negative.   Cardiovascular: Negative.   Gastrointestinal: Negative.   Genitourinary: Negative.   Musculoskeletal: Negative.   Neurological: Negative.   Hematological: Negative.   Psychiatric/Behavioral: Negative.    Current outpatient prescriptions:alendronate (FOSAMAX) 70 MG tablet  atorvastatin (LIPITOR) 20 MG tablet, Take 20 mg by mouth daily   DULoxetine (CYMBALTA) 60 MG capsule, Take 60 mg by mouth daily  exemestane (AROMASIN) 25 mg tablet  gabapentin  (NEURONTIN) 600 MG tablet  3 (three) times daily.   hydrochlorothiazide (HYDRODIURIL) 25 MG tablet, Take 25 mg by mouth daily.    LISINOPRIL ORAL, Take by mouth  prochlorperazine (COMPAZINE) 10 MG tablet,    TAZORAC 0.1 % cream TRAMADOL HCL (TRAMADOL ORAL) cephalexin (KEFLEX) 500 MG capsule4 times daily diazepam (VALIUM) 2 MG tablet every 12 (twelve) hours as needed for 10 days for Anxiety     docusate sodium (COLACE) 100 MG capsule 3 times daily.  HYDROcodone-acetaminophen (NORCO) 5-325 mg per tablet every 6 (six) hours as needed for 10 days for Pain ondansetron (ZOFRAN) 8 MG tablet,  promethazine (PHENERGAN) 12.5 MG tablet, Take 1 tablet (12.5 mg total) by mouth every 6 (six) hours as needed for 7 days for Nausea  sulfamethoxazole-trimethoprim (BACTRIM DS) 800-160 mg per tablet,  No Known Allergies  Objective:    Physical Exam  Constitutional: She is oriented to person, place, and time. She appears well-developed and well-nourished. No distress.  HENT:   Head: Normocephalic and atraumatic.   Nose: Nose normal.   Mouth/Throat: Oropharynx is clear and moist.  Eyes: Conjunctivae normal and EOM are normal. Pupils are equal, round, and reactive to light.  Neck: Normal range of motion. Neck supple.  Cardiovascular: Normal rate, regular rhythm and intact distal pulses.  Exam reveals no gallop and no friction rub.    No murmur heard. Pulmonary/Chest: Effort normal and breath sounds normal. No respiratory distress. She exhibits no tenderness.       Right breast incision is well healed    Abdominal: Soft. Bowel sounds are normal.  Musculoskeletal: Normal range of motion.  Neurological: She is alert and oriented to person, place, and time.  Skin: Skin is warm and dry.  Psychiatric: She has a normal mood and affect. Her behavior is normal. Judgment and thought content normal.      Assessment:    Breast Cancer                 Plan:  Will proceed with Right expander removal and silicone  implant placement on right and left breast mastopexy for symmetry.   The risk of surgery including bleeding, pain, risk of anesthesia and capsule contracture were discussed with the patient and she is agreeable to proceed.  Electronically signed by: Shawn Montgomery Rayburn, PA-C 04/01/2012 9:58 AM      

## 2012-04-13 NOTE — Brief Op Note (Signed)
04/13/2012  12:14 PM  PATIENT:  [name]  66 y.o. female  PRE-OPERATIVE DIAGNOSIS:  Acquired absence of right breast secondary to mastectomy for treatment of breast cancer, left mammary hypertrophy  POST-OPERATIVE DIAGNOSIS: Acquired absence of right breast secondary to mastectomy for treatment of breast cancer, left mammary hypertrophy  PROCEDURE:  Procedure(s) (LRB): RIGHT BREAST CAPSULECTOMY, EXPANDER REMOVAL AND PLACEMENT OF IMPLANT, LEFT MASTOPEXY/REDUCTION 280 gm  SURGEON:  Surgeon(s) and Role:    * Sireen Halk Sanger, DO - Primary  PHYSICIAN ASSISTANT:   ASSISTANTS: Harrie Foreman, LPN   ANESTHESIA:   local and general  EBL:  Total I/O In: 1500 [I.V.:1500] Out: -   BLOOD ADMINISTERED:none  DRAINS: none   LOCAL MEDICATIONS USED:  MARCAINE     SPECIMEN:  Source of Specimen:  right mastectomy scar, right breast capsule, left breast tissue  DISPOSITION OF SPECIMEN:  PATHOLOGY  COUNTS:  YES  TOURNIQUET:  * No tourniquets in log *  DICTATION: dictated  PLAN OF CARE: Discharge to home after PACU  PATIENT DISPOSITION:  PACU - hemodynamically stable.   Delay start of Pharmacological VTE agent (>24hrs) due to surgical blood loss or risk of bleeding: no

## 2012-04-13 NOTE — Anesthesia Preprocedure Evaluation (Signed)
Anesthesia Evaluation  Patient identified by MRN, date of birth, ID band Patient awake    Reviewed: Allergy & Precautions, H&P , NPO status , Patient's Chart, lab work & pertinent test results  History of Anesthesia Complications (+) PONV  Airway Mallampati: II TM Distance: >3 FB Neck ROM: Full    Dental   Pulmonary asthma ,    Pulmonary exam normal       Cardiovascular hypertension,     Neuro/Psych  Neuromuscular disease    GI/Hepatic   Endo/Other  Morbid obesity  Renal/GU      Musculoskeletal   Abdominal (+) + obese,   Peds  Hematology   Anesthesia Other Findings   Reproductive/Obstetrics                           Anesthesia Physical Anesthesia Plan  ASA: III  Anesthesia Plan: General   Post-op Pain Management:    Induction: Intravenous  Airway Management Planned: Oral ETT  Additional Equipment:   Intra-op Plan:   Post-operative Plan: Extubation in OR  Informed Consent: I have reviewed the patients History and Physical, chart, labs and discussed the procedure including the risks, benefits and alternatives for the proposed anesthesia with the patient or authorized representative who has indicated his/her understanding and acceptance.     Plan Discussed with: CRNA and Surgeon  Anesthesia Plan Comments:         Anesthesia Quick Evaluation

## 2012-04-13 NOTE — Interval H&P Note (Signed)
History and Physical Interval Note:  04/13/2012 8:14 AM  [name]  has presented today for surgery, with the diagnosis of breast cancer  The various methods of treatment have been discussed with the patient and family. After consideration of risks, benefits and other options for treatment, the patient has consented to  Procedure(s) (LRB): BREAST CAPSULECTOMY WITH IMPLANT EXCHANGE (Right) MASTOPEXY (Left) as a surgical intervention .  The patient's history has been reviewed, patient examined, no change in status, stable for surgery.  I have reviewed the patient's chart and labs.  Questions were answered to the patient's satisfaction.     SANGER,CLAIRE

## 2012-04-13 NOTE — Transfer of Care (Signed)
Immediate Anesthesia Transfer of Care Note  Patient: [name]  Procedure(s) Performed: Procedure(s) (LRB): BREAST CAPSULECTOMY WITH IMPLANT EXCHANGE (Right) MASTOPEXY (Left)  Patient Location: PACU  Anesthesia Type: General  Level of Consciousness: awake, alert  and oriented  Airway & Oxygen Therapy: Patient Spontanous Breathing and Patient connected to face mask oxygen  Post-op Assessment: Report given to PACU RN and Post -op Vital signs reviewed and stable  Post vital signs: Reviewed and stable  Complications: No apparent anesthesia complications

## 2012-04-14 ENCOUNTER — Encounter (HOSPITAL_BASED_OUTPATIENT_CLINIC_OR_DEPARTMENT_OTHER): Payer: Self-pay | Admitting: Plastic Surgery

## 2012-04-14 NOTE — Op Note (Signed)
[name], [name] NO.:  [id number]  MEDICAL RECORD NO.:  [id number]  LOCATION:[facility]       [facility]  PHYSICIAN:  Wayland Denis, DO      DATE OF BIRTH:  09/03/1946  DATE OF PROCEDURE:  04/13/2012 DATE OF DISCHARGE:                              OPERATIVE REPORT   PREOPERATIVE DIAGNOSIS:  Acquired absence of right breast secondary to mastectomy for treatment of breast cancer and left mammary hypertrophy.  POSTOPERATIVE DIAGNOSIS:  Acquired absence of right breast secondary to mastectomy for treatment of breast cancer and left mammary hypertrophy.  PROCEDURE:  Removal of the right expander capsulectomy and placement of implant, and left mastopexy reduction.  ATTENDING SURGEON:  Wayland Denis, DO.  ASSISTANT:  Marcello Moores, LPN  ANESTHESIA:  General.  INDICATION FOR PROCEDURE:  The patient is a 66 year old female who underwent a right mastectomy for treatment of breast cancer and reconstruction with expander and Flex HD.  She presents for secondary procedure with removal of the expander and placement of an implant. Risks and complications were reviewed and included bleeding, pain, scar, risk of anesthesia, capsular contracture, as well as the complications for left mastopexy reduction including infection.  Consent was signed and confirmed and the patient wished to proceed.  DESCRIPTION OF PROCEDURE:  The patient was taken to the operating room, placed on the operating room table in a supine position.  General anesthesia was administered.  Once adequate, a time-out was called.  All information was confirmed to be correct.  A fill kit was set up and 500 mL of injectable saline was placed in the right expander since it had deflated.  The patient was then prepped and draped in usual sterile fashion.  Time-out was called.  All information was confirmed to be correct.  An elliptical incision was made around the right breast  scar in order to incorporate some of the scar tissue that was slightly hypertrophied that was sent to Pathology.  Bovie was then used to dissect down to the muscle.  There was an additional place of fibrous tissue above the muscle that was sent to Pathology as well.  The expander was located and the fluid was evacuated.  The expander was then removed and the pocket was irrigated with antibiotic solution.  Bovie was used to free the capsule in all directions.  The lateral capsule was excised and sent to Pathology.  The lateral tissue was then reapproximated more medially to help prevent the expander from rotating laterally.  There was a portion of the Flex HD that had not incorporated and this was excised as well.  Hemostasis was achieved with electrocautery.  The Mentor smooth, round, high-profile gel implant 700 mL was selected, prepared according to the manufacturer guidelines and placed in the pocket.  The deep layers were then closed with 3-0 Vicryl followed by 4-0 Vicryl and 5-0 Monocryl to reapproximate the skin edges. Dermabond was then applied.  Attention was then turned to the left breast.  0.25% bupivacaine and 50 mL of injectable saline was prepared and injected into the left breast.  After waiting several minutes for the epinephrine to take effect, the markings were confirmed for an inferior pedicle mastopexy reduction.  The cookie cutter was used to mark the areola.  The 10 blade was used to de-epithelialize the inferior pedicle and free the areola.  The Bovie was then used to dissect down to the chest wall creating medial and lateral flaps.  The wedges were then excised and sent for weight.  The total weight was 280 g.  The pocket was irrigated with antibiotic solution and hemostasis was achieved with electrocautery.  The superior flap was lifted to just about the clavicular level.  The vertical limbs were then brought together with 3- 0 Vicryl.  The inframammary fold  incision at the horizontal level was reapproximated with 3-0 Vicryl, 4-0 Vicryl was then used to close the next layer, and 5-0 Monocryl  to reapproximate the skin edges.  The patient was marked for the nipple areolar complex location, which was 6 cm above the inframammary fold.  The cookie cutter was used to place the markings and the skin was de-epithelialized and the nipple areola was brought up and tacked into place with 5-0 Vicryl followed by 5-0 Monocryl running subcuticular sutures.  Dermabond was then applied.  The patient tolerated the procedure well.  There were no complications.  She was awoken and taken to recovery room in stable condition after ABDs and a breast binder was placed.     Wayland Denis, DO     CS/MEDQ  D:  04/13/2012  T:  04/14/2012  Job:  [id number]

## 2012-04-15 ENCOUNTER — Other Ambulatory Visit: Payer: BC Managed Care – PPO | Admitting: Lab

## 2012-04-15 ENCOUNTER — Ambulatory Visit: Payer: BC Managed Care – PPO | Admitting: Oncology

## 2012-04-15 ENCOUNTER — Ambulatory Visit: Payer: BC Managed Care – PPO

## 2012-04-18 ENCOUNTER — Encounter (HOSPITAL_BASED_OUTPATIENT_CLINIC_OR_DEPARTMENT_OTHER): Payer: Self-pay

## 2012-04-29 ENCOUNTER — Ambulatory Visit: Payer: BC Managed Care – PPO | Admitting: Oncology

## 2012-04-29 ENCOUNTER — Other Ambulatory Visit: Payer: BC Managed Care – PPO | Admitting: Lab

## 2012-05-06 ENCOUNTER — Encounter: Payer: Self-pay | Admitting: Oncology

## 2012-05-06 ENCOUNTER — Ambulatory Visit: Payer: BC Managed Care – PPO

## 2012-05-06 ENCOUNTER — Other Ambulatory Visit (HOSPITAL_BASED_OUTPATIENT_CLINIC_OR_DEPARTMENT_OTHER): Payer: BC Managed Care – PPO | Admitting: Lab

## 2012-05-06 ENCOUNTER — Ambulatory Visit (HOSPITAL_BASED_OUTPATIENT_CLINIC_OR_DEPARTMENT_OTHER): Payer: BC Managed Care – PPO | Admitting: Oncology

## 2012-05-06 ENCOUNTER — Ambulatory Visit (HOSPITAL_BASED_OUTPATIENT_CLINIC_OR_DEPARTMENT_OTHER): Payer: BC Managed Care – PPO

## 2012-05-06 VITALS — BP 118/70 | HR 80 | Temp 98.3°F | Resp 20 | Ht 63.5 in | Wt 231.2 lb

## 2012-05-06 DIAGNOSIS — Z170 Estrogen receptor positive status [ER+]: Secondary | ICD-10-CM | POA: Diagnosis not present

## 2012-05-06 DIAGNOSIS — Z5112 Encounter for antineoplastic immunotherapy: Secondary | ICD-10-CM

## 2012-05-06 DIAGNOSIS — IMO0001 Reserved for inherently not codable concepts without codable children: Secondary | ICD-10-CM

## 2012-05-06 DIAGNOSIS — C50919 Malignant neoplasm of unspecified site of unspecified female breast: Secondary | ICD-10-CM

## 2012-05-06 LAB — CBC WITH DIFFERENTIAL/PLATELET
Basophils Absolute: 0 10*3/uL (ref 0.0–0.1)
EOS%: 5.8 % (ref 0.0–7.0)
MCH: 29.4 pg (ref 25.1–34.0)
MCV: 88.3 fL (ref 79.5–101.0)
MONO%: 8.8 % (ref 0.0–14.0)
RBC: 4.11 10*6/uL (ref 3.70–5.45)
RDW: 14.3 % (ref 11.2–14.5)
nRBC: 0 % (ref 0–0)

## 2012-05-06 LAB — COMPREHENSIVE METABOLIC PANEL
Alkaline Phosphatase: 79 U/L (ref 39–117)
Glucose, Bld: 90 mg/dL (ref 70–99)
Sodium: 136 mEq/L (ref 135–145)
Total Bilirubin: 0.4 mg/dL (ref 0.3–1.2)
Total Protein: 5.9 g/dL — ABNORMAL LOW (ref 6.0–8.3)

## 2012-05-06 MED ORDER — TRASTUZUMAB CHEMO INJECTION 440 MG
6.0000 mg/kg | Freq: Once | INTRAVENOUS | Status: AC
  Administered 2012-05-06: 609 mg via INTRAVENOUS
  Filled 2012-05-06: qty 29

## 2012-05-06 MED ORDER — HEPARIN SOD (PORK) LOCK FLUSH 100 UNIT/ML IV SOLN
500.0000 [IU] | Freq: Once | INTRAVENOUS | Status: AC | PRN
  Administered 2012-05-06: 500 [IU]
  Filled 2012-05-06: qty 5

## 2012-05-06 MED ORDER — ACETAMINOPHEN 325 MG PO TABS
650.0000 mg | ORAL_TABLET | Freq: Once | ORAL | Status: AC
  Administered 2012-05-06: 650 mg via ORAL

## 2012-05-06 MED ORDER — SODIUM CHLORIDE 0.9 % IV SOLN
Freq: Once | INTRAVENOUS | Status: AC
  Administered 2012-05-06: 16:00:00 via INTRAVENOUS

## 2012-05-06 MED ORDER — SODIUM CHLORIDE 0.9 % IJ SOLN
10.0000 mL | INTRAMUSCULAR | Status: DC | PRN
  Administered 2012-05-06: 10 mL
  Filled 2012-05-06: qty 10

## 2012-05-06 NOTE — Progress Notes (Signed)
OFFICE PROGRESS NOTE  CC [name] M.D. [name] M.D.  [facility], [place] [address] [place] [address]  DIAGNOSIS: 66 year old female with multicentric right breast cancer that was ER positive PR positive HER-2/neu positive clinical stage I.  PRIOR THERAPY:  #1 status post right simple mastectomy with right axillary sentinel node biopsy and left subclavian power port insertion on 06/26/2011.  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 Sentinel node biopsy done at the time of her mastectomy was negative for metastatic disease. Pathologic stage TI C. N0 M0, stage I breast cancer.  #3 patient has been receiving weekly Taxotere carboplatinum and Herceptin beginning November 2012. However she has had considerable side effects requiring discontinuation/holding of the chemotherapy.  #4 S/P Taxotere/Crboplatin q 3 weeks with weekly Herceptin beginning 09/30/11 x 3 cycles completed on 11/11/11  #5. Herceptin q 21 days begin 12/02/11  #6 adjuvant Aromasin 25 mg daily starting 02/05/2012.  CURRENT THERAPY: Herceptin q 21 days starting 12/02/11 and aromasin 25 mg daily beginning 02/05/12  INTERVAL HISTORY: [name] 66 y.o. female followup visit today for her Scheduled Herceptin today. She is on Aromasin as well she does have complaints of myalgias and arthralgias slightly more than previously. She is taking tramadol for this.But in spite of the tramadol she is having significant pains that are interfering with her functioning of daily living. 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 She otherwise has no nausea or vomiting no peripheral paresthesias. She is having difficulty sleeping at night do to be aches and pains she is also having difficulty ambulating. She has no headaches or double vision no breast tenderness. Remainder of the  review of systems is negative.   MEDICAL HISTORY: Past Medical History  Diagnosis Date  . Allergy   . Asthma   . Hypertension   . Hyperlipidemia   . Depression   . Fibromyalgia   . Arthritis     feet, knee and base of thumbs  . Osteopenia   . Carpal tunnel syndrome     right  . Breast cancer, stage 1 07/18/2011  . Dehydration 10/21/2011  . PONV (postoperative nausea and vomiting)     has had to use scop patch    ALLERGIES:   has no known allergies.  MEDICATIONS:  Current Outpatient Prescriptions  Medication Sig Dispense Refill  . alendronate (FOSAMAX) 70 MG tablet Take 70 mg by mouth every 7 (seven) days. Take with a full glass of water on an empty stomach.      Marland Kitchen atorvastatin (LIPITOR) 20 MG tablet Take 20 mg by mouth daily.        . calcium gluconate 500 MG tablet Take 500 mg by mouth daily.        . cholecalciferol (VITAMIN D) 1000 UNITS tablet Take 2,000 Units by mouth daily.        . DULoxetine (CYMBALTA) 60 MG capsule Take 120 mg by mouth daily.       Marland Kitchen exemestane (AROMASIN) 25 MG tablet Take 1 tablet by mouth daily.      Marland Kitchen gabapentin (NEURONTIN) 600 MG tablet Take 600 mg by mouth daily.       . hydrochlorothiazide (HYDRODIURIL) 25 MG tablet Take 25 mg by mouth daily.      Boris Lown Oil 300 MG CAPS Take 1 capsule by mouth daily.        Marland Kitchen  lidocaine-prilocaine (EMLA) cream Apply 1 application topically as needed.        Marland Kitchen lisinopril (PRINIVIL,ZESTRIL) 10 MG tablet Take 10 mg by mouth daily.      Marland Kitchen loratadine (CLARITIN) 10 MG tablet Take 10 mg by mouth daily.        . magnesium 30 MG tablet Take 500 mg by mouth daily.       . Multiple Vitamin (MULTIVITAMIN) capsule Take 1 capsule by mouth daily.        Marland Kitchen sulfamethoxazole-trimethoprim (BACTRIM DS) 800-160 MG per tablet       . TAZORAC 0.1 % cream       . traMADol (ULTRAM) 50 MG tablet Take 1 tablet (50 mg total) by mouth as needed for pain (PRN for osteoarthritis and fibromyalgia pain. ).  90 tablet  0  . DISCONTD:  diphenhydrAMINE (SOMINEX) 25 MG tablet Take 25 mg by mouth at bedtime as needed.          SURGICAL HISTORY:  Past Surgical History  Procedure Date  . Tonsillectomy 1950  . Tubal ligation 1978  . Abdominal hysterectomy 1981  . Right ear surgery 1983    tumor removed (?)  . Right breast lumpectomy 1986  . Ethmoidectomy 1991  . Knee arthroscopy 1996, 2001    left  . Facial plastic surgery 1996  . Bunionectomy 1998    left foot  . Total knee arthroplasty 2006    left  . Nasal sinus surgery 2001  . Lap band surgery 2010  . Right mastectomy 06-26-11  . Port-a-cath placement 06/26/11    tip in lower SVC per chest x-ray read by Dr. Dwyane Dee  . Mastopexy 04/13/2012    Procedure: MASTOPEXY;  Surgeon: Wayland Denis, DO;  Location: [facility];  Service: Plastics;  Laterality: Left;   left breast  mastopexy reduction for symmetry    REVIEW OF SYSTEMS:  A comprehensive review of systems was negative.   PHYSICAL EXAMINATION: General appearance: alert, cooperative and no distress Neck: no adenopathy, no carotid bruit, no JVD, supple, symmetrical, trachea midline and thyroid not enlarged, symmetric, no tenderness/mass/nodules Lymph nodes: Cervical, supraclavicular, and axillary nodes normal. Resp: clear to auscultation bilaterally and normal percussion bilaterally Back: symmetric, no curvature. ROM normal. No CVA tenderness. Cardio: regular rate and rhythm, S1, S2 normal, no murmur, click, rub or gallop GI: soft, non-tender; bowel sounds normal; no masses,  no organomegaly Extremities: extremities normal, atraumatic, no cyanosis or edema Neurologic: Grossly normal Left breast no masses nipple discharge no skin changes. Right mastectomy scar is healing well. There still is some scar tissue formation and some scabbing. But there is no signs of infection para  ECOG PERFORMANCE STATUS: 0 - Asymptomatic  Blood pressure 118/70, pulse 80, temperature 98.3 F (36.8 C), temperature  source Oral, resp. rate 20, height 5' 3.5" (1.613 m), weight 231 lb 3.2 oz (104.872 kg).  LABORATORY DATA: Lab Results  Component Value Date   WBC 7.6 05/06/2012   HGB 12.1 05/06/2012   HCT 36.3 05/06/2012   MCV 88.3 05/06/2012   PLT 210 05/06/2012      Chemistry      Component Value Date/Time   NA 136 04/12/2012 1335   K 3.9 04/12/2012 1335   CL 98 04/12/2012 1335   CO2 26 04/12/2012 1335   BUN 19 04/12/2012 1335   CREATININE 0.79 04/12/2012 1335      Component Value Date/Time   CALCIUM 9.8 04/12/2012 1335   ALKPHOS 76 12/23/2011  0935   AST 31 12/23/2011 0935   ALT 22 12/23/2011 0935   BILITOT 0.7 12/23/2011 0935       RADIOGRAPHIC STUDIES:  No results found.  ASSESSMENT: 66 year old female with   #1 stage I HER-2/neu positive breast cancer. Her final stage was 1.9 cm high-grade poorly differentiated invasive ductal carcinoma no LVI ER positive PR positive HER-2/neu positive. She underwent a mastectomy. No sentinel nodes were involved with disease.   #2. Completed chemotherapy but continue Herceptin q 3 weeks to complete out one year of therapy. All of the Herceptin will be completed towards the end of November 2013.  #3 having significant problems with aches and pains most likely due to the Aromasin. I have recommended that she discontinue this at least for now.  PLAN:   #1 patient will proceed with her Herceptin today. Patient with significant myalgias or arthralgias secondary to the Aromasin and underlying arthritis. I do think that the Aromasin has made it significantly worse and therefore we will hold the Aromasin to see if she gets any relief.  #2. Return in 3 weeks for followup.  I spent 25 minutes counseling the patient face to face. The total time spent in the appointment was 30 minutes.    Drue Second, MD Medical/Oncology [facility] [phone] (beeper) [phone] (Office)  05/06/2012, 2:18 PM

## 2012-05-06 NOTE — Patient Instructions (Signed)
[facility] Discharge Instructions for Patients Receiving Chemotherapy  Today you received the following chemotherapy agents:  Herceptin To help prevent nausea and vomiting after your treatment, we encourage you to take your nausea medication    If you develop nausea and vomiting that is not controlled by your nausea medication, call the clinic. If it is after clinic hours your family physician or the after hours number for the clinic or go to the Emergency Department.   BELOW ARE SYMPTOMS THAT SHOULD BE REPORTED IMMEDIATELY:  *FEVER GREATER THAN 100.5 F  *CHILLS WITH OR WITHOUT FEVER  NAUSEA AND VOMITING THAT IS NOT CONTROLLED WITH YOUR NAUSEA MEDICATION  *UNUSUAL SHORTNESS OF BREATH  *UNUSUAL BRUISING OR BLEEDING  TENDERNESS IN MOUTH AND THROAT WITH OR WITHOUT PRESENCE OF ULCERS  *URINARY PROBLEMS  *BOWEL PROBLEMS  UNUSUAL RASH Items with * indicate a potential emergency and should be followed up as soon as possible.   Please let the nurse know about any problems that you may have experienced. Feel free to call the clinic you have any questions or concerns. The clinic phone number is [phone].   I have been informed and understand all the instructions given to me. I know to contact the clinic, my physician, or go to the Emergency Department if any problems should occur. I do not have any questions at this time, but understand that I may call the clinic during office hours   should I have any questions or need assistance in obtaining follow up care.    __________________________________________  _____________  __________ Signature of Patient or Authorized Representative            Date                   Time    __________________________________________ Nurse's Signature    

## 2012-05-06 NOTE — Patient Instructions (Addendum)
Proceed with herceptin today  Hold aromasin due to aches and pains  I will see you back in 3 weeks

## 2012-05-27 ENCOUNTER — Ambulatory Visit (HOSPITAL_BASED_OUTPATIENT_CLINIC_OR_DEPARTMENT_OTHER): Payer: BC Managed Care – PPO

## 2012-05-27 ENCOUNTER — Other Ambulatory Visit (HOSPITAL_BASED_OUTPATIENT_CLINIC_OR_DEPARTMENT_OTHER): Payer: BC Managed Care – PPO

## 2012-05-27 ENCOUNTER — Ambulatory Visit: Payer: BC Managed Care – PPO | Admitting: Oncology

## 2012-05-27 ENCOUNTER — Encounter: Payer: Self-pay | Admitting: Oncology

## 2012-05-27 VITALS — BP 144/83 | HR 44 | Temp 97.9°F | Resp 20 | Ht 63.5 in | Wt 228.4 lb

## 2012-05-27 VITALS — BP 130/82 | HR 70 | Temp 98.0°F | Resp 20

## 2012-05-27 DIAGNOSIS — Z5112 Encounter for antineoplastic immunotherapy: Secondary | ICD-10-CM | POA: Diagnosis not present

## 2012-05-27 DIAGNOSIS — C50919 Malignant neoplasm of unspecified site of unspecified female breast: Secondary | ICD-10-CM

## 2012-05-27 DIAGNOSIS — Z09 Encounter for follow-up examination after completed treatment for conditions other than malignant neoplasm: Secondary | ICD-10-CM

## 2012-05-27 DIAGNOSIS — Z170 Estrogen receptor positive status [ER+]: Secondary | ICD-10-CM | POA: Diagnosis not present

## 2012-05-27 DIAGNOSIS — IMO0001 Reserved for inherently not codable concepts without codable children: Secondary | ICD-10-CM | POA: Diagnosis not present

## 2012-05-27 LAB — CBC WITH DIFFERENTIAL/PLATELET
Basophils Absolute: 0 10*3/uL (ref 0.0–0.1)
Eosinophils Absolute: 0.1 10*3/uL (ref 0.0–0.5)
HCT: 42.1 % (ref 34.8–46.6)
LYMPH%: 33 % (ref 14.0–49.7)
MCV: 88.6 fL (ref 79.5–101.0)
MONO%: 8.9 % (ref 0.0–14.0)
NEUT#: 4.2 10*3/uL (ref 1.5–6.5)
NEUT%: 55.7 % (ref 38.4–76.8)
Platelets: 228 10*3/uL (ref 145–400)
RBC: 4.75 10*6/uL (ref 3.70–5.45)
nRBC: 0 % (ref 0–0)

## 2012-05-27 LAB — BASIC METABOLIC PANEL (CC13)
BUN: 21 mg/dL (ref 7.0–26.0)
CO2: 22 mEq/L (ref 22–29)
Calcium: 9.1 mg/dL (ref 8.4–10.4)
Creatinine: 0.8 mg/dL (ref 0.6–1.1)
Glucose: 94 mg/dl (ref 70–99)

## 2012-05-27 MED ORDER — DIPHENHYDRAMINE HCL 25 MG PO CAPS: 50.0000 mg | ORAL_CAPSULE | Freq: Once | ORAL | Status: DC

## 2012-05-27 MED ORDER — HEPARIN SOD (PORK) LOCK FLUSH 100 UNIT/ML IV SOLN
500.0000 [IU] | Freq: Once | INTRAVENOUS | Status: AC | PRN
  Administered 2012-05-27: 500 [IU]
  Filled 2012-05-27: qty 5

## 2012-05-27 MED ORDER — TRASTUZUMAB CHEMO INJECTION 440 MG
6.0000 mg/kg | Freq: Once | INTRAVENOUS | Status: AC
  Administered 2012-05-27: 609 mg via INTRAVENOUS
  Filled 2012-05-27: qty 29

## 2012-05-27 MED ORDER — SODIUM CHLORIDE 0.9 % IV SOLN
Freq: Once | INTRAVENOUS | Status: AC
  Administered 2012-05-27: 15:00:00 via INTRAVENOUS

## 2012-05-27 MED ORDER — TAMOXIFEN CITRATE 20 MG PO TABS: 20.0000 mg | ORAL_TABLET | Freq: Every day | ORAL | Status: AC

## 2012-05-27 MED ORDER — ACETAMINOPHEN 325 MG PO TABS
650.0000 mg | ORAL_TABLET | Freq: Once | ORAL | Status: AC
  Administered 2012-05-27: 650 mg via ORAL

## 2012-05-27 MED ORDER — SODIUM CHLORIDE 0.9 % IJ SOLN
10.0000 mL | INTRAMUSCULAR | Status: DC | PRN
  Administered 2012-05-27: 10 mL
  Filled 2012-05-27: qty 10

## 2012-05-27 NOTE — Patient Instructions (Signed)
Patient aware of next appointment; discharged home with no complaints. 

## 2012-05-27 NOTE — Patient Instructions (Addendum)
Proceed with tamoxifen 20 mg daily  I will see you back in 3 weeks  Tamoxifen oral tablet What is this medicine? TAMOXIFEN (ta MOX i fen) blocks the effects of estrogen. It is commonly used to treat breast cancer. It is also used to decrease the chance of breast cancer coming back in women who have received treatment for the disease. It may also help prevent breast cancer in women who have a high risk of developing breast cancer. This medicine may be used for other purposes; ask your health care provider or pharmacist if you have questions. What should I tell my health care provider before I take this medicine? They need to know if you have any of these conditions: -blood clots -blood disease -cataracts or impaired eyesight -endometriosis -high calcium levels -high cholesterol -irregular menstrual cycles -liver disease -stroke -uterine fibroids -an unusual or allergic reaction to tamoxifen, other medicines, foods, dyes, or preservatives -pregnant or trying to get pregnant -breast-feeding How should I use this medicine? Take this medicine by mouth with a glass of water. Follow the directions on the prescription label. You can take it with or without food. Take your medicine at regular intervals. Do not take your medicine more often than directed. Do not stop taking except on your doctor's advice. A special MedGuide will be given to you by the pharmacist with each prescription and refill. Be sure to read this information carefully each time. Talk to your pediatrician regarding the use of this medicine in children. While this drug may be prescribed for selected conditions, precautions do apply. Overdosage: If you think you have taken too much of this medicine contact a poison control center or emergency room at once. NOTE: This medicine is only for you. Do not share this medicine with others. What if I miss a dose? If you miss a dose, take it as soon as you can. If it is almost time for  your next dose, take only that dose. Do not take double or extra doses. What may interact with this medicine? -aminoglutethimide -bromocriptine -chemotherapy drugs -female hormones, like estrogens and birth control pills -letrozole -medroxyprogesterone -phenobarbital -rifampin -warfarin This list may not describe all possible interactions. Give your health care provider a list of all the medicines, herbs, non-prescription drugs, or dietary supplements you use. Also tell them if you smoke, drink alcohol, or use illegal drugs. Some items may interact with your medicine. What should I watch for while using this medicine? Visit your doctor or health care professional for regular checks on your progress. You will need regular pelvic exams, breast exams, and mammograms. If you are taking this medicine to reduce your risk of getting breast cancer, you should know that this medicine does not prevent all types of breast cancer. If breast cancer or other problems occur, there is no guarantee that it will be found at an early stage. Do not become pregnant while taking this medicine or for 2 months after stopping this medicine. Stop taking this medicine if you get pregnant or think you are pregnant and contact your doctor. This medicine may harm your unborn baby. Women who can possibly become pregnant should use birth control methods that do not use hormones during tamoxifen treatment and for 2 months after therapy has stopped. Talk with your health care provider for birth control advice. Do not breast feed while taking this medicine. What side effects may I notice from receiving this medicine? Side effects that you should report to your doctor or health  care professional as soon as possible: -changes in vision (blurred vision) -changes in your menstrual cycle -difficulty breathing or shortness of breath -difficulty walking or talking -new breast lumps -numbness -pelvic pain or pressure -redness,  blistering, peeling or loosening of the skin, including inside the mouth -skin rash or itching (hives) -sudden chest pain -swelling of lips, face, or tongue -swelling, pain or tenderness in your calf or leg -unusual bruising or bleeding -vaginal discharge that is bloody, brown, or rust -weakness -yellowing of the whites of the eyes or skin Side effects that usually do not require medical attention (report to your doctor or health care professional if they continue or are bothersome): -fatigue -hair loss, although uncommon and is usually mild -headache -hot flashes -impotence (in men) -nausea, vomiting (mild) -vaginal discharge (white or clear) This list may not describe all possible side effects. Call your doctor for medical advice about side effects. You may report side effects to FDA at 1-800-FDA-1088. Where should I keep my medicine? Keep out of the reach of children. Store at room temperature between 20 and 25 degrees C (68 and 77 degrees F). Protect from light. Keep container tightly closed. Throw away any unused medicine after the expiration date. NOTE: This sheet is a summary. It may not cover all possible information. If you have questions about this medicine, talk to your doctor, pharmacist, or health care provider.  2012, [employer]. (05/17/2008 12:01:56 PM)

## 2012-05-27 NOTE — Progress Notes (Signed)
OFFICE PROGRESS NOTE  CC [name] M.D. [name] M.D.  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  PRIOR THERAPY:  #1 status post right simple mastectomy with right axillary sentinel node biopsy and left subclavian power port insertion on 06/26/2011.  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 Sentinel node biopsy done at the time of her mastectomy was negative for metastatic disease. Pathologic stage TI C. N0 M0, stage I breast cancer.  #3 patient has been receiving weekly Taxotere carboplatinum and Herceptin beginning November 2012. However she has had considerable side effects requiring discontinuation/holding of the chemotherapy.  #4 S/P Taxotere/Crboplatin q 3 weeks with weekly Herceptin beginning 09/30/11 x 3 cycles completed on 11/11/11  #5. Herceptin q 21 days begin 12/02/11  #6 adjuvant Aromasin 25 mg daily starting 02/05/2012.this was discontinued September 2013 do to incapacitating myalgias and arthralgias.  #7 tamoxifen 20 mg daily begun 05/27/2012.  CURRENT THERAPY: Herceptin q 21 days starting 12/02/11 , tamoxifen 20 mg daily starting 05/27/2012  INTERVAL HISTORY: [name] 66 y.o. female followup visit today for her Scheduled Herceptin today. Patient has been off of Aromasin for about 3 weeks. Since coming off of the Aromasin she feels much better. She has less achiness she has no difficulty in ambulation. She does tell me that she seems to be almost back to her normal self prior to being on the Aromasin. She has no fevers chills night sweats no chest pains no shortness of breath. Remainder of the 10 point review of systems is negative. MEDICAL HISTORY: Past Medical History  Diagnosis Date  . Allergy    . Asthma   . Hypertension   . Hyperlipidemia   . Depression   . Fibromyalgia   . Arthritis     feet, knee and base of thumbs  . Osteopenia   . Carpal tunnel syndrome     right  . Breast cancer, stage 1 07/18/2011  . Dehydration 10/21/2011  . PONV (postoperative nausea and vomiting)     has had to use scop patch    ALLERGIES:   has no known allergies.  MEDICATIONS:  Current Outpatient Prescriptions  Medication Sig Dispense Refill  . alendronate (FOSAMAX) 70 MG tablet Take 70 mg by mouth every 7 (seven) days. Take with a full glass of water on an empty stomach.      Marland Kitchen atorvastatin (LIPITOR) 20 MG tablet Take 20 mg by mouth daily.        . calcium gluconate 500 MG tablet Take 500 mg by mouth daily.        . cholecalciferol (VITAMIN D) 1000 UNITS tablet Take 2,000 Units by mouth daily.        . DULoxetine (CYMBALTA) 60 MG capsule Take 120 mg by mouth daily.       Marland Kitchen gabapentin (NEURONTIN) 600 MG tablet Take 600 mg by mouth daily.       . hydrochlorothiazide (HYDRODIURIL) 25 MG tablet Take 25 mg by mouth daily.      Boris Lown Oil 300 MG CAPS Take 1 capsule by mouth daily.        Marland Kitchen lidocaine-prilocaine (EMLA) cream Apply 1 application topically as needed.        Marland Kitchen lisinopril (PRINIVIL,ZESTRIL) 10 MG tablet Take 10 mg by mouth  daily.      . loratadine (CLARITIN) 10 MG tablet Take 10 mg by mouth daily.        . magnesium 30 MG tablet Take 500 mg by mouth daily.       . Multiple Vitamin (MULTIVITAMIN) capsule Take 1 capsule by mouth daily.        Marland Kitchen sulfamethoxazole-trimethoprim (BACTRIM DS) 800-160 MG per tablet       . tamoxifen (NOLVADEX) 20 MG tablet Take 1 tablet (20 mg total) by mouth daily.  90 tablet  12  . TAZORAC 0.1 % cream       . traMADol (ULTRAM) 50 MG tablet Take 1 tablet (50 mg total) by mouth as needed for pain (PRN for osteoarthritis and fibromyalgia pain. ).  90 tablet  0  . DISCONTD: diphenhydrAMINE (SOMINEX) 25 MG tablet Take 25 mg by mouth at bedtime as needed.           SURGICAL HISTORY:  Past Surgical History  Procedure Date  . Tonsillectomy 1950  . Tubal ligation 1978  . Abdominal hysterectomy 1981  . Right ear surgery 1983    tumor removed (?)  . Right breast lumpectomy 1986  . Ethmoidectomy 1991  . Knee arthroscopy 1996, 2001    left  . Facial plastic surgery 1996  . Bunionectomy 1998    left foot  . Total knee arthroplasty 2006    left  . Nasal sinus surgery 2001  . Lap band surgery 2010  . Right mastectomy 06-26-11  . Port-a-cath placement 06/26/11    tip in lower SVC per chest x-ray read by Dr. Dwyane Dee  . Mastopexy 04/13/2012    Procedure: MASTOPEXY;  Surgeon: Wayland Denis, DO;  Location: [facility];  Service: Plastics;  Laterality: Left;   left breast  mastopexy reduction for symmetry    REVIEW OF SYSTEMS:  A comprehensive review of systems was negative.   PHYSICAL EXAMINATION: General appearance: alert, cooperative and no distress Neck: no adenopathy, no carotid bruit, no JVD, supple, symmetrical, trachea midline and thyroid not enlarged, symmetric, no tenderness/mass/nodules Lymph nodes: Cervical, supraclavicular, and axillary nodes normal. Resp: clear to auscultation bilaterally and normal percussion bilaterally Back: symmetric, no curvature. ROM normal. No CVA tenderness. Cardio: regular rate and rhythm, S1, S2 normal, no murmur, click, rub or gallop GI: soft, non-tender; bowel sounds normal; no masses,  no organomegaly Extremities: extremities normal, atraumatic, no cyanosis or edema Neurologic: Grossly normal Left breast no masses nipple discharge no skin changes. Right mastectomy scar is healing well. There still is some scar tissue formation and some scabbing. But there is no signs of infection para  ECOG PERFORMANCE STATUS: 0 - Asymptomatic  Blood pressure 144/83, pulse 44, temperature 97.9 F (36.6 C), temperature source Oral, resp. rate 20, height 5' 3.5" (1.613 m), weight 228 lb 6.4 oz (103.602  kg).  LABORATORY DATA: Lab Results  Component Value Date   WBC 7.6 05/27/2012   HGB 14.0 05/27/2012   HCT 42.1 05/27/2012   MCV 88.6 05/27/2012   PLT 228 05/27/2012      Chemistry      Component Value Date/Time   NA 136 05/06/2012 1325   K 3.7 05/06/2012 1325   CL 100 05/06/2012 1325   CO2 27 05/06/2012 1325   BUN 17 05/06/2012 1325   CREATININE 0.64 05/06/2012 1325      Component Value Date/Time   CALCIUM 9.4 05/06/2012 1325   ALKPHOS 79 05/06/2012 1325   AST 17 05/06/2012  1325   ALT 16 05/06/2012 1325   BILITOT 0.4 05/06/2012 1325       RADIOGRAPHIC STUDIES:  No results found.  ASSESSMENT: 66 year old female with   #1 stage I HER-2/neu positive breast cancer. Her final stage was 1.9 cm high-grade poorly differentiated invasive ductal carcinoma no LVI ER positive PR positive HER-2/neu positive. She underwent a mastectomy. No sentinel nodes were involved with disease.   #2. Completed chemotherapy but continue Herceptin q 3 weeks to complete out one year of therapy. All of the Herceptin will be completed towards the end of November 2013.  #3 having significant problems with aches and pains most likely due to the Aromasin. I have recommended that she discontinue this at least for now.  #4 patient will remain off of Aromasin. I do not think that aromatase inhibitors are drugs of choice for her. I have not recommended that she begin tamoxifen 20 mg daily. Hopefully she will be able to tolerate this better. Risks and benefits including blood clots were discussed with her today. She knows to call me with any problems questions or concerns.  PLAN:   #1 patient will proceed with her Herceptin today.  #2 patient will begin tamoxifen 20 mg daily risks and benefits of this were discussed with the patient.  #3 patient will return in 3 weeks' time for followup.  I spent 25 minutes counseling the patient face to face. The total time spent in the appointment was 30 minutes.    Drue Second, MD Medical/Oncology [facility] [phone] (beeper) [phone] (Office)  05/27/2012, 2:09 PM

## 2012-05-31 ENCOUNTER — Other Ambulatory Visit: Payer: Self-pay | Admitting: Certified Registered Nurse Anesthetist

## 2012-06-02 ENCOUNTER — Telehealth (INDEPENDENT_AMBULATORY_CARE_PROVIDER_SITE_OTHER): Payer: Self-pay | Admitting: Surgery

## 2012-06-02 NOTE — Telephone Encounter (Signed)
I called the patient to advise of need for bariatric surgery follow-up. I left a voicemail advising the patient to contact our office.  cef °

## 2012-06-09 ENCOUNTER — Ambulatory Visit (HOSPITAL_COMMUNITY): Payer: BC Managed Care – PPO

## 2012-06-09 ENCOUNTER — Ambulatory Visit (HOSPITAL_COMMUNITY): Payer: BC Managed Care – PPO | Attending: Internal Medicine

## 2012-06-17 ENCOUNTER — Ambulatory Visit: Payer: BC Managed Care – PPO | Admitting: Oncology

## 2012-06-17 ENCOUNTER — Other Ambulatory Visit: Payer: BC Managed Care – PPO | Admitting: Lab

## 2012-06-17 ENCOUNTER — Ambulatory Visit: Payer: BC Managed Care – PPO

## 2012-06-17 NOTE — Progress Notes (Signed)
No show.  Called pt home - left message on machine to call and reschedule.  Desk nurse notified.

## 2012-06-20 ENCOUNTER — Telehealth: Payer: Self-pay | Admitting: *Deleted

## 2012-06-20 NOTE — Telephone Encounter (Signed)
Per staff message I have rescheduled the appts. I have called and left the patient a message to call the office. JMW

## 2012-06-21 ENCOUNTER — Other Ambulatory Visit: Payer: Self-pay | Admitting: *Deleted

## 2012-06-21 DIAGNOSIS — C50919 Malignant neoplasm of unspecified site of unspecified female breast: Secondary | ICD-10-CM

## 2012-06-22 ENCOUNTER — Other Ambulatory Visit: Payer: BC Managed Care – PPO | Admitting: Lab

## 2012-06-22 ENCOUNTER — Ambulatory Visit: Payer: BC Managed Care – PPO

## 2012-06-22 ENCOUNTER — Ambulatory Visit: Payer: BC Managed Care – PPO | Admitting: Adult Health

## 2012-07-04 ENCOUNTER — Encounter (HOSPITAL_COMMUNITY): Payer: Self-pay | Admitting: *Deleted

## 2012-07-08 ENCOUNTER — Other Ambulatory Visit (HOSPITAL_BASED_OUTPATIENT_CLINIC_OR_DEPARTMENT_OTHER): Payer: BC Managed Care – PPO | Admitting: Lab

## 2012-07-08 ENCOUNTER — Encounter: Payer: Self-pay | Admitting: Adult Health

## 2012-07-08 ENCOUNTER — Ambulatory Visit (HOSPITAL_BASED_OUTPATIENT_CLINIC_OR_DEPARTMENT_OTHER): Payer: BC Managed Care – PPO

## 2012-07-08 ENCOUNTER — Ambulatory Visit (HOSPITAL_BASED_OUTPATIENT_CLINIC_OR_DEPARTMENT_OTHER): Payer: BC Managed Care – PPO | Admitting: Adult Health

## 2012-07-08 VITALS — BP 127/84 | HR 74 | Temp 97.9°F | Resp 20 | Ht 63.5 in | Wt 243.3 lb

## 2012-07-08 DIAGNOSIS — C50919 Malignant neoplasm of unspecified site of unspecified female breast: Secondary | ICD-10-CM

## 2012-07-08 DIAGNOSIS — Z170 Estrogen receptor positive status [ER+]: Secondary | ICD-10-CM

## 2012-07-08 DIAGNOSIS — Z9011 Acquired absence of right breast and nipple: Secondary | ICD-10-CM

## 2012-07-08 DIAGNOSIS — Z9882 Breast implant status: Secondary | ICD-10-CM

## 2012-07-08 DIAGNOSIS — Z5112 Encounter for antineoplastic immunotherapy: Secondary | ICD-10-CM

## 2012-07-08 LAB — COMPREHENSIVE METABOLIC PANEL (CC13)
AST: 18 U/L (ref 5–34)
Albumin: 3.5 g/dL (ref 3.5–5.0)
Alkaline Phosphatase: 61 U/L (ref 40–150)
BUN: 23 mg/dL (ref 7.0–26.0)
Potassium: 3.8 mEq/L (ref 3.5–5.1)
Sodium: 138 mEq/L (ref 136–145)
Total Bilirubin: 0.4 mg/dL (ref 0.20–1.20)
Total Protein: 6.1 g/dL — ABNORMAL LOW (ref 6.4–8.3)

## 2012-07-08 LAB — CBC WITH DIFFERENTIAL/PLATELET
BASO%: 0.5 % (ref 0.0–2.0)
EOS%: 1.7 % (ref 0.0–7.0)
MCH: 29.5 pg (ref 25.1–34.0)
MCHC: 33.8 g/dL (ref 31.5–36.0)
MCV: 87.3 fL (ref 79.5–101.0)
MONO%: 9.9 % (ref 0.0–14.0)
RBC: 4.64 10*6/uL (ref 3.70–5.45)
RDW: 13.4 % (ref 11.2–14.5)
lymph#: 3 10*3/uL (ref 0.9–3.3)

## 2012-07-08 MED ORDER — HEPARIN SOD (PORK) LOCK FLUSH 100 UNIT/ML IV SOLN
500.0000 [IU] | Freq: Once | INTRAVENOUS | Status: AC | PRN
  Administered 2012-07-08: 500 [IU]
  Filled 2012-07-08: qty 5

## 2012-07-08 MED ORDER — SODIUM CHLORIDE 0.9 % IJ SOLN
10.0000 mL | INTRAMUSCULAR | Status: DC | PRN
  Administered 2012-07-08: 10 mL
  Filled 2012-07-08: qty 10

## 2012-07-08 MED ORDER — SODIUM CHLORIDE 0.9 % IV SOLN
Freq: Once | INTRAVENOUS | Status: AC
  Administered 2012-07-08: 13:00:00 via INTRAVENOUS

## 2012-07-08 MED ORDER — ACETAMINOPHEN 325 MG PO TABS
650.0000 mg | ORAL_TABLET | Freq: Once | ORAL | Status: AC
  Administered 2012-07-08: 650 mg via ORAL

## 2012-07-08 MED ORDER — DIPHENHYDRAMINE HCL 25 MG PO CAPS
50.0000 mg | ORAL_CAPSULE | Freq: Once | ORAL | Status: AC
  Administered 2012-07-08: 25 mg via ORAL

## 2012-07-08 MED ORDER — SODIUM CHLORIDE 0.9 % IV SOLN
6.0000 mg/kg | Freq: Once | INTRAVENOUS | Status: AC
  Administered 2012-07-08: 609 mg via INTRAVENOUS
  Filled 2012-07-08: qty 29

## 2012-07-08 NOTE — Patient Instructions (Addendum)
[facility] Discharge Instructions for Patients Receiving Chemotherapy  Today you received the following chemotherapy agents herceptin  To help prevent nausea and vomiting after your treatment, we encourage you to take your nausea medication  and take it as often as prescribedIf you develop nausea and vomiting that is not controlled by your nausea medication, call the clinic. If it is after clinic hours your family physician or the after hours number for the clinic or go to the Emergency Department.   BELOW ARE SYMPTOMS THAT SHOULD BE REPORTED IMMEDIATELY:  *FEVER GREATER THAN 100.5 F  *CHILLS WITH OR WITHOUT FEVER  NAUSEA AND VOMITING THAT IS NOT CONTROLLED WITH YOUR NAUSEA MEDICATION  *UNUSUAL SHORTNESS OF BREATH  *UNUSUAL BRUISING OR BLEEDING  TENDERNESS IN MOUTH AND THROAT WITH OR WITHOUT PRESENCE OF ULCERS  *URINARY PROBLEMS  *BOWEL PROBLEMS  UNUSUAL RASH Items with * indicate a potential emergency and should be followed up as soon as possible.  One of the nurses will contact you 24 hours after your treatment. Please let the nurse know about any problems that you may have experienced. Feel free to call the clinic you have any questions or concerns. The clinic phone number is [phone].   I have been informed and understand all the instructions given to me. I know to contact the clinic, my physician, or go to the Emergency Department if any problems should occur. I do not have any questions at this time, but understand that I may call the clinic during office hours   should I have any questions or need assistance in obtaining follow up care.    __________________________________________  _____________  __________ Signature of Patient or Authorized Representative            Date                   Time    __________________________________________ Nurse's Signature    

## 2012-07-08 NOTE — Progress Notes (Signed)
OFFICE PROGRESS NOTE  CC [name] M.D. [name] M.D.  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  PRIOR THERAPY:  #1 status post right simple mastectomy with right axillary sentinel node biopsy and left subclavian power port insertion on 06/26/2011.  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 Sentinel node biopsy done at the time of her mastectomy was negative for metastatic disease. Pathologic stage TI C. N0 M0, stage I breast cancer.  #3 patient has been receiving weekly Taxotere carboplatinum and Herceptin beginning November 2012. However she has had considerable side effects requiring discontinuation/holding of the chemotherapy.  #4 S/P Taxotere/Crboplatin q 3 weeks with weekly Herceptin beginning 09/30/11 x 3 cycles completed on 11/11/11  #5. Herceptin q 21 days begin 12/02/11  #6 adjuvant Aromasin 25 mg daily starting 02/05/2012.this was discontinued September 2013 do to incapacitating myalgias and arthralgias.  #7 tamoxifen 20 mg daily begun 05/27/2012.  CURRENT THERAPY: Herceptin q 21 days starting 12/02/11 , tamoxifen 20 mg daily starting 05/27/2012  INTERVAL HISTORY: [name] 66 y.o. female followup visit today for her Scheduled Herceptin today. She missed her last Herceptin treatment due to a fibromyalgia flare.  She also is in need of an echo and mammogram.  She is feeling much improved with the tamoxifen rather than aromasin.  Otherwise she is well and w/o complaints.    MEDICAL HISTORY: Past Medical History  Diagnosis Date  . Allergy   . Asthma   . Hypertension   . Hyperlipidemia   . Depression   . Fibromyalgia   . Arthritis     feet, knee and base of thumbs  . Osteopenia   . Carpal tunnel  syndrome     right  . Breast cancer, stage 1 07/18/2011  . Dehydration 10/21/2011  . PONV (postoperative nausea and vomiting)     has had to use scop patch    ALLERGIES:   has no known allergies.  MEDICATIONS:  Current Outpatient Prescriptions  Medication Sig Dispense Refill  . alendronate (FOSAMAX) 70 MG tablet Take 70 mg by mouth every 7 (seven) days. Take with a full glass of water on an empty stomach.      Marland Kitchen atorvastatin (LIPITOR) 20 MG tablet Take 20 mg by mouth daily.        . calcium gluconate 500 MG tablet Take 500 mg by mouth daily.        . cholecalciferol (VITAMIN D) 1000 UNITS tablet Take 2,000 Units by mouth daily.        . DULoxetine (CYMBALTA) 60 MG capsule Take 120 mg by mouth daily.       Marland Kitchen gabapentin (NEURONTIN) 600 MG tablet Take 600 mg by mouth daily.       . hydrochlorothiazide (HYDRODIURIL) 25 MG tablet Take 25 mg by mouth daily.      Boris Lown Oil 300 MG CAPS Take 1 capsule by mouth daily.        Marland Kitchen lidocaine-prilocaine (EMLA) cream Apply 1 application topically as needed.        Marland Kitchen lisinopril (PRINIVIL,ZESTRIL) 10 MG tablet Take 10 mg by mouth daily.      Marland Kitchen loratadine (CLARITIN) 10 MG tablet Take 10 mg by mouth daily.        . magnesium 30 MG tablet  Take 500 mg by mouth daily.       . Multiple Vitamin (MULTIVITAMIN) capsule Take 1 capsule by mouth daily.        Marland Kitchen sulfamethoxazole-trimethoprim (BACTRIM DS) 800-160 MG per tablet       . TAZORAC 0.1 % cream       . traMADol (ULTRAM) 50 MG tablet Take 1 tablet (50 mg total) by mouth as needed for pain (PRN for osteoarthritis and fibromyalgia pain. ).  90 tablet  0  . DISCONTD: diphenhydrAMINE (SOMINEX) 25 MG tablet Take 25 mg by mouth at bedtime as needed.          SURGICAL HISTORY:  Past Surgical History  Procedure Date  . Tonsillectomy 1950  . Tubal ligation 1978  . Abdominal hysterectomy 1981  . Right ear surgery 1983    tumor removed (?)  . Right breast lumpectomy 1986  . Ethmoidectomy 1991  . Knee  arthroscopy 1996, 2001    left  . Facial plastic surgery 1996  . Bunionectomy 1998    left foot  . Total knee arthroplasty 2006    left  . Nasal sinus surgery 2001  . Lap band surgery 2010  . Right mastectomy 06-26-11  . Port-a-cath placement 06/26/11    tip in lower SVC per chest x-ray read by Dr. Dwyane Dee  . Mastopexy 04/13/2012    Procedure: MASTOPEXY;  Surgeon: Wayland Denis, DO;  Location: [facility];  Service: Plastics;  Laterality: Left;   left breast  mastopexy reduction for symmetry    Health Maintenance  Mammogram: 05/2011 Colonoscopy: 10 years ago Bone Density Scan: 05/2011 (osteopenia) Pap Smear: 05/2011 Eye Exam: 03/2012 Vitamin D Level: 06/2012-29 Lipid Panel: 06/2012   REVIEW OF SYSTEMS:   General: fatigue (-), night sweats (-), fever (-), pain (-) Lymph: palpable nodes (-) HEENT: vision changes (-), mucositis (-), gum bleeding (-), epistaxis (-) Cardiovascular: chest pain (-), palpitations (-) Pulmonary: shortness of breath (-), dyspnea on exertion (-), cough (-), hemoptysis (-) GI:  Early satiety (-), melena (-), dysphagia (-), nausea/vomiting (-), diarrhea (-) GU: dysuria (-), hematuria (-), incontinence (-) Musculoskeletal: joint swelling (-), joint pain (-), back pain (-) Neuro: weakness (-), numbness (-), headache (-), confusion (-) Skin: Rash (-), lesions (-), dryness (-) Psych: depression (-), suicidal/homicidal ideation (-), feeling of hopelessness (-)   PHYSICAL EXAMINATION:  BP 127/84  Pulse 74  Temp 97.9 F (36.6 C) (Oral)  Resp 20  Ht 5' 3.5" (1.613 m)  Wt 243 lb 4.8 oz (110.36 kg)  BMI 42.42 kg/m2 General: Patient is a well appearing female in no acute distress HEENT: PERRLA, sclerae anicteric no conjunctival pallor, MMM Neck: supple, no palpable adenopathy Lungs: clear to auscultation bilaterally, no wheezes, rhonchi, or rales Cardiovascular: regular rate rhythm, S1, S2, no murmurs, rubs or gallops Abdomen: Soft,  non-tender, non-distended, normoactive bowel sounds, no HSM Extremities: warm and well perfused, no clubbing, cyanosis, or edema Skin: No rashes or lesions Neuro: Non-focal Left breast no masses nipple discharge, scar from reduction healing well. Right mastectomy scar with implant is healing well. There still is some scar tissue formation and some scabbing. But there is no signs of infection  ECOG PERFORMANCE STATUS: 0 - Asymptomatic  LABORATORY DATA: Lab Results  Component Value Date   WBC 7.5 07/08/2012   HGB 13.7 07/08/2012   HCT 40.5 07/08/2012   MCV 87.3 07/08/2012   PLT 194 07/08/2012      Chemistry      Component  Value Date/Time   NA 137 05/27/2012 1321   NA 136 05/06/2012 1325   K 3.5 05/27/2012 1321   K 3.7 05/06/2012 1325   CL 105 05/27/2012 1321   CL 100 05/06/2012 1325   CO2 22 05/27/2012 1321   CO2 27 05/06/2012 1325   BUN 21.0 05/27/2012 1321   BUN 17 05/06/2012 1325   CREATININE 0.8 05/27/2012 1321   CREATININE 0.64 05/06/2012 1325      Component Value Date/Time   CALCIUM 9.1 05/27/2012 1321   CALCIUM 9.4 05/06/2012 1325   ALKPHOS 79 05/06/2012 1325   AST 17 05/06/2012 1325   ALT 16 05/06/2012 1325   BILITOT 0.4 05/06/2012 1325       RADIOGRAPHIC STUDIES:  No results found.  ASSESSMENT: 66 year old female with   #1 stage I HER-2/neu positive breast cancer. Her final stage was 1.9 cm high-grade poorly differentiated invasive ductal carcinoma no LVI ER positive PR positive HER-2/neu positive. She underwent a mastectomy. No sentinel nodes were involved with disease.   #2. Completed chemotherapy but continue Herceptin q 3 weeks to complete out one year of therapy. All of the Herceptin will be completed towards the end of December 2013.  #3 having significant problems with aches and pains most likely due to the Aromasin. This was discontinued.  #4 patient will remain off of Aromasin. I do not think that aromatase inhibitors are drugs of choice for her. I have not  recommended that she begin tamoxifen 20 mg daily. Hopefully she will be able to tolerate this better. Risks and benefits including blood clots were discussed with her today. She knows to call me with any problems questions or concerns.  PLAN:   #1 Ms. Segel will proceed with Herceptin today.  She is doing well.  She is due for another echo and this has been ordered and an appt with Dr. Clarise Cruz requested.  Also, she will need another diagnostic mammogram.  She would feel more comfortable if it were after her appt with her plastic surgeon, the test has been ordered.    #2 She will continjue tamoxifen 20 mg daily.  She is tolerating this well.    #3 patient will return in 3 weeks' time for followup.  I spent 25 minutes counseling the patient face to face. The total time spent in the appointment was 30 minutes.   Cherie Ouch Lyn Hollingshead, NP Medical Oncology [facility] Phone: [phone] 07/08/2012, 12:00 PM

## 2012-07-08 NOTE — Patient Instructions (Signed)
We will schedule your echo and mammogram.  Continue with Tamoxifen daily.  We will see you back in 3 weeks.

## 2012-07-12 ENCOUNTER — Telehealth: Payer: Self-pay | Admitting: Oncology

## 2012-07-12 NOTE — Telephone Encounter (Signed)
lmonvm adviisng the pt of her echo/appt with dr benshimon in nov at the [facility] at [facility].

## 2012-07-29 ENCOUNTER — Other Ambulatory Visit (HOSPITAL_BASED_OUTPATIENT_CLINIC_OR_DEPARTMENT_OTHER): Payer: BC Managed Care – PPO | Admitting: Lab

## 2012-07-29 ENCOUNTER — Ambulatory Visit (HOSPITAL_BASED_OUTPATIENT_CLINIC_OR_DEPARTMENT_OTHER): Payer: BC Managed Care – PPO | Admitting: Oncology

## 2012-07-29 ENCOUNTER — Telehealth: Payer: Self-pay | Admitting: *Deleted

## 2012-07-29 ENCOUNTER — Ambulatory Visit (HOSPITAL_BASED_OUTPATIENT_CLINIC_OR_DEPARTMENT_OTHER): Payer: BC Managed Care – PPO

## 2012-07-29 ENCOUNTER — Encounter: Payer: Self-pay | Admitting: Oncology

## 2012-07-29 VITALS — BP 132/83 | HR 84 | Temp 98.3°F | Resp 20 | Ht 63.5 in | Wt 233.1 lb

## 2012-07-29 DIAGNOSIS — Z170 Estrogen receptor positive status [ER+]: Secondary | ICD-10-CM | POA: Diagnosis not present

## 2012-07-29 DIAGNOSIS — C50919 Malignant neoplasm of unspecified site of unspecified female breast: Secondary | ICD-10-CM

## 2012-07-29 DIAGNOSIS — Z9010 Acquired absence of unspecified breast and nipple: Secondary | ICD-10-CM | POA: Diagnosis not present

## 2012-07-29 DIAGNOSIS — Z5112 Encounter for antineoplastic immunotherapy: Secondary | ICD-10-CM | POA: Diagnosis not present

## 2012-07-29 LAB — CBC WITH DIFFERENTIAL/PLATELET
Basophils Absolute: 0 10*3/uL (ref 0.0–0.1)
EOS%: 2.1 % (ref 0.0–7.0)
Eosinophils Absolute: 0.1 10*3/uL (ref 0.0–0.5)
HGB: 13.7 g/dL (ref 11.6–15.9)
LYMPH%: 45.6 % (ref 14.0–49.7)
MCH: 29.3 pg (ref 25.1–34.0)
MCV: 87 fL (ref 79.5–101.0)
MONO%: 9.3 % (ref 0.0–14.0)
Platelets: 200 10*3/uL (ref 145–400)
RDW: 13.5 % (ref 11.2–14.5)

## 2012-07-29 LAB — COMPREHENSIVE METABOLIC PANEL (CC13)
Alkaline Phosphatase: 56 U/L (ref 40–150)
BUN: 20 mg/dL (ref 7.0–26.0)
CO2: 27 mEq/L (ref 22–29)
Creatinine: 0.7 mg/dL (ref 0.6–1.1)
Glucose: 88 mg/dl (ref 70–99)
Sodium: 137 mEq/L (ref 136–145)
Total Bilirubin: 0.67 mg/dL (ref 0.20–1.20)
Total Protein: 6.3 g/dL — ABNORMAL LOW (ref 6.4–8.3)

## 2012-07-29 MED ORDER — DIPHENHYDRAMINE HCL 25 MG PO CAPS: 50.0000 mg | ORAL_CAPSULE | Freq: Once | ORAL | Status: DC

## 2012-07-29 MED ORDER — SODIUM CHLORIDE 0.9 % IJ SOLN
10.0000 mL | INTRAMUSCULAR | Status: DC | PRN
  Administered 2012-07-29: 10 mL
  Filled 2012-07-29: qty 10

## 2012-07-29 MED ORDER — TRASTUZUMAB CHEMO INJECTION 440 MG
6.0000 mg/kg | Freq: Once | INTRAVENOUS | Status: AC
  Administered 2012-07-29: 609 mg via INTRAVENOUS
  Filled 2012-07-29: qty 29

## 2012-07-29 MED ORDER — HEPARIN SOD (PORK) LOCK FLUSH 100 UNIT/ML IV SOLN
500.0000 [IU] | Freq: Once | INTRAVENOUS | Status: AC | PRN
  Administered 2012-07-29: 500 [IU]
  Filled 2012-07-29: qty 5

## 2012-07-29 MED ORDER — ACETAMINOPHEN 325 MG PO TABS
650.0000 mg | ORAL_TABLET | Freq: Once | ORAL | Status: AC
  Administered 2012-07-29: 650 mg via ORAL

## 2012-07-29 MED ORDER — SODIUM CHLORIDE 0.9 % IV SOLN
Freq: Once | INTRAVENOUS | Status: AC
  Administered 2012-07-29: 13:00:00 via INTRAVENOUS

## 2012-07-29 NOTE — Patient Instructions (Addendum)
Proceed with herceptin today return  In 3 weeks for next  herceptin

## 2012-07-29 NOTE — Patient Instructions (Addendum)
[facility] Discharge Instructions for Patients Receiving Chemotherapy  Today you received the following chemotherapy agents :  Herceptin.  To help prevent nausea and vomiting after your treatment, we encourage you to take your nausea medication as instructed by your physician.    If you develop nausea and vomiting that is not controlled by your nausea medication, call the clinic. If it is after clinic hours your family physician or the after hours number for the clinic or go to the Emergency Department.   BELOW ARE SYMPTOMS THAT SHOULD BE REPORTED IMMEDIATELY:  *FEVER GREATER THAN 100.5 F  *CHILLS WITH OR WITHOUT FEVER  NAUSEA AND VOMITING THAT IS NOT CONTROLLED WITH YOUR NAUSEA MEDICATION  *UNUSUAL SHORTNESS OF BREATH  *UNUSUAL BRUISING OR BLEEDING  TENDERNESS IN MOUTH AND THROAT WITH OR WITHOUT PRESENCE OF ULCERS  *URINARY PROBLEMS  *BOWEL PROBLEMS  UNUSUAL RASH Items with * indicate a potential emergency and should be followed up as soon as possible.  One of the nurses will contact you 24 hours after your treatment. Please let the nurse know about any problems that you may have experienced. Feel free to call the clinic you have any questions or concerns. The clinic phone number is [phone].   I have been informed and understand all the instructions given to me. I know to contact the clinic, my physician, or go to the Emergency Department if any problems should occur. I do not have any questions at this time, but understand that I may call the clinic during office hours   should I have any questions or need assistance in obtaining follow up care.    __________________________________________  _____________  __________ Signature of Patient or Authorized Representative            Date                   Time    __________________________________________ Nurse's Signature    

## 2012-07-29 NOTE — Telephone Encounter (Signed)
Gave patient appointment for 09-09-2012 sent michelle email to set up patient treatment

## 2012-07-29 NOTE — Progress Notes (Signed)
OFFICE PROGRESS NOTE  CC [name] M.D. [name] M.D.  [facility], [place] 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  PRIOR THERAPY:  #1 status post right simple mastectomy with right axillary sentinel node biopsy and left subclavian power port insertion on 06/26/2011.  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 Sentinel node biopsy done at the time of her mastectomy was negative for metastatic disease. Pathologic stage TI C. N0 M0, stage I breast cancer.  #3 patient has been receiving weekly Taxotere carboplatinum and Herceptin beginning November 2012. However she has had considerable side effects requiring discontinuation/holding of the chemotherapy.  #4 S/P Taxotere/Crboplatin q 3 weeks with weekly Herceptin beginning 09/30/11 x 3 cycles completed on 11/11/11  #5. Herceptin q 21 days begin 12/02/11  #6 adjuvant Aromasin 25 mg daily starting 02/05/2012.this was discontinued September 2013 do to incapacitating myalgias and arthralgias.  #7 tamoxifen 20 mg daily begun 05/27/2012.  CURRENT THERAPY: Herceptin q 21 days starting 12/02/11 , tamoxifen 20 mg daily starting 05/27/2012  INTERVAL HISTORY: [name] 66 y.o. female followup visit today for her Scheduled Herceptin today. She also is in need of an echo and mammogram.  She is feeling much improved with the tamoxifen rather than aromasin.  Otherwise she is well and w/o complaints.    MEDICAL HISTORY: Past Medical History  Diagnosis Date  . Allergy   . Asthma   . Hypertension   . Hyperlipidemia   . Depression   . Fibromyalgia   . Arthritis     feet, knee and base of thumbs  . Osteopenia   . Carpal tunnel syndrome     right  . Breast cancer, stage 1 07/18/2011  .  Dehydration 10/21/2011  . PONV (postoperative nausea and vomiting)     has had to use scop patch    ALLERGIES:   has no known allergies.  MEDICATIONS:  Current Outpatient Prescriptions  Medication Sig Dispense Refill  . alendronate (FOSAMAX) 70 MG tablet Take 70 mg by mouth every 7 (seven) days. Take with a full glass of water on an empty stomach.      Marland Kitchen atorvastatin (LIPITOR) 20 MG tablet Take 20 mg by mouth daily.        . calcium gluconate 500 MG tablet Take 500 mg by mouth daily.        . cholecalciferol (VITAMIN D) 1000 UNITS tablet Take 2,000 Units by mouth daily.        . DULoxetine (CYMBALTA) 60 MG capsule Take 120 mg by mouth daily.       Marland Kitchen gabapentin (NEURONTIN) 600 MG tablet Take 600 mg by mouth daily.       . hydrochlorothiazide (HYDRODIURIL) 25 MG tablet Take 25 mg by mouth daily.      Boris Lown Oil 300 MG CAPS Take 1 capsule by mouth daily.        Marland Kitchen lidocaine-prilocaine (EMLA) cream Apply 1 application topically as needed.        Marland Kitchen lisinopril (PRINIVIL,ZESTRIL) 10 MG tablet Take 10 mg by mouth daily.      Marland Kitchen loratadine (CLARITIN) 10 MG tablet Take 10 mg by mouth daily.        . magnesium 30 MG tablet Take 500 mg by mouth daily.       Marland Kitchen  Multiple Vitamin (MULTIVITAMIN) capsule Take 1 capsule by mouth daily.        Marland Kitchen TAZORAC 0.1 % cream       . traMADol (ULTRAM) 50 MG tablet Take 1 tablet (50 mg total) by mouth as needed for pain (PRN for osteoarthritis and fibromyalgia pain. ).  90 tablet  0  . [DISCONTINUED] diphenhydrAMINE (SOMINEX) 25 MG tablet Take 25 mg by mouth at bedtime as needed.          SURGICAL HISTORY:  Past Surgical History  Procedure Date  . Tonsillectomy 1950  . Tubal ligation 1978  . Abdominal hysterectomy 1981  . Right ear surgery 1983    tumor removed (?)  . Right breast lumpectomy 1986  . Ethmoidectomy 1991  . Knee arthroscopy 1996, 2001    left  . Facial plastic surgery 1996  . Bunionectomy 1998    left foot  . Total knee arthroplasty 2006     left  . Nasal sinus surgery 2001  . Lap band surgery 2010  . Right mastectomy 06-26-11  . Port-a-cath placement 06/26/11    tip in lower SVC per chest x-ray read by Dr. Dwyane Dee  . Mastopexy 04/13/2012    Procedure: MASTOPEXY;  Surgeon: Wayland Denis, DO;  Location: [facility];  Service: Plastics;  Laterality: Left;   left breast  mastopexy reduction for symmetry    Health Maintenance  Mammogram: 05/2011 Colonoscopy: 10 years ago Bone Density Scan: 05/2011 (osteopenia) Pap Smear: 05/2011 Eye Exam: 03/2012 Vitamin D Level: 06/2012-29 Lipid Panel: 06/2012   REVIEW OF SYSTEMS:   General: fatigue (-), night sweats (-), fever (-), pain (-) Lymph: palpable nodes (-) HEENT: vision changes (-), mucositis (-), gum bleeding (-), epistaxis (-) Cardiovascular: chest pain (-), palpitations (-) Pulmonary: shortness of breath (-), dyspnea on exertion (-), cough (-), hemoptysis (-) GI:  Early satiety (-), melena (-), dysphagia (-), nausea/vomiting (-), diarrhea (-) GU: dysuria (-), hematuria (-), incontinence (-) Musculoskeletal: joint swelling (-), joint pain (-), back pain (-) Neuro: weakness (-), numbness (-), headache (-), confusion (-) Skin: Rash (-), lesions (-), dryness (-) Psych: depression (-), suicidal/homicidal ideation (-), feeling of hopelessness (-)   PHYSICAL EXAMINATION:  BP 132/83  Pulse 84  Temp 98.3 F (36.8 C) (Oral)  Resp 20  Ht 5' 3.5" (1.613 m)  Wt 233 lb 1.6 oz (105.733 kg)  BMI 40.64 kg/m2 General: Patient is a well appearing female in no acute distress HEENT: PERRLA, sclerae anicteric no conjunctival pallor, MMM Neck: supple, no palpable adenopathy Lungs: clear to auscultation bilaterally, no wheezes, rhonchi, or rales Cardiovascular: regular rate rhythm, S1, S2, no murmurs, rubs or gallops Abdomen: Soft, non-tender, non-distended, normoactive bowel sounds, no HSM Extremities: warm and well perfused, no clubbing, cyanosis, or edema Skin: No  rashes or lesions Neuro: Non-focal Left breast no masses nipple discharge, scar from reduction healing well. Right mastectomy scar with implant is healing well. There still is some scar tissue formation and some scabbing. But there is no signs of infection  ECOG PERFORMANCE STATUS: 0 - Asymptomatic  LABORATORY DATA: Lab Results  Component Value Date   WBC 6.8 07/29/2012   HGB 13.7 07/29/2012   HCT 40.7 07/29/2012   MCV 87.0 07/29/2012   PLT 200 07/29/2012      Chemistry      Component Value Date/Time   NA 138 07/08/2012 1134   NA 136 05/06/2012 1325   K 3.8 07/08/2012 1134   K 3.7 05/06/2012 1325  CL 104 07/08/2012 1134   CL 100 05/06/2012 1325   CO2 24 07/08/2012 1134   CO2 27 05/06/2012 1325   BUN 23.0 07/08/2012 1134   BUN 17 05/06/2012 1325   CREATININE 0.7 07/08/2012 1134   CREATININE 0.64 05/06/2012 1325      Component Value Date/Time   CALCIUM 9.3 07/08/2012 1134   CALCIUM 9.4 05/06/2012 1325   ALKPHOS 61 07/08/2012 1134   ALKPHOS 79 05/06/2012 1325   AST 18 07/08/2012 1134   AST 17 05/06/2012 1325   ALT 18 07/08/2012 1134   ALT 16 05/06/2012 1325   BILITOT 0.40 07/08/2012 1134   BILITOT 0.4 05/06/2012 1325       RADIOGRAPHIC STUDIES:  No results found.  ASSESSMENT: 66 year old female with   #1 stage I HER-2/neu positive breast cancer. Her final stage was 1.9 cm high-grade poorly differentiated invasive ductal carcinoma no LVI ER positive PR positive HER-2/neu positive. She underwent a mastectomy. No sentinel nodes were involved with disease.   #2. Completed chemotherapy but continue Herceptin q 3 weeks to complete out one year of therapy. All of the Herceptin will be completed towards the end of December 2013.  #3 having significant problems with aches and pains most likely due to the Aromasin. This was discontinued.  #4 patient will remain off of Aromasin. I do not think that aromatase inhibitors are drugs of choice for her. I have not recommended that she  begin tamoxifen 20 mg daily. Hopefully she will be able to tolerate this better. Risks and benefits including blood clots were discussed with her today. She knows to call me with any problems questions or concerns.  PLAN:   #1 Ms. Rothman will proceed with Herceptin today.  She is doing well.    #2 She will continue tamoxifen 20 mg daily.  She is tolerating this well.    #3 patient will return in 3 weeks' time for followup.  I spent 25 minutes counseling the patient face to face. The total time spent in the appointment was 30 minutes.  Drue Second, MD Medical/Oncology [facility] [phone] (beeper) [phone] (Office)  07/29/2012, 11:42 AM

## 2012-08-03 ENCOUNTER — Ambulatory Visit (HOSPITAL_BASED_OUTPATIENT_CLINIC_OR_DEPARTMENT_OTHER)
Admission: RE | Admit: 2012-08-03 | Discharge: 2012-08-03 | Disposition: A | Discharge status: Home / Self Care | Payer: BC Managed Care – PPO | Source: Ambulatory Visit | Attending: Internal Medicine | Admitting: Internal Medicine

## 2012-08-03 ENCOUNTER — Encounter (HOSPITAL_COMMUNITY): Payer: Self-pay

## 2012-08-03 ENCOUNTER — Ambulatory Visit (HOSPITAL_COMMUNITY)
Admission: RE | Admit: 2012-08-03 | Discharge: 2012-08-03 | Disposition: A | Discharge status: Home / Self Care | Payer: BC Managed Care – PPO | Source: Ambulatory Visit | Attending: Family Medicine | Admitting: Family Medicine

## 2012-08-03 VITALS — BP 126/70 | HR 85 | Wt 231.4 lb

## 2012-08-03 DIAGNOSIS — C50919 Malignant neoplasm of unspecified site of unspecified female breast: Secondary | ICD-10-CM | POA: Diagnosis not present

## 2012-08-03 DIAGNOSIS — D4989 Neoplasm of unspecified behavior of other specified sites: Secondary | ICD-10-CM

## 2012-08-03 NOTE — Progress Notes (Signed)
Patient ID: [name], female   DOB: 07-22-1946, 66 y.o.   MRN: [id number] Referring Physician: Drue Second ,MD Primary Physician: Mertha Finders, MD  Reason for Consultation: Herceptin HPI: [name] is a 66 year old woman with history of multicentric ER/PR, HER2-positive right breast carcinoma, status post right simple mastectomy with axillary node dissection on 06/26/11 with final path revealing 1.9-cm invasive ductal carcinoma with a Cage ratio of 9.73, pathologic staging of T1c N0. She has hsitory of depression, HTN, Hyperlipidemia, and fibromyalgia.  Diagnosed with her breast CA in September 2012. In November, started Taxotere, carboplatinum and Herceptin.    11/12 ECHO  EF 55-60% grade I diastolic dysfunction Lateral s' 11.4 cm/s 11/18/11 ECHO EF 60% Lateral S' 11.7 03/15/12 ECHo EF 60% lateral S' 11.3 (reviewed and revised today 08/03/12) 08/03/12 ECHO EF 50-55% Lateral S' 11.5  She returns for follow up. Denies SOB/PND/Orthonpea. Plans to complete Herceptin September 09, 2012.   Review of Systems:     Cardiac Review of Systems: {Y] = yes [ ]  = no  Chest Pain [    ]  Resting SOB [   ] Exertional SOB  [Y  ]  Orthopnea [  ]   Pedal Edema [   ]    Palpitations [  ] Syncope  [  ]   Presyncope [   ]  General Review of Systems: [Y] = yes [  ]=no Constitional: recent weight change [  ]; anorexia [  ]; fatigue [ Y ]; nausea [  ]; night sweats [  ]; fever [  ]; or chills [  ];                                                                                                                                          Dental: poor dentition[  ];   Eye : blurred vision [  ]; diplopia [   ]; vision changes [  ];  Amaurosis fugax[  ]; Resp: cough [  ];  wheezing[  ];  hemoptysis[  ]; shortness of breath[  ]; paroxysmal nocturnal dyspnea[  ]; dyspnea on exertion[  ]; or orthopnea[  ];  GI:  gallstones[  ], vomiting[  ];  dysphagia[  ]; melena[  ];  hematochezia [  ]; heartburn[  ];   Hx of  Colonoscopy[   ]; GU: kidney stones [  ]; hematuria[  ];   dysuria [  ];  nocturia[  ];  history of     obstruction [  ];                 Skin: rash, swelling[  ];, hair loss[  ];  peripheral edema[  ];  or itching[  ]; Musculosketetal: myalgias[Y  ];  joint swelling[  ];  joint erythema[  ];  joint pain[Y  ];  back pain[  ];  Heme/Lymph: bruising[  ];  bleeding[  ];  anemia[  ];  Neuro: TIA[  ];  headaches[  ];  stroke[  ];  vertigo[  ];  seizures[  ];   paresthesias[  ];  difficulty walking[  ];  Psych:depression[Y  ]; anxiety[  ];  Endocrine: diabetes[  ];  thyroid dysfunction[  ];  Immunizations: Flu [  ]; Pneumococcal[  ];  Other:  Past Medical History  Diagnosis Date  . Allergy   . Asthma   . Hypertension   . Hyperlipidemia   . Depression   . Fibromyalgia   . Arthritis     feet, knee and base of thumbs  . Osteopenia   . Carpal tunnel syndrome     right  . Breast cancer, stage 1 07/18/2011  . Dehydration 10/21/2011  . PONV (postoperative nausea and vomiting)     has had to use scop patch     (Not in a hospital admission)   No Known Allergies  History   Social History  . Marital Status: Single    Spouse Name: N/A    Number of Children: N/A  . Years of Education: N/A   Occupational History  . Not on file.   Social History Main Topics  . Smoking status: Never Smoker   . Smokeless tobacco: Not on file  . Alcohol Use: 0.6 oz/week    1 Glasses of wine per week  . Drug Use: Not on file  . Sexually Active: Not Currently   Other Topics Concern  . Not on file   Social History Narrative  . No narrative on file    Family History  Problem Relation Age of Onset  . Breast cancer Paternal Aunt   . Breast cancer Paternal Grandmother     PHYSICAL EXAM: Filed Vitals:   08/03/12 1137  BP: 126/70  Pulse: 85   General:NAD  No respiratory difficulty HEENT: normal Neck: supple. no JVD. Carotids 2+ bilat; no bruits. No lymphadenopathy or thryomegaly appreciated. Cor: PMI  nondisplaced. Mildly irregular. No rubs, gallops or murmurs. Lungs: clear Abdomen: soft, nontender, nondistended. No hepatosplenomegaly. No bruits or masses. Good bowel sounds. Extremities: no cyanosis, clubbing, rash, edema RLE ankle brace Neuro: alert & oriented x 3, cranial nerves grossly intact. moves all 4 extremities w/o difficulty. Affect pleasant.   ASSESSMENT/PLAN:

## 2012-08-03 NOTE — Assessment & Plan Note (Addendum)
Dr Gala Romney discussed and reviewed ECHOs. EF and lateral S ' stable. No evidence of cardiotoxicity. Follow up in 3 months for final ECHO as Herceptin completion expected 09/09/12.   Patient seen and examined with Tonye Becket, NP. We discussed all aspects of the encounter. I agree with the assessment and plan as stated above.  I reviewed echos personally. EF and Doppler parameters stable. No HF on exam. Continue Herceptin.

## 2012-08-03 NOTE — Progress Notes (Signed)
  Echocardiogram 2D Echocardiogram has been performed.  Corby Villasenor FRANCES 08/03/2012, 11:12 AM

## 2012-08-03 NOTE — Patient Instructions (Addendum)
Follow up in 3 months with an ECHO and Dr Bensimhon 

## 2012-08-18 ENCOUNTER — Ambulatory Visit: Payer: BC Managed Care – PPO

## 2012-08-19 ENCOUNTER — Telehealth: Payer: Self-pay | Admitting: Oncology

## 2012-08-19 ENCOUNTER — Ambulatory Visit (HOSPITAL_BASED_OUTPATIENT_CLINIC_OR_DEPARTMENT_OTHER): Payer: BC Managed Care – PPO

## 2012-08-19 ENCOUNTER — Other Ambulatory Visit (HOSPITAL_BASED_OUTPATIENT_CLINIC_OR_DEPARTMENT_OTHER): Payer: BC Managed Care – PPO

## 2012-08-19 ENCOUNTER — Encounter: Payer: Self-pay | Admitting: Adult Health

## 2012-08-19 ENCOUNTER — Ambulatory Visit (HOSPITAL_BASED_OUTPATIENT_CLINIC_OR_DEPARTMENT_OTHER): Payer: BC Managed Care – PPO | Admitting: Adult Health

## 2012-08-19 VITALS — BP 125/76 | HR 71 | Temp 98.3°F | Resp 20 | Ht 63.5 in | Wt 234.4 lb

## 2012-08-19 DIAGNOSIS — C50919 Malignant neoplasm of unspecified site of unspecified female breast: Secondary | ICD-10-CM

## 2012-08-19 DIAGNOSIS — Z170 Estrogen receptor positive status [ER+]: Secondary | ICD-10-CM

## 2012-08-19 DIAGNOSIS — R52 Pain, unspecified: Secondary | ICD-10-CM

## 2012-08-19 DIAGNOSIS — B351 Tinea unguium: Secondary | ICD-10-CM | POA: Diagnosis not present

## 2012-08-19 DIAGNOSIS — Z5112 Encounter for antineoplastic immunotherapy: Secondary | ICD-10-CM

## 2012-08-19 LAB — COMPREHENSIVE METABOLIC PANEL (CC13)
ALT: 18 U/L (ref 0–55)
AST: 18 U/L (ref 5–34)
Calcium: 9 mg/dL (ref 8.4–10.4)
Chloride: 101 mEq/L (ref 98–107)
Creatinine: 0.7 mg/dL (ref 0.6–1.1)
Sodium: 136 mEq/L (ref 136–145)
Total Bilirubin: 0.67 mg/dL (ref 0.20–1.20)

## 2012-08-19 LAB — CBC WITH DIFFERENTIAL/PLATELET
BASO%: 0.8 % (ref 0.0–2.0)
EOS%: 2.9 % (ref 0.0–7.0)
HCT: 38.2 % (ref 34.8–46.6)
MCH: 29 pg (ref 25.1–34.0)
MCHC: 33.2 g/dL (ref 31.5–36.0)
NEUT%: 44.6 % (ref 38.4–76.8)
RBC: 4.38 10*6/uL (ref 3.70–5.45)
RDW: 13.4 % (ref 11.2–14.5)
lymph#: 2.7 10*3/uL (ref 0.9–3.3)

## 2012-08-19 MED ORDER — HEPARIN SOD (PORK) LOCK FLUSH 100 UNIT/ML IV SOLN
500.0000 [IU] | Freq: Once | INTRAVENOUS | Status: AC | PRN
  Administered 2012-08-19: 500 [IU]
  Filled 2012-08-19: qty 5

## 2012-08-19 MED ORDER — ACETAMINOPHEN 325 MG PO TABS
650.0000 mg | ORAL_TABLET | Freq: Once | ORAL | Status: AC
  Administered 2012-08-19: 650 mg via ORAL

## 2012-08-19 MED ORDER — SODIUM CHLORIDE 0.9 % IV SOLN
6.0000 mg/kg | Freq: Once | INTRAVENOUS | Status: AC
  Administered 2012-08-19: 609 mg via INTRAVENOUS
  Filled 2012-08-19: qty 29

## 2012-08-19 MED ORDER — SODIUM CHLORIDE 0.9 % IV SOLN
Freq: Once | INTRAVENOUS | Status: AC
  Administered 2012-08-19: 11:00:00 via INTRAVENOUS

## 2012-08-19 MED ORDER — SODIUM CHLORIDE 0.9 % IJ SOLN
10.0000 mL | INTRAMUSCULAR | Status: DC | PRN
  Administered 2012-08-19: 10 mL
  Filled 2012-08-19: qty 10

## 2012-08-19 NOTE — Patient Instructions (Addendum)
Doing well.  Proceed with Herceptin.  I have referred you back to podiatry regarding your right great toenail.  We will see you back on 12/27, Please call us if you have any questions or concerns.

## 2012-08-19 NOTE — Telephone Encounter (Signed)
gve the pt her foot appt for today at the [facility] [place] for 2:00pm

## 2012-08-19 NOTE — Patient Instructions (Addendum)

## 2012-08-19 NOTE — Progress Notes (Signed)
OFFICE PROGRESS NOTE  CC [name] M.D. [name] M.D.  [facility], MD [address], [address] [place] [address]  DIAGNOSIS: 66 year old female with multicentric right breast cancer that was ER positive PR positive HER-2/neu positive clinical stage I.  PRIOR THERAPY:  #1 status post right simple mastectomy with right axillary sentinel node biopsy and left subclavian power port insertion on 06/26/2011.  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 Sentinel node biopsy done at the time of her mastectomy was negative for metastatic disease. Pathologic stage TI C. N0 M0, stage I breast cancer.  #3 patient has been receiving weekly Taxotere carboplatinum and Herceptin beginning November 2012. However she has had considerable side effects requiring discontinuation/holding of the chemotherapy.  #4 S/P Taxotere/Crboplatin q 3 weeks with weekly Herceptin beginning 09/30/11 x 3 cycles completed on 11/11/11  #5. Herceptin q 21 days begin 12/02/11  #6 adjuvant Aromasin 25 mg daily starting 02/05/2012.this was discontinued September 2013 do to incapacitating myalgias and arthralgias.  #7 tamoxifen 20 mg daily begun 05/27/2012.  CURRENT THERAPY: Herceptin q 21 days starting 12/02/11 , tamoxifen 20 mg daily starting 05/27/2012  INTERVAL HISTORY: [name] 66 y.o. female is here today for her next herceptin treatment.  She is feeling well.  She last had an echo on 11/20 which showed an EF of 45-50 %.  She was cleared by Dr. Gala Chandler to continue on Herceptin treatment.  Her last dose is on 12/27.  She's had some family issues at home as well as joint pain related to arthritis.  She takes a Tramadol at bedtime, but will wake up 4-5 hours later and is unable to get comfortable the rest of the night to sleep.  She is also concerned about her right great toe, she had to have it removed due to  an infection she developed during chemotherapy, however, a brown ridge has returned around the nail bed.  She'd like to see podiatry.  She continues to tolerate the Herceptin and Tamoxifen very well with no questions or concerns.    MEDICAL HISTORY: Past Medical History  Diagnosis Date  . Allergy   . Asthma   . Hypertension   . Hyperlipidemia   . Depression   . Fibromyalgia   . Arthritis     feet, knee and base of thumbs  . Osteopenia   . Carpal tunnel syndrome     right  . Breast cancer, stage 1 07/18/2011  . Dehydration 10/21/2011  . PONV (postoperative nausea and vomiting)     has had to use scop patch    ALLERGIES:   has no known allergies.  MEDICATIONS:  Current Outpatient Prescriptions  Medication Sig Dispense Refill  . alendronate (FOSAMAX) 70 MG tablet Take 70 mg by mouth every 7 (seven) days. Take with a full glass of water on an empty stomach.      Marland Kitchen atorvastatin (LIPITOR) 20 MG tablet Take 20 mg by mouth daily.        . calcium gluconate 500 MG tablet Take 500 mg by mouth daily.        . cholecalciferol (VITAMIN D) 1000 UNITS tablet Take 2,000 Units by mouth daily.        . DULoxetine (CYMBALTA) 60 MG capsule Take 120 mg by mouth daily.       Marland Kitchen gabapentin (NEURONTIN) 600 MG tablet Take 600 mg by mouth daily.       Marland Kitchen  hydrochlorothiazide (HYDRODIURIL) 25 MG tablet Take 25 mg by mouth daily.      [name] Oil 300 MG CAPS Take 1 capsule by mouth daily.        Marland Kitchen lidocaine-prilocaine (EMLA) cream Apply 1 application topically as needed.        Marland Kitchen lisinopril (PRINIVIL,ZESTRIL) 10 MG tablet Take 10 mg by mouth daily.      Marland Kitchen loratadine (CLARITIN) 10 MG tablet Take 10 mg by mouth daily.        . magnesium 30 MG tablet Take 500 mg by mouth daily.       . Multiple Vitamin (MULTIVITAMIN) capsule Take 1 capsule by mouth daily.        . tamoxifen (NOLVADEX) 20 MG tablet Take 20 mg by mouth Daily.      Marland Kitchen TAZORAC 0.1 % cream       . traMADol (ULTRAM) 50 MG tablet Take 1 tablet (50 mg  total) by mouth as needed for pain (PRN for osteoarthritis and fibromyalgia pain. ).  90 tablet  0  . VOLTAREN 1 % GEL       . [DISCONTINUED] diphenhydrAMINE (SOMINEX) 25 MG tablet Take 25 mg by mouth at bedtime as needed.          SURGICAL HISTORY:  Past Surgical History  Procedure Date  . Tonsillectomy 1950  . Tubal ligation 1978  . Abdominal hysterectomy 1981  . Right ear surgery 1983    tumor removed (?)  . Right breast lumpectomy 1986  . Ethmoidectomy 1991  . Knee arthroscopy 1996, 2001    left  . Facial plastic surgery 1996  . Bunionectomy 1998    left foot  . Total knee arthroplasty 2006    left  . Nasal sinus surgery 2001  . Lap band surgery 2010  . Right mastectomy 06-26-11  . Port-a-cath placement 06/26/11    tip in lower SVC per chest x-ray read by Dr. Dwyane Dee  . Mastopexy 04/13/2012    Procedure: MASTOPEXY;  Surgeon: Wayland Denis, DO;  Location: [facility];  Service: Plastics;  Laterality: Left;   left breast  mastopexy reduction for symmetry    Health Maintenance  Mammogram: 05/2011 Colonoscopy: 10 years ago Bone Density Scan: 05/2011 (osteopenia) Pap Smear: 05/2011 Eye Exam: 03/2012 Vitamin D Level: 06/2012-29 Lipid Panel: 06/2012   REVIEW OF SYSTEMS:   General: fatigue (-), night sweats (-), fever (-), pain (-) Lymph: palpable nodes (-) HEENT: vision changes (-), mucositis (-), gum bleeding (-), epistaxis (-) Cardiovascular: chest pain (-), palpitations (-) Pulmonary: shortness of breath (-), dyspnea on exertion (-), cough (-), hemoptysis (-) GI:  Early satiety (-), melena (-), dysphagia (-), nausea/vomiting (-), diarrhea (-) GU: dysuria (-), hematuria (-), incontinence (-) Musculoskeletal: joint swelling (-), joint pain (-), back pain (-) Neuro: weakness (-), numbness (-), headache (-), confusion (-) Skin: Rash (-), lesions (-), dryness (-) Psych: depression (-), suicidal/homicidal ideation (-), feeling of hopelessness  (-)   PHYSICAL EXAMINATION:  BP 125/76  Pulse 71  Temp 98.3 F (36.8 C) (Oral)  Resp 20  Ht 5' 3.5" (1.613 m)  Wt 234 lb 6.4 oz (106.323 kg)  BMI 40.87 kg/m2 General: Patient is a well appearing female in no acute distress HEENT: PERRLA, sclerae anicteric no conjunctival pallor, MMM Neck: supple, no palpable adenopathy Lungs: clear to auscultation bilaterally, no wheezes, rhonchi, or rales Cardiovascular: regular rate rhythm, S1, S2, no murmurs, rubs or gallops Abdomen: Soft, non-tender, non-distended, normoactive bowel sounds, no  HSM Extremities: warm and well perfused, no clubbing, cyanosis, or edema Skin: No rashes or lesions, right great toe with brown ridge at nail bed, no erythema or purulence Neuro: Non-focal Left breast no masses nipple discharge, scar from reduction healing well. Right mastectomy scar with implant is healing well. There still is some scar tissue formation and some scabbing. But there is no signs of infection ECOG PERFORMANCE STATUS: 0 - Asymptomatic  LABORATORY DATA: Lab Results  Component Value Date   WBC 6.5 08/19/2012   HGB 12.7 08/19/2012   HCT 38.2 08/19/2012   MCV 87.2 08/19/2012   PLT 203 08/19/2012      Chemistry      Component Value Date/Time   NA 137 07/29/2012 1102   NA 136 05/06/2012 1325   K 3.7 07/29/2012 1102   K 3.7 05/06/2012 1325   CL 103 07/29/2012 1102   CL 100 05/06/2012 1325   CO2 27 07/29/2012 1102   CO2 27 05/06/2012 1325   BUN 20.0 07/29/2012 1102   BUN 17 05/06/2012 1325   CREATININE 0.7 07/29/2012 1102   CREATININE 0.64 05/06/2012 1325      Component Value Date/Time   CALCIUM 9.6 07/29/2012 1102   CALCIUM 9.4 05/06/2012 1325   ALKPHOS 56 07/29/2012 1102   ALKPHOS 79 05/06/2012 1325   AST 20 07/29/2012 1102   AST 17 05/06/2012 1325   ALT 21 07/29/2012 1102   ALT 16 05/06/2012 1325   BILITOT 0.67 07/29/2012 1102   BILITOT 0.4 05/06/2012 1325       RADIOGRAPHIC STUDIES:  No results found.  ASSESSMENT: 66 year old  female with   #1 stage I HER-2/neu positive breast cancer. Her final stage was 1.9 cm high-grade poorly differentiated invasive ductal carcinoma no LVI ER positive PR positive HER-2/neu positive. She underwent a mastectomy. No sentinel nodes were involved with disease.   #2. Completed chemotherapy but continue Herceptin q 3 weeks to complete out one year of therapy. All of the Herceptin will be completed towards the end of December 2013.  #3 having significant problems with aches and pains most likely due to the Aromasin. This was discontinued.  #4 patient will remain off of Aromasin. I do not think that aromatase inhibitors are drugs of choice for her. I have not recommended that she begin tamoxifen 20 mg daily. Hopefully she will be able to tolerate this better. Risks and benefits including blood clots were discussed with her today. She knows to call me with any problems questions or concerns.  PLAN:   #1 Ms. Bogdanski will proceed with Herceptin today.  She is doing well.  We discussed stress management, sleep patterns, and if she wakes up in pain, she will take another Tramadol.    #2 She will continue tamoxifen 20 mg daily.  She is tolerating this well.    #3 We will refer her back to podiatry.   #4 We will see her back on 12/27 for her last dose of Herceptin.    I spent 25 minutes counseling the patient face to face. The total time spent in the appointment was 30 minutes.  This case was reviewed with Dr. Welton Flakes.    Cherie Ouch Lyn Hollingshead, NP Medical Oncology [facility] Phone: [phone]    08/19/2012, 10:56 AM

## 2012-09-09 ENCOUNTER — Ambulatory Visit (HOSPITAL_BASED_OUTPATIENT_CLINIC_OR_DEPARTMENT_OTHER): Payer: BC Managed Care – PPO

## 2012-09-09 ENCOUNTER — Encounter: Payer: Self-pay | Admitting: Adult Health

## 2012-09-09 ENCOUNTER — Ambulatory Visit (HOSPITAL_BASED_OUTPATIENT_CLINIC_OR_DEPARTMENT_OTHER): Payer: BC Managed Care – PPO | Admitting: Adult Health

## 2012-09-09 ENCOUNTER — Other Ambulatory Visit (HOSPITAL_BASED_OUTPATIENT_CLINIC_OR_DEPARTMENT_OTHER): Payer: BC Managed Care – PPO | Admitting: Lab

## 2012-09-09 VITALS — BP 123/74 | HR 72 | Temp 98.5°F | Resp 18

## 2012-09-09 DIAGNOSIS — C50919 Malignant neoplasm of unspecified site of unspecified female breast: Secondary | ICD-10-CM

## 2012-09-09 DIAGNOSIS — Z170 Estrogen receptor positive status [ER+]: Secondary | ICD-10-CM | POA: Diagnosis not present

## 2012-09-09 DIAGNOSIS — Z5112 Encounter for antineoplastic immunotherapy: Secondary | ICD-10-CM

## 2012-09-09 DIAGNOSIS — Z853 Personal history of malignant neoplasm of breast: Secondary | ICD-10-CM

## 2012-09-09 DIAGNOSIS — Z09 Encounter for follow-up examination after completed treatment for conditions other than malignant neoplasm: Secondary | ICD-10-CM

## 2012-09-09 LAB — CBC WITH DIFFERENTIAL/PLATELET
BASO%: 0.4 % (ref 0.0–2.0)
EOS%: 1.7 % (ref 0.0–7.0)
HCT: 39.3 % (ref 34.8–46.6)
LYMPH%: 37 % (ref 14.0–49.7)
MCH: 29.7 pg (ref 25.1–34.0)
MCHC: 33.8 g/dL (ref 31.5–36.0)
MCV: 87.7 fL (ref 79.5–101.0)
MONO%: 7.7 % (ref 0.0–14.0)
NEUT%: 53.2 % (ref 38.4–76.8)
Platelets: 201 10*3/uL (ref 145–400)
RBC: 4.48 10*6/uL (ref 3.70–5.45)
WBC: 8.3 10*3/uL (ref 3.9–10.3)
nRBC: 0 % (ref 0–0)

## 2012-09-09 LAB — BASIC METABOLIC PANEL (CC13)
BUN: 19 mg/dL (ref 7.0–26.0)
Calcium: 9.8 mg/dL (ref 8.4–10.4)
Chloride: 99 mEq/L (ref 98–107)
Creatinine: 0.8 mg/dL (ref 0.6–1.1)

## 2012-09-09 MED ORDER — TRASTUZUMAB CHEMO INJECTION 440 MG
6.0000 mg/kg | Freq: Once | INTRAVENOUS | Status: AC
  Administered 2012-09-09: 609 mg via INTRAVENOUS
  Filled 2012-09-09: qty 29

## 2012-09-09 MED ORDER — ACETAMINOPHEN 325 MG PO TABS
650.0000 mg | ORAL_TABLET | Freq: Once | ORAL | Status: AC
  Administered 2012-09-09: 650 mg via ORAL

## 2012-09-09 MED ORDER — SODIUM CHLORIDE 0.9 % IV SOLN
Freq: Once | INTRAVENOUS | Status: AC
  Administered 2012-09-09: 15:00:00 via INTRAVENOUS

## 2012-09-09 MED ORDER — HEPARIN SOD (PORK) LOCK FLUSH 100 UNIT/ML IV SOLN
500.0000 [IU] | Freq: Once | INTRAVENOUS | Status: AC | PRN
  Administered 2012-09-09: 500 [IU]
  Filled 2012-09-09: qty 5

## 2012-09-09 MED ORDER — SODIUM CHLORIDE 0.9 % IJ SOLN
10.0000 mL | INTRAMUSCULAR | Status: DC | PRN
  Administered 2012-09-09: 10 mL
  Filled 2012-09-09: qty 10

## 2012-09-09 MED ORDER — DIPHENHYDRAMINE HCL 25 MG PO CAPS: 50.0000 mg | ORAL_CAPSULE | Freq: Once | ORAL | Status: DC

## 2012-09-09 NOTE — Patient Instructions (Addendum)

## 2012-09-09 NOTE — Patient Instructions (Addendum)
Doing well.  Proceed with Herceptin.  Continue Tamoxifen.  We will see you back in 6 months.  Please call us if you have any questions or concerns.

## 2012-09-09 NOTE — Progress Notes (Signed)
OFFICE PROGRESS NOTE  CC [name] M.D. [name] M.D.  [facility], MD [address], [address] [place] [address]  DIAGNOSIS: 66 year old female with multicentric right breast cancer that was ER positive PR positive HER-2/neu positive clinical stage I.  PRIOR THERAPY:  #1 status post right simple mastectomy with right axillary sentinel node biopsy and left subclavian power port insertion on 06/26/2011.  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 Sentinel node biopsy done at the time of her mastectomy was negative for metastatic disease. Pathologic stage TI C. N0 M0, stage I breast cancer.  #3 patient has been receiving weekly Taxotere carboplatinum and Herceptin beginning November 2012. However she has had considerable side effects requiring discontinuation/holding of the chemotherapy.  #4 S/P Taxotere/Crboplatin q 3 weeks with weekly Herceptin beginning 09/30/11 x 3 cycles completed on 11/11/11  #5. Herceptin q 21 days begin 12/02/11  #6 adjuvant Aromasin 25 mg daily starting 02/05/2012.this was discontinued September 2013 do to incapacitating myalgias and arthralgias.  #7 tamoxifen 20 mg daily begun 05/27/2012.  CURRENT THERAPY: Herceptin q 21 days starting 12/02/11 , tamoxifen 20 mg daily starting 05/27/2012  INTERVAL HISTORY: [name] 66 y.o. female is here today for her last Herceptin treatment.  She's done well.  She continues to take her Tamoxifen and has occasional hot flashes at night.  Otherwise, she's doing well.  She needs a mammogram scheduled.    MEDICAL HISTORY: Past Medical History  Diagnosis Date  . Allergy   . Asthma   . Hypertension   . Hyperlipidemia   . Depression   . Fibromyalgia   . Arthritis     feet, knee and base of thumbs  . Osteopenia   . Carpal tunnel syndrome     right  . Breast cancer, stage 1 07/18/2011  . Dehydration  10/21/2011  . PONV (postoperative nausea and vomiting)     has had to use scop patch    ALLERGIES:   has no known allergies.  MEDICATIONS:  Current Outpatient Prescriptions  Medication Sig Dispense Refill  . alendronate (FOSAMAX) 70 MG tablet Take 70 mg by mouth every 7 (seven) days. Take with a full glass of water on an empty stomach.      Marland Kitchen atorvastatin (LIPITOR) 20 MG tablet Take 20 mg by mouth daily.        . calcium gluconate 500 MG tablet Take 500 mg by mouth daily.        . cholecalciferol (VITAMIN D) 1000 UNITS tablet Take 2,000 Units by mouth daily.        . DULoxetine (CYMBALTA) 60 MG capsule Take 120 mg by mouth daily.       Marland Kitchen gabapentin (NEURONTIN) 600 MG tablet Take 600 mg by mouth daily.       . hydrochlorothiazide (HYDRODIURIL) 25 MG tablet Take 25 mg by mouth daily.      Marland Kitchen lidocaine-prilocaine (EMLA) cream Apply 1 application topically as needed.        Marland Kitchen lisinopril (PRINIVIL,ZESTRIL) 10 MG tablet Take 10 mg by mouth daily.      Marland Kitchen loratadine (CLARITIN) 10 MG tablet Take 10 mg by mouth daily.        . magnesium 30 MG tablet Take 500 mg by mouth daily.       . Multiple Vitamin (MULTIVITAMIN) capsule Take 1 capsule by mouth daily.        . tamoxifen (  NOLVADEX) 20 MG tablet Take 20 mg by mouth Daily.      Marland Kitchen TAZORAC 0.1 % cream       . traMADol (ULTRAM) 50 MG tablet Take 1 tablet (50 mg total) by mouth as needed for pain (PRN for osteoarthritis and fibromyalgia pain. ).  90 tablet  0  . VOLTAREN 1 % GEL       . Krill Oil 300 MG CAPS Take 1 capsule by mouth daily.        . [DISCONTINUED] diphenhydrAMINE (SOMINEX) 25 MG tablet Take 25 mg by mouth at bedtime as needed.         No current facility-administered medications for this visit.   Facility-Administered Medications Ordered in Other Visits  Medication Dose Route Frequency Provider Last Rate Last Dose  . 0.9 %  sodium chloride infusion   Intravenous Once Victorino December, MD      . acetaminophen (TYLENOL) tablet 650 mg  650  mg Oral Once Victorino December, MD      . diphenhydrAMINE (BENADRYL) capsule 50 mg  50 mg Oral Once Victorino December, MD      . heparin lock flush 100 unit/mL  500 Units Intracatheter Once PRN Victorino December, MD      . sodium chloride 0.9 % injection 10 mL  10 mL Intracatheter PRN Victorino December, MD      . trastuzumab (HERCEPTIN) 609 mg in sodium chloride 0.9 % 250 mL chemo infusion  6 mg/kg (Treatment Plan Actual) Intravenous Once Victorino December, MD        SURGICAL HISTORY:  Past Surgical History  Procedure Date  . Tonsillectomy 1950  . Tubal ligation 1978  . Abdominal hysterectomy 1981  . Right ear surgery 1983    tumor removed (?)  . Right breast lumpectomy 1986  . Ethmoidectomy 1991  . Knee arthroscopy 1996, 2001    left  . Facial plastic surgery 1996  . Bunionectomy 1998    left foot  . Total knee arthroplasty 2006    left  . Nasal sinus surgery 2001  . Lap band surgery 2010  . Right mastectomy 06-26-11  . Port-a-cath placement 06/26/11    tip in lower SVC per chest x-ray read by Dr. Dwyane Dee  . Mastopexy 04/13/2012    Procedure: MASTOPEXY;  Surgeon: Wayland Denis, DO;  Location:  [facility];  Service: Plastics;  Laterality: Left;   left breast  mastopexy reduction for symmetry    Health Maintenance  Mammogram: 05/2011 Colonoscopy: 10 years ago Bone Density Scan: 05/2011 (osteopenia) Pap Smear: 05/2011 Eye Exam: 03/2012 Vitamin D Level: 06/2012-29 Lipid Panel: 06/2012   REVIEW OF SYSTEMS:   General: fatigue (-), night sweats (-), fever (-), pain (-) Lymph: palpable nodes (-) HEENT: vision changes (-), mucositis (-), gum bleeding (-), epistaxis (-) Cardiovascular: chest pain (-), palpitations (-) Pulmonary: shortness of breath (-), dyspnea on exertion (-), cough (-), hemoptysis (-) GI:  Early satiety (-), melena (-), dysphagia (-), nausea/vomiting (-), diarrhea (-) GU: dysuria (-), hematuria (-), incontinence (-) Musculoskeletal: joint swelling (-),  joint pain (-), back pain (-) Neuro: weakness (-), numbness (-), headache (-), confusion (-) Skin: Rash (-), lesions (-), dryness (-) Psych: depression (-), suicidal/homicidal ideation (-), feeling of hopelessness (-)   PHYSICAL EXAMINATION:  BP 123/74  Pulse 72  Temp 98.5 F (36.9 C) (Oral)  Resp 18 General: Patient is a well appearing female in no acute distress HEENT: PERRLA, sclerae anicteric no  conjunctival pallor, MMM Neck: supple, no palpable adenopathy Lungs: clear to auscultation bilaterally, no wheezes, rhonchi, or rales Cardiovascular: regular rate rhythm, S1, S2, no murmurs, rubs or gallops Abdomen: Soft, non-tender, non-distended, normoactive bowel sounds, no HSM Extremities: warm and well perfused, no clubbing, cyanosis, or edema Skin: No rashes or lesions Neuro: Non-focal Left breast no masses nipple discharge, scar from reduction healing well. Right mastectomy scar with implant is healing well. There still is some scar tissue formation and some scabbing. But there is no signs of infection ECOG PERFORMANCE STATUS: 0 - Asymptomatic  LABORATORY DATA: Lab Results  Component Value Date   WBC 8.3 09/09/2012   HGB 13.3 09/09/2012   HCT 39.3 09/09/2012   MCV 87.7 09/09/2012   PLT 201 09/09/2012      Chemistry      Component Value Date/Time   NA 136 08/19/2012 1013   NA 136 05/06/2012 1325   K 3.6 08/19/2012 1013   K 3.7 05/06/2012 1325   CL 101 08/19/2012 1013   CL 100 05/06/2012 1325   CO2 27 08/19/2012 1013   CO2 27 05/06/2012 1325   BUN 21.0 08/19/2012 1013   BUN 17 05/06/2012 1325   CREATININE 0.7 08/19/2012 1013   CREATININE 0.64 05/06/2012 1325      Component Value Date/Time   CALCIUM 9.0 08/19/2012 1013   CALCIUM 9.4 05/06/2012 1325   ALKPHOS 63 08/19/2012 1013   ALKPHOS 79 05/06/2012 1325   AST 18 08/19/2012 1013   AST 17 05/06/2012 1325   ALT 18 08/19/2012 1013   ALT 16 05/06/2012 1325   BILITOT 0.67 08/19/2012 1013   BILITOT 0.4 05/06/2012 1325        RADIOGRAPHIC STUDIES:  No results found.  ASSESSMENT: 66 year old female with   #1 stage I HER-2/neu positive breast cancer. Her final stage was 1.9 cm high-grade poorly differentiated invasive ductal carcinoma no LVI ER positive PR positive HER-2/neu positive. She underwent a mastectomy. No sentinel nodes were involved with disease.   #2. Completed chemotherapy but continue Herceptin q 3 weeks to complete out one year of therapy. All of the Herceptin will be completed towards the end of December 2013.  #3 having significant problems with aches and pains most likely due to the Aromasin. This was discontinued.  #4 patient will remain off of Aromasin. I do not think that aromatase inhibitors are drugs of choice for her. I have not recommended that she begin tamoxifen 20 mg daily. Hopefully she will be able to tolerate this better. Risks and benefits including blood clots were discussed with her today. She knows to call me with any problems questions or concerns.  PLAN:   #1 Ms. Lorenzi will proceed with Herceptin today.  She is doing well.   #2 She will continue tamoxifen 20 mg daily.  She is tolerating this well.    #3 We will see her back in 6 months for follow up.      I spent 25 minutes counseling the patient face to face. The total time spent in the appointment was 30 minutes.  This case was reviewed with Dr. Welton Flakes.    Cherie Ouch Lyn Hollingshead, NP Medical Oncology [facility] Phone: [phone]    09/09/2012, 3:18 PM

## 2012-10-14 ENCOUNTER — Ambulatory Visit: Payer: BC Managed Care – PPO

## 2012-11-02 ENCOUNTER — Ambulatory Visit: Payer: BC Managed Care – PPO

## 2012-11-22 ENCOUNTER — Ambulatory Visit: Payer: BC Managed Care – PPO

## 2012-11-25 ENCOUNTER — Encounter (HOSPITAL_COMMUNITY): Payer: Self-pay | Admitting: Cardiology

## 2013-01-13 ENCOUNTER — Ambulatory Visit: Payer: BC Managed Care – PPO

## 2013-01-17 ENCOUNTER — Ambulatory Visit: Payer: BC Managed Care – PPO | Attending: Family Medicine

## 2013-01-17 DIAGNOSIS — M6281 Muscle weakness (generalized): Secondary | ICD-10-CM | POA: Insufficient documentation

## 2013-01-17 DIAGNOSIS — R262 Difficulty in walking, not elsewhere classified: Secondary | ICD-10-CM | POA: Insufficient documentation

## 2013-01-17 DIAGNOSIS — M25569 Pain in unspecified knee: Secondary | ICD-10-CM | POA: Insufficient documentation

## 2013-01-17 DIAGNOSIS — IMO0001 Reserved for inherently not codable concepts without codable children: Secondary | ICD-10-CM | POA: Insufficient documentation

## 2013-01-23 ENCOUNTER — Ambulatory Visit: Payer: BC Managed Care – PPO | Admitting: Rehabilitation

## 2013-01-26 ENCOUNTER — Ambulatory Visit: Payer: BC Managed Care – PPO | Admitting: Rehabilitation

## 2013-01-30 ENCOUNTER — Ambulatory Visit (HOSPITAL_BASED_OUTPATIENT_CLINIC_OR_DEPARTMENT_OTHER)
Admission: RE | Admit: 2013-01-30 | Discharge: 2013-01-30 | Disposition: A | Discharge status: Home / Self Care | Payer: BC Managed Care – PPO | Source: Ambulatory Visit | Attending: Internal Medicine | Admitting: Internal Medicine

## 2013-01-30 ENCOUNTER — Ambulatory Visit (HOSPITAL_COMMUNITY)
Admission: RE | Admit: 2013-01-30 | Discharge: 2013-01-30 | Disposition: A | Discharge status: Home / Self Care | Payer: BC Managed Care – PPO | Source: Ambulatory Visit | Attending: Family Medicine | Admitting: Family Medicine

## 2013-01-30 ENCOUNTER — Encounter (HOSPITAL_COMMUNITY): Payer: Self-pay

## 2013-01-30 VITALS — BP 120/58 | HR 61 | Wt 238.0 lb

## 2013-01-30 DIAGNOSIS — I4949 Other premature depolarization: Secondary | ICD-10-CM

## 2013-01-30 DIAGNOSIS — Z09 Encounter for follow-up examination after completed treatment for conditions other than malignant neoplasm: Secondary | ICD-10-CM

## 2013-01-30 DIAGNOSIS — C50919 Malignant neoplasm of unspecified site of unspecified female breast: Secondary | ICD-10-CM | POA: Diagnosis not present

## 2013-01-30 DIAGNOSIS — I493 Ventricular premature depolarization: Secondary | ICD-10-CM | POA: Insufficient documentation

## 2013-01-30 DIAGNOSIS — I517 Cardiomegaly: Secondary | ICD-10-CM | POA: Insufficient documentation

## 2013-01-30 NOTE — Progress Notes (Signed)
Referring Physician: Drue Second ,MD Primary Physician: Mertha Finders, MD  HPI:  [name] is a 67 year old woman with history of multicentric ER/PR, HER2-positive right breast carcinoma, status post right simple mastectomy with axillary node dissection on 06/26/11 with final path revealing 1.9-cm invasive ductal carcinoma with a Cage ratio of 9.73, pathologic staging of T1c N0. She has hsitory of depression, HTN, Hyperlipidemia, and fibromyalgia.  Diagnosed with her breast CA in September 2012. In November, started Taxotere, carboplatinum and Herceptin.    11/12 ECHO  EF 55-60% grade I diastolic dysfunction Lateral s' 11.4 cm/s 11/18/11 ECHO EF 60% Lateral S' 11.7 03/15/12 ECHo EF 60% lateral S' 11.3 (reviewed and revised today 08/03/12) 08/03/12 ECHO EF 50-55% Lateral S' 11.5 01/30/13  EF 60-65%, lat s' 11.6 + frequent PVCs  She returns for follow up today.  Herceptin stopped in December 2013.  Denies SOB/PND/Orthonpea.  Doesn't think that she snores +fatigue.  States that her heart rate was in the 40s at one of her last doctor visits.  She denies dizziness or syncope.  No palpitations.  Review of Systems: All pertinent positives and negatives as in HPI, otherwise negative.     Past Medical History  Diagnosis Date  . Allergy   . Asthma   . Hypertension   . Hyperlipidemia   . Depression   . Fibromyalgia   . Arthritis     feet, knee and base of thumbs  . Osteopenia   . Carpal tunnel syndrome     right  . Breast cancer, stage 1 07/18/2011  . Dehydration 10/21/2011  . PONV (postoperative nausea and vomiting)     has had to use scop patch   Current Outpatient Prescriptions on File Prior to Encounter  Medication Sig Dispense Refill  . alendronate (FOSAMAX) 70 MG tablet Take 70 mg by mouth every 7 (seven) days. Take with a full glass of water on an empty stomach.      Marland Kitchen atorvastatin (LIPITOR) 20 MG tablet Take 20 mg by mouth daily.        . calcium gluconate 500 MG tablet Take 500 mg by mouth  daily.        . cholecalciferol (VITAMIN D) 1000 UNITS tablet Take 2,000 Units by mouth daily.        . DULoxetine (CYMBALTA) 60 MG capsule Take 120 mg by mouth daily.       Marland Kitchen gabapentin (NEURONTIN) 600 MG tablet Take 600 mg by mouth daily.       . hydrochlorothiazide (HYDRODIURIL) 25 MG tablet Take 25 mg by mouth daily.      Boris Lown Oil 300 MG CAPS Take 1 capsule by mouth daily.        Marland Kitchen lisinopril (PRINIVIL,ZESTRIL) 10 MG tablet Take 10 mg by mouth daily.      . magnesium 30 MG tablet Take 500 mg by mouth daily.       . Multiple Vitamin (MULTIVITAMIN) capsule Take 1 capsule by mouth daily.        . tamoxifen (NOLVADEX) 20 MG tablet Take 20 mg by mouth Daily.      Marland Kitchen TAZORAC 0.1 % cream       . traMADol (ULTRAM) 50 MG tablet Take 1 tablet (50 mg total) by mouth as needed for pain (PRN for osteoarthritis and fibromyalgia pain. ).  90 tablet  0  . VOLTAREN 1 % GEL       . [DISCONTINUED] diphenhydrAMINE (SOMINEX) 25 MG tablet Take 25 mg by mouth at bedtime  as needed.         No current facility-administered medications on file prior to encounter.     No Known Allergies  History   Social History  . Marital Status: Single    Spouse Name: N/A    Number of Children: N/A  . Years of Education: N/A   Occupational History  . Not on file.   Social History Main Topics  . Smoking status: Never Smoker   . Smokeless tobacco: Not on file  . Alcohol Use: 0.6 oz/week    1 Glasses of wine per week  . Drug Use: Not on file  . Sexually Active: Not Currently   Other Topics Concern  . Not on file   Social History Narrative  . No narrative on file    Family History  Problem Relation Age of Onset  . Breast cancer Paternal Aunt   . Breast cancer Paternal Grandmother     PHYSICAL EXAM: Filed Vitals:   01/30/13 1455  BP: 120/58  Pulse: 61   General:NAD  No respiratory difficulty HEENT: normal Neck: supple. no JVD. Carotids 2+ bilat; no bruits. No lymphadenopathy or thryomegaly  appreciated. Cor: PMI nondisplaced. Mildly irregular. No rubs, gallops or murmurs. Lungs: clear Abdomen: soft, nontender, nondistended. No hepatosplenomegaly. No bruits or masses. Good bowel sounds. Extremities: no cyanosis, clubbing, rash, edema RLE ankle brace Neuro: alert & oriented x 3, cranial nerves grossly intact. moves all 4 extremities w/o difficulty. Affect pleasant.   ASSESSMENT/PLAN:

## 2013-01-30 NOTE — Assessment & Plan Note (Signed)
These are chronic for her and relatively asymptomatic. I explained that when her cuff detects a low HR it is likely because it is not picking up PVCs which are mostly non-perfused beats. Suggested considering sleep study or overnight oximetry to exclude OSA. Minimize caffeine. She will f/u with her PCP.

## 2013-01-30 NOTE — Assessment & Plan Note (Signed)
I reviewed echos personally. EF and Doppler parameters stable. No HF on exam. She has completed Herceptin. F/u PRN.   

## 2013-01-30 NOTE — Progress Notes (Signed)
  Echocardiogram 2D Echocardiogram has been performed.  Jorje Guild 01/30/2013, 2:49 PM

## 2013-01-31 ENCOUNTER — Ambulatory Visit: Payer: BC Managed Care – PPO | Admitting: Rehabilitation

## 2013-02-02 ENCOUNTER — Other Ambulatory Visit (HOSPITAL_COMMUNITY): Payer: BC Managed Care – PPO

## 2013-02-02 ENCOUNTER — Encounter (HOSPITAL_COMMUNITY): Payer: BC Managed Care – PPO

## 2013-02-02 ENCOUNTER — Ambulatory Visit: Payer: BC Managed Care – PPO

## 2013-02-10 ENCOUNTER — Other Ambulatory Visit: Payer: Self-pay | Admitting: Emergency Medicine

## 2013-02-13 ENCOUNTER — Ambulatory Visit: Payer: BC Managed Care – PPO | Admitting: Rehabilitation

## 2013-02-13 ENCOUNTER — Telehealth (INDEPENDENT_AMBULATORY_CARE_PROVIDER_SITE_OTHER): Payer: Self-pay | Admitting: General Surgery

## 2013-02-13 ENCOUNTER — Other Ambulatory Visit (INDEPENDENT_AMBULATORY_CARE_PROVIDER_SITE_OTHER): Payer: Self-pay | Admitting: General Surgery

## 2013-02-13 NOTE — Telephone Encounter (Signed)
Spoke with patient and she is okay with proceeding with PAC removal without a preop appt. She would like to coordinate this with Dr Kelly Splinter who is doing additional reconstructive surgery. Dr Dwain Sarna- please send orders to scheduling.

## 2013-02-13 NOTE — Telephone Encounter (Signed)
Message copied by Liliana Cline on Mon Feb 13, 2013 11:29 AM ------      Message from: Dwain Sarna, MATTHEW      Created: Mon Feb 13, 2013 11:03 AM       Lesly Rubenstein      Would you ask if she wants to see me first or if we can just schedule without seeing her?  I can either do just local or under iv sedation.        MW      ----- Message -----         From: Jasmine Pang, RN         Sent: 02/10/2013   1:43 PM           To: [name], MD            Dr Dwain Sarna,             Per Dr Welton Flakes, can you please set up Ms Mullenax for port removal?            Patient states due to insurance purposes she needs to have this done before 03/13/13.             Thanks            Sharyl Nimrod       ------

## 2013-02-15 ENCOUNTER — Encounter: Payer: BC Managed Care – PPO | Admitting: Rehabilitation

## 2013-02-20 ENCOUNTER — Ambulatory Visit
Admission: RE | Admit: 2013-02-20 | Discharge: 2013-02-20 | Disposition: A | Discharge status: Home / Self Care | Payer: BC Managed Care – PPO | Source: Ambulatory Visit | Attending: Adult Health | Admitting: Adult Health

## 2013-02-20 DIAGNOSIS — Z9882 Breast implant status: Secondary | ICD-10-CM

## 2013-02-20 DIAGNOSIS — C50919 Malignant neoplasm of unspecified site of unspecified female breast: Secondary | ICD-10-CM

## 2013-02-20 DIAGNOSIS — Z9011 Acquired absence of right breast and nipple: Secondary | ICD-10-CM

## 2013-02-22 ENCOUNTER — Encounter: Payer: BC Managed Care – PPO | Admitting: Rehabilitation

## 2013-02-23 ENCOUNTER — Other Ambulatory Visit: Payer: Self-pay | Admitting: Plastic Surgery

## 2013-02-23 ENCOUNTER — Encounter (HOSPITAL_BASED_OUTPATIENT_CLINIC_OR_DEPARTMENT_OTHER): Payer: Self-pay | Admitting: *Deleted

## 2013-02-23 DIAGNOSIS — T8544XA Capsular contracture of breast implant, initial encounter: Secondary | ICD-10-CM

## 2013-02-23 NOTE — Progress Notes (Signed)
02/23/13 1619  OBSTRUCTIVE SLEEP APNEA  Have you ever been diagnosed with sleep apnea through a sleep study? No  Do you snore loudly (loud enough to be heard through closed doors)?  1  Do you often feel tired, fatigued, or sleepy during the daytime? 0  Has anyone observed you stop breathing during your sleep? 0  Do you have, or are you being treated for high blood pressure? 1  BMI more than 35 kg/m2? 1  Age over 67 years old? 1  Gender: 0  Obstructive Sleep Apnea Score 4  Score 4 or greater  Results sent to PCP

## 2013-02-23 NOTE — H&P (Signed)
  This document contains confidential information from a [facility] medical record system and may be unauthenticated. Release may be made only with a valid authorization or in accordance with applicable policies of Medical Center or its affiliates. This document must be maintained in a secure manner or discarded/destroyed as required by Medical Center policy or by a confidential means such as shredding.     [facility] [place]  01/03/2013 2:45 PM   Office Visit  MRN:  [id number]  Department: Plastic Surgery  Dept Phone: [phone]  Description: Female DOB: 1946/06/25  Provider: Wayland Denis, DO    Diagnoses   Capsular contracture of breast implant, initial encounter    -  Primary   611.83     Reason for Visit   Breast Reconstruction       Dx: Capsular contracture of breast implant [611.83]     Vitals - Last Recorded    153/71  45  1.626 m (5' 4.02")  97.977 kg (216 lb)  37.06 kg/m2       Subjective:    Patient ID: [name] is a 67 y.o. female.  HPI The patient is a 67 yrs old wf here for post operative follow up after left mastopexy/reduction with right expander removal capsulectomy and implant placement 4 months ago. She had a right mastectomy with SLN and immediate reconstruction with expander/FlexHD placement on 06/26/11. 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 No radiation is planned. She has not had any fevers or signs of infection. The expander is in place but deflated again. The incision is healing well and has not had any drainage. The bruising and swelling of the left breast area has resolved. The skin flaps are viable with good capillary refill. She is very pleased with the left breast.  The right breast seems to have a tight area laterally which is concerning for a capsular contracture.  There is a dog ear on the medial aspect that can be better contoured.  The following  portions of the patient's history were reviewed and updated as appropriate: allergies, current medications, past family history, past medical history, past social history, past surgical history and problem list.  Review of Systems  All other systems reviewed and are negative.     Objective:    Physical Exam  Constitutional: She appears well-developed and well-nourished.  HENT:   Head: Normocephalic and atraumatic.  Eyes: Conjunctivae are normal. Pupils are equal, round, and reactive to light.  Cardiovascular: Normal rate.   Pulmonary/Chest: Effort normal.  Skin: Skin is warm.  Psychiatric: She has a normal mood and affect. Her behavior is normal.     Assessment:   1.  Capsular contracture of breast implant, initial encounter        Plan:     Recommend release of the capsular contracture with a capsulectomy and possible repositioning of the implant more medially with excision of excess tissue on the medial aspect.

## 2013-02-23 NOTE — Progress Notes (Signed)
Pt was here 7/13 for her tissue exp out and implant and lt mastoplexi Did well Dr Dwain Sarna is taking pac out and she is having implant adjusted-she talked to dr bensimone about a sleep study, but is not doing it-she has not had post op problems-to come in for bmet-will get ekg

## 2013-02-24 ENCOUNTER — Ambulatory Visit: Payer: BC Managed Care – PPO | Attending: Family Medicine

## 2013-02-24 DIAGNOSIS — M25569 Pain in unspecified knee: Secondary | ICD-10-CM | POA: Insufficient documentation

## 2013-02-24 DIAGNOSIS — R262 Difficulty in walking, not elsewhere classified: Secondary | ICD-10-CM | POA: Insufficient documentation

## 2013-02-24 DIAGNOSIS — M6281 Muscle weakness (generalized): Secondary | ICD-10-CM | POA: Insufficient documentation

## 2013-02-24 DIAGNOSIS — IMO0001 Reserved for inherently not codable concepts without codable children: Secondary | ICD-10-CM | POA: Insufficient documentation

## 2013-02-27 ENCOUNTER — Ambulatory Visit: Payer: BC Managed Care – PPO | Admitting: Rehabilitation

## 2013-03-01 ENCOUNTER — Encounter (HOSPITAL_BASED_OUTPATIENT_CLINIC_OR_DEPARTMENT_OTHER)
Admission: RE | Admit: 2013-03-01 | Discharge: 2013-03-01 | Disposition: A | Discharge status: Home / Self Care | Payer: BC Managed Care – PPO | Source: Ambulatory Visit | Attending: Plastic Surgery | Admitting: Plastic Surgery

## 2013-03-01 ENCOUNTER — Ambulatory Visit: Payer: BC Managed Care – PPO | Admitting: Rehabilitation

## 2013-03-01 LAB — BASIC METABOLIC PANEL
GFR calc Af Amer: >90 mL/min (ref >90)
GFR calc non Af Amer: 86 mL/min — ABNORMAL LOW (ref >90)
Glucose, Bld: 113 mg/dL — ABNORMAL HIGH (ref 70–99)
Potassium: 4 mEq/L (ref 3.5–5.1)
Sodium: 135 mEq/L (ref 135–145)

## 2013-03-02 ENCOUNTER — Encounter (HOSPITAL_BASED_OUTPATIENT_CLINIC_OR_DEPARTMENT_OTHER): Payer: Self-pay | Admitting: *Deleted

## 2013-03-02 ENCOUNTER — Ambulatory Visit (HOSPITAL_BASED_OUTPATIENT_CLINIC_OR_DEPARTMENT_OTHER)
Admission: RE | Admit: 2013-03-02 | Discharge: 2013-03-02 | Disposition: A | Discharge status: Home / Self Care | Payer: BC Managed Care – PPO | Source: Ambulatory Visit | Attending: Plastic Surgery | Admitting: Plastic Surgery

## 2013-03-02 ENCOUNTER — Encounter (HOSPITAL_BASED_OUTPATIENT_CLINIC_OR_DEPARTMENT_OTHER)
Admission: RE | Disposition: A | Discharge status: Home / Self Care | Payer: Self-pay | Source: Ambulatory Visit | Attending: Plastic Surgery

## 2013-03-02 ENCOUNTER — Encounter (HOSPITAL_BASED_OUTPATIENT_CLINIC_OR_DEPARTMENT_OTHER): Payer: Self-pay | Admitting: Anesthesiology

## 2013-03-02 ENCOUNTER — Ambulatory Visit (HOSPITAL_BASED_OUTPATIENT_CLINIC_OR_DEPARTMENT_OTHER): Payer: BC Managed Care – PPO | Admitting: Anesthesiology

## 2013-03-02 DIAGNOSIS — M129 Arthropathy, unspecified: Secondary | ICD-10-CM | POA: Insufficient documentation

## 2013-03-02 DIAGNOSIS — F3289 Other specified depressive episodes: Secondary | ICD-10-CM | POA: Insufficient documentation

## 2013-03-02 DIAGNOSIS — Z452 Encounter for adjustment and management of vascular access device: Secondary | ICD-10-CM | POA: Insufficient documentation

## 2013-03-02 DIAGNOSIS — L905 Scar conditions and fibrosis of skin: Secondary | ICD-10-CM | POA: Diagnosis not present

## 2013-03-02 DIAGNOSIS — G709 Myoneural disorder, unspecified: Secondary | ICD-10-CM | POA: Insufficient documentation

## 2013-03-02 DIAGNOSIS — T8544XA Capsular contracture of breast implant, initial encounter: Secondary | ICD-10-CM | POA: Insufficient documentation

## 2013-03-02 DIAGNOSIS — N650 Deformity of reconstructed breast: Secondary | ICD-10-CM | POA: Diagnosis not present

## 2013-03-02 DIAGNOSIS — F329 Major depressive disorder, single episode, unspecified: Secondary | ICD-10-CM | POA: Insufficient documentation

## 2013-03-02 DIAGNOSIS — Z853 Personal history of malignant neoplasm of breast: Secondary | ICD-10-CM | POA: Insufficient documentation

## 2013-03-02 DIAGNOSIS — J45909 Unspecified asthma, uncomplicated: Secondary | ICD-10-CM | POA: Insufficient documentation

## 2013-03-02 DIAGNOSIS — IMO0001 Reserved for inherently not codable concepts without codable children: Secondary | ICD-10-CM | POA: Insufficient documentation

## 2013-03-02 DIAGNOSIS — I10 Essential (primary) hypertension: Secondary | ICD-10-CM | POA: Insufficient documentation

## 2013-03-02 DIAGNOSIS — N651 Disproportion of reconstructed breast: Secondary | ICD-10-CM | POA: Insufficient documentation

## 2013-03-02 HISTORY — DX: Acne, unspecified: L70.9

## 2013-03-02 HISTORY — DX: Allergy status to unspecified drugs, medicaments and biological substances: Z88.9

## 2013-03-02 HISTORY — PX: LIPOSUCTION: SHX10

## 2013-03-02 HISTORY — PX: BREAST CAPSULECTOMY WITH IMPLANT EXCHANGE: SHX5592

## 2013-03-02 HISTORY — PX: PORT-A-CATH REMOVAL: SHX5289

## 2013-03-02 SURGERY — CAPSULECTOMY, BREAST, WITH REPLACEMENT OF IMPLANT
Anesthesia: General | Site: Chest | Laterality: Right | Wound class: Clean

## 2013-03-02 MED ORDER — OXYCODONE HCL 5 MG/5ML PO SOLN: 5.0000 mg | Freq: Once | ORAL | Status: DC | PRN

## 2013-03-02 MED ORDER — SODIUM BICARBONATE 4 % IV SOLN
INTRAVENOUS | Status: DC | PRN
  Administered 2013-03-02: 11:00:00 via INTRAMUSCULAR

## 2013-03-02 MED ORDER — EPHEDRINE SULFATE 50 MG/ML IJ SOLN
INTRAMUSCULAR | Status: DC | PRN
  Administered 2013-03-02 (×3): 10 mg via INTRAVENOUS

## 2013-03-02 MED ORDER — FENTANYL CITRATE 0.05 MG/ML IJ SOLN
25.0000 ug | INTRAMUSCULAR | Status: DC | PRN
  Administered 2013-03-02 (×2): 25 ug via INTRAVENOUS

## 2013-03-02 MED ORDER — CEFAZOLIN SODIUM-DEXTROSE 2-3 GM-% IV SOLR
2.0000 g | INTRAVENOUS | Status: AC
  Administered 2013-03-02: 2 g via INTRAVENOUS

## 2013-03-02 MED ORDER — SCOPOLAMINE 1 MG/3DAYS TD PT72
MEDICATED_PATCH | TRANSDERMAL | Status: DC | PRN
  Administered 2013-03-02: 1.5 mg via TRANSDERMAL

## 2013-03-02 MED ORDER — ONDANSETRON HCL 4 MG/2ML IJ SOLN
INTRAMUSCULAR | Status: DC | PRN
  Administered 2013-03-02: 4 mg via INTRAVENOUS

## 2013-03-02 MED ORDER — LIDOCAINE HCL (CARDIAC) 20 MG/ML IV SOLN
INTRAVENOUS | Status: DC | PRN
  Administered 2013-03-02: 75 mg via INTRAVENOUS

## 2013-03-02 MED ORDER — BUPIVACAINE HCL (PF) 0.25 % IJ SOLN
INTRAMUSCULAR | Status: DC | PRN
  Administered 2013-03-02: 8 mL

## 2013-03-02 MED ORDER — DEXAMETHASONE SODIUM PHOSPHATE 4 MG/ML IJ SOLN
INTRAMUSCULAR | Status: DC | PRN
  Administered 2013-03-02: 10 mg via INTRAVENOUS

## 2013-03-02 MED ORDER — ACETAMINOPHEN 40 MG HALF SUPP
RECTAL | Status: DC | PRN
  Administered 2013-03-02: 1000 mg via RECTAL

## 2013-03-02 MED ORDER — OXYCODONE HCL 5 MG PO TABS: 5.0000 mg | ORAL_TABLET | Freq: Once | ORAL | Status: DC | PRN

## 2013-03-02 MED ORDER — ONDANSETRON HCL 4 MG/2ML IJ SOLN: 4.0000 mg | Freq: Four times a day (QID) | INTRAMUSCULAR | Status: DC | PRN

## 2013-03-02 MED ORDER — PROPOFOL 10 MG/ML IV BOLUS
INTRAVENOUS | Status: DC | PRN
  Administered 2013-03-02: 200 mg via INTRAVENOUS

## 2013-03-02 MED ORDER — LACTATED RINGERS IV SOLN
INTRAVENOUS | Status: DC
  Administered 2013-03-02 (×2): via INTRAVENOUS

## 2013-03-02 MED ORDER — SODIUM CHLORIDE 0.9 % IR SOLN
Status: DC | PRN
  Administered 2013-03-02: 11:00:00

## 2013-03-02 MED ORDER — SUFENTANIL CITRATE 50 MCG/ML IV SOLN
INTRAVENOUS | Status: DC | PRN
  Administered 2013-03-02 (×2): 10 ug via INTRAVENOUS
  Administered 2013-03-02: 5 ug via INTRAVENOUS

## 2013-03-02 SURGICAL SUPPLY — 74 items
BAG DECANTER FOR FLEXI CONT (MISCELLANEOUS) ×5 IMPLANT
BANDAGE GAUZE ELAST BULKY 4 IN (GAUZE/BANDAGES/DRESSINGS) ×10 IMPLANT
BINDER BREAST LRG (GAUZE/BANDAGES/DRESSINGS) IMPLANT
BINDER BREAST MEDIUM (GAUZE/BANDAGES/DRESSINGS) IMPLANT
BINDER BREAST XLRG (GAUZE/BANDAGES/DRESSINGS) ×10 IMPLANT
BINDER BREAST XXLRG (GAUZE/BANDAGES/DRESSINGS) ×5 IMPLANT
BIOPATCH RED 1 DISK 7.0 (GAUZE/BANDAGES/DRESSINGS) IMPLANT
BLADE HEX COATED 2.75 (ELECTRODE) ×5 IMPLANT
BLADE SURG 15 STRL LF DISP TIS (BLADE) ×8 IMPLANT
BLADE SURG 15 STRL SS (BLADE) ×2
BLADE SURG ROTATE 9660 (MISCELLANEOUS) IMPLANT
CANISTER LIPO FAT HARVEST (MISCELLANEOUS) ×5 IMPLANT
CANISTER SUCTION 1200CC (MISCELLANEOUS) ×5 IMPLANT
CANNULA ASPIRATION (CANNULA) ×5 IMPLANT
CHLORAPREP W/TINT 26ML (MISCELLANEOUS) ×10 IMPLANT
CLOTH BEACON ORANGE TIMEOUT ST (SAFETY) ×10 IMPLANT
CORDS BIPOLAR (ELECTRODE) IMPLANT
COVER MAYO STAND STRL (DRAPES) ×10 IMPLANT
COVER TABLE BACK 60X90 (DRAPES) ×10 IMPLANT
DECANTER SPIKE VIAL GLASS SM (MISCELLANEOUS) ×5 IMPLANT
DERMABOND ADVANCED (GAUZE/BANDAGES/DRESSINGS) ×2
DERMABOND ADVANCED .7 DNX12 (GAUZE/BANDAGES/DRESSINGS) ×8 IMPLANT
DRAIN CHANNEL 19F RND (DRAIN) IMPLANT
DRAPE LAPAROSCOPIC ABDOMINAL (DRAPES) ×5 IMPLANT
DRAPE PED LAPAROTOMY (DRAPES) ×10 IMPLANT
DRSG PAD ABDOMINAL 8X10 ST (GAUZE/BANDAGES/DRESSINGS) ×5 IMPLANT
DRSG TEGADERM 2-3/8X2-3/4 SM (GAUZE/BANDAGES/DRESSINGS) IMPLANT
ELECT BLADE 4.0 EZ CLEAN MEGAD (MISCELLANEOUS) ×5
ELECT BLADE 6.5 .24CM SHAFT (ELECTRODE) IMPLANT
ELECT COATED BLADE 2.86 ST (ELECTRODE) ×5 IMPLANT
ELECT REM PT RETURN 9FT ADLT (ELECTROSURGICAL) ×10
ELECTRODE BLDE 4.0 EZ CLN MEGD (MISCELLANEOUS) ×4 IMPLANT
ELECTRODE REM PT RTRN 9FT ADLT (ELECTROSURGICAL) ×8 IMPLANT
EVACUATOR SILICONE 100CC (DRAIN) IMPLANT
FILTER LIPOSUCTION (MISCELLANEOUS) ×5 IMPLANT
GAUZE SPONGE 4X4 12PLY STRL LF (GAUZE/BANDAGES/DRESSINGS) IMPLANT
GLOVE BIO SURGEON STRL SZ 6.5 (GLOVE) ×25 IMPLANT
GLOVE BIO SURGEON STRL SZ7 (GLOVE) ×5 IMPLANT
GOWN PREVENTION PLUS XLARGE (GOWN DISPOSABLE) ×25 IMPLANT
IV NS 1000ML (IV SOLUTION)
IV NS 1000ML BAXH (IV SOLUTION) IMPLANT
IV NS 500ML (IV SOLUTION)
IV NS 500ML BAXH (IV SOLUTION) IMPLANT
KIT FILL SYSTEM UNIVERSAL (SET/KITS/TRAYS/PACK) ×5 IMPLANT
NEEDLE 27GAX1X1/2 (NEEDLE) ×5 IMPLANT
NEEDLE HYPO 25X1 1.5 SAFETY (NEEDLE) ×10 IMPLANT
NEEDLE SPNL 18GX3.5 QUINCKE PK (NEEDLE) ×5 IMPLANT
NS IRRIG 1000ML POUR BTL (IV SOLUTION) IMPLANT
PACK BASIN DAY SURGERY FS (CUSTOM PROCEDURE TRAY) ×10 IMPLANT
PENCIL BUTTON HOLSTER BLD 10FT (ELECTRODE) ×10 IMPLANT
PIN SAFETY STERILE (MISCELLANEOUS) IMPLANT
SLEEVE SCD COMPRESS KNEE MED (MISCELLANEOUS) ×5 IMPLANT
SPONGE GAUZE 2X2 8PLY STRL LF (GAUZE/BANDAGES/DRESSINGS) IMPLANT
SPONGE LAP 18X18 X RAY DECT (DISPOSABLE) ×10 IMPLANT
STRIP CLOSURE SKIN 1/2X4 (GAUZE/BANDAGES/DRESSINGS) IMPLANT
SUT MNCRL AB 4-0 PS2 18 (SUTURE) ×5 IMPLANT
SUT MON AB 4-0 PC3 18 (SUTURE) IMPLANT
SUT MON AB 5-0 PS2 18 (SUTURE) ×15 IMPLANT
SUT PDS 3-0 CT2 (SUTURE)
SUT PDS AB 2-0 CT2 27 (SUTURE) ×5 IMPLANT
SUT PDS II 3-0 CT2 27 ABS (SUTURE) IMPLANT
SUT VIC AB 3-0 SH 27 (SUTURE) ×3
SUT VIC AB 3-0 SH 27X BRD (SUTURE) ×12 IMPLANT
SUT VICRYL 4-0 PS2 18IN ABS (SUTURE) ×15 IMPLANT
SYR 50ML LL SCALE MARK (SYRINGE) ×10 IMPLANT
SYR BULB IRRIGATION 50ML (SYRINGE) ×5 IMPLANT
SYR CONTROL 10ML LL (SYRINGE) ×10 IMPLANT
TOWEL OR 17X24 6PK STRL BLUE (TOWEL DISPOSABLE) ×15 IMPLANT
TOWEL OR NON WOVEN STRL DISP B (DISPOSABLE) ×5 IMPLANT
TRAY DSU PREP LF (CUSTOM PROCEDURE TRAY) ×5 IMPLANT
TUBE CONNECTING 20X1/4 (TUBING) ×5 IMPLANT
TUBING SET GRADUATE ASPIR 12FT (MISCELLANEOUS) ×5 IMPLANT
UNDERPAD 30X30 INCONTINENT (UNDERPADS AND DIAPERS) ×10 IMPLANT
YANKAUER SUCT BULB TIP NO VENT (SUCTIONS) ×5 IMPLANT

## 2013-03-02 NOTE — Anesthesia Preprocedure Evaluation (Signed)
Anesthesia Evaluation  Patient identified by MRN, date of birth, ID band Patient awake    Reviewed: Allergy & Precautions, H&P , NPO status , Patient's Chart, lab work & pertinent test results  History of Anesthesia Complications (+) PONV  Airway Mallampati: II  Neck ROM: full    Dental   Pulmonary asthma ,          Cardiovascular hypertension,     Neuro/Psych Depression  Neuromuscular disease    GI/Hepatic   Endo/Other  Morbid obesity  Renal/GU      Musculoskeletal  (+) Arthritis -, Fibromyalgia -  Abdominal   Peds  Hematology   Anesthesia Other Findings   Reproductive/Obstetrics                           Anesthesia Physical Anesthesia Plan  ASA: III  Anesthesia Plan: General   Post-op Pain Management:    Induction: Intravenous  Airway Management Planned: LMA  Additional Equipment:   Intra-op Plan:   Post-operative Plan:   Informed Consent: I have reviewed the patients History and Physical, chart, labs and discussed the procedure including the risks, benefits and alternatives for the proposed anesthesia with the patient or authorized representative who has indicated his/her understanding and acceptance.     Plan Discussed with: CRNA, Anesthesiologist and Surgeon  Anesthesia Plan Comments:         Anesthesia Quick Evaluation

## 2013-03-02 NOTE — Interval H&P Note (Signed)
History and Physical Interval Note:  03/02/2013 10:08 AM  [name]  has presented today for surgery, with the diagnosis of HISTORY OF BREAST CANCER  The various methods of treatment have been discussed with the patient and family. After consideration of risks, benefits and other options for treatment, the patient has consented to  Procedure(s): BREAST CAPSULECTOMY WITH REPOSITIONING THE IMPLANT (Right) REMOVAL PORT-A-CATH (N/A) REMOVAL PORT-A-CATH (N/A) as a surgical intervention .  The patient's history has been reviewed, patient examined, no change in status, stable for surgery.  I have reviewed the patient's chart and labs.  Questions were answered to the patient's satisfaction.     Unnamed Hino

## 2013-03-02 NOTE — Op Note (Addendum)
Preoperative diagnosis: History of breast cancer status post treatment, No longer needs venous access Postoperative diagnosis: Same as above Procedure: Left subclavian port removal Surgeon: Dr. Harden Mo Anesthesia: Gen. Specimens: None Drains: None Estimated blood loss: Minimal Sponge count corred Disposition patient turned over to plastic surgery  Indications: This is a 67 year old female who I know well from her treatment for breast cancer. She no longer needs her port and we discussed before removal as well as a combined procedure with plastic surgery.  Procedure: After informed consent was obtained the patient was taken to the operating room. She underwent administration of antibiotics per Dr. Kelly Splinter. She had sequential compression devices on for DVT prophylaxis. She was placed under general anesthesia without complication. Her chest was prepped and draped in the standard sterile surgical fashion. Surgical timeout was performed.  I infiltrated Marcaine throughout her old incision. I then reentered this incision and remove her port in its entirety. This was hemostatic. I closed this with 3-0 Vicryl, 4-0 Monocryl, and Dermabond. The case was then turned over to Dr. Kelly Splinter.

## 2013-03-02 NOTE — H&P (View-Only) (Signed)
  This document contains confidential information from a [facility] medical record system and may be unauthenticated. Release may be made only with a valid authorization or in accordance with applicable policies of Medical Center or its affiliates. This document must be maintained in a secure manner or discarded/destroyed as required by [facility] Center policy or by a confidential means such as shredding.     [name]  01/03/2013 2:45 PM   Office Visit  MRN:  [id number]  Department: Plastic Surgery  Dept Phone: [phone]  Description: Female DOB: 08/27/1946  Provider: Emileigh Kellett Sanger, DO    Diagnoses   Capsular contracture of breast implant, initial encounter    -  Primary   611.83     Reason for Visit   Breast Reconstruction       Dx: Capsular contracture of breast implant [611.83]     Vitals - Last Recorded    153/71  45  1.626 m (5' 4.02")  97.977 kg (216 lb)  37.06 kg/m2       Subjective:    Patient ID: [name] is a 66 y.o. female.  HPI The patient is a 66 yrs old wf here for post operative follow up after left mastopexy/reduction with right expander removal capsulectomy and implant placement 4 months ago. She had a right mastectomy with SLN and immediate reconstruction with expander/FlexHD placement on 06/26/11. 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 No radiation is planned. She has not had any fevers or signs of infection. The expander is in place but deflated again. The incision is healing well and has not had any drainage. The bruising and swelling of the left breast area has resolved. The skin flaps are viable with good capillary refill. She is very pleased with the left breast.  The right breast seems to have a tight area laterally which is concerning for a capsular contracture.  There is a dog ear on the medial aspect that can be better contoured.  The following  portions of the patient's history were reviewed and updated as appropriate: allergies, current medications, past family history, past medical history, past social history, past surgical history and problem list.  Review of Systems  All other systems reviewed and are negative.     Objective:    Physical Exam  Constitutional: She appears well-developed and well-nourished.  HENT:   Head: Normocephalic and atraumatic.  Eyes: Conjunctivae are normal. Pupils are equal, round, and reactive to light.  Cardiovascular: Normal rate.   Pulmonary/Chest: Effort normal.  Skin: Skin is warm.  Psychiatric: She has a normal mood and affect. Her behavior is normal.     Assessment:   1.  Capsular contracture of breast implant, initial encounter        Plan:     Recommend release of the capsular contracture with a capsulectomy and possible repositioning of the implant more medially with excision of excess tissue on the medial aspect.    

## 2013-03-02 NOTE — Anesthesia Postprocedure Evaluation (Signed)
Anesthesia Post Note  Patient: [name]  Procedure(s) Performed: Procedure(s) (LRB): BREAST CAPSULECTOMY WITH REPOSITIONING THE IMPLANT (Right) REMOVAL PORT-A-CATH (Left) LIPOSUCTION (Bilateral)  Anesthesia type: General  Patient location: PACU  Post pain: Pain level controlled and Adequate analgesia  Post assessment: Post-op Vital signs reviewed, Patient's Cardiovascular Status Stable, Respiratory Function Stable, Patent Airway and Pain level controlled  Last Vitals:  Filed Vitals:   03/02/13 1315  BP: 133/64  Pulse: 86  Temp:   Resp: 13    Post vital signs: Reviewed and stable  Level of consciousness: awake, alert  and oriented  Complications: No apparent anesthesia complications

## 2013-03-02 NOTE — Op Note (Signed)
Operative report   SURGICAL DIVISION: Plastic Surgery  PREOPERATIVE DIAGNOSES:  1. History of breast cancer.  2. 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   POSTOPERATIVE DIAGNOSES:  1. History of breast cancer. Asymmetry due to mastectomy. 2. 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   PROCEDURE:  1. Right breast capsulectomy lateral with implant respositioning. 2. Fat removal lateral right and left breast.  SURGEON: Wayland Denis, DO  ASSISTANT: Shawn Rayburn, PA.  ANESTHESIA:  General.   COMPLICATIONS: None.   INDICATIONS FOR PROCEDURE:  The patient has a history of right mastectomy and had tissue expanders placed at the time of mastectomies. She now presents for release of contracture and repositioning of the implant.  She requires capsulotomies to better position the implant.   CONSENT:  Informed consent was obtained directly from the patient. Risks, benefits and alternatives were fully discussed. Specific risks including but not limited to bleeding, infection, hematoma, seroma, scarring, pain, implant infection, implant extrusion, capsular contracture, asymmetry, wound healing problems, and need for further surgery were all discussed. The patient did have an ample opportunity to have her questions answered to her satisfaction.   DESCRIPTION OF PROCEDURE:  The patient was taken to the operating room. SCDs were placed and IV antibiotics were given. The patient's chest was prepped and draped in a sterile fashion. A time out was performed with all information correct.  Tummescent was injected in the lateral right and left breast area.     The old mastectomy scar was opened and superior mastectomy and inferior mastectomy flaps were re-raised 1-2 cm over the pectoralis major muscle. The capsule was entered and the implant removed and placed into a basin with antibiotic solution. The lateral capsule was excised and the edges sutured together to  be more medially placed.    Gloves were changed. The implant was replaced and the muscle reapproximated with 3-0 Vicryl followed by 4-0 Vicryl and finally by 5-0 Monocryl for the skin. Liposuction was performed on the lateral aspect of both sides with a total of 400 cc removed. The skin puncture on the left was closed with 5-0 Monocryl.  Sterile dressings were applied.  A breast binder and ABDs were placed.  The patient was allowed to wake up from anesthesia and taken to the recovery room in satisfactory condition.

## 2013-03-02 NOTE — Anesthesia Procedure Notes (Signed)
Procedure Name: LMA Insertion Date/Time: 03/02/2013 10:28 AM Performed by: Zenia Resides D Pre-anesthesia Checklist: Patient identified, Emergency Drugs available, Suction available and Patient being monitored Patient Re-evaluated:Patient Re-evaluated prior to inductionOxygen Delivery Method: Circle System Utilized Preoxygenation: Pre-oxygenation with 100% oxygen Intubation Type: IV induction Ventilation: Mask ventilation without difficulty LMA: LMA inserted LMA Size: 4.0 Number of attempts: 1 Airway Equipment and Method: bite block Placement Confirmation: positive ETCO2 and breath sounds checked- equal and bilateral Tube secured with: Tape Dental Injury: Teeth and Oropharynx as per pre-operative assessment

## 2013-03-02 NOTE — Transfer of Care (Signed)
Immediate Anesthesia Transfer of Care Note  Patient: [name]  Procedure(s) Performed: Procedure(s): BREAST CAPSULECTOMY WITH REPOSITIONING THE IMPLANT (Right) REMOVAL PORT-A-CATH (Left) LIPOSUCTION (Bilateral)  Patient Location: PACU  Anesthesia Type:General  Level of Consciousness: awake, alert  and oriented  Airway & Oxygen Therapy: Patient Spontanous Breathing and Patient connected to face mask oxygen  Post-op Assessment: Report given to PACU RN and Post -op Vital signs reviewed and stable  Post vital signs: Reviewed and stable  Complications: No apparent anesthesia complications

## 2013-03-02 NOTE — Brief Op Note (Signed)
03/02/2013  12:09 PM  PATIENT:  [name]  66 y.o. female  PRE-OPERATIVE DIAGNOSIS:  HISTORY OF Right  BREAST CANCER  POST-OPERATIVE DIAGNOSIS:  HISTORY OF Right  BREAST CANCER  PROCEDURE:  Procedure(s): BREAST CAPSULECTOMY WITH REPOSITIONING THE IMPLANT (Right) REMOVAL PORT-A-CATH (Left) LIPOSUCTION (Bilateral)  SURGEON:  Surgeon(s) and Role: Panel 1:    * Claire Sanger, DO - Primary  Panel 2:    * [name], MD - Primary  PHYSICIAN ASSISTANT: Shawn Rayburn, PA  ASSISTANTS: none   ANESTHESIA:   general  EBL:  Total I/O In: 1000 [I.V.:1000] Out: -   BLOOD ADMINISTERED:none  DRAINS: none   LOCAL MEDICATIONS USED:  MARCAINE     SPECIMEN:  Source of Specimen:  right mastectomy scar  DISPOSITION OF SPECIMEN:  PATHOLOGY  COUNTS:  YES  TOURNIQUET:  * No tourniquets in log *  DICTATION: .Dragon Dictation  PLAN OF CARE: Discharge to home after PACU  PATIENT DISPOSITION:  PACU - hemodynamically stable.   Delay start of Pharmacological VTE agent (>24hrs) due to surgical blood loss or risk of bleeding: no

## 2013-03-02 NOTE — Interval H&P Note (Signed)
History and Physical Interval Note:  03/02/2013 9:22 AM  [name]  has presented today for surgery, with the diagnosis of HISTORY OF BREAST CANCER  The various methods of treatment have been discussed with the patient and family. After consideration of risks, benefits and other options for treatment, the patient has consented to  Procedure(s): BREAST CAPSULECTOMY WITH REPOSITIONING THE IMPLANT (Right) REMOVAL PORT-A-CATH (N/A) REMOVAL PORT-A-CATH (N/A) as a surgical intervention .  The patient's history has been reviewed, patient examined, no change in status, stable for surgery.  I have reviewed the patient's chart and labs.  Questions were answered to the patient's satisfaction.     SANGER,CLAIRE

## 2013-03-06 ENCOUNTER — Encounter (HOSPITAL_BASED_OUTPATIENT_CLINIC_OR_DEPARTMENT_OTHER): Payer: Self-pay | Admitting: Plastic Surgery

## 2013-04-10 ENCOUNTER — Encounter (INDEPENDENT_AMBULATORY_CARE_PROVIDER_SITE_OTHER): Payer: BC Managed Care – PPO | Admitting: General Surgery

## 2013-04-17 ENCOUNTER — Telehealth (INDEPENDENT_AMBULATORY_CARE_PROVIDER_SITE_OTHER): Payer: Self-pay | Admitting: General Surgery

## 2013-04-17 NOTE — Telephone Encounter (Signed)
LMOM letting pt know that she does not need to come in for a PO appt with [place] as long as Dr. Kelly Splinter is watching her.

## 2013-05-31 ENCOUNTER — Other Ambulatory Visit: Payer: Self-pay | Admitting: *Deleted

## 2013-05-31 DIAGNOSIS — C50919 Malignant neoplasm of unspecified site of unspecified female breast: Secondary | ICD-10-CM

## 2013-05-31 MED ORDER — TAMOXIFEN CITRATE 20 MG PO TABS: 20.0000 mg | ORAL_TABLET | Freq: Every day | ORAL | Status: DC

## 2013-05-31 NOTE — Telephone Encounter (Signed)
Left VM for patient to call office to make appointment for her missed 6 month follow up appointment. Approved refill X 1 of Tamoxifen.

## 2013-06-02 ENCOUNTER — Telehealth: Payer: Self-pay | Admitting: Oncology

## 2013-06-02 NOTE — Telephone Encounter (Signed)
, °

## 2013-06-05 ENCOUNTER — Telehealth: Payer: Self-pay | Admitting: Oncology

## 2013-06-05 NOTE — Telephone Encounter (Signed)
, °

## 2013-06-07 ENCOUNTER — Other Ambulatory Visit: Payer: BC Managed Care – PPO | Admitting: Lab

## 2013-06-07 ENCOUNTER — Ambulatory Visit: Payer: BC Managed Care – PPO | Admitting: Adult Health

## 2013-06-08 ENCOUNTER — Other Ambulatory Visit: Payer: Self-pay | Admitting: Medical Oncology

## 2013-06-08 DIAGNOSIS — C50911 Malignant neoplasm of unspecified site of right female breast: Secondary | ICD-10-CM

## 2013-06-09 ENCOUNTER — Ambulatory Visit: Payer: BC Managed Care – PPO | Admitting: Lab

## 2013-06-09 ENCOUNTER — Telehealth: Payer: Self-pay | Admitting: Oncology

## 2013-06-09 ENCOUNTER — Ambulatory Visit (HOSPITAL_BASED_OUTPATIENT_CLINIC_OR_DEPARTMENT_OTHER): Payer: BC Managed Care – PPO | Admitting: Adult Health

## 2013-06-09 ENCOUNTER — Encounter: Payer: Self-pay | Admitting: Adult Health

## 2013-06-09 ENCOUNTER — Other Ambulatory Visit (HOSPITAL_BASED_OUTPATIENT_CLINIC_OR_DEPARTMENT_OTHER): Payer: BC Managed Care – PPO | Admitting: Lab

## 2013-06-09 VITALS — BP 118/71 | HR 85 | Temp 97.9°F | Resp 20 | Ht 63.5 in | Wt 238.8 lb

## 2013-06-09 DIAGNOSIS — C50919 Malignant neoplasm of unspecified site of unspecified female breast: Secondary | ICD-10-CM

## 2013-06-09 DIAGNOSIS — Z170 Estrogen receptor positive status [ER+]: Secondary | ICD-10-CM

## 2013-06-09 DIAGNOSIS — M899 Disorder of bone, unspecified: Secondary | ICD-10-CM | POA: Diagnosis not present

## 2013-06-09 DIAGNOSIS — C50911 Malignant neoplasm of unspecified site of right female breast: Secondary | ICD-10-CM

## 2013-06-09 DIAGNOSIS — M8580 Other specified disorders of bone density and structure, unspecified site: Secondary | ICD-10-CM

## 2013-06-09 DIAGNOSIS — R32 Unspecified urinary incontinence: Secondary | ICD-10-CM

## 2013-06-09 DIAGNOSIS — E559 Vitamin D deficiency, unspecified: Secondary | ICD-10-CM

## 2013-06-09 LAB — COMPREHENSIVE METABOLIC PANEL (CC13)
ALT: 13 U/L (ref 0–55)
AST: 16 U/L (ref 5–34)
Albumin: 3.2 g/dL — ABNORMAL LOW (ref 3.5–5.0)
Alkaline Phosphatase: 47 U/L (ref 40–150)
BUN: 17.4 mg/dL (ref 7.0–26.0)
CO2: 27 mEq/L (ref 22–29)
Calcium: 9.8 mg/dL (ref 8.4–10.4)
Chloride: 102 mEq/L (ref 98–109)
Creatinine: 0.8 mg/dL (ref 0.6–1.1)
Glucose: 98 mg/dl (ref 70–140)
Potassium: 3.7 mEq/L (ref 3.5–5.1)
Sodium: 139 mEq/L (ref 136–145)
Total Bilirubin: 0.39 mg/dL (ref 0.20–1.20)
Total Protein: 6.2 g/dL — ABNORMAL LOW (ref 6.4–8.3)

## 2013-06-09 LAB — CBC WITH DIFFERENTIAL/PLATELET
BASO%: 0.9 % (ref 0.0–2.0)
Basophils Absolute: 0.1 10*3/uL (ref 0.0–0.1)
EOS%: 1.5 % (ref 0.0–7.0)
Eosinophils Absolute: 0.1 10*3/uL (ref 0.0–0.5)
HCT: 39.2 % (ref 34.8–46.6)
HGB: 13 g/dL (ref 11.6–15.9)
LYMPH%: 37.3 % (ref 14.0–49.7)
MCH: 29.4 pg (ref 25.1–34.0)
MCHC: 33.1 g/dL (ref 31.5–36.0)
MCV: 88.7 fL (ref 79.5–101.0)
MONO#: 0.6 10*3/uL (ref 0.1–0.9)
MONO%: 8.3 % (ref 0.0–14.0)
NEUT#: 3.9 10*3/uL (ref 1.5–6.5)
NEUT%: 52 % (ref 38.4–76.8)
Platelets: 222 10*3/uL (ref 145–400)
RBC: 4.42 10*6/uL (ref 3.70–5.45)
RDW: 13.1 % (ref 11.2–14.5)
WBC: 7.5 10*3/uL (ref 3.9–10.3)
lymph#: 2.8 10*3/uL (ref 0.9–3.3)

## 2013-06-09 NOTE — Progress Notes (Addendum)
OFFICE PROGRESS NOTE  CC [name] M.D. [name] M.D.  Ihor Gully [address] [place] [address]  DIAGNOSIS: 67 year old female with multicentric right breast cancer that was ER positive PR positive HER-2/neu positive clinical stage I.  PRIOR THERAPY:  #1 status post right simple mastectomy with right axillary sentinel node biopsy and left subclavian power port insertion on 06/26/2011.  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 Sentinel node biopsy done at the time of her mastectomy was negative for metastatic disease. Pathologic stage TI C. N0 M0, stage I breast cancer.  #3 patient has been receiving weekly Taxotere carboplatinum and Herceptin beginning November 2012. However she has had considerable side effects requiring discontinuation/holding of the chemotherapy.  #4 S/P Taxotere/Crboplatin q 3 weeks with weekly Herceptin beginning 09/30/11 x 3 cycles completed on 11/11/11  #5. Herceptin q 21 days begin 12/02/11  #6 adjuvant Aromasin 25 mg daily starting 02/05/2012.this was discontinued September 2013 do to incapacitating myalgias and arthralgias.  #7 tamoxifen 20 mg daily begun 05/27/2012.  CURRENT THERAPY: Tamoxifen 20 mg daily starting 05/27/2012  INTERVAL HISTORY: [name] 67 y.o. female is here for follow up of her breast cancer.  She is taking Tamoxifen daily and doing moderately well with this.  She continues to have joint pains which are worsening, her hair is thinning, she is having hot flashes, her mobility is decreased, she is also suffering from vaginal dryness, incontinence.  She is having intermittent dysuria, and incontinence that's been going on for one year. Otherwise, she is well and w/o questions/concerns.  MEDICAL HISTORY: Past Medical History  Diagnosis Date  . Allergy   . Hypertension   . Hyperlipidemia   .  Depression   . Fibromyalgia   . Arthritis     feet, knee and base of thumbs  . Osteopenia   . Carpal tunnel syndrome     right  . Breast cancer, stage 1 07/18/2011  . Dehydration 10/21/2011  . Acne   . Asthma     controlled  . Multiple allergies   . PONV (postoperative nausea and vomiting)     has had to use scop patch    ALLERGIES:  has No Known Allergies.  MEDICATIONS:  Current Outpatient Prescriptions  Medication Sig Dispense Refill  . alendronate (FOSAMAX) 70 MG tablet Take 70 mg by mouth every 7 (seven) days. Take with a full glass of water on an empty stomach.      Marland Kitchen atorvastatin (LIPITOR) 20 MG tablet       . calcium gluconate 500 MG tablet Take 500 mg by mouth daily.        . cetirizine (ZYRTEC) 10 MG tablet Take 10 mg by mouth daily.      . cholecalciferol (VITAMIN D) 1000 UNITS tablet Take 2,000 Units by mouth daily.        . DULoxetine (CYMBALTA) 60 MG capsule Take 120 mg by mouth daily.       . fluticasone (FLONASE) 50 MCG/ACT nasal spray Place 2 sprays into the nose daily.      Marland Kitchen gabapentin (NEURONTIN) 600 MG tablet Take 600 mg by mouth daily.       . hydrochlorothiazide (HYDRODIURIL) 25 MG tablet Take 25 mg by mouth daily.      Boris Lown Oil 300 MG CAPS Take 1 capsule by mouth daily.        Marland Kitchen  lisinopril-hydrochlorothiazide (PRINZIDE,ZESTORETIC) 10-12.5 MG per tablet Take 1 tablet by mouth daily.      . magnesium 30 MG tablet Take 500 mg by mouth daily.       . Multiple Vitamin (MULTIVITAMIN) capsule Take 1 capsule by mouth daily.        Marland Kitchen PATANASE 0.6 % SOLN       . tamoxifen (NOLVADEX) 20 MG tablet Take 1 tablet (20 mg total) by mouth daily.  30 tablet  0  . TAZORAC 0.1 % cream       . traMADol (ULTRAM) 50 MG tablet Take 1 tablet (50 mg total) by mouth as needed for pain (PRN for osteoarthritis and fibromyalgia pain. ).  90 tablet  0  . traZODone (DESYREL) 50 MG tablet Take 50 mg by mouth at bedtime.      . VOLTAREN 1 % GEL       . [DISCONTINUED] diphenhydrAMINE  (SOMINEX) 25 MG tablet Take 25 mg by mouth at bedtime as needed.         No current facility-administered medications for this visit.    SURGICAL HISTORY:  Past Surgical History  Procedure Laterality Date  . Tonsillectomy  1950  . Tubal ligation  1978  . Abdominal hysterectomy  1981  . Right ear surgery  1983    tumor removed (?)  . Right breast lumpectomy  1986  . Ethmoidectomy  1991  . Knee arthroscopy  1996, 2001    left  . Facial plastic surgery  1996  . Bunionectomy  1998    left foot  . Total knee arthroplasty  2006    left  . Nasal sinus surgery  2001  . Lap band surgery  2010  . Right mastectomy  06-26-11  . Port-a-cath placement  06/26/11    tip in lower SVC per chest x-ray read by Dr. Dwyane Dee  . Mastopexy  04/13/2012    Procedure: MASTOPEXY;  Surgeon: Wayland Denis, DO;  Location: [facility];  Service: Plastics;  Laterality: Left;   left breast  mastopexy reduction for symmetry  . Breast capsulectomy with implant exchange Right 03/02/2013    Procedure: BREAST CAPSULECTOMY WITH REPOSITIONING THE IMPLANT;  Surgeon: Wayland Denis, DO;  Location: [facility];  Service: Plastics;  Laterality: Right;  . Liposuction Bilateral 03/02/2013    Procedure: LIPOSUCTION;  Surgeon: Wayland Denis, DO;  Location: [facility];  Service: Plastics;  Laterality: Bilateral;  . Port-a-cath removal Left 03/02/2013    Procedure: REMOVAL PORT-A-CATH;  Surgeon: [name], MD;  Location:  SURGERY CENTER;  Service: General;  Laterality: Left;    Health Maintenance  Mammogram: 02/2013 Colonoscopy: 10 years ago due Bone Density Scan: 04/2011 (osteopenia) due Pap Smear: scheduled 09/2013 Eye Exam: 03/2013 Vitamin D Level: 06/2012-29 Lipid Panel:  09/2013  REVIEW OF SYSTEMS:   A 10 point review of systems was conducted and is otherwise negative except for what is noted above.    PHYSICAL EXAMINATION:  BP 118/71  Pulse 85   Temp(Src) 97.9 F (36.6 C) (Oral)  Resp 20  Ht 5' 3.5" (1.613 m)  Wt 238 lb 12.8 oz (108.319 kg)  BMI 41.63 kg/m2 General: Patient is a well appearing female in no acute distress HEENT: PERRLA, sclerae anicteric no conjunctival pallor, MMM Neck: supple, no palpable adenopathy Lungs: clear to auscultation bilaterally, no wheezes, rhonchi, or rales Cardiovascular: regular rate rhythm, S1, S2, no murmurs, rubs or gallops Abdomen: Soft, non-tender, non-distended, normoactive bowel sounds,  no HSM Extremities: warm and well perfused, no clubbing, cyanosis, or edema Skin: No rashes or lesions Neuro: Non-focal Left breast no masses nipple discharge, scar from reduction healing well. Right mastectomy scar with implant is healing well. There still is some scar tissue formation and some scabbing. But there is no signs of infection ECOG PERFORMANCE STATUS: 0 - Asymptomatic  LABORATORY DATA: Lab Results  Component Value Date   WBC 7.5 06/09/2013   HGB 13.0 06/09/2013   HCT 39.2 06/09/2013   MCV 88.7 06/09/2013   PLT 222 06/09/2013      Chemistry      Component Value Date/Time   NA 139 06/09/2013 1227   NA 135 03/01/2013 1500   K 3.7 06/09/2013 1227   K 4.0 03/01/2013 1500   CL 101 03/01/2013 1500   CL 99 09/09/2012 1245   CO2 27 06/09/2013 1227   CO2 24 03/01/2013 1500   BUN 17.4 06/09/2013 1227   BUN 20 03/01/2013 1500   CREATININE 0.8 06/09/2013 1227   CREATININE 0.76 03/01/2013 1500      Component Value Date/Time   CALCIUM 9.8 06/09/2013 1227   CALCIUM 8.9 03/01/2013 1500   ALKPHOS 47 06/09/2013 1227   ALKPHOS 79 05/06/2012 1325   AST 16 06/09/2013 1227   AST 17 05/06/2012 1325   ALT 13 06/09/2013 1227   ALT 16 05/06/2012 1325   BILITOT 0.39 06/09/2013 1227   BILITOT 0.4 05/06/2012 1325       RADIOGRAPHIC STUDIES:  No results found.  ASSESSMENT: 67 year old female with   #1 stage I HER-2/neu positive breast cancer. Her final stage was 1.9 cm high-grade poorly differentiated invasive  ductal carcinoma no LVI ER positive PR positive HER-2/neu positive. She underwent a mastectomy. No sentinel nodes were involved with disease.   #2. Patient completed one year of herceptin therapy in December 2013.  .  #3 having significant problems with aches and pains most likely due to the Aromasin. This was discontinued.  #4 patient will remain off of Aromasin. I do not think that aromatase inhibitors are drugs of choice for her. She started tamoxifen 20 mg daily.   PLAN:   #1 Doing well.  No sign of recurrence. We discussed survivorship.  I added on a urinalysis and urine culture for the urge incontinence.  I also referred her to Eulis Foster, PT for pelvic rehab due to the urge incontinence.    #2 She will continue tamoxifen 20 mg daily.  She is tolerating this well.    #3 I ordered a bone density evaluation.  It has been 2 years since her last one and she is on Fosamax therapy.    #4 She will return in 6 months for f/u.    I spent 25 minutes counseling the patient face to face. The total time spent in the appointment was 30 minutes.   Cherie Ouch Lyn Hollingshead, NP Medical Oncology [facility] Phone: [phone]  06/10/2013, 8:54 AM  ATTENDING'S ATTESTATION:  I personally reviewed patient's chart, examined patient myself, formulated the treatment plan as followed.    Overall patient is doing well she continues on tamoxifen she is tolerating this relatively well. She does need bone density scans. She is on Fosamax tolerating it well. We will plan on seeing her back in 6 months time.  Drue Second, MD Medical/Oncology [facility] [phone] (beeper) [phone] (Office)  07/03/2013, 9:59 AM

## 2013-06-09 NOTE — Patient Instructions (Signed)
Doing well.  No sign of recurrence.  Continue Tamoxifen daily.  I have ordered a bone density, referred you for physical therapy, and will see you back in 6 months. Please call us if you have any questions or concerns.

## 2013-06-09 NOTE — Telephone Encounter (Signed)
Pt sent back to lab and given appt schedule for March 2015 and appt for bone density 11/6 @ [facility]. Per Jearld Pies [facility] is currently booked out in September and not doing any bone density test the month of October due to Breast Cancer awareness month and the appt of mammo appts they will be dealing with - pt aware. Also lmonvm @ [facility] Eulis Foster - [phone]) requesting appt for pt. Pt aware she will be contacted w/rehab appt.

## 2013-07-13 ENCOUNTER — Other Ambulatory Visit: Payer: Self-pay | Admitting: *Deleted

## 2013-07-13 DIAGNOSIS — C50919 Malignant neoplasm of unspecified site of unspecified female breast: Secondary | ICD-10-CM

## 2013-07-13 MED ORDER — TAMOXIFEN CITRATE 20 MG PO TABS: 20.0000 mg | ORAL_TABLET | Freq: Every day | ORAL | Status: DC

## 2013-07-20 ENCOUNTER — Other Ambulatory Visit: Payer: BC Managed Care – PPO

## 2013-11-28 DIAGNOSIS — E538 Deficiency of other specified B group vitamins: Secondary | ICD-10-CM | POA: Diagnosis not present

## 2013-11-28 DIAGNOSIS — Z0000 Encounter for general adult medical examination without abnormal findings: Secondary | ICD-10-CM | POA: Diagnosis not present

## 2013-11-28 DIAGNOSIS — E559 Vitamin D deficiency, unspecified: Secondary | ICD-10-CM | POA: Diagnosis not present

## 2013-11-28 DIAGNOSIS — E785 Hyperlipidemia, unspecified: Secondary | ICD-10-CM | POA: Diagnosis not present

## 2013-11-28 DIAGNOSIS — M949 Disorder of cartilage, unspecified: Secondary | ICD-10-CM | POA: Diagnosis not present

## 2013-11-28 DIAGNOSIS — F3289 Other specified depressive episodes: Secondary | ICD-10-CM | POA: Diagnosis not present

## 2013-11-28 DIAGNOSIS — M1990 Unspecified osteoarthritis, unspecified site: Secondary | ICD-10-CM | POA: Diagnosis not present

## 2013-11-28 DIAGNOSIS — Z23 Encounter for immunization: Secondary | ICD-10-CM | POA: Diagnosis not present

## 2013-11-28 DIAGNOSIS — F329 Major depressive disorder, single episode, unspecified: Secondary | ICD-10-CM | POA: Diagnosis not present

## 2013-11-28 DIAGNOSIS — M899 Disorder of bone, unspecified: Secondary | ICD-10-CM | POA: Diagnosis not present

## 2013-11-28 DIAGNOSIS — I10 Essential (primary) hypertension: Secondary | ICD-10-CM | POA: Diagnosis not present

## 2013-12-05 ENCOUNTER — Telehealth: Payer: Self-pay | Admitting: Oncology

## 2013-12-05 NOTE — Telephone Encounter (Signed)
, °

## 2013-12-07 ENCOUNTER — Ambulatory Visit: Payer: BC Managed Care – PPO | Admitting: Oncology

## 2013-12-07 ENCOUNTER — Other Ambulatory Visit: Payer: BC Managed Care – PPO

## 2013-12-07 DIAGNOSIS — R7309 Other abnormal glucose: Secondary | ICD-10-CM | POA: Diagnosis not present

## 2013-12-07 DIAGNOSIS — D485 Neoplasm of uncertain behavior of skin: Secondary | ICD-10-CM | POA: Diagnosis not present

## 2013-12-07 DIAGNOSIS — L708 Other acne: Secondary | ICD-10-CM | POA: Diagnosis not present

## 2013-12-07 DIAGNOSIS — N3946 Mixed incontinence: Secondary | ICD-10-CM | POA: Diagnosis not present

## 2014-01-09 DIAGNOSIS — M1710 Unilateral primary osteoarthritis, unspecified knee: Secondary | ICD-10-CM | POA: Diagnosis not present

## 2014-01-09 DIAGNOSIS — M25559 Pain in unspecified hip: Secondary | ICD-10-CM | POA: Diagnosis not present

## 2014-01-11 ENCOUNTER — Other Ambulatory Visit (HOSPITAL_COMMUNITY): Payer: Self-pay | Admitting: Orthopaedic Surgery

## 2014-01-16 DIAGNOSIS — N318 Other neuromuscular dysfunction of bladder: Secondary | ICD-10-CM | POA: Diagnosis not present

## 2014-01-16 DIAGNOSIS — N390 Urinary tract infection, site not specified: Secondary | ICD-10-CM | POA: Diagnosis not present

## 2014-01-23 ENCOUNTER — Encounter (HOSPITAL_COMMUNITY): Payer: Self-pay | Admitting: Pharmacy Technician

## 2014-01-23 DIAGNOSIS — N390 Urinary tract infection, site not specified: Secondary | ICD-10-CM

## 2014-01-23 HISTORY — DX: Urinary tract infection, site not specified: N39.0

## 2014-01-24 ENCOUNTER — Encounter (HOSPITAL_COMMUNITY)
Admission: RE | Admit: 2014-01-24 | Discharge: 2014-01-24 | Disposition: A | Discharge status: Home / Self Care | Payer: Medicare Other | Source: Ambulatory Visit | Attending: Orthopaedic Surgery | Admitting: Orthopaedic Surgery

## 2014-01-24 ENCOUNTER — Encounter (HOSPITAL_COMMUNITY): Payer: Self-pay

## 2014-01-24 ENCOUNTER — Ambulatory Visit (HOSPITAL_COMMUNITY)
Admission: RE | Admit: 2014-01-24 | Discharge: 2014-01-24 | Disposition: A | Discharge status: Home / Self Care | Payer: Medicare Other | Source: Ambulatory Visit | Attending: Orthopaedic Surgery | Admitting: Orthopaedic Surgery

## 2014-01-24 DIAGNOSIS — Z01818 Encounter for other preprocedural examination: Secondary | ICD-10-CM | POA: Diagnosis not present

## 2014-01-24 HISTORY — DX: Urinary tract infection, site not specified: N39.0

## 2014-01-24 HISTORY — DX: Inflammatory liver disease, unspecified: K75.9

## 2014-01-24 LAB — URINALYSIS, ROUTINE W REFLEX MICROSCOPIC
BILIRUBIN URINE: NEGATIVE
Glucose, UA: NEGATIVE mg/dL
Hgb urine dipstick: NEGATIVE
Ketones, ur: NEGATIVE mg/dL
NITRITE: NEGATIVE
PROTEIN: NEGATIVE mg/dL
Specific Gravity, Urine: 1.018 (ref 1.005–1.030)
UROBILINOGEN UA: 0.2 mg/dL (ref 0.0–1.0)
pH: 5.5 (ref 5.0–8.0)

## 2014-01-24 LAB — TYPE AND SCREEN
ABO/RH(D): O POS
Antibody Screen: NEGATIVE

## 2014-01-24 LAB — ABO/RH: ABO/RH(D): O POS

## 2014-01-24 LAB — CBC
HEMATOCRIT: 42.3 % (ref 36.0–46.0)
Hemoglobin: 14 g/dL (ref 12.0–15.0)
MCH: 30 pg (ref 26.0–34.0)
MCHC: 33.1 g/dL (ref 30.0–36.0)
MCV: 90.6 fL (ref 78.0–100.0)
PLATELETS: 275 10*3/uL (ref 150–400)
RBC: 4.67 MIL/uL (ref 3.87–5.11)
RDW: 13.6 % (ref 11.5–15.5)
WBC: 8.2 10*3/uL (ref 4.0–10.5)

## 2014-01-24 LAB — COMPREHENSIVE METABOLIC PANEL
ALT: 18 U/L (ref 0–35)
AST: 21 U/L (ref 0–37)
Albumin: 3.7 g/dL (ref 3.5–5.2)
Alkaline Phosphatase: 57 U/L (ref 39–117)
BILIRUBIN TOTAL: 0.2 mg/dL — AB (ref 0.3–1.2)
BUN: 18 mg/dL (ref 6–23)
CALCIUM: 10.6 mg/dL — AB (ref 8.4–10.5)
CO2: 28 mEq/L (ref 19–32)
CREATININE: 0.66 mg/dL (ref 0.50–1.10)
Chloride: 96 mEq/L (ref 96–112)
GFR, EST NON AFRICAN AMERICAN: 89 mL/min — AB (ref >90)
Glucose, Bld: 96 mg/dL (ref 70–99)
Potassium: 3.6 mEq/L — ABNORMAL LOW (ref 3.7–5.3)
Sodium: 138 mEq/L (ref 137–147)
Total Protein: 7 g/dL (ref 6.0–8.3)

## 2014-01-24 LAB — URINE MICROSCOPIC-ADD ON

## 2014-01-24 LAB — SURGICAL PCR SCREEN
MRSA, PCR: NEGATIVE
Staphylococcus aureus: NEGATIVE

## 2014-01-24 LAB — PROTIME-INR
INR: 0.87 (ref 0.00–1.49)
PROTHROMBIN TIME: 11.7 s (ref 11.6–15.2)

## 2014-01-24 NOTE — Pre-Procedure Instructions (Signed)
[name]  01/24/2014   Your procedure is scheduled on:  Monday, May 18 at 1100 AM  Report to [facility] Admitting at 0900 AM.  Call this number if you have problems the morning of surgery: [phone]   Remember:   Do not eat food or drink liquids after midnight.Sunday night   Take these medicines the morning of surgery with A SIP OF WATER: Zyrtec, Duloxetine(cymbalta), Flonase spray , Pain medication as needed   Do not wear jewelry, make-up or nail polish.  Do not wear lotions, powders, or perfumes. Do not wear deodorant.  Do not shave 48 hours prior to surgery.    Do not bring valuables to the hospital.  [facility] is not responsible   for any belongings or valuables.               Contacts, dentures or bridgework may not be worn into surgery.  Leave suitcase in the car. After surgery it may be brought to your room.  For patients admitted to the hospital, discharge time is determined by your    treatment team.                  Special Instructions: [name] - Preparing for Surgery  Before surgery, you can play an important role.  Because skin is not sterile, your skin needs to be as free of germs as possible.  You can reduce the number of germs on you skin by washing with CHG (chlorahexidine gluconate) soap before surgery.  CHG is an antiseptic cleaner which kills germs and bonds with the skin to continue killing germs even after washing.  Please DO NOT use if you have an allergy to CHG or antibacterial soaps.  If your skin becomes reddened/irritated stop using the CHG and inform your nurse when you arrive at Short Stay.  Do not shave (including legs and underarms) for at least 48 hours prior to the first CHG shower.  You may shave your face.  Please follow these instructions carefully:   1.  Shower with CHG Soap the night before surgery and the    morning of Surgery.  2.  If you choose to wash your hair, wash your hair first as usual with your   normal  shampoo.  3.  After you shampoo, rinse your hair and body thoroughly to remove the   Shampoo.  4.  Use CHG as you would any other liquid soap.  You can apply chg directly   to the skin and wash gently with scrungie or a clean washcloth.  5.  Apply the CHG Soap to your body ONLY FROM THE NECK DOWN.    Do not use on open wounds or open sores.  Avoid contact with your eyes,   ears, mouth and genitals (private parts).  Wash genitals (private parts)    with your normal soap.  6.  Wash thoroughly, paying special attention to the area where your surgery  will be performed.  7.  Thoroughly rinse your body with warm water from the neck down.  8.  DO NOT shower/wash with your normal soap after using and rinsing off  the CHG Soap.  9.  Pat yourself dry with a clean towel.            10.  Wear clean pajamas.            11 .  Place clean sheets on your bed the night of your first shower and do  not  sleep with pets.  Day of Surgery  Do not apply any lotions/deoderants the morning of surgery.  Please wear clean clothes to the hospital/surgery center.     Please read over the following fact sheets that you were given: Pain Booklet, Coughing and Deep Breathing, Blood Transfusion Information, Total Joint Packet and Surgical Site Infection Prevention

## 2014-01-24 NOTE — Progress Notes (Signed)
Pt states she currently has UTI and is to pick up antibiotic from pharmacy today. Mickel Baas @ Dr Lorin Mercy office in formed. Order for Urine culture taken verbally. States Dr Lorin Mercy will be informed.

## 2014-01-25 ENCOUNTER — Other Ambulatory Visit (HOSPITAL_COMMUNITY): Payer: Self-pay | Admitting: Orthopaedic Surgery
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
## 2014-01-26 LAB — URINE CULTURE: Colony Count: >=100000

## 2014-01-26 NOTE — H&P (Signed)
TOTAL KNEE ADMISSION H&P  Patient is being admitted for right total knee arthroplasty.  Subjective:  Chief Complaint:right knee pain.  HPI: [name], 68 y.o. female, has a history of pain and functional disability in the right knee due to arthritis and has failed non-surgical conservative treatments for greater than 12 weeks to includeNSAID's and/or analgesics and corticosteriod injections.  Onset of symptoms was gradual, starting 3 years ago with gradually worsening course since that time. The patient noted no past surgery on the right knee(s).  Patient currently rates pain in the right knee(s) at 6 out of 10 with activity. Patient has night pain, worsening of pain with activity and weight bearing, pain that interferes with activities of daily living and joint swelling.  Patient has evidence of subchondral sclerosis, periarticular osteophytes and joint space narrowing by imaging studies. This patient has had distal femur fracture. There is no active infection.  Patient Active Problem List   Diagnosis Date Noted  . 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 Acquired absence of breast and nipple 04/13/2012  . Symptomatic mammary hypertrophy 04/13/2012  . Breast cancer, stage 1 07/18/2011   Past Medical History  Diagnosis Date  . Allergy   . Hypertension   . Hyperlipidemia   . Depression   . Fibromyalgia   . Arthritis     feet, knee and base of thumbs  . Osteopenia   . Carpal tunnel syndrome     right  . Breast cancer, stage 1 07/18/2011  . Dehydration 10/21/2011  . Acne   . Asthma     controlled  . Multiple allergies   . PONV (postoperative nausea and vomiting)     has had to use scop patch  . Hepatitis 1976    hepatitis B  . Acute UTI 01/23/2014    to start ABX 01/24/2014    Past Surgical History  Procedure Laterality Date  . Tonsillectomy  1950  . Tubal ligation  1978  . Abdominal hysterectomy  1981  . Right ear surgery  1983    tumor removed (?)  . Right breast lumpectomy  1986   . Ethmoidectomy  1991  . Knee arthroscopy  1996, 2001    left  . Facial plastic surgery  1996  . Bunionectomy  1998    left foot  . Total knee arthroplasty  2006    left  . Nasal sinus surgery  2001  . Lap band surgery  2010  . Right mastectomy  06-26-11  . Port-a-cath placement  06/26/11    tip in lower SVC per chest x-ray read by Dr. Ivar Drape  . Mastopexy  04/13/2012    Procedure: MASTOPEXY;  Surgeon: Theodoro Kos, DO;  Location: [facility];  Service: Plastics;  Laterality: Left;   left breast  mastopexy reduction for symmetry  . Breast capsulectomy with implant exchange Right 03/02/2013    Procedure: BREAST CAPSULECTOMY WITH REPOSITIONING THE IMPLANT;  Surgeon: Theodoro Kos, DO;  Location: [facility];  Service: Plastics;  Laterality: Right;  . Liposuction Bilateral 03/02/2013    Procedure: LIPOSUCTION;  Surgeon: Theodoro Kos, DO;  Location: [facility];  Service: Plastics;  Laterality: Bilateral;  . Port-a-cath removal Left 03/02/2013    Procedure: REMOVAL PORT-A-CATH;  Surgeon: Rolm Bookbinder, MD;  Location: [facility];  Service: General;  Laterality: Left;  . Joint replacement Left 2005  . Mastectomy Right 2012    w/chemo  . Eye surgery    . Breast surgery  No prescriptions prior to admission   Allergies  Allergen Reactions  . Morphine And Related Nausea And Vomiting    PCA pump    History  Substance Use Topics  . Smoking status: Never Smoker   . Smokeless tobacco: Not on file  . Alcohol Use: No    Family History  Problem Relation Age of Onset  . Breast cancer Paternal Aunt   . Breast cancer Paternal Grandmother      Review of Systems  Musculoskeletal: Positive for joint pain.       Right knee  All other systems reviewed and are negative.   Objective:  Physical Exam  Constitutional: She is oriented to person, place, and time. She appears well-developed and well-nourished.  HENT:  Head:  Normocephalic and atraumatic.  Eyes: EOM are normal. Pupils are equal, round, and reactive to light.  Neck: Normal range of motion.  Cardiovascular: Normal rate and regular rhythm.   Respiratory: Effort normal.  GI: Soft.  Musculoskeletal:  .  No pain with hip range of motion on the right.  Well-healed proximal incision from antegrade nailing.  Twenty degrees varus.  Medial collateral laxity from erosive medial compartment wear.  Severe crepitus with range of motion, 2+ effusion.  Distal pulses are 2+.  Negative popliteal compression test.  Sensation in her foot is intact  Neurological: She is alert and oriented to person, place, and time.  Skin: Skin is warm and dry.  Psychiatric: She has a normal mood and affect.    Vital signs in last 24 hours:    Labs:   Estimated body mass index is 41.63 kg/(m^2) as calculated from the following:   Height as of 06/09/13: 5' 3.5" (1.613 m).   Weight as of 06/09/13: 108.319 kg (238 lb 12.8 oz).   Imaging Review Plain radiographs demonstrate severe degenerative joint disease of the right knee(s). The overall alignment issignificant varus. The bone quality appears to be adequate for age and reported activity level.  Assessment/Plan:  End stage arthritis, right knee   The patient history, physical examination, clinical judgment of the provider and imaging studies are consistent with end stage degenerative joint disease of the right knee(s) and total knee arthroplasty is deemed medically necessary. The treatment options including medical management, injection therapy arthroscopy and arthroplasty were discussed at length. The risks and benefits of total knee arthroplasty were presented and reviewed. The risks due to aseptic loosening, infection, stiffness, patella tracking problems, thromboembolic complications and other imponderables were discussed. The patient acknowledged the explanation, agreed to proceed with the plan and consent was signed. Patient  is being admitted for inpatient treatment for surgery, pain control, PT, OT, prophylactic antibiotics, VTE prophylaxis, progressive ambulation and ADL's and discharge planning. The patient is planning to be discharged home with home health services

## 2014-01-28 MED ORDER — CEFAZOLIN SODIUM-DEXTROSE 2-3 GM-% IV SOLR: 2.0000 g | INTRAVENOUS | Status: DC

## 2014-01-29 ENCOUNTER — Inpatient Hospital Stay (HOSPITAL_COMMUNITY)
Admission: RE | Admit: 2014-01-29 | Discharge: 2014-01-31 | DRG: 470 | Disposition: A | Discharge status: Home Health | Payer: Medicare Other | Source: Ambulatory Visit | Attending: Orthopaedic Surgery | Admitting: Orthopaedic Surgery

## 2014-01-29 ENCOUNTER — Encounter (HOSPITAL_COMMUNITY): Payer: Self-pay | Admitting: *Deleted

## 2014-01-29 ENCOUNTER — Encounter (HOSPITAL_COMMUNITY): Payer: Medicare Other | Admitting: Vascular Surgery

## 2014-01-29 ENCOUNTER — Inpatient Hospital Stay (HOSPITAL_COMMUNITY): Payer: Medicare Other | Admitting: Anesthesiology

## 2014-01-29 ENCOUNTER — Encounter (HOSPITAL_COMMUNITY)
Admission: RE | Disposition: A | Discharge status: Home Health | Payer: Self-pay | Source: Ambulatory Visit | Attending: Orthopaedic Surgery

## 2014-01-29 DIAGNOSIS — IMO0001 Reserved for inherently not codable concepts without codable children: Secondary | ICD-10-CM | POA: Diagnosis present

## 2014-01-29 DIAGNOSIS — A498 Other bacterial infections of unspecified site: Secondary | ICD-10-CM | POA: Diagnosis present

## 2014-01-29 DIAGNOSIS — Z853 Personal history of malignant neoplasm of breast: Secondary | ICD-10-CM

## 2014-01-29 DIAGNOSIS — M19049 Primary osteoarthritis, unspecified hand: Secondary | ICD-10-CM | POA: Diagnosis present

## 2014-01-29 DIAGNOSIS — B1910 Unspecified viral hepatitis B without hepatic coma: Secondary | ICD-10-CM | POA: Diagnosis not present

## 2014-01-29 DIAGNOSIS — J45909 Unspecified asthma, uncomplicated: Secondary | ICD-10-CM | POA: Diagnosis present

## 2014-01-29 DIAGNOSIS — M899 Disorder of bone, unspecified: Secondary | ICD-10-CM | POA: Diagnosis present

## 2014-01-29 DIAGNOSIS — Z9010 Acquired absence of unspecified breast and nipple: Secondary | ICD-10-CM

## 2014-01-29 DIAGNOSIS — N390 Urinary tract infection, site not specified: Secondary | ICD-10-CM | POA: Diagnosis present

## 2014-01-29 DIAGNOSIS — E785 Hyperlipidemia, unspecified: Secondary | ICD-10-CM | POA: Diagnosis present

## 2014-01-29 DIAGNOSIS — Z6841 Body Mass Index (BMI) 40.0 and over, adult: Secondary | ICD-10-CM | POA: Diagnosis not present

## 2014-01-29 DIAGNOSIS — M1710 Unilateral primary osteoarthritis, unspecified knee: Secondary | ICD-10-CM | POA: Diagnosis not present

## 2014-01-29 DIAGNOSIS — M25569 Pain in unspecified knee: Secondary | ICD-10-CM | POA: Diagnosis not present

## 2014-01-29 DIAGNOSIS — I10 Essential (primary) hypertension: Secondary | ICD-10-CM | POA: Diagnosis not present

## 2014-01-29 DIAGNOSIS — M949 Disorder of cartilage, unspecified: Secondary | ICD-10-CM

## 2014-01-29 DIAGNOSIS — M25559 Pain in unspecified hip: Secondary | ICD-10-CM | POA: Diagnosis not present

## 2014-01-29 DIAGNOSIS — G5600 Carpal tunnel syndrome, unspecified upper limb: Secondary | ICD-10-CM | POA: Diagnosis not present

## 2014-01-29 DIAGNOSIS — M1711 Unilateral primary osteoarthritis, right knee: Secondary | ICD-10-CM | POA: Diagnosis present

## 2014-01-29 HISTORY — PX: KNEE ARTHROPLASTY: SHX992

## 2014-01-29 LAB — APTT: APTT: 25 s (ref 24–37)

## 2014-01-29 SURGERY — ARTHROPLASTY, KNEE, TOTAL, USING IMAGELESS COMPUTER-ASSISTED NAVIGATION
Anesthesia: Spinal | Site: Knee | Laterality: Right

## 2014-01-29 MED ORDER — LISINOPRIL-HYDROCHLOROTHIAZIDE 10-12.5 MG PO TABS: 1.0000 | ORAL_TABLET | Freq: Every morning | ORAL | Status: DC

## 2014-01-29 MED ORDER — ROCURONIUM BROMIDE 50 MG/5ML IV SOLN
INTRAVENOUS | Status: AC
  Filled 2014-01-29: qty 1

## 2014-01-29 MED ORDER — GABAPENTIN 600 MG PO TABS
600.0000 mg | ORAL_TABLET | Freq: Every day | ORAL | Status: DC
  Administered 2014-01-29 – 2014-01-30 (×2): 600 mg via ORAL
  Filled 2014-01-29 (×3): qty 1

## 2014-01-29 MED ORDER — OXYCODONE HCL 5 MG/5ML PO SOLN: 5.0000 mg | Freq: Once | ORAL | Status: DC | PRN

## 2014-01-29 MED ORDER — METHOCARBAMOL 500 MG PO TABS: 500.0000 mg | ORAL_TABLET | Freq: Four times a day (QID) | ORAL | Status: DC | PRN

## 2014-01-29 MED ORDER — LIDOCAINE HCL (CARDIAC) 20 MG/ML IV SOLN
INTRAVENOUS | Status: DC | PRN
Start: 2014-01-29 — End: 2014-01-29
  Administered 2014-01-29: 20 mg via INTRAVENOUS

## 2014-01-29 MED ORDER — KETOROLAC TROMETHAMINE 30 MG/ML IJ SOLN
INTRAMUSCULAR | Status: AC
  Filled 2014-01-29: qty 1

## 2014-01-29 MED ORDER — FLEET ENEMA 7-19 GM/118ML RE ENEM: 1.0000 | ENEMA | Freq: Once | RECTAL | Status: AC | PRN

## 2014-01-29 MED ORDER — ASPIRIN EC 325 MG PO TBEC: 325.0000 mg | DELAYED_RELEASE_TABLET | Freq: Every day | ORAL | Status: DC

## 2014-01-29 MED ORDER — SODIUM CHLORIDE 0.9 % IR SOLN
Status: DC | PRN
  Administered 2014-01-29: 3000 mL

## 2014-01-29 MED ORDER — MIDAZOLAM HCL 5 MG/5ML IJ SOLN
INTRAMUSCULAR | Status: DC | PRN
  Administered 2014-01-29: 2 mg via INTRAVENOUS

## 2014-01-29 MED ORDER — KETOROLAC TROMETHAMINE 15 MG/ML IJ SOLN
15.0000 mg | Freq: Four times a day (QID) | INTRAMUSCULAR | Status: AC
  Administered 2014-01-29 – 2014-01-30 (×4): 15 mg via INTRAVENOUS
  Filled 2014-01-29 (×3): qty 1

## 2014-01-29 MED ORDER — BUPIVACAINE HCL (PF) 0.25 % IJ SOLN
INTRAMUSCULAR | Status: AC
  Filled 2014-01-29: qty 30

## 2014-01-29 MED ORDER — BISACODYL 10 MG RE SUPP: 10.0000 mg | Freq: Every day | RECTAL | Status: DC | PRN

## 2014-01-29 MED ORDER — DOCUSATE SODIUM 100 MG PO CAPS
100.0000 mg | ORAL_CAPSULE | Freq: Two times a day (BID) | ORAL | Status: DC
  Administered 2014-01-29 – 2014-01-31 (×4): 100 mg via ORAL
  Filled 2014-01-29 (×5): qty 1

## 2014-01-29 MED ORDER — ONDANSETRON HCL 4 MG/2ML IJ SOLN
INTRAMUSCULAR | Status: DC | PRN
  Administered 2014-01-29: 4 mg via INTRAVENOUS

## 2014-01-29 MED ORDER — METOCLOPRAMIDE HCL 5 MG/ML IJ SOLN: 5.0000 mg | Freq: Three times a day (TID) | INTRAMUSCULAR | Status: DC | PRN

## 2014-01-29 MED ORDER — PROPOFOL 10 MG/ML IV BOLUS
INTRAVENOUS | Status: AC
  Filled 2014-01-29: qty 20

## 2014-01-29 MED ORDER — ASPIRIN EC 325 MG PO TBEC
325.0000 mg | DELAYED_RELEASE_TABLET | Freq: Every day | ORAL | Status: DC
  Administered 2014-01-30 – 2014-01-31 (×2): 325 mg via ORAL
  Filled 2014-01-29 (×3): qty 1

## 2014-01-29 MED ORDER — ONDANSETRON HCL 4 MG/2ML IJ SOLN
4.0000 mg | Freq: Four times a day (QID) | INTRAMUSCULAR | Status: DC | PRN
  Filled 2014-01-29: qty 2

## 2014-01-29 MED ORDER — HYDROCHLOROTHIAZIDE 12.5 MG PO CAPS
12.5000 mg | ORAL_CAPSULE | Freq: Every day | ORAL | Status: DC
  Administered 2014-01-30: 12.5 mg via ORAL
  Filled 2014-01-29 (×3): qty 1

## 2014-01-29 MED ORDER — HYDROMORPHONE HCL PF 1 MG/ML IJ SOLN
0.5000 mg | INTRAMUSCULAR | Status: DC | PRN
  Administered 2014-01-29 – 2014-01-30 (×8): 0.5 mg via INTRAVENOUS
  Filled 2014-01-29 (×8): qty 1

## 2014-01-29 MED ORDER — ONDANSETRON HCL 4 MG/2ML IJ SOLN
INTRAMUSCULAR | Status: AC
  Filled 2014-01-29: qty 2

## 2014-01-29 MED ORDER — SODIUM CHLORIDE 0.9 % IJ SOLN
INTRAMUSCULAR | Status: AC
  Filled 2014-01-29: qty 10

## 2014-01-29 MED ORDER — LISINOPRIL 10 MG PO TABS
10.0000 mg | ORAL_TABLET | Freq: Every day | ORAL | Status: DC
  Filled 2014-01-29 (×3): qty 1

## 2014-01-29 MED ORDER — BUPIVACAINE HCL (PF) 0.75 % IJ SOLN
INTRAMUSCULAR | Status: DC | PRN
  Administered 2014-01-29: 1.6 mL via INTRATHECAL

## 2014-01-29 MED ORDER — TRAMADOL HCL 50 MG PO TABS
100.0000 mg | ORAL_TABLET | Freq: Every day | ORAL | Status: DC
  Administered 2014-01-29 – 2014-01-30 (×2): 100 mg via ORAL
  Filled 2014-01-29 (×2): qty 2

## 2014-01-29 MED ORDER — LIDOCAINE HCL (CARDIAC) 20 MG/ML IV SOLN
INTRAVENOUS | Status: AC
  Filled 2014-01-29: qty 5

## 2014-01-29 MED ORDER — TRAZODONE HCL 50 MG PO TABS
50.0000 mg | ORAL_TABLET | Freq: Every day | ORAL | Status: DC
  Administered 2014-01-29 – 2014-01-30 (×2): 50 mg via ORAL
  Filled 2014-01-29 (×3): qty 1

## 2014-01-29 MED ORDER — ONDANSETRON HCL 4 MG/2ML IJ SOLN: 4.0000 mg | Freq: Four times a day (QID) | INTRAMUSCULAR | Status: DC | PRN

## 2014-01-29 MED ORDER — METHOCARBAMOL 500 MG PO TABS
ORAL_TABLET | ORAL | Status: AC
  Filled 2014-01-29: qty 1

## 2014-01-29 MED ORDER — FENTANYL CITRATE 0.05 MG/ML IJ SOLN
INTRAMUSCULAR | Status: AC
  Administered 2014-01-29: 25 ug via INTRAVENOUS
  Filled 2014-01-29: qty 2

## 2014-01-29 MED ORDER — MENTHOL 3 MG MT LOZG: 1.0000 | LOZENGE | OROMUCOSAL | Status: DC | PRN

## 2014-01-29 MED ORDER — SENNOSIDES-DOCUSATE SODIUM 8.6-50 MG PO TABS: 1.0000 | ORAL_TABLET | Freq: Every evening | ORAL | Status: DC | PRN

## 2014-01-29 MED ORDER — PHENYLEPHRINE HCL 10 MG/ML IJ SOLN
10.0000 mg | INTRAVENOUS | Status: DC | PRN
  Administered 2014-01-29: 25 ug/min via INTRAVENOUS

## 2014-01-29 MED ORDER — METHOCARBAMOL 1000 MG/10ML IJ SOLN: 500.0000 mg | Freq: Four times a day (QID) | INTRAMUSCULAR | Status: DC | PRN

## 2014-01-29 MED ORDER — OXYCODONE HCL 5 MG PO TABS: 5.0000 mg | ORAL_TABLET | Freq: Once | ORAL | Status: DC | PRN

## 2014-01-29 MED ORDER — PROPOFOL 10 MG/ML IV BOLUS
INTRAVENOUS | Status: DC | PRN
  Administered 2014-01-29: 20 mg via INTRAVENOUS

## 2014-01-29 MED ORDER — FENTANYL CITRATE 0.05 MG/ML IJ SOLN
INTRAMUSCULAR | Status: DC | PRN
  Administered 2014-01-29: 50 ug via INTRAVENOUS

## 2014-01-29 MED ORDER — FLUTICASONE PROPIONATE 50 MCG/ACT NA SUSP
2.0000 | Freq: Every morning | NASAL | Status: DC
  Administered 2014-01-30 – 2014-01-31 (×2): 2 via NASAL
  Filled 2014-01-29: qty 16

## 2014-01-29 MED ORDER — PHENYLEPHRINE 40 MCG/ML (10ML) SYRINGE FOR IV PUSH (FOR BLOOD PRESSURE SUPPORT)
PREFILLED_SYRINGE | INTRAVENOUS | Status: AC
  Filled 2014-01-29: qty 10

## 2014-01-29 MED ORDER — FENTANYL CITRATE 0.05 MG/ML IJ SOLN
INTRAMUSCULAR | Status: AC
  Filled 2014-01-29: qty 5

## 2014-01-29 MED ORDER — DULOXETINE HCL 60 MG PO CPEP
120.0000 mg | ORAL_CAPSULE | Freq: Every morning | ORAL | Status: DC
  Administered 2014-01-30 – 2014-01-31 (×2): 120 mg via ORAL
  Filled 2014-01-29 (×2): qty 2

## 2014-01-29 MED ORDER — METHOCARBAMOL 500 MG PO TABS
500.0000 mg | ORAL_TABLET | Freq: Four times a day (QID) | ORAL | Status: DC | PRN
  Administered 2014-01-29 – 2014-01-31 (×6): 500 mg via ORAL
  Filled 2014-01-29 (×5): qty 1

## 2014-01-29 MED ORDER — PROPOFOL INFUSION 10 MG/ML OPTIME
INTRAVENOUS | Status: DC | PRN
  Administered 2014-01-29: 13:00:00 via INTRAVENOUS
  Administered 2014-01-29: 50 ug/kg/min via INTRAVENOUS

## 2014-01-29 MED ORDER — LACTATED RINGERS IV SOLN
INTRAVENOUS | Status: DC
Start: 2014-01-29 — End: 2014-01-29
  Administered 2014-01-29: 10:00:00 via INTRAVENOUS

## 2014-01-29 MED ORDER — ACETAMINOPHEN 325 MG PO TABS
650.0000 mg | ORAL_TABLET | Freq: Four times a day (QID) | ORAL | Status: DC | PRN
Start: 2014-01-29 — End: 2014-01-31
  Administered 2014-01-29: 650 mg via ORAL
  Filled 2014-01-29: qty 2

## 2014-01-29 MED ORDER — METOCLOPRAMIDE HCL 10 MG PO TABS: 5.0000 mg | ORAL_TABLET | Freq: Three times a day (TID) | ORAL | Status: DC | PRN

## 2014-01-29 MED ORDER — BUPIVACAINE HCL (PF) 0.25 % IJ SOLN
INTRAMUSCULAR | Status: DC | PRN
  Administered 2014-01-29: 10 mL

## 2014-01-29 MED ORDER — PHENYLEPHRINE HCL 10 MG/ML IJ SOLN
INTRAMUSCULAR | Status: DC | PRN
Start: 2014-01-29 — End: 2014-01-29
  Administered 2014-01-29: 80 ug via INTRAVENOUS
  Administered 2014-01-29: 40 ug via INTRAVENOUS
  Administered 2014-01-29 (×2): 80 ug via INTRAVENOUS
  Administered 2014-01-29: 40 ug via INTRAVENOUS
  Administered 2014-01-29: 80 ug via INTRAVENOUS

## 2014-01-29 MED ORDER — OLOPATADINE HCL 0.6 % NA SOLN: 1.0000 | Freq: Every evening | NASAL | Status: DC | PRN

## 2014-01-29 MED ORDER — OXYCODONE HCL 5 MG PO TABS
ORAL_TABLET | ORAL | Status: AC
  Filled 2014-01-29: qty 2

## 2014-01-29 MED ORDER — LACTATED RINGERS IV SOLN
INTRAVENOUS | Status: DC | PRN
  Administered 2014-01-29 (×2): via INTRAVENOUS

## 2014-01-29 MED ORDER — CEFAZOLIN SODIUM-DEXTROSE 2-3 GM-% IV SOLR
INTRAVENOUS | Status: AC
  Administered 2014-01-29: 2 g via INTRAVENOUS
  Filled 2014-01-29: qty 50

## 2014-01-29 MED ORDER — PHENOL 1.4 % MT LIQD: 1.0000 | OROMUCOSAL | Status: DC | PRN

## 2014-01-29 MED ORDER — NYSTATIN 100000 UNIT/GM EX POWD
Freq: Three times a day (TID) | CUTANEOUS | Status: DC
  Administered 2014-01-29 – 2014-01-31 (×5): via TOPICAL
  Filled 2014-01-29: qty 15

## 2014-01-29 MED ORDER — DARIFENACIN HYDROBROMIDE ER 7.5 MG PO TB24
7.5000 mg | ORAL_TABLET | Freq: Every day | ORAL | Status: DC
  Administered 2014-01-30 – 2014-01-31 (×2): 7.5 mg via ORAL
  Filled 2014-01-29 (×3): qty 1

## 2014-01-29 MED ORDER — OXYCODONE HCL 5 MG PO TABS
5.0000 mg | ORAL_TABLET | ORAL | Status: DC | PRN
  Administered 2014-01-29 – 2014-01-31 (×13): 10 mg via ORAL
  Filled 2014-01-29 (×13): qty 2

## 2014-01-29 MED ORDER — EPHEDRINE SULFATE 50 MG/ML IJ SOLN
INTRAMUSCULAR | Status: AC
  Filled 2014-01-29: qty 1

## 2014-01-29 MED ORDER — MIDAZOLAM HCL 2 MG/2ML IJ SOLN
INTRAMUSCULAR | Status: AC
  Filled 2014-01-29: qty 2

## 2014-01-29 MED ORDER — FENTANYL CITRATE 0.05 MG/ML IJ SOLN
25.0000 ug | INTRAMUSCULAR | Status: DC | PRN
  Administered 2014-01-29: 50 ug via INTRAVENOUS
  Administered 2014-01-29: 25 ug via INTRAVENOUS

## 2014-01-29 MED ORDER — LORATADINE 10 MG PO TABS
10.0000 mg | ORAL_TABLET | Freq: Every day | ORAL | Status: DC
  Administered 2014-01-30 – 2014-01-31 (×2): 10 mg via ORAL
  Filled 2014-01-29 (×2): qty 1

## 2014-01-29 MED ORDER — EPHEDRINE SULFATE 50 MG/ML IJ SOLN
INTRAMUSCULAR | Status: DC | PRN
  Administered 2014-01-29 (×4): 5 mg via INTRAVENOUS

## 2014-01-29 MED ORDER — ONDANSETRON HCL 4 MG PO TABS: 4.0000 mg | ORAL_TABLET | Freq: Four times a day (QID) | ORAL | Status: DC | PRN

## 2014-01-29 MED ORDER — OXYCODONE-ACETAMINOPHEN 5-325 MG PO TABS: 1.0000 | ORAL_TABLET | ORAL | Status: DC | PRN

## 2014-01-29 MED ORDER — KCL IN DEXTROSE-NACL 20-5-0.45 MEQ/L-%-% IV SOLN
INTRAVENOUS | Status: DC
  Administered 2014-01-29 – 2014-01-31 (×5): via INTRAVENOUS
  Filled 2014-01-29 (×8): qty 1000

## 2014-01-29 MED ORDER — ACETAMINOPHEN 650 MG RE SUPP: 650.0000 mg | Freq: Four times a day (QID) | RECTAL | Status: DC | PRN

## 2014-01-29 SURGICAL SUPPLY — 69 items
BANDAGE ELASTIC 4 VELCRO ST LF (GAUZE/BANDAGES/DRESSINGS) ×3 IMPLANT
BANDAGE ELASTIC 6 VELCRO ST LF (GAUZE/BANDAGES/DRESSINGS) ×3 IMPLANT
BANDAGE ESMARK 6X9 LF (GAUZE/BANDAGES/DRESSINGS) ×1 IMPLANT
BENZOIN TINCTURE PRP APPL 2/3 (GAUZE/BANDAGES/DRESSINGS) ×3 IMPLANT
BLADE SAGITTAL 25.0X1.19X90 (BLADE) ×2 IMPLANT
BLADE SAGITTAL 25.0X1.19X90MM (BLADE) ×1
BLADE SAW SGTL 13X75X1.27 (BLADE) ×3 IMPLANT
BNDG ELASTIC 6X10 VLCR STRL LF (GAUZE/BANDAGES/DRESSINGS) ×3 IMPLANT
BNDG ESMARK 6X9 LF (GAUZE/BANDAGES/DRESSINGS) ×3
BOWL SMART MIX CTS (DISPOSABLE) ×3 IMPLANT
CAPT RP KNEE ×3 IMPLANT
CEMENT HV SMART SET (Cement) ×6 IMPLANT
CLOSURE STERI-STRIP 1/2X4 (GAUZE/BANDAGES/DRESSINGS) ×1
CLOSURE WOUND 1/2 X4 (GAUZE/BANDAGES/DRESSINGS) ×2
CLSR STERI-STRIP ANTIMIC 1/2X4 (GAUZE/BANDAGES/DRESSINGS) ×2 IMPLANT
COVER BACK TABLE 24X17X13 BIG (DRAPES) IMPLANT
COVER SURGICAL LIGHT HANDLE (MISCELLANEOUS) ×3 IMPLANT
CUFF TOURNIQUET SINGLE 34IN LL (TOURNIQUET CUFF) ×3 IMPLANT
CUFF TOURNIQUET SINGLE 44IN (TOURNIQUET CUFF) IMPLANT
DRAPE ORTHO SPLIT 77X108 STRL (DRAPES) ×4
DRAPE SURG ORHT 6 SPLT 77X108 (DRAPES) ×2 IMPLANT
DRAPE U-SHAPE 47X51 STRL (DRAPES) ×3 IMPLANT
DRSG PAD ABDOMINAL 8X10 ST (GAUZE/BANDAGES/DRESSINGS) ×3 IMPLANT
DURAPREP 26ML APPLICATOR (WOUND CARE) ×3 IMPLANT
ELECT REM PT RETURN 9FT ADLT (ELECTROSURGICAL) ×3
ELECTRODE REM PT RTRN 9FT ADLT (ELECTROSURGICAL) ×1 IMPLANT
FACESHIELD WRAPAROUND (MASK) ×3 IMPLANT
GAUZE XEROFORM 5X9 LF (GAUZE/BANDAGES/DRESSINGS) ×3 IMPLANT
GLOVE BIOGEL PI IND STRL 7.5 (GLOVE) ×1 IMPLANT
GLOVE BIOGEL PI IND STRL 8 (GLOVE) ×1 IMPLANT
GLOVE BIOGEL PI INDICATOR 7.5 (GLOVE) ×2
GLOVE BIOGEL PI INDICATOR 8 (GLOVE) ×2
GLOVE ECLIPSE 7.0 STRL STRAW (GLOVE) ×3 IMPLANT
GLOVE ORTHO TXT STRL SZ7.5 (GLOVE) ×3 IMPLANT
GOWN STRL REUS W/ TWL LRG LVL3 (GOWN DISPOSABLE) ×3 IMPLANT
GOWN STRL REUS W/ TWL XL LVL3 (GOWN DISPOSABLE) ×1 IMPLANT
GOWN STRL REUS W/TWL LRG LVL3 (GOWN DISPOSABLE) ×6
GOWN STRL REUS W/TWL XL LVL3 (GOWN DISPOSABLE) ×2
HANDPIECE INTERPULSE COAX TIP (DISPOSABLE) ×2
IMMOBILIZER KNEE 22 UNIV (SOFTGOODS) ×3 IMPLANT
KIT BASIN OR (CUSTOM PROCEDURE TRAY) ×3 IMPLANT
KIT ROOM TURNOVER OR (KITS) ×3 IMPLANT
MANIFOLD NEPTUNE II (INSTRUMENTS) ×3 IMPLANT
MARKER SPHERE PSV REFLC THRD 5 (MARKER) ×9 IMPLANT
NEEDLE 1/2 CIR MAYO (NEEDLE) ×3 IMPLANT
NEEDLE HYPO 25GX1X1/2 BEV (NEEDLE) ×3 IMPLANT
NS IRRIG 1000ML POUR BTL (IV SOLUTION) ×3 IMPLANT
PACK TOTAL JOINT (CUSTOM PROCEDURE TRAY) ×3 IMPLANT
PAD ARMBOARD 7.5X6 YLW CONV (MISCELLANEOUS) ×6 IMPLANT
PAD CAST 4YDX4 CTTN HI CHSV (CAST SUPPLIES) ×1 IMPLANT
PADDING CAST COTTON 4X4 STRL (CAST SUPPLIES) ×2
PADDING CAST COTTON 6X4 STRL (CAST SUPPLIES) ×3 IMPLANT
PENCIL BUTTON HOLSTER BLD 10FT (ELECTRODE) ×3 IMPLANT
PIN SCHANZ 4MM 130MM (PIN) ×12 IMPLANT
SET HNDPC FAN SPRY TIP SCT (DISPOSABLE) ×1 IMPLANT
SPONGE GAUZE 4X4 12PLY (GAUZE/BANDAGES/DRESSINGS) ×3 IMPLANT
STAPLER VISISTAT 35W (STAPLE) ×3 IMPLANT
STRIP CLOSURE SKIN 1/2X4 (GAUZE/BANDAGES/DRESSINGS) ×4 IMPLANT
SUCTION FRAZIER TIP 10 FR DISP (SUCTIONS) ×3 IMPLANT
SUT ETHIBOND NAB CT1 #1 30IN (SUTURE) ×3 IMPLANT
SUT VIC AB 2-0 CT1 27 (SUTURE) ×4
SUT VIC AB 2-0 CT1 TAPERPNT 27 (SUTURE) ×2 IMPLANT
SUT VICRYL 4-0 PS2 18IN ABS (SUTURE) ×3 IMPLANT
SUT VICRYL AB 2 0 TIES (SUTURE) ×3 IMPLANT
SYR CONTROL 10ML LL (SYRINGE) ×3 IMPLANT
TOWEL OR 17X24 6PK STRL BLUE (TOWEL DISPOSABLE) ×3 IMPLANT
TOWEL OR 17X26 10 PK STRL BLUE (TOWEL DISPOSABLE) ×3 IMPLANT
TRAY FOLEY CATH 16FRSI W/METER (SET/KITS/TRAYS/PACK) ×3 IMPLANT
WATER STERILE IRR 1000ML POUR (IV SOLUTION) ×9 IMPLANT

## 2014-01-29 NOTE — Anesthesia Procedure Notes (Addendum)
Spinal  Patient location during procedure: OR Start time: 01/29/2014 11:50 AM End time: 01/29/2014 11:54 AM Staffing Anesthesiologist: HODIERNE, ADAM Performed by: anesthesiologist  Preanesthetic Checklist Completed: patient identified, site marked, surgical consent, pre-op evaluation, timeout performed, IV checked, risks and benefits discussed and monitors and equipment checked Spinal Block Patient position: sitting Prep: ChloraPrep and site prepped and draped Patient monitoring: heart rate, cardiac monitor, continuous pulse ox and blood pressure Approach: midline Location: L3-4 Injection technique: single-shot Needle Needle type: Sprotte  Needle gauge: 24 G Needle length: 9 cm Assessment Sensory level: T6 Additional Notes Pt tolerated the procedure well.  Procedure Name: MAC Date/Time: 01/29/2014 11:58 AM Performed by: Jenne Campus Pre-anesthesia Checklist: Patient identified, Emergency Drugs available, Suction available, Patient being monitored and Timeout performed Patient Re-evaluated:Patient Re-evaluated prior to inductionOxygen Delivery Method: Simple face mask

## 2014-01-29 NOTE — Brief Op Note (Cosign Needed)
01/29/2014  2:14 PM  PATIENT:  [name]  68 y.o. female  PRE-OPERATIVE DIAGNOSIS:  Right Knee Osteoarthritis  POST-OPERATIVE DIAGNOSIS:  Right Knee Osteoarthritis  PROCEDURE:  Procedure(s) with comments: COMPUTER ASSISTED TOTAL KNEE ARTHROPLASTY (Right) - Right Total Knee Arthroplasty  SURGEON:  Surgeon(s) and Role:    * Marybelle Killings, MD - Primary  PHYSICIAN ASSISTANT: Delman Cheadle  ASSISTANTS: none   ANESTHESIA:   spinal  EBL:  Total I/O In: 1700 [I.V.:1700] Out: 180 [Urine:150; Blood:30]  BLOOD ADMINISTERED:none  DRAINS: none   LOCAL MEDICATIONS USED:  MARCAINE     SPECIMEN:  No Specimen  DISPOSITION OF SPECIMEN:  N/A  COUNTS:  YES  TOURNIQUET:   Total Tourniquet Time Documented: Thigh (Right) - -[id number] minutes Total: Thigh (Right) - -[id number] minutes   DICTATION: .Note written in EPIC  PLAN OF CARE: Admit to inpatient   PATIENT DISPOSITION:  PACU - hemodynamically stable.   Delay start of Pharmacological VTE agent (>24hrs) due to surgical blood loss or risk of bleeding: yes

## 2014-01-29 NOTE — Progress Notes (Signed)
Orthopedic Tech Progress Note Patient Details:  [name] [birth year] [id number] CPM applied to RLE with appropriate settings CPM Right Knee CPM Right Knee: On Right Knee Flexion (Degrees): 60 Right Knee Extension (Degrees): 0   [name] 01/29/2014, 4:33 PM

## 2014-01-29 NOTE — Progress Notes (Signed)
Physical Therapy Evaluation Patient Details Name: [name] MRN: [id number] DOB: November 18, 1945 Today's Date: 01/29/2014   History of Present Illness  Patient is a 68 yo female admitted 01/29/14 now s/p Rt TKA.  Patient with h/o Lt TKA, HTN, breast CA, fibromyalgia.  Clinical Impression  Patient presents with problems listed below.  Will benefit from acute PT to maximize independence prior to discharge home with daughter.    Follow Up Recommendations Home health PT;Supervision/Assistance - 24 hour    Equipment Recommendations  None recommended by PT    Recommendations for Other Services       Precautions / Restrictions Precautions Precautions: Knee Precaution Booklet Issued: Yes (comment) Precaution Comments: Reviewed precautions with patient. Required Braces or Orthoses: Knee Immobilizer - Right Knee Immobilizer - Right: On when out of bed or walking;Discontinue once straight leg raise with < 10 degree lag Restrictions Weight Bearing Restrictions: Yes RLE Weight Bearing: Weight bearing as tolerated      Mobility  Bed Mobility Overal bed mobility: Needs Assistance Bed Mobility: Supine to Sit;Sit to Supine     Supine to sit: Min assist Sit to supine: Min assist   General bed mobility comments: Instructed patient on donning KI on RLE.  Verbal cues for bed mobility technique.  Assist to move RLE off of and onto bed for transitions.  Patient sat at EOB x 12 minutes with good balance.  Transfers                    Ambulation/Gait                Stairs            Wheelchair Mobility    Modified Rankin (Stroke Patients Only)       Balance                                             Pertinent Vitals/Pain Pain 7/10, limiting mobiity    Home Living Family/patient expects to be discharged to:: Private residence Living Arrangements: Alone Available Help at Discharge: Family;Available 24 hours/day (Daughter) Type of Home:  House Home Access: Stairs to enter Entrance Stairs-Rails: Right Entrance Stairs-Number of Steps: 2 Home Layout: Two level;Able to live on main level with bedroom/bathroom Home Equipment: Gilford Rile - 2 wheels;Bedside commode      Prior Function Level of Independence: Independent               Hand Dominance        Extremity/Trunk Assessment   Upper Extremity Assessment: Overall WFL for tasks assessed           Lower Extremity Assessment: RLE deficits/detail RLE Deficits / Details: Decreased knee ROM and strength due to surgery/pain       Communication   Communication: No difficulties  Cognition Arousal/Alertness: Awake/alert Behavior During Therapy: WFL for tasks assessed/performed Overall Cognitive Status: Within Functional Limits for tasks assessed                      General Comments      Exercises Total Joint Exercises Ankle Circles/Pumps: AROM;Both;10 reps;Supine Quad Sets: AROM;Right;5 reps;Supine      Assessment/Plan    PT Assessment Patient needs continued PT services  PT Diagnosis Difficulty walking;Acute pain   PT Problem List Decreased strength;Decreased range of motion;Decreased activity tolerance;Decreased balance;Decreased mobility;Decreased knowledge of use  of DME;Decreased knowledge of precautions;Pain;Obesity  PT Treatment Interventions     PT Goals (Current goals can be found in the Care Plan section) Acute Rehab PT Goals Patient Stated Goal: To get home to her dog PT Goal Formulation: With patient Time For Goal Achievement: 02/05/14 Potential to Achieve Goals: Good    Frequency 7X/week   Barriers to discharge        Co-evaluation               End of Session Equipment Utilized During Treatment: Right knee immobilizer Activity Tolerance: Patient limited by pain Patient left: in bed;with call bell/phone within reach Nurse Communication: Mobility status         Time: 1850-1921 PT Time Calculation (min): 31  min   Charges:   PT Evaluation $Initial PT Evaluation Tier I: 1 Procedure PT Treatments $Therapeutic Activity: 23-37 mins   PT G Codes:          [name] 21-Feb-2014, 8:28 PM [name], [name] Pager [phone]

## 2014-01-29 NOTE — Progress Notes (Signed)
Report rec'd from [facility], Assuming care of patient

## 2014-01-29 NOTE — Plan of Care (Signed)
Problem: Consults Goal: Diagnosis- Total Joint Replacement Primary Total Knee Right     

## 2014-01-29 NOTE — Anesthesia Preprocedure Evaluation (Addendum)
Anesthesia Evaluation  Patient identified by MRN, date of birth, ID band Patient awake    Reviewed: Allergy & Precautions, H&P , NPO status , Patient's Chart, lab work & pertinent test results  History of Anesthesia Complications (+) PONV and history of anesthetic complications  Airway Mallampati: II  Neck ROM: full    Dental   Pulmonary asthma ,          Cardiovascular hypertension,     Neuro/Psych Depression  Neuromuscular disease    GI/Hepatic (+) Hepatitis -, B  Endo/Other  Morbid obesity  Renal/GU      Musculoskeletal  (+) Arthritis -, Fibromyalgia -  Abdominal   Peds  Hematology   Anesthesia Other Findings   Reproductive/Obstetrics                          Anesthesia Physical Anesthesia Plan  ASA: III  Anesthesia Plan: Spinal   Post-op Pain Management:    Induction: Intravenous  Airway Management Planned: Simple Face Mask  Additional Equipment:   Intra-op Plan:   Post-operative Plan:   Informed Consent: I have reviewed the patients History and Physical, chart, labs and discussed the procedure including the risks, benefits and alternatives for the proposed anesthesia with the patient or authorized representative who has indicated his/her understanding and acceptance.     Plan Discussed with: CRNA, Anesthesiologist and Surgeon  Anesthesia Plan Comments:        Anesthesia Quick Evaluation

## 2014-01-29 NOTE — Discharge Instructions (Signed)
Keep knee incision dry for 5 days post op then may wet while bathing. Therapy daily and  goal full extension and greater than 90 degrees flexion. Call if fever or chills or increased drainage. Go to ER if acutely short of breath or call for ambulance. Return for follow up in 2 weeks. May full weight bear on the surgical leg unless told otherwise. Use knee immobilizer until able to straight leg raise off bed with knee stable. In house walking for first 2 weeks. Ice packs as needed for pain and swelling

## 2014-01-29 NOTE — Transfer of Care (Signed)
Immediate Anesthesia Transfer of Care Note  Patient: [name]  Procedure(s) Performed: Procedure(s) with comments: COMPUTER ASSISTED TOTAL KNEE ARTHROPLASTY (Right) - Right Total Knee Arthroplasty  Patient Location: PACU  Anesthesia Type:Spinal  Level of Consciousness: awake, alert , oriented and patient cooperative  Airway & Oxygen Therapy: Patient Spontanous Breathing and Patient connected to nasal cannula oxygen  Post-op Assessment: Report given to PACU RN and Post -op Vital signs reviewed and stable  Post vital signs: Reviewed  Complications: No apparent anesthesia complications

## 2014-01-29 NOTE — Progress Notes (Signed)
Orthopedic Tech Progress Note Patient Details:  [name] 29-Aug-1946 [id number] Footsie roll supplied for patient. Ortho is currently out of CPMs. Ruby Cola with [employer] has been contacted for CPM delivery.  Ortho Devices Type of Ortho Device: Other (comment) Ortho Device/Splint Location: Footsie Roll Ortho Device/Splint Interventions: Ordered   [place] 01/29/2014, 2:55 PM

## 2014-01-29 NOTE — Interval H&P Note (Signed)
History and Physical Interval Note:  01/29/2014 11:40 AM  [name]  has presented today for surgery, with the diagnosis of Right Knee Osteoarthritis  The various methods of treatment have been discussed with the patient and family. After consideration of risks, benefits and other options for treatment, the patient has consented to  Procedure(s) with comments: COMPUTER ASSISTED TOTAL KNEE ARTHROPLASTY (Right) - Right Total Knee Arthroplasty as a surgical intervention .  The patient's history has been reviewed, patient examined, no change in status, stable for surgery.  I have reviewed the patient's chart and labs.  Questions were answered to the patient's satisfaction.     Marybelle Killings

## 2014-01-30 ENCOUNTER — Encounter (HOSPITAL_COMMUNITY): Payer: Self-pay | Admitting: Orthopaedic Surgery

## 2014-01-30 LAB — BASIC METABOLIC PANEL
BUN: 19 mg/dL (ref 6–23)
CALCIUM: 8.3 mg/dL — AB (ref 8.4–10.5)
CO2: 26 meq/L (ref 19–32)
Chloride: 101 mEq/L (ref 96–112)
Creatinine, Ser: 0.85 mg/dL (ref 0.50–1.10)
GFR calc Af Amer: 80 mL/min — ABNORMAL LOW (ref >90)
GFR, EST NON AFRICAN AMERICAN: 69 mL/min — AB (ref >90)
Glucose, Bld: 143 mg/dL — ABNORMAL HIGH (ref 70–99)
Potassium: 4.7 mEq/L (ref 3.7–5.3)
Sodium: 135 mEq/L — ABNORMAL LOW (ref 137–147)

## 2014-01-30 LAB — CBC
HCT: 34.8 % — ABNORMAL LOW (ref 36.0–46.0)
Hemoglobin: 11.2 g/dL — ABNORMAL LOW (ref 12.0–15.0)
MCH: 29.6 pg (ref 26.0–34.0)
MCHC: 32.2 g/dL (ref 30.0–36.0)
MCV: 91.8 fL (ref 78.0–100.0)
Platelets: 229 10*3/uL (ref 150–400)
RBC: 3.79 MIL/uL — ABNORMAL LOW (ref 3.87–5.11)
RDW: 13.7 % (ref 11.5–15.5)
WBC: 9.8 10*3/uL (ref 4.0–10.5)

## 2014-01-30 LAB — URINE MICROSCOPIC-ADD ON

## 2014-01-30 LAB — URINALYSIS, ROUTINE W REFLEX MICROSCOPIC
GLUCOSE, UA: NEGATIVE mg/dL
Ketones, ur: NEGATIVE mg/dL
LEUKOCYTES UA: NEGATIVE
NITRITE: NEGATIVE
PROTEIN: 30 mg/dL — AB
Specific Gravity, Urine: 1.03 (ref 1.005–1.030)
UROBILINOGEN UA: 1 mg/dL (ref 0.0–1.0)
pH: 5.5 (ref 5.0–8.0)

## 2014-01-30 MED ORDER — CIPROFLOXACIN HCL 500 MG PO TABS
500.0000 mg | ORAL_TABLET | Freq: Two times a day (BID) | ORAL | Status: DC
  Administered 2014-01-30 – 2014-01-31 (×2): 500 mg via ORAL
  Filled 2014-01-30 (×4): qty 1

## 2014-01-30 NOTE — Op Note (Signed)
Chandler, Chandler               ACCOUNT NO.:  [id number]  MEDICAL RECORD NO.:  [id number]  LOCATION:  5N12C                        FACILITY:  [facility]  PHYSICIAN:  [name] C. [name], M.D.    DATE OF BIRTH:  Jun 24, 1946  DATE OF PROCEDURE:  01/29/2014 DATE OF DISCHARGE:                              OPERATIVE REPORT   PREOPERATIVE DIAGNOSIS:  Right knee osteoarthritis.  POSTOPERATIVE DIAGNOSIS:  Right knee osteoarthritis.  PROCEDURE:  Right total knee arthroplasty, computer assist.  SURGEON:  [name] C. [name], M.D.  ASSISTANT:  Janet Hay, PA-C, medically necessary and present for the entire procedure.  DRAINS:  None.  INDICATIONS:  This 68 year old female who has had progressive knee osteoarthritis, varus deformity, has had previous femur fracture, has a retained intramedullary nail with screws in the femoral neck from previous accident and computer-assisted navigation is used since intramedullary alignment is blocked by the intramedullary nail in the femur.  ANESTHESIA:  Spinal plus 10 mL local.  DESCRIPTION OF PROCEDURE:  After induction of spinal anesthesia with the patient's history of problems with nausea and vomiting  after anesthesia, the patient received 2 g Ancef prophylaxis.  Proximal thigh tourniquet was applied after a Foley catheter was placed and she had a Marcaine long-acting spinal.  Legs were prepped with DuraPrep.  The usual extremity sheets, drapes, impervious stockinette, Coban, sterile skin marker, Betadine, and Steri-Drape was applied.  Midline incision was made after leg was wrapped in Esmarch, tourniquet placed to 350.  A total tourniquet time was 1 hour and 10 minutes.  Midline incision was made.  Subcutaneous adipose thick layer was divided down to the patella. Medial parapatellar incision was made.  Patella was everted, held parallel to the floor and 10 mm were taken removing bone from facet to facet with an oscillating saw.  A 38 trial sizers with 3  holes drilled. Spurs were taken off the femur, there is bone-on-bone changes, both medial and lateral compartment and varus deformity.  Medial scalloping the posterior aspect of the medial tibial plateau.  Minimal remnant left of the meniscus from degenerative wear.  ACL was sacrificed, meniscal rim was trimmed.  Computer pins were placed in mid tibia with stab incision and inside the incision with the femur with care taking to bias them medially to avoid the intramedullary nail that was present, 8 mm were taken off the high side on the femur, 5 degree cut was made off the anatomic axis.  Tibia was cut with 0 degree slope.  Care was taken to make sure that there was no varus or valgus.  A 9 mm was taken off the tibia.  Flexion-extension blocks showed there is little more tightness still present medially with the patient's varus deformity.  The patient had 5 degree flexion contracture and was in 15 degrees of varus preoperatively.  Once AP cut on the tibia had been made, keel cuts were prepared, sizing was 2.5 on the tibia, 3 on the femur, 10 mm poly showed still slightly tighter medial than lateral and medial collateral ligament was stripped and upper portion of the __________ still tight, spurs removed off the medial aspect.  All meniscal remnants were resected and posterior spurs  which were removed off the medial femoral condyle using three-quarter curved osteotome and mallet with the knee in flexion maximally.  Pulse lavage, vacuum mixing of the cement.  Computer pins were removed. The tibia was cemented first.  All excess cement was removed.  Next was the femur followed by patella 10 mm permanent bearing was inserted. Knee reached full extension.  There was a good balance __________ tighter medially than laterally with collateral ligaments.  Once cement was hardened for 15 minutes, tourniquet was deflated.  Hemostasis was obtained.  Standard layered closure.  Marcaine was infiltrated  in subcuticular closure.  Tincture of benzoin, Steri-Strips, and knee immobilizer.  Instrument count and needle count was correct.     [name] C. [name], M.D.     MCY/MEDQ  D:  01/29/2014  T:  01/30/2014  Job:  [id number]

## 2014-01-30 NOTE — Anesthesia Postprocedure Evaluation (Signed)
Anesthesia Post Note  Patient: [name]  Procedure(s) Performed: Procedure(s) (LRB): COMPUTER ASSISTED TOTAL KNEE ARTHROPLASTY (Right)  Anesthesia type: General  Patient location: PACU  Post pain: Pain level controlled and Adequate analgesia  Post assessment: Post-op Vital signs reviewed, Patient's Cardiovascular Status Stable, Respiratory Function Stable, Patent Airway and Pain level controlled  Last Vitals:  Filed Vitals:   01/30/14 0542  BP: 103/69  Pulse: 85  Temp: 36.9 C  Resp: 16    Post vital signs: Reviewed and stable  Level of consciousness: awake, alert  and oriented  Complications: No apparent anesthesia complications

## 2014-01-30 NOTE — Progress Notes (Signed)
Utilization review completed.  

## 2014-01-30 NOTE — Progress Notes (Signed)
Physician on call paged regarding patient's urine output of 125cc overnight. Per Dr. Ninfa Linden, the on call physician, nursing should leave the foley catheter in place until Dr. Lorin Mercy rounds today. Patient is receiving IV fluids along with taking sips of water intermittently. Patient's vital signs stable at this time. Nursing will continue to monitor.

## 2014-01-30 NOTE — Progress Notes (Signed)
Patient ID: [name], female   DOB: 03/13/1946, 68 y.o.   MRN: [id number] Urine culture from last week pos for E.coli. Repeat UA done this AM and still with some bacteria and some blood and mucus.  Neg leukocyte and nitrite.  Will treat with cipro 500mg  po bid times 5 days for UTI coverage in face of new  TKA. Marland Kitchen

## 2014-01-30 NOTE — Progress Notes (Signed)
Orthopedic Tech Progress Note Patient Details:  [name] [birth year] [id number]  Patient ID: [name], female   DOB: [birth year], 68 y.o.   MRN: [id number] Placed pt's rle in cpm @ 0-55 degrees @ 5830; will increase as pt tolerates; rn notified  Calub Tarnow 01/30/2014, 7:53 PM

## 2014-01-30 NOTE — Evaluation (Signed)
Occupational Therapy Evaluation Patient Details Name: [name] MRN: [id number] DOB: 1946/01/01 Today's Date: 01/30/2014    History of Present Illness Patient is a 68 yo female admitted 01/29/14 now s/p Rt TKA.  Patient with h/o Lt TKA, HTN, breast CA, fibromyalgia.   Clinical Impression   This 68 yo female admitted and underwent above presents to acute OT with increased pain, decreased mobility and  Obesity thus affecting pt's ability to care for herself. She will benefit from acute OT without OT followup.       Follow Up Recommendations  No OT follow up    Equipment Recommendations  None recommended by OT       Precautions / Restrictions Precautions Precautions: Knee Precaution Comments: Reviewed precautions with patient. Required Braces or Orthoses: Knee Immobilizer - Right Knee Immobilizer - Right: On when out of bed or walking;Discontinue once straight leg raise with < 10 degree lag Restrictions RLE Weight Bearing: Weight bearing as tolerated      Mobility Bed Mobility Overal bed mobility: Needs Assistance       Supine to sit: Min assist     General bed mobility comments: S to scoot herself up in bed once we laid the bed flat and cued her to use her LLE to push and both arms to pull herself up in bed.  Transfers Overall transfer level: Needs assistance Equipment used: Rolling walker (2 wheeled) Transfers: Sit to/from Stand Sit to Stand: Mod assist         General transfer comment: A to power up into standing. Cues for safe technique and hand placement. Patient using both hands on Rw. Cues to turn all the way back to chair prior to sitting         ADL                                         General ADL Comments: Limited eval today due to increased pain in RLE with any movement this PM, basically only thing pt did was bed mobility. Pt required Max to raise RLE to place KI and later to remove KI, apply CPM, and then remove CPM  machine due to causing too much pain.               Pertinent Vitals/Pain 7/10 in knee; repositioned, cold applied, RN made aware        Extremity/Trunk Assessment Upper Extremity Assessment Upper Extremity Assessment: Overall WFL for tasks assessed           Communication Communication Communication: No difficulties   Cognition Arousal/Alertness: Awake/alert Behavior During Therapy: WFL for tasks assessed/performed Overall Cognitive Status: Within Functional Limits for tasks assessed                             Home Living Family/patient expects to be discharged to:: Private residence Living Arrangements: Alone Available Help at Discharge: Family;Available 24 hours/day (daughter) Type of Home: House Home Access: Stairs to enter [employer] of Steps: 2 Entrance Stairs-Rails: Right Home Layout: Two level;Able to live on main level with bedroom/bathroom     Bathroom Shower/Tub: Occupational psychologist: Standard     Home Equipment: Environmental consultant - 2 wheels;Bedside commode;Shower seat;Adaptive equipment Adaptive Equipment: Reacher        Prior Functioning/Environment Level of Independence: Independent  OT Diagnosis: Generalized weakness;Acute pain   OT Problem List: Decreased range of motion;Decreased activity tolerance;Pain;Impaired balance (sitting and/or standing);Decreased knowledge of use of DME or AE;Obesity   OT Treatment/Interventions: Self-care/ADL training;DME and/or AE instruction;Balance training;Patient/family education    OT Goals(Current goals can be found in the care plan section) Acute Rehab OT Goals OT Goal Formulation: With patient Time For Goal Achievement: 02/06/14  OT Frequency: Min 2X/week              End of Session Nurse Communication:  (increased pain and CPM too painful (we removed))  Activity Tolerance: Patient limited by pain Patient left: in bed;with call bell/phone within reach    Time: 6720-9470 OT Time Calculation (min): 10 min Charges:  OT General Charges $OT Visit: 1 Procedure OT Evaluation $Initial OT Evaluation Tier I: 1 Procedure  Almon Register [phone] 01/30/2014, 3:18 PM

## 2014-01-30 NOTE — Progress Notes (Signed)
Subjective: 1 Day Post-Op Procedure(s) (LRB): COMPUTER ASSISTED TOTAL KNEE ARTHROPLASTY (Right) Patient reports pain as moderate.    Objective: Vital signs in last 24 hours: Temp:  [97.7 F (36.5 C)-98.4 F (36.9 C)] 98.4 F (36.9 C) (05/19 0542) Pulse Rate:  [64-85] 85 (05/19 0542) Resp:  [13-23] 16 (05/19 0542) BP: (99-144)/(48-73) 103/69 mmHg (05/19 0542) SpO2:  [92 %-100 %] 98 % (05/19 0542) Weight:  [110.601 kg (243 lb 13.3 oz)] 110.601 kg (243 lb 13.3 oz) (05/19 0317)  Intake/Output from previous day: 05/18 0701 - 05/19 0700 In: 2060 [P.O.:360; I.V.:1700] Out: 305 [Urine:275; Blood:30] Intake/Output this shift: Total I/O In: 867.5 [P.O.:75; I.V.:792.5] Out: -    Recent Labs  01/30/14 0521  HGB 11.2*    Recent Labs  01/30/14 0521  WBC 9.8  RBC 3.79*  HCT 34.8*  PLT 229    Recent Labs  01/30/14 0521  NA 135*  K 4.7  CL 101  CO2 26  BUN 19  CREATININE 0.85  GLUCOSE 143*  CALCIUM 8.3*   No results found for this basename: LABPT, INR,  in the last 72 hours  Neurologically intact  Assessment/Plan: 1 Day Post-Op Procedure(s) (LRB): COMPUTER ASSISTED TOTAL KNEE ARTHROPLASTY (Right) Up with therapy dressing was changed by nursing staff.  Ready for PT.   [name] 01/30/2014, 8:02 AM

## 2014-01-30 NOTE — Progress Notes (Signed)
Physical Therapy Treatment Patient Details Name: [name] MRN: [id number] DOB: 1945/12/03 Today's Date: 01/30/2014    History of Present Illness Patient is a 68 yo female admitted 01/29/14 now s/p Rt TKA.  Patient with h/o Lt TKA, HTN, breast CA, fibromyalgia.    PT Comments    Patient progressing slowly. Somewhat anxious due to previous experience with her L knee replacement. Patient able to ambulate to chair with several breaks. Will continue to work on ambulation and activity tolerance.   Follow Up Recommendations  Home health PT;Supervision/Assistance - 24 hour     Equipment Recommendations  None recommended by PT    Recommendations for Other Services       Precautions / Restrictions Precautions Precautions: Knee Precaution Comments: Reviewed precautions with patient. Required Braces or Orthoses: Knee Immobilizer - Right Knee Immobilizer - Right: On when out of bed or walking;Discontinue once straight leg raise with < 10 degree lag Restrictions Weight Bearing Restrictions: Yes RLE Weight Bearing: Weight bearing as tolerated    Mobility  Bed Mobility Overal bed mobility: Needs Assistance       Supine to sit: Min assist     General bed mobility comments: A for R LE positioning out of bed. Patient with good trunk control into sitting  Transfers Overall transfer level: Needs assistance Equipment used: Rolling walker (2 wheeled) Transfers: Sit to/from Stand Sit to Stand: Mod assist         General transfer comment: A to power up into standing. Cues for safe technique and hand placement. Patient using both hands on Rw. Cues to turn all the way back to chair prior to sitting  Ambulation/Gait Ambulation/Gait assistance: Min assist Ambulation Distance (Feet): 15 Feet Assistive device: Rolling walker (2 wheeled) Gait Pattern/deviations: Step-to pattern;Decreased step length - right;Decreased step length - left;Shuffle Gait velocity: decreased   General  Gait Details: Patient cued for gait sequence and safe use of RW. Patient with steppage gait. Required several standing rest breaks. Encouraged to increase weight through LEs to offset UE use on RW   Stairs            Wheelchair Mobility    Modified Rankin (Stroke Patients Only)       Balance                                    Cognition Arousal/Alertness: Awake/alert Behavior During Therapy: WFL for tasks assessed/performed Overall Cognitive Status: Within Functional Limits for tasks assessed                      Exercises Total Joint Exercises Quad Sets: AROM;Right;5 reps;10 reps Heel Slides: AAROM;Right;10 reps Hip ABduction/ADduction: AAROM;Right;10 reps Straight Leg Raises: AAROM;Right;10 reps    General Comments        Pertinent Vitals/Pain 7/10 R knee pain. patient repositioned for comfort     Home Living                      Prior Function            PT Goals (current goals can now be found in the care plan section) Progress towards PT goals: Progressing toward goals    Frequency  7X/week    PT Plan Current plan remains appropriate    Co-evaluation             End of Session Equipment Utilized During Treatment:  Right knee immobilizer Activity Tolerance: Patient limited by pain;Patient limited by fatigue Patient left: in chair;with call bell/phone within reach     Time: 1102-1130 PT Time Calculation (min): 28 min  Charges:  $Gait Training: 8-22 mins $Therapeutic Exercise: 8-22 mins                    G Codes:      [name] 01/30/2014, 11:36 AM 01/30/2014 [place] PTA [phone] pager [phone] office

## 2014-01-30 NOTE — Progress Notes (Signed)
PT Cancellation Note  Patient Details Name: [name] MRN: [id number] DOB: [birth year]   Cancelled Treatment:    Reason Eval/Treat Not Completed: Pain limiting ability to participate. Attempted to see patient this afternoon for BID session however, patient stated that she was in increased pain and could not participate. Will follow up in AM.    [place] 01/30/2014, 2:15 PM

## 2014-01-31 LAB — CBC
HEMATOCRIT: 29.5 % — AB (ref 36.0–46.0)
HEMOGLOBIN: 9.9 g/dL — AB (ref 12.0–15.0)
MCH: 30.2 pg (ref 26.0–34.0)
MCHC: 33.6 g/dL (ref 30.0–36.0)
MCV: 89.9 fL (ref 78.0–100.0)
Platelets: 177 10*3/uL (ref 150–400)
RBC: 3.28 MIL/uL — ABNORMAL LOW (ref 3.87–5.11)
RDW: 13.2 % (ref 11.5–15.5)
WBC: 11.9 10*3/uL — ABNORMAL HIGH (ref 4.0–10.5)

## 2014-01-31 MED ORDER — ALPRAZOLAM 0.25 MG PO TABS
0.2500 mg | ORAL_TABLET | Freq: Four times a day (QID) | ORAL | Status: DC | PRN
  Administered 2014-01-31: 0.25 mg via ORAL
  Filled 2014-01-31: qty 1

## 2014-01-31 MED ORDER — CIPROFLOXACIN HCL 500 MG PO TABS: 500.0000 mg | ORAL_TABLET | Freq: Two times a day (BID) | ORAL | Status: DC

## 2014-01-31 NOTE — Progress Notes (Signed)
Occupational Therapy Treatment Patient Details Name: ELTA ANGELL MRN: [id number] DOB: [birth year] Today's Date: 01/31/2014    History of present illness Patient is a 68 yo female admitted 01/29/14 now s/p Rt TKA.  Patient with h/o Lt TKA, HTN, breast CA, fibromyalgia.   OT comments  Pt requires min to min guard assist for ADL transfers.  She is able to manage her clothing and pericare with toileting. Pt much calmer this afternoon and looking forward to going home.  Follow Up Recommendations  No OT follow up    Equipment Recommendations  None recommended by OT    Recommendations for Other Services      Precautions / Restrictions Precautions Precautions: Knee;Fall Required Braces or Orthoses: Knee Immobilizer - Right Knee Immobilizer - Right: On when out of bed or walking;Discontinue once straight leg raise with < 10 degree lag Restrictions RLE Weight Bearing: Weight bearing as tolerated       Mobility Bed Mobility Overal bed mobility: Needs Assistance Bed Mobility: Sit to Supine       Sit to supine: Min assist   General bed mobility comments: for R LE into bed  Transfers Overall transfer level: Needs assistance Equipment used: Rolling walker (2 wheeled) Transfers: Sit to/from Stand Sit to Stand: Min assist         General transfer comment: verbal cues for hand placement    Balance                                   ADL Overall ADL's : Needs assistance/impaired                         Toilet Transfer: Min guard;RW;Ambulation (3 in 1 over toilet) Toilet Transfer Details (indicate cue type and reason): verbal cues to align her body before sitting Toileting- Clothing Manipulation and Hygiene: Min guard;Sit to/from stand Toileting - Clothing Manipulation Details (indicate cue type and reason): managed her own panties     Functional mobility during ADLs: Min guard;Rolling walker General ADL Comments: Pt is familiar with AE for LB ADL.   Has a reacher and bath brush. Daughter will assist as needed.  Educated her on safe footwear.      Vision                     Perception     Praxis      Cognition   Behavior During Therapy: WFL for tasks assessed/performed Overall Cognitive Status: Within Functional Limits for tasks assessed                       Extremity/Trunk Assessment               Exercises     Shoulder Instructions       General Comments      Pertinent Vitals/ Pain       Soreness in R knee, premedicated, iced, repositioned  Home Living                                          Prior Functioning/Environment              Frequency Min 2X/week     Progress Toward Goals  OT Goals(current goals can now be found in  the care plan section)  Progress towards OT goals: Progressing toward goals  Acute Rehab OT Goals Patient Stated Goal: To get home to her dog  Plan Discharge plan remains appropriate    Co-evaluation                 End of Session Equipment Utilized During Treatment: Gait belt;Rolling walker;Right knee immobilizer   Activity Tolerance Patient tolerated treatment well   Patient Left in bed;with call bell/phone within reach   Nurse Communication  (pt urinated)        Time: 0932-3557 OT Time Calculation (min): 29 min  Charges: OT General Charges $OT Visit: 1 Procedure OT Treatments $Self Care/Home Management : 23-37 mins  Haze Boyden Kirstan Fentress 01/31/2014, 2:34 PM [phone]

## 2014-01-31 NOTE — Clinical Documentation Improvement (Signed)
Possible Clinical conditions  Morbid Obesity W/ BMI=41.6  Other condition___________________  Cannot clinically determine _____________  Risk Factors: HTN, Arthritis, Hyperlipidemia   Sign & Symptoms:  Per H&P note:Estimated body mass index is 41.63 kg/(m^2) as calculated from the following:  Height as of 06/09/13: 5' 3.5" (1.613 m).  Weight as of 06/09/13: 108.319 kg (238 lb 12.8 oz).  01/30/14 OT Eval: "presents to acute OT with increased pain, decreased mobility and Obesity thus affecting pt's ability to care for herself. She will benefit from acute OT without OT followup."   Thank You, Philippa Chester ,RN Clinical Documentation Specialist:  [facility] [employer] [facility]

## 2014-01-31 NOTE — Progress Notes (Signed)
Patient ID: [name], female   DOB: [birth year], 68 y.o.   MRN: [id number] Patient slow to mobilize with morbid obesity  BMI 41.6.

## 2014-01-31 NOTE — Progress Notes (Signed)
Agree with PTA.    Raven Furnas, PT [phone]  

## 2014-01-31 NOTE — Progress Notes (Signed)
Subjective: 2 Days Post-Op Procedure(s) (LRB): COMPUTER ASSISTED TOTAL KNEE ARTHROPLASTY (Right) Patient reports pain as moderate.  Having anxiety today.  Uses xanax at home. Has daughter to help at home.  Objective: Vital signs in last 24 hours: Temp:  [97.9 F (36.6 C)-98.5 F (36.9 C)] 98.1 F (36.7 C) (05/20 0503) Pulse Rate:  [85-91] 91 (05/20 0503) Resp:  [16-18] 16 (05/20 0503) BP: (102-131)/(37-66) 115/44 mmHg (05/20 0503) SpO2:  [91 %-94 %] 91 % (05/20 0503)  Intake/Output from previous day: 05/19 0701 - 05/20 0700 In: 4438.3 [P.O.:675; I.V.:3763.3] Out: 3400 [Urine:3400] Intake/Output this shift:     Recent Labs  01/30/14 0521 01/31/14 0700  HGB 11.2* 9.9*    Recent Labs  01/30/14 0521 01/31/14 0700  WBC 9.8 11.9*  RBC 3.79* 3.28*  HCT 34.8* 29.5*  PLT 229 177    Recent Labs  01/30/14 0521  NA 135*  K 4.7  CL 101  CO2 26  BUN 19  CREATININE 0.85  GLUCOSE 143*  CALCIUM 8.3*   No results found for this basename: LABPT, INR,  in the last 72 hours  Neurovascular intact Sensation intact distally Intact pulses distally Dorsiflexion/Plantar flexion intact Incision: no drainage  Assessment/Plan: 2 Days Post-Op Procedure(s) (LRB): COMPUTER ASSISTED TOTAL KNEE ARTHROPLASTY (Right) Discharge home with home health Xanax as needed for anxiety.  Epimenio Foot 01/31/2014, 12:46 PM

## 2014-01-31 NOTE — Care Management Note (Signed)
CARE MANAGEMENT NOTE 01/31/2014  Patient:  NAESHA, BUCKALEW   Account Number:  [id number]  Date Initiated:  01/31/2014  Documentation initiated by:  Ricki Miller  Subjective/Objective Assessment:   68 year old female s/p right total knee arthroplasty.     Action/Plan:   Case manager spoke with patient and daughter concerning home health and DME needs at discharge. Choice offered. Referal called to [facility] [place]. Patient has rolling walker and 3in1. Has family support at discharge.   Anticipated DC Date:  01/31/2014   Anticipated DC Plan:  [facility]  CM consult      [facility] Choice  HOME HEALTH   Choice offered to / List presented to:  C-1 Patient   DME arranged  NA        [place] arranged  [facility]      [place].   Status of service:  Completed, signed off Medicare Important Message given?   (If response is "NO", the following Medicare IM given date fields will be blank) Date Medicare IM given:   Date Additional Medicare IM given:    Discharge Disposition:  [place]  Per UR Regulation:    If discussed at Long Length of Stay Meetings, dates discussed:    Comments:

## 2014-01-31 NOTE — Progress Notes (Signed)
[name] to be D/C'd Home per MD order. Discussed with the patient and all questions fully answered.    Medication List    STOP taking these medications       sulfamethoxazole-trimethoprim 800-160 MG per tablet  Commonly known as:  BACTRIM DS      TAKE these medications       aspirin EC 325 MG tablet  Take 1 tablet (325 mg total) by mouth daily.     calcium gluconate 500 MG tablet  Take 500 mg by mouth daily.     calcium-vitamin D 500-200 MG-UNIT per tablet  Commonly known as:  OSCAL WITH D  Take 1 tablet by mouth at bedtime.     cetirizine 10 MG tablet  Commonly known as:  ZYRTEC  Take 10 mg by mouth every morning.     cholecalciferol 1000 UNITS tablet  Commonly known as:  VITAMIN D  Take 2,000 Units by mouth every morning.     ciprofloxacin 500 MG tablet  Commonly known as:  CIPRO  Take 1 tablet (500 mg total) by mouth 2 (two) times daily.     DULoxetine 60 MG capsule  Commonly known as:  CYMBALTA  Take 120 mg by mouth every morning.     fluticasone 50 MCG/ACT nasal spray  Commonly known as:  FLONASE  Place 2 sprays into the nose every morning.     gabapentin 600 MG tablet  Commonly known as:  NEURONTIN  Take 600 mg by mouth at bedtime.     Krill Oil 300 MG Caps  Take 1 capsule by mouth every morning.     lisinopril-hydrochlorothiazide 10-12.5 MG per tablet  Commonly known as:  PRINZIDE,ZESTORETIC  Take 1 tablet by mouth every morning.     magnesium gluconate 500 MG tablet  Commonly known as:  MAGONATE  Take 500 mg by mouth at bedtime.     methocarbamol 500 MG tablet  Commonly known as:  ROBAXIN  Take 1 tablet (500 mg total) by mouth every 6 (six) hours as needed for muscle spasms (spasm).     multivitamin capsule  Take 1 capsule by mouth every morning.     oxyCODONE-acetaminophen 5-325 MG per tablet  Commonly known as:  ROXICET  Take 1-2 tablets by mouth every 4 (four) hours as needed.     PATANASE 0.6 % Soln  Generic drug:  Olopatadine  HCl  Place 1 puff into the nose at bedtime as needed (for nasal).     solifenacin 5 MG tablet  Commonly known as:  VESICARE  Take 5 mg by mouth every morning.     traMADol 50 MG tablet  Commonly known as:  ULTRAM  Take 100 mg by mouth at bedtime.     traZODone 50 MG tablet  Commonly known as:  DESYREL  Take 50 mg by mouth at bedtime.     tretinoin 0.1 % cream  Commonly known as:  RETIN-A  Apply 1 application topically at bedtime.     VOLTAREN 1 % Gel  Generic drug:  diclofenac sodium  Apply 2 g topically 3 (three) times daily as needed (fopr pain).        VVS, Skin clean, dry and intact without evidence of skin break down, no evidence of skin tears noted.  IV catheter discontinued intact. Site without signs and symptoms of complications. Dressing and pressure applied.  An After Visit Summary was printed and given to the patient.  Patient escorted via [place], and D/C home  via private auto.  [place] Numbers  01/31/2014 5:19 PM

## 2014-01-31 NOTE — Care Management Note (Deleted)
CARE MANAGEMENT NOTE 01/31/2014  Patient:  [name], [name]   Account Number:  [id number]  Date Initiated:  01/31/2014  Documentation initiated by:  Ricki Miller  Subjective/Objective Assessment:   68 year old female s/p right total knee arthroplasty.     Action/Plan:   Patient will require shorterm rehab at [facility]. Social worker [place] notified.   Anticipated DC Date:  02/01/2014   Anticipated DC Plan:  SKILLED NURSING FACILITY  In-house referral  Clinical Social Worker      DC Planning Services  CM consult      Choice offered to / List presented to:             Status of service:  Completed, signed off Medicare Important Message given?   (If response is "NO", the following Medicare IM given date fields will be blank) Date Medicare IM given:   Date Additional Medicare IM given:    Discharge Disposition:  [place]

## 2014-01-31 NOTE — Progress Notes (Signed)
Physical Therapy Treatment Patient Details Name: [name] MRN: [id number] DOB: 1945-10-13 Today's Date: 01/31/2014    History of Present Illness Patient is a 68 yo female admitted 01/29/14 now s/p Rt TKA.  Patient with h/o Lt TKA, HTN, breast CA, fibromyalgia.    PT Comments    Patient is not progressing with therapy this session. Patient able to get to EOB, attempted stand x3 however patient stated she was getting lightheaded and dizzy. Patient appeared to hyperventilate and began to cry. Patient return to supine. BP 138/50 and O2 94% on RA. At this rate, patient will not be safe to return home and would benefit from increased therapy at SNF. Patient agreeable to talk with CSW. RN Case Manager aware and plans on informing CSW. Will continue to follow with mobility progression.   Follow Up Recommendations  SNF;Supervision/Assistance - 24 hour     Equipment Recommendations  None recommended by PT    Recommendations for Other Services       Precautions / Restrictions Precautions Precautions: Knee Required Braces or Orthoses: Knee Immobilizer - Right Knee Immobilizer - Right: On when out of bed or walking;Discontinue once straight leg raise with < 10 degree lag Restrictions Weight Bearing Restrictions: Yes RLE Weight Bearing: Weight bearing as tolerated    Mobility  Bed Mobility Overal bed mobility: Needs Assistance Bed Mobility: Supine to Sit;Sit to Supine     Supine to sit: Min assist        Transfers                 General transfer comment: unable to stand and clear buttocks this session x 3 attempts. Patient became dizzy and appeared to be hyperventilating stating she could catch her breath. Return to sitting. BP taken 138/50. O2 sats were 94 RA.   Ambulation/Gait                 Stairs            Wheelchair Mobility    Modified Rankin (Stroke Patients Only)       Balance                                     Cognition Arousal/Alertness: Awake/alert Behavior During Therapy: WFL for tasks assessed/performed Overall Cognitive Status: Within Functional Limits for tasks assessed                      Exercises Total Joint Exercises Quad Sets: AROM;Right;10 reps Heel Slides: AAROM;Right;10 reps Hip ABduction/ADduction: AAROM;Right;10 reps Straight Leg Raises: AAROM;Right;10 reps    General Comments        Pertinent Vitals/Pain 10/10 pain when attempting to stand. patient repositioned for comfort     Home Living                      Prior Function            PT Goals (current goals can now be found in the care plan section) Progress towards PT goals: Not progressing toward goals - comment    Frequency  7X/week    PT Plan Discharge plan needs to be updated    Co-evaluation             End of Session Equipment Utilized During Treatment: Right knee immobilizer Activity Tolerance: Patient limited by fatigue;Treatment limited secondary to medical complications (Comment) (dizziness and decreased  BP)       Time: 0938-1829 PT Time Calculation (min): 31 min  Charges:  $Therapeutic Exercise: 8-22 mins $Therapeutic Activity: 8-22 mins                    G Codes:      Tonia Brooms Robinette 01/31/2014, 9:51 AM  01/31/2014 [place] PTA [phone] pager [phone] office

## 2014-02-01 DIAGNOSIS — Z96659 Presence of unspecified artificial knee joint: Secondary | ICD-10-CM | POA: Diagnosis not present

## 2014-02-01 DIAGNOSIS — E785 Hyperlipidemia, unspecified: Secondary | ICD-10-CM | POA: Diagnosis not present

## 2014-02-01 DIAGNOSIS — Z853 Personal history of malignant neoplasm of breast: Secondary | ICD-10-CM | POA: Diagnosis not present

## 2014-02-01 DIAGNOSIS — IMO0001 Reserved for inherently not codable concepts without codable children: Secondary | ICD-10-CM | POA: Diagnosis not present

## 2014-02-01 DIAGNOSIS — Z8619 Personal history of other infectious and parasitic diseases: Secondary | ICD-10-CM | POA: Diagnosis not present

## 2014-02-01 DIAGNOSIS — I10 Essential (primary) hypertension: Secondary | ICD-10-CM | POA: Diagnosis not present

## 2014-02-01 DIAGNOSIS — F329 Major depressive disorder, single episode, unspecified: Secondary | ICD-10-CM | POA: Diagnosis not present

## 2014-02-01 DIAGNOSIS — F3289 Other specified depressive episodes: Secondary | ICD-10-CM | POA: Diagnosis not present

## 2014-02-01 DIAGNOSIS — Z9010 Acquired absence of unspecified breast and nipple: Secondary | ICD-10-CM | POA: Diagnosis not present

## 2014-02-01 DIAGNOSIS — Z471 Aftercare following joint replacement surgery: Secondary | ICD-10-CM | POA: Diagnosis not present

## 2014-02-01 DIAGNOSIS — J45909 Unspecified asthma, uncomplicated: Secondary | ICD-10-CM | POA: Diagnosis not present

## 2014-02-03 DIAGNOSIS — F3289 Other specified depressive episodes: Secondary | ICD-10-CM | POA: Diagnosis not present

## 2014-02-03 DIAGNOSIS — I10 Essential (primary) hypertension: Secondary | ICD-10-CM | POA: Diagnosis not present

## 2014-02-03 DIAGNOSIS — F329 Major depressive disorder, single episode, unspecified: Secondary | ICD-10-CM | POA: Diagnosis not present

## 2014-02-03 DIAGNOSIS — J45909 Unspecified asthma, uncomplicated: Secondary | ICD-10-CM | POA: Diagnosis not present

## 2014-02-03 DIAGNOSIS — Z471 Aftercare following joint replacement surgery: Secondary | ICD-10-CM | POA: Diagnosis not present

## 2014-02-03 DIAGNOSIS — IMO0001 Reserved for inherently not codable concepts without codable children: Secondary | ICD-10-CM | POA: Diagnosis not present

## 2014-02-05 DIAGNOSIS — F329 Major depressive disorder, single episode, unspecified: Secondary | ICD-10-CM | POA: Diagnosis not present

## 2014-02-05 DIAGNOSIS — F3289 Other specified depressive episodes: Secondary | ICD-10-CM | POA: Diagnosis not present

## 2014-02-05 DIAGNOSIS — IMO0001 Reserved for inherently not codable concepts without codable children: Secondary | ICD-10-CM | POA: Diagnosis not present

## 2014-02-05 DIAGNOSIS — Z471 Aftercare following joint replacement surgery: Secondary | ICD-10-CM | POA: Diagnosis not present

## 2014-02-05 DIAGNOSIS — J45909 Unspecified asthma, uncomplicated: Secondary | ICD-10-CM | POA: Diagnosis not present

## 2014-02-05 DIAGNOSIS — I10 Essential (primary) hypertension: Secondary | ICD-10-CM | POA: Diagnosis not present

## 2014-02-06 DIAGNOSIS — IMO0001 Reserved for inherently not codable concepts without codable children: Secondary | ICD-10-CM | POA: Diagnosis not present

## 2014-02-06 DIAGNOSIS — F329 Major depressive disorder, single episode, unspecified: Secondary | ICD-10-CM | POA: Diagnosis not present

## 2014-02-06 DIAGNOSIS — Z471 Aftercare following joint replacement surgery: Secondary | ICD-10-CM | POA: Diagnosis not present

## 2014-02-06 DIAGNOSIS — I10 Essential (primary) hypertension: Secondary | ICD-10-CM | POA: Diagnosis not present

## 2014-02-06 DIAGNOSIS — F3289 Other specified depressive episodes: Secondary | ICD-10-CM | POA: Diagnosis not present

## 2014-02-06 DIAGNOSIS — J45909 Unspecified asthma, uncomplicated: Secondary | ICD-10-CM | POA: Diagnosis not present

## 2014-02-06 NOTE — Discharge Summary (Signed)
Physician Discharge Summary  Patient ID: [name] MRN: [id number] DOB/AGE: September 16, 1945 68 y.o.  Admit date: 01/29/2014 Discharge date: 01/31/2014  Admission Diagnoses:  Osteoarthritis of right knee  Discharge Diagnoses:  Principal Problem:   Osteoarthritis of right knee   Past Medical History  Diagnosis Date  . Allergy   . Hypertension   . Hyperlipidemia   . Depression   . Fibromyalgia   . Arthritis     feet, knee and base of thumbs  . Osteopenia   . Carpal tunnel syndrome     right  . Breast cancer, stage 1 07/18/2011  . Dehydration 10/21/2011  . Acne   . Asthma     controlled  . Multiple allergies   . PONV (postoperative nausea and vomiting)     has had to use scop patch  . Hepatitis 1976    hepatitis B  . Acute UTI 01/23/2014    to start ABX 01/24/2014    Surgeries: Procedure(s): COMPUTER ASSISTED TOTAL KNEE ARTHROPLASTY on 01/29/2014   Consultants (if any):  none  Discharged Condition: Improved  Hospital Course: [name] is an 68 y.o. female who was admitted 01/29/2014 with a diagnosis of Osteoarthritis of right knee and went to the operating room on 01/29/2014 and underwent the above named procedures.    She was given perioperative antibiotics:  Anti-infectives   Start     Dose/Rate Route Frequency Ordered Stop   01/31/14 0000  ciprofloxacin (CIPRO) 500 MG tablet     500 mg Oral 2 times daily 01/31/14 0825     01/30/14 1600  ciprofloxacin (CIPRO) tablet 500 mg  Status:  Discontinued    Comments:  Times 5 days for UTI Tx.  Thanks  ( not surgical prophylaxis)   500 mg Oral 2 times daily 01/30/14 1234 01/31/14 2002   01/29/14 0929  ceFAZolin (ANCEF) 2-3 GM-% IVPB SOLR    Comments:  Nyoka Cowden   : cabinet override      01/29/14 0929 01/29/14 1150   01/28/14 0959  ceFAZolin (ANCEF) IVPB 2 g/50 mL premix  Status:  Discontinued     2 g 100 mL/hr over 30 Minutes Intravenous On call to O.R. 01/28/14 0272 01/29/14 1649    .  She was given  sequential compression devices, early ambulation, and aspirin for DVT prophylaxis.  She benefited maximally from the hospital stay and there were no complications.    Recent vital signs:  Filed Vitals:   01/31/14 1335  BP: 121/59  Pulse: 82  Temp: 98.1 F (36.7 C)  Resp: 18    Recent laboratory studies:  Lab Results  Component Value Date   HGB 9.9* 01/31/2014   HGB 11.2* 01/30/2014   HGB 14.0 01/24/2014   Lab Results  Component Value Date   WBC 11.9* 01/31/2014   PLT 177 01/31/2014   Lab Results  Component Value Date   INR 0.87 01/24/2014   Lab Results  Component Value Date   NA 135* 01/30/2014   K 4.7 01/30/2014   CL 101 01/30/2014   CO2 26 01/30/2014   BUN 19 01/30/2014   CREATININE 0.85 01/30/2014   GLUCOSE 143* 01/30/2014    Discharge Medications:     Medication List    STOP taking these medications       sulfamethoxazole-trimethoprim 800-160 MG per tablet  Commonly known as:  BACTRIM DS      TAKE these medications       aspirin EC 325 MG tablet  Take  1 tablet (325 mg total) by mouth daily.     calcium gluconate 500 MG tablet  Take 500 mg by mouth daily.     calcium-vitamin D 500-200 MG-UNIT per tablet  Commonly known as:  OSCAL WITH D  Take 1 tablet by mouth at bedtime.     cetirizine 10 MG tablet  Commonly known as:  ZYRTEC  Take 10 mg by mouth every morning.     cholecalciferol 1000 UNITS tablet  Commonly known as:  VITAMIN D  Take 2,000 Units by mouth every morning.     ciprofloxacin 500 MG tablet  Commonly known as:  CIPRO  Take 1 tablet (500 mg total) by mouth 2 (two) times daily.     DULoxetine 60 MG capsule  Commonly known as:  CYMBALTA  Take 120 mg by mouth every morning.     fluticasone 50 MCG/ACT nasal spray  Commonly known as:  FLONASE  Place 2 sprays into the nose every morning.     gabapentin 600 MG tablet  Commonly known as:  NEURONTIN  Take 600 mg by mouth at bedtime.     Krill Oil 300 MG Caps  Take 1 capsule by mouth  every morning.     lisinopril-hydrochlorothiazide 10-12.5 MG per tablet  Commonly known as:  PRINZIDE,ZESTORETIC  Take 1 tablet by mouth every morning.     magnesium gluconate 500 MG tablet  Commonly known as:  MAGONATE  Take 500 mg by mouth at bedtime.     methocarbamol 500 MG tablet  Commonly known as:  ROBAXIN  Take 1 tablet (500 mg total) by mouth every 6 (six) hours as needed for muscle spasms (spasm).     multivitamin capsule  Take 1 capsule by mouth every morning.     oxyCODONE-acetaminophen 5-325 MG per tablet  Commonly known as:  ROXICET  Take 1-2 tablets by mouth every 4 (four) hours as needed.     PATANASE 0.6 % Soln  Generic drug:  Olopatadine HCl  Place 1 puff into the nose at bedtime as needed (for nasal).     solifenacin 5 MG tablet  Commonly known as:  VESICARE  Take 5 mg by mouth every morning.     traMADol 50 MG tablet  Commonly known as:  ULTRAM  Take 100 mg by mouth at bedtime.     traZODone 50 MG tablet  Commonly known as:  DESYREL  Take 50 mg by mouth at bedtime.     tretinoin 0.1 % cream  Commonly known as:  RETIN-A  Apply 1 application topically at bedtime.     VOLTAREN 1 % Gel  Generic drug:  diclofenac sodium  Apply 2 g topically 3 (three) times daily as needed (fopr pain).        Diagnostic Studies: Dg Chest 2 View  01/24/2014   CLINICAL DATA:  Preoperative films.  EXAM: CHEST  2 VIEW  COMPARISON:  Single view of the chest 06/26/2011.  FINDINGS: Port-A-Cath, soft tissue expander in the right breast and surgical drain the right breast have been removed. The lungs are clear. Heart size is normal. There is no pneumothorax or pleural effusion. Catheter tubing projecting in the left upper abdomen is noted.  IMPRESSION: No acute cardiopulmonary disease.   Electronically Signed   By: Inge Rise M.D.   On: 01/24/2014 14:38    Disposition: 01-Home or Self Care      Discharge Instructions   Call MD / Call 911    Complete by:  As directed    If you experience chest pain or shortness of breath, CALL 911 and be transported to the hospital emergency room.  If you develope a fever above 101 F, pus (white drainage) or increased drainage or redness at the wound, or calf pain, call your surgeon's office.     Constipation Prevention    Complete by:  As directed   Drink plenty of fluids.  Prune juice may be helpful.  You may use a stool softener, such as Colace (over the counter) 100 mg twice a day.  Use MiraLax (over the counter) for constipation as needed.     Diet - low sodium heart healthy    Complete by:  As directed      Discharge instructions    Complete by:  As directed   Keep knee incision dry for 5 days post op then may wet while bathing. Therapy daily and  goal full extension and greater than 90 degrees flexion. Call if fever or chills or increased drainage. Go to ER if acutely short of breath or call for ambulance. Return for follow up in 2 weeks. May full weight bear on the surgical leg unless told otherwise. Use knee immobilizer until able to straight leg raise off bed with knee stable. In house walking for first 2 weeks.     Do not put a pillow under the knee. Place it under the heel.    Complete by:  As directed      Full weight bearing    Complete by:  As directed      Increase activity slowly as tolerated    Complete by:  As directed            Follow-up Information   Follow up with Marybelle Killings, MD. Schedule an appointment as soon as possible for a visit in 2 weeks.   Specialty:  Orthopedic Surgery   Contact information:   [place] [address] [phone]       Follow up with [facility]. (Someone from [facility] will contact you concerning start date and time for physical therapy.)    Contact information:   [address] [place] [address] [phone]        Signed: Epimenio Foot 02/06/2014, 3:29 PM

## 2014-02-07 DIAGNOSIS — F329 Major depressive disorder, single episode, unspecified: Secondary | ICD-10-CM | POA: Diagnosis not present

## 2014-02-07 DIAGNOSIS — J45909 Unspecified asthma, uncomplicated: Secondary | ICD-10-CM | POA: Diagnosis not present

## 2014-02-07 DIAGNOSIS — IMO0001 Reserved for inherently not codable concepts without codable children: Secondary | ICD-10-CM | POA: Diagnosis not present

## 2014-02-07 DIAGNOSIS — I10 Essential (primary) hypertension: Secondary | ICD-10-CM | POA: Diagnosis not present

## 2014-02-07 DIAGNOSIS — Z471 Aftercare following joint replacement surgery: Secondary | ICD-10-CM | POA: Diagnosis not present

## 2014-02-07 DIAGNOSIS — F3289 Other specified depressive episodes: Secondary | ICD-10-CM | POA: Diagnosis not present

## 2014-02-09 DIAGNOSIS — IMO0001 Reserved for inherently not codable concepts without codable children: Secondary | ICD-10-CM | POA: Diagnosis not present

## 2014-02-09 DIAGNOSIS — F329 Major depressive disorder, single episode, unspecified: Secondary | ICD-10-CM | POA: Diagnosis not present

## 2014-02-09 DIAGNOSIS — Z471 Aftercare following joint replacement surgery: Secondary | ICD-10-CM | POA: Diagnosis not present

## 2014-02-09 DIAGNOSIS — F3289 Other specified depressive episodes: Secondary | ICD-10-CM | POA: Diagnosis not present

## 2014-02-09 DIAGNOSIS — J45909 Unspecified asthma, uncomplicated: Secondary | ICD-10-CM | POA: Diagnosis not present

## 2014-02-09 DIAGNOSIS — I10 Essential (primary) hypertension: Secondary | ICD-10-CM | POA: Diagnosis not present

## 2014-02-12 DIAGNOSIS — J45909 Unspecified asthma, uncomplicated: Secondary | ICD-10-CM | POA: Diagnosis not present

## 2014-02-12 DIAGNOSIS — IMO0001 Reserved for inherently not codable concepts without codable children: Secondary | ICD-10-CM | POA: Diagnosis not present

## 2014-02-12 DIAGNOSIS — I10 Essential (primary) hypertension: Secondary | ICD-10-CM | POA: Diagnosis not present

## 2014-02-12 DIAGNOSIS — Z471 Aftercare following joint replacement surgery: Secondary | ICD-10-CM | POA: Diagnosis not present

## 2014-02-12 DIAGNOSIS — F329 Major depressive disorder, single episode, unspecified: Secondary | ICD-10-CM | POA: Diagnosis not present

## 2014-02-12 DIAGNOSIS — F3289 Other specified depressive episodes: Secondary | ICD-10-CM | POA: Diagnosis not present

## 2014-02-13 DIAGNOSIS — M1710 Unilateral primary osteoarthritis, unspecified knee: Secondary | ICD-10-CM | POA: Diagnosis not present

## 2014-02-14 DIAGNOSIS — Z471 Aftercare following joint replacement surgery: Secondary | ICD-10-CM | POA: Diagnosis not present

## 2014-02-14 DIAGNOSIS — F329 Major depressive disorder, single episode, unspecified: Secondary | ICD-10-CM | POA: Diagnosis not present

## 2014-02-14 DIAGNOSIS — J45909 Unspecified asthma, uncomplicated: Secondary | ICD-10-CM | POA: Diagnosis not present

## 2014-02-14 DIAGNOSIS — IMO0001 Reserved for inherently not codable concepts without codable children: Secondary | ICD-10-CM | POA: Diagnosis not present

## 2014-02-14 DIAGNOSIS — I10 Essential (primary) hypertension: Secondary | ICD-10-CM | POA: Diagnosis not present

## 2014-02-14 DIAGNOSIS — F3289 Other specified depressive episodes: Secondary | ICD-10-CM | POA: Diagnosis not present

## 2014-02-15 DIAGNOSIS — F3289 Other specified depressive episodes: Secondary | ICD-10-CM | POA: Diagnosis not present

## 2014-02-15 DIAGNOSIS — J45909 Unspecified asthma, uncomplicated: Secondary | ICD-10-CM | POA: Diagnosis not present

## 2014-02-15 DIAGNOSIS — I10 Essential (primary) hypertension: Secondary | ICD-10-CM | POA: Diagnosis not present

## 2014-02-15 DIAGNOSIS — F329 Major depressive disorder, single episode, unspecified: Secondary | ICD-10-CM | POA: Diagnosis not present

## 2014-02-15 DIAGNOSIS — IMO0001 Reserved for inherently not codable concepts without codable children: Secondary | ICD-10-CM | POA: Diagnosis not present

## 2014-02-15 DIAGNOSIS — Z471 Aftercare following joint replacement surgery: Secondary | ICD-10-CM | POA: Diagnosis not present

## 2014-02-16 DIAGNOSIS — J45909 Unspecified asthma, uncomplicated: Secondary | ICD-10-CM | POA: Diagnosis not present

## 2014-02-16 DIAGNOSIS — Z471 Aftercare following joint replacement surgery: Secondary | ICD-10-CM | POA: Diagnosis not present

## 2014-02-16 DIAGNOSIS — F3289 Other specified depressive episodes: Secondary | ICD-10-CM | POA: Diagnosis not present

## 2014-02-16 DIAGNOSIS — F329 Major depressive disorder, single episode, unspecified: Secondary | ICD-10-CM | POA: Diagnosis not present

## 2014-02-16 DIAGNOSIS — IMO0001 Reserved for inherently not codable concepts without codable children: Secondary | ICD-10-CM | POA: Diagnosis not present

## 2014-02-16 DIAGNOSIS — I10 Essential (primary) hypertension: Secondary | ICD-10-CM | POA: Diagnosis not present

## 2014-02-22 DIAGNOSIS — M25669 Stiffness of unspecified knee, not elsewhere classified: Secondary | ICD-10-CM | POA: Diagnosis not present

## 2014-02-22 DIAGNOSIS — M25569 Pain in unspecified knee: Secondary | ICD-10-CM | POA: Diagnosis not present

## 2014-02-27 DIAGNOSIS — M25669 Stiffness of unspecified knee, not elsewhere classified: Secondary | ICD-10-CM | POA: Diagnosis not present

## 2014-02-27 DIAGNOSIS — M25569 Pain in unspecified knee: Secondary | ICD-10-CM | POA: Diagnosis not present

## 2014-03-01 DIAGNOSIS — M25669 Stiffness of unspecified knee, not elsewhere classified: Secondary | ICD-10-CM | POA: Diagnosis not present

## 2014-03-01 DIAGNOSIS — M25569 Pain in unspecified knee: Secondary | ICD-10-CM | POA: Diagnosis not present

## 2014-03-05 DIAGNOSIS — M25569 Pain in unspecified knee: Secondary | ICD-10-CM | POA: Diagnosis not present

## 2014-03-05 DIAGNOSIS — M25669 Stiffness of unspecified knee, not elsewhere classified: Secondary | ICD-10-CM | POA: Diagnosis not present

## 2014-03-06 ENCOUNTER — Telehealth: Payer: Self-pay | Admitting: Hematology and Oncology

## 2014-03-06 NOTE — Telephone Encounter (Signed)
, °

## 2014-03-08 DIAGNOSIS — M25669 Stiffness of unspecified knee, not elsewhere classified: Secondary | ICD-10-CM | POA: Diagnosis not present

## 2014-03-08 DIAGNOSIS — M25569 Pain in unspecified knee: Secondary | ICD-10-CM | POA: Diagnosis not present

## 2014-03-09 NOTE — OR Nursing (Signed)
Addendum to scope page 

## 2014-03-12 DIAGNOSIS — M25669 Stiffness of unspecified knee, not elsewhere classified: Secondary | ICD-10-CM | POA: Diagnosis not present

## 2014-03-12 DIAGNOSIS — M25569 Pain in unspecified knee: Secondary | ICD-10-CM | POA: Diagnosis not present

## 2014-03-14 DIAGNOSIS — M25569 Pain in unspecified knee: Secondary | ICD-10-CM | POA: Diagnosis not present

## 2014-03-14 DIAGNOSIS — M25669 Stiffness of unspecified knee, not elsewhere classified: Secondary | ICD-10-CM | POA: Diagnosis not present

## 2014-03-15 ENCOUNTER — Other Ambulatory Visit: Payer: BC Managed Care – PPO

## 2014-03-15 ENCOUNTER — Ambulatory Visit: Payer: BC Managed Care – PPO | Admitting: Oncology

## 2014-03-19 DIAGNOSIS — M25569 Pain in unspecified knee: Secondary | ICD-10-CM | POA: Diagnosis not present

## 2014-03-19 DIAGNOSIS — M25669 Stiffness of unspecified knee, not elsewhere classified: Secondary | ICD-10-CM | POA: Diagnosis not present

## 2014-03-22 DIAGNOSIS — M25569 Pain in unspecified knee: Secondary | ICD-10-CM | POA: Diagnosis not present

## 2014-03-22 DIAGNOSIS — M25669 Stiffness of unspecified knee, not elsewhere classified: Secondary | ICD-10-CM | POA: Diagnosis not present

## 2014-03-27 DIAGNOSIS — M76899 Other specified enthesopathies of unspecified lower limb, excluding foot: Secondary | ICD-10-CM | POA: Diagnosis not present

## 2014-03-29 DIAGNOSIS — M25669 Stiffness of unspecified knee, not elsewhere classified: Secondary | ICD-10-CM | POA: Diagnosis not present

## 2014-03-29 DIAGNOSIS — M25569 Pain in unspecified knee: Secondary | ICD-10-CM | POA: Diagnosis not present

## 2014-04-25 DIAGNOSIS — M899 Disorder of bone, unspecified: Secondary | ICD-10-CM | POA: Diagnosis not present

## 2014-05-31 DIAGNOSIS — M1990 Unspecified osteoarthritis, unspecified site: Secondary | ICD-10-CM | POA: Diagnosis not present

## 2014-05-31 DIAGNOSIS — R32 Unspecified urinary incontinence: Secondary | ICD-10-CM | POA: Diagnosis not present

## 2014-05-31 DIAGNOSIS — I10 Essential (primary) hypertension: Secondary | ICD-10-CM | POA: Diagnosis not present

## 2014-05-31 DIAGNOSIS — E785 Hyperlipidemia, unspecified: Secondary | ICD-10-CM | POA: Diagnosis not present

## 2014-05-31 DIAGNOSIS — Z23 Encounter for immunization: Secondary | ICD-10-CM | POA: Diagnosis not present

## 2014-06-11 ENCOUNTER — Other Ambulatory Visit: Payer: BC Managed Care – PPO

## 2014-06-11 ENCOUNTER — Ambulatory Visit: Payer: BC Managed Care – PPO | Admitting: Hematology and Oncology

## 2014-06-11 ENCOUNTER — Telehealth: Payer: Self-pay | Admitting: Hematology and Oncology

## 2014-06-11 NOTE — Telephone Encounter (Signed)
, °

## 2014-07-02 ENCOUNTER — Other Ambulatory Visit: Payer: Self-pay

## 2014-07-02 DIAGNOSIS — N3946 Mixed incontinence: Secondary | ICD-10-CM | POA: Diagnosis not present

## 2014-07-02 DIAGNOSIS — R350 Frequency of micturition: Secondary | ICD-10-CM | POA: Diagnosis not present

## 2014-07-02 DIAGNOSIS — R351 Nocturia: Secondary | ICD-10-CM | POA: Diagnosis not present

## 2014-07-02 DIAGNOSIS — C50919 Malignant neoplasm of unspecified site of unspecified female breast: Secondary | ICD-10-CM

## 2014-07-03 ENCOUNTER — Other Ambulatory Visit: Payer: BC Managed Care – PPO

## 2014-07-03 ENCOUNTER — Ambulatory Visit: Payer: BC Managed Care – PPO | Admitting: Hematology and Oncology

## 2014-07-03 ENCOUNTER — Telehealth: Payer: Self-pay | Admitting: Hematology and Oncology

## 2014-07-03 NOTE — Telephone Encounter (Signed)
, °

## 2014-07-16 ENCOUNTER — Other Ambulatory Visit: Payer: BC Managed Care – PPO

## 2014-07-16 ENCOUNTER — Ambulatory Visit: Payer: BC Managed Care – PPO | Admitting: Hematology and Oncology

## 2014-07-23 DIAGNOSIS — H59092 Other disorders of the left eye following cataract surgery: Secondary | ICD-10-CM | POA: Diagnosis not present

## 2014-07-23 DIAGNOSIS — H25091 Other age-related incipient cataract, right eye: Secondary | ICD-10-CM | POA: Diagnosis not present

## 2014-07-23 DIAGNOSIS — Z961 Presence of intraocular lens: Secondary | ICD-10-CM | POA: Diagnosis not present

## 2014-07-25 DIAGNOSIS — R350 Frequency of micturition: Secondary | ICD-10-CM | POA: Diagnosis not present

## 2014-07-25 DIAGNOSIS — R351 Nocturia: Secondary | ICD-10-CM | POA: Diagnosis not present

## 2014-07-25 DIAGNOSIS — N3946 Mixed incontinence: Secondary | ICD-10-CM | POA: Diagnosis not present

## 2014-07-31 DIAGNOSIS — N3946 Mixed incontinence: Secondary | ICD-10-CM | POA: Diagnosis not present

## 2014-07-31 DIAGNOSIS — R350 Frequency of micturition: Secondary | ICD-10-CM | POA: Diagnosis not present

## 2014-08-17 DIAGNOSIS — Z961 Presence of intraocular lens: Secondary | ICD-10-CM | POA: Diagnosis not present

## 2014-08-17 DIAGNOSIS — H02839 Dermatochalasis of unspecified eye, unspecified eyelid: Secondary | ICD-10-CM | POA: Diagnosis not present

## 2014-08-17 DIAGNOSIS — H26492 Other secondary cataract, left eye: Secondary | ICD-10-CM | POA: Diagnosis not present

## 2014-08-17 DIAGNOSIS — H18411 Arcus senilis, right eye: Secondary | ICD-10-CM | POA: Diagnosis not present

## 2014-08-17 DIAGNOSIS — H2511 Age-related nuclear cataract, right eye: Secondary | ICD-10-CM | POA: Diagnosis not present

## 2015-01-01 ENCOUNTER — Other Ambulatory Visit: Payer: Self-pay

## 2015-01-01 DIAGNOSIS — Z1231 Encounter for screening mammogram for malignant neoplasm of breast: Secondary | ICD-10-CM

## 2015-01-17 ENCOUNTER — Ambulatory Visit: Payer: BC Managed Care – PPO

## 2015-01-23 ENCOUNTER — Ambulatory Visit: Payer: BC Managed Care – PPO

## 2015-05-15 DIAGNOSIS — R309 Painful micturition, unspecified: Secondary | ICD-10-CM | POA: Diagnosis not present

## 2015-05-15 DIAGNOSIS — L309 Dermatitis, unspecified: Secondary | ICD-10-CM | POA: Diagnosis not present

## 2015-05-16 DIAGNOSIS — D485 Neoplasm of uncertain behavior of skin: Secondary | ICD-10-CM | POA: Diagnosis not present

## 2015-05-16 DIAGNOSIS — L309 Dermatitis, unspecified: Secondary | ICD-10-CM | POA: Diagnosis not present

## 2015-05-16 DIAGNOSIS — D2272 Melanocytic nevi of left lower limb, including hip: Secondary | ICD-10-CM | POA: Diagnosis not present

## 2015-05-22 ENCOUNTER — Ambulatory Visit: Payer: BC Managed Care – PPO

## 2015-05-29 DIAGNOSIS — Z23 Encounter for immunization: Secondary | ICD-10-CM | POA: Diagnosis not present

## 2015-05-31 IMAGING — MG MM DIGITAL SCREENING UNILAT*L* W/ CAD
4 series · 4 of 12 positions shown · non-contrast
Comparison: Previous exams.

CLINICAL DATA: Screening.

DIGITAL SCREENING UNILATERAL LEFT MAMMOGRAM WITH CAD
 DIGITAL BREAST TOMOSYNTHESIS
Digital breast tomosynthesis images are acquired in two
projections.  These images are reviewed in combination with the
digital mammogram, confirming the findings below.

[L MLO]
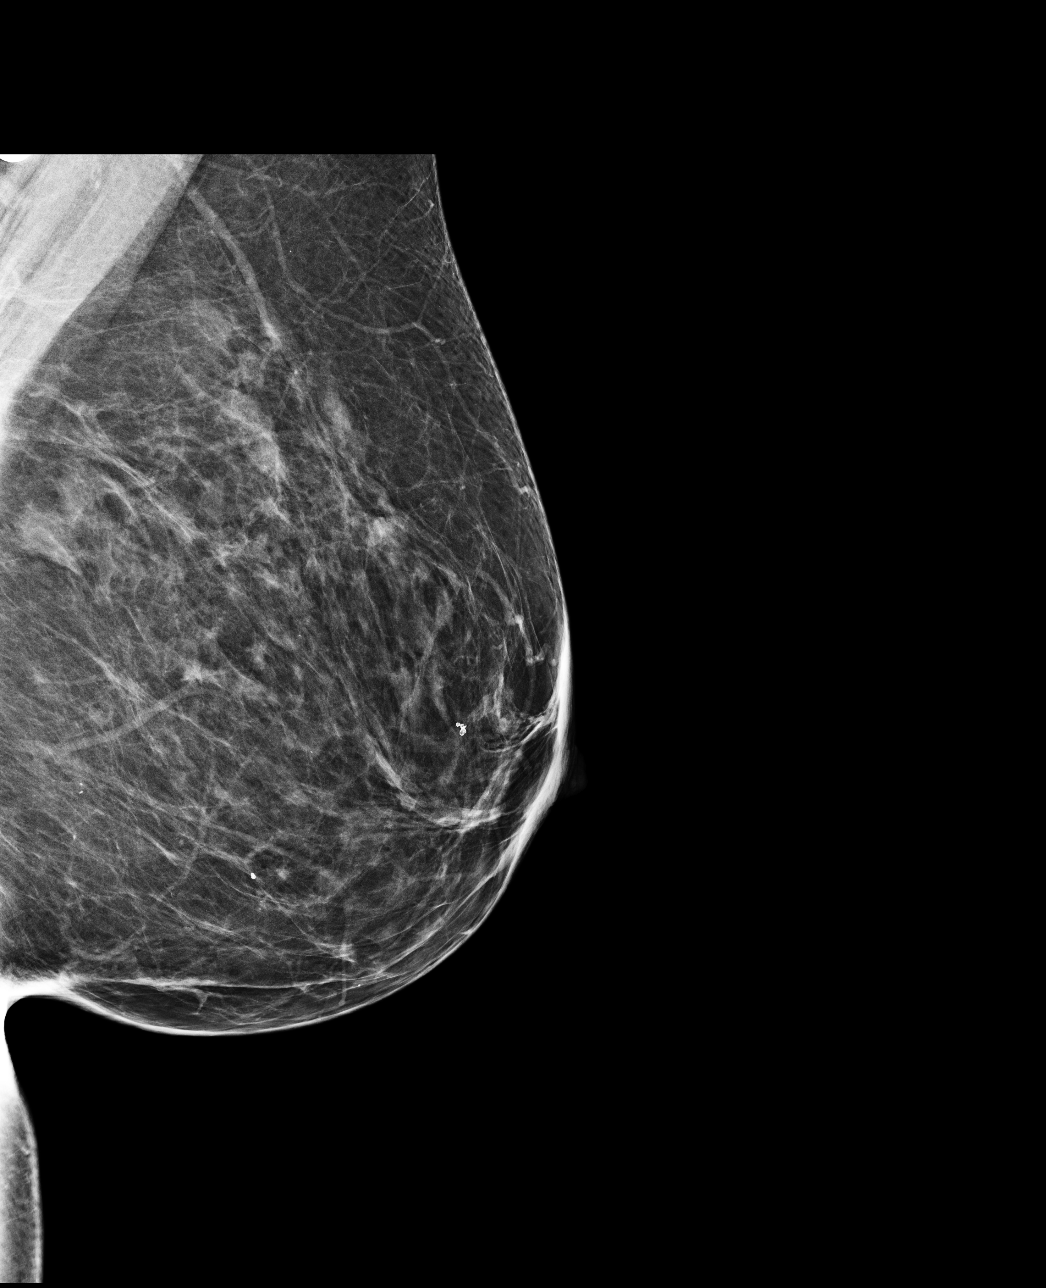

[L CC]
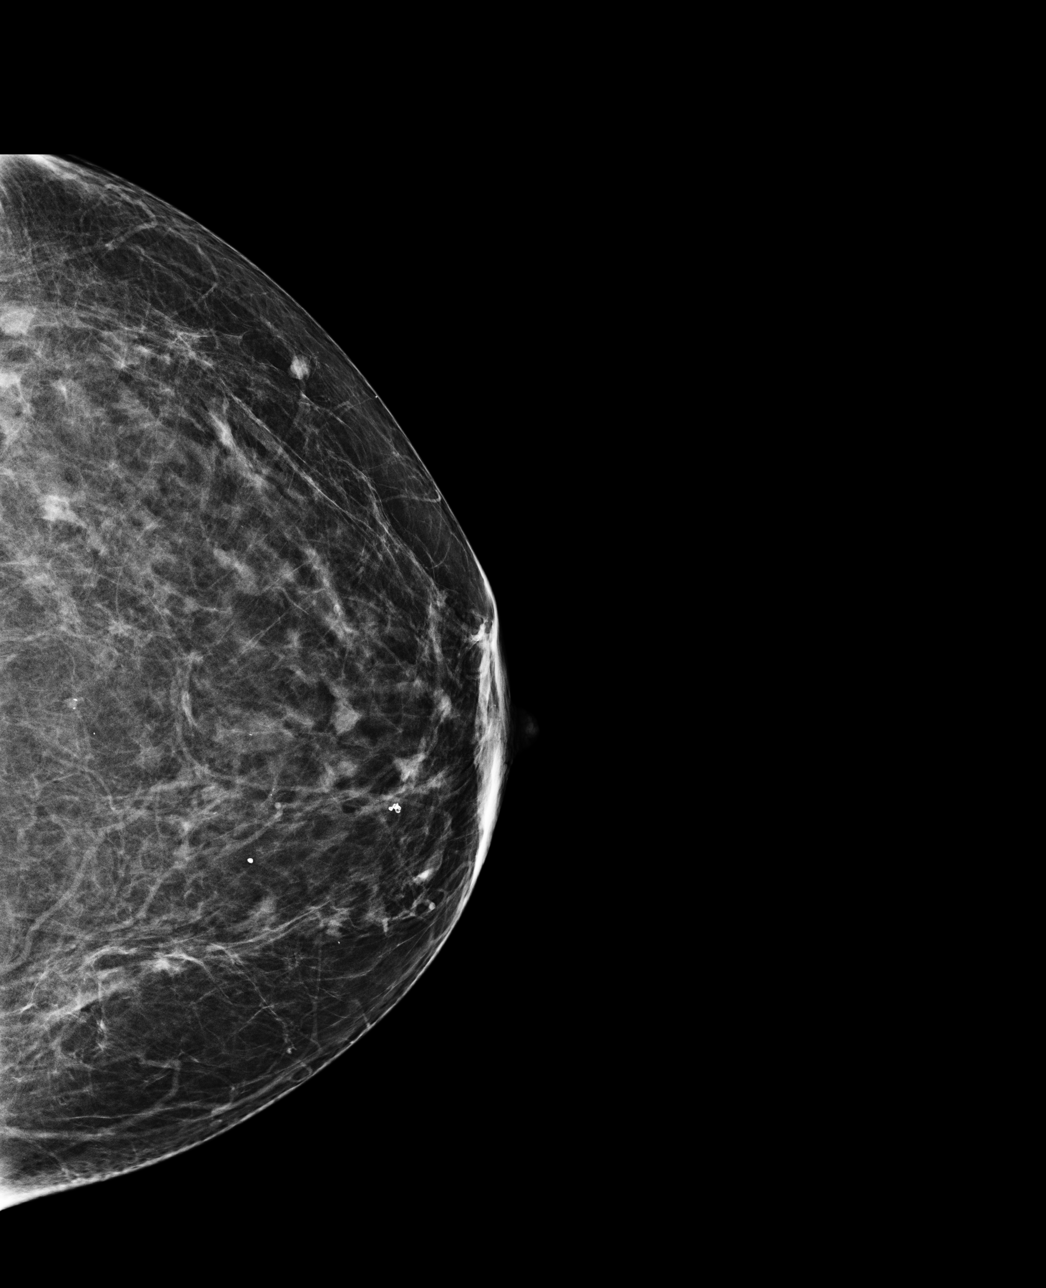

[L CC tomo · tomo slice 35/68.0]
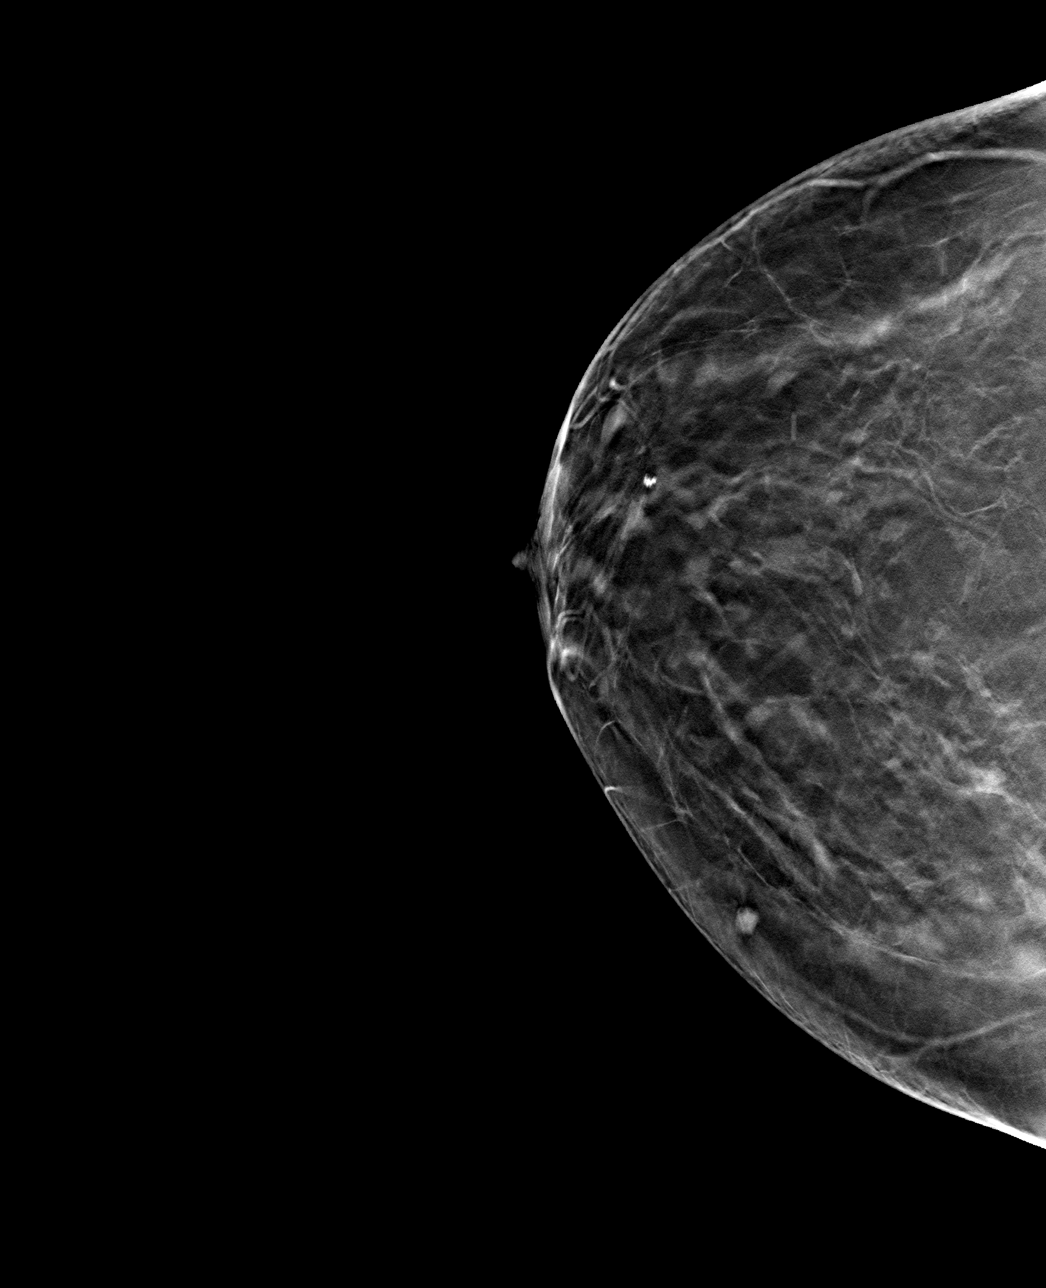

[L MLO tomo · tomo slice 37/73.0]
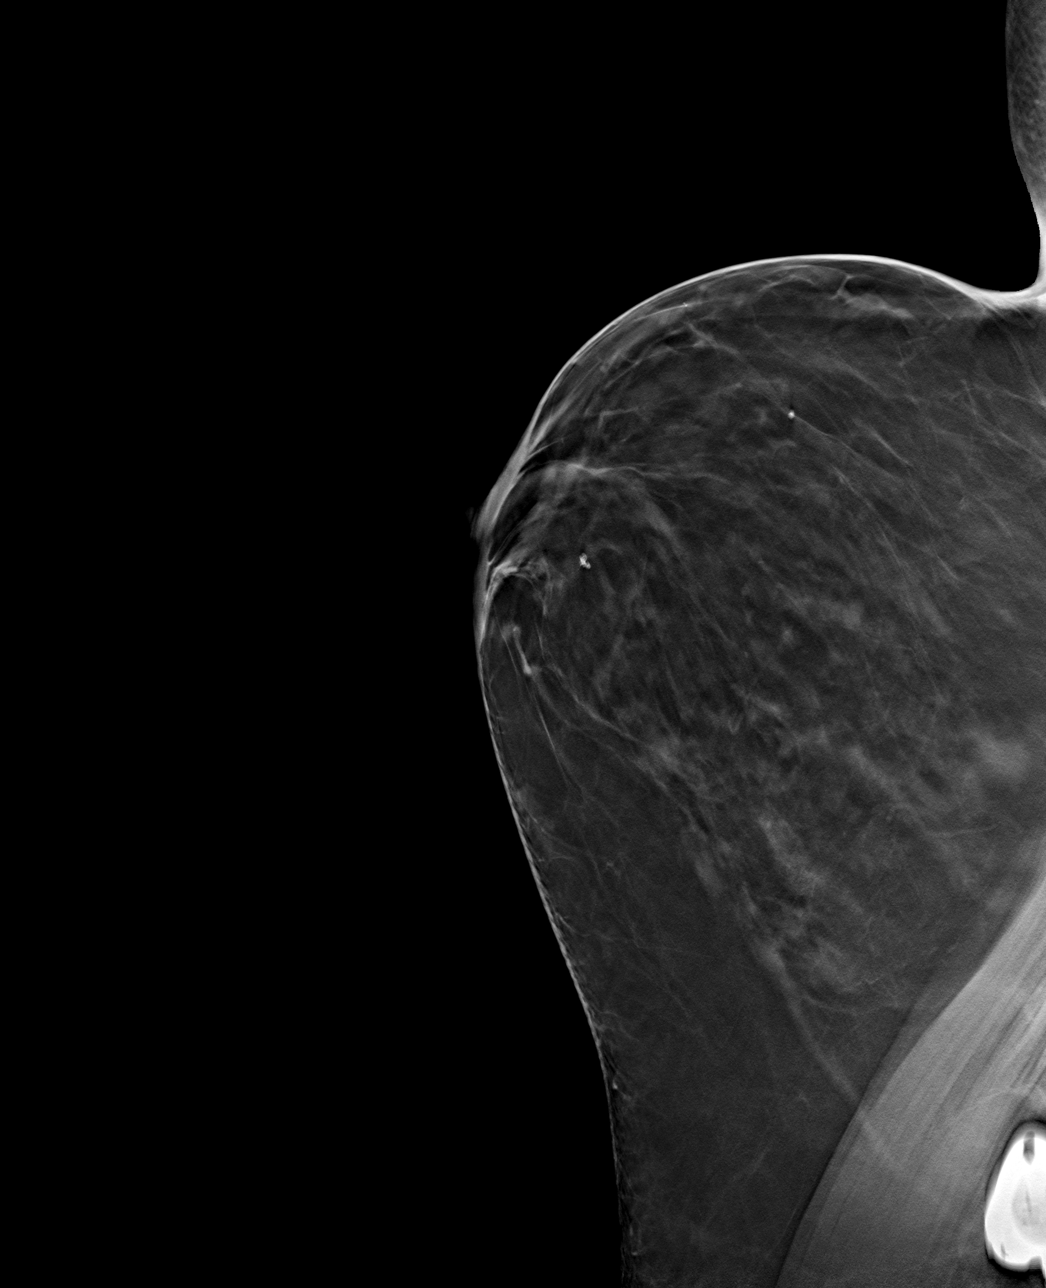

[4 of 12 positions shown; findings below may reference images not displayed]

FINDINGS: ACR Breast Density Category 2: There is a scattered fibroglandular
pattern.

There are no findings suspicious for malignancy.

Images were processed with CAD.
IMPRESSION: No mammographic evidence of malignancy.

A result letter of this screening mammogram will be mailed directly
to the patient.

RECOMMENDATION:
Screening mammogram in one year. (Code:H6-O-M8B)

BI-RADS CATEGORY 1:  Negative.

## 2015-06-24 ENCOUNTER — Other Ambulatory Visit: Payer: Self-pay | Admitting: Family Medicine

## 2015-06-24 DIAGNOSIS — R0602 Shortness of breath: Secondary | ICD-10-CM | POA: Diagnosis not present

## 2015-06-24 DIAGNOSIS — R609 Edema, unspecified: Secondary | ICD-10-CM

## 2015-06-24 DIAGNOSIS — I10 Essential (primary) hypertension: Secondary | ICD-10-CM | POA: Diagnosis not present

## 2015-06-24 DIAGNOSIS — N390 Urinary tract infection, site not specified: Secondary | ICD-10-CM | POA: Diagnosis not present

## 2015-06-24 DIAGNOSIS — R300 Dysuria: Secondary | ICD-10-CM | POA: Diagnosis not present

## 2015-06-25 ENCOUNTER — Ambulatory Visit
Admission: RE | Admit: 2015-06-25 | Discharge: 2015-06-25 | Disposition: A | Discharge status: Home / Self Care | Payer: 59 | Source: Ambulatory Visit | Attending: Family Medicine | Admitting: Family Medicine

## 2015-06-25 ENCOUNTER — Encounter (INDEPENDENT_AMBULATORY_CARE_PROVIDER_SITE_OTHER): Payer: Self-pay

## 2015-06-25 ENCOUNTER — Ambulatory Visit
Admission: RE | Admit: 2015-06-25 | Discharge: 2015-06-25 | Disposition: A | Discharge status: Home / Self Care | Payer: 59 | Source: Ambulatory Visit

## 2015-06-25 DIAGNOSIS — R609 Edema, unspecified: Secondary | ICD-10-CM

## 2015-06-25 DIAGNOSIS — Z1231 Encounter for screening mammogram for malignant neoplasm of breast: Secondary | ICD-10-CM | POA: Diagnosis not present

## 2015-06-25 DIAGNOSIS — R600 Localized edema: Secondary | ICD-10-CM | POA: Diagnosis not present

## 2016-07-21 ENCOUNTER — Encounter (HOSPITAL_COMMUNITY): Payer: Self-pay

## 2016-08-13 ENCOUNTER — Other Ambulatory Visit: Payer: Self-pay | Admitting: Family Medicine

## 2016-08-13 DIAGNOSIS — Z1231 Encounter for screening mammogram for malignant neoplasm of breast: Secondary | ICD-10-CM

## 2016-09-21 ENCOUNTER — Ambulatory Visit
Admission: RE | Admit: 2016-09-21 | Discharge: 2016-09-21 | Disposition: A | Discharge status: Home / Self Care | Payer: Medicare Other | Source: Ambulatory Visit | Attending: Family Medicine | Admitting: Family Medicine

## 2016-09-21 DIAGNOSIS — Z1231 Encounter for screening mammogram for malignant neoplasm of breast: Secondary | ICD-10-CM

## 2016-10-02 ENCOUNTER — Other Ambulatory Visit: Payer: Self-pay | Admitting: Nurse Practitioner

## 2017-06-11 ENCOUNTER — Ambulatory Visit: Payer: Medicare Other | Attending: Family Medicine | Admitting: Physical Therapy

## 2017-06-11 ENCOUNTER — Encounter: Payer: Self-pay | Admitting: Physical Therapy

## 2017-06-11 DIAGNOSIS — M79621 Pain in right upper arm: Secondary | ICD-10-CM | POA: Insufficient documentation

## 2017-06-11 DIAGNOSIS — M6281 Muscle weakness (generalized): Secondary | ICD-10-CM | POA: Diagnosis present

## 2017-06-11 DIAGNOSIS — R293 Abnormal posture: Secondary | ICD-10-CM | POA: Diagnosis present

## 2017-06-11 NOTE — Therapy (Signed)
[facility] [address] [place], [place], [address] Phone: [phone]   Fax:  [phone]  Physical Therapy Evaluation  Patient Details  Name: [name] MRN: [id number] Date of Birth: 1946-04-20 Referring Provider: Maurice Small, MD  Encounter Date: 06/11/2017      PT End of Session - 06/11/17 1205    Visit Number 1   Number of Visits 10   Date for PT Re-Evaluation 07/23/17   Authorization Type [employer] medicare    Authorization Time Period 06/11/17 to 07/23/17   Authorization - Visit Number 1   Authorization - Number of Visits 13   PT Start Time 9622   PT Stop Time 1100   PT Time Calculation (min) 45 min   Activity Tolerance Patient tolerated treatment well;No increased pain   Behavior During Therapy WFL for tasks assessed/performed      Past Medical History:  Diagnosis Date  . Acne   . Acute UTI 01/23/2014   to start ABX 01/24/2014  . Allergy   . Arthritis    feet, knee and base of thumbs  . Asthma    controlled  . Breast cancer, stage 1 ([place]) 07/18/2011  . Carpal tunnel syndrome    right  . Dehydration 10/21/2011  . Depression   . Fibromyalgia   . Hepatitis 1976   hepatitis B  . Hyperlipidemia   . Hypertension   . Multiple allergies   . Osteopenia   . PONV (postoperative nausea and vomiting)    has had to use scop patch    Past Surgical History:  Procedure Laterality Date  . ABDOMINAL HYSTERECTOMY  1981  . BREAST CAPSULECTOMY WITH IMPLANT EXCHANGE Right 03/02/2013   Procedure: BREAST CAPSULECTOMY WITH REPOSITIONING THE IMPLANT;  Surgeon: Theodoro Kos, DO;  Location: [place];  Service: Plastics;  Laterality: Right;  . BREAST SURGERY    . BUNIONECTOMY  1998   left foot  . ETHMOIDECTOMY  1991  . EYE SURGERY    . facial plastic surgery  1996  . JOINT REPLACEMENT Left 2005  . KNEE ARTHROPLASTY Right 01/29/2014   Procedure: COMPUTER ASSISTED TOTAL KNEE ARTHROPLASTY;  Surgeon:  Marybelle Killings, MD;  Location: [place];  Service: Orthopedics;  Laterality: Right;  Right Total Knee Arthroplasty  . KNEE ARTHROSCOPY  1996, 2001   left  . lap band surgery  2010  . LIPOSUCTION Bilateral 03/02/2013   Procedure: LIPOSUCTION;  Surgeon: Theodoro Kos, DO;  Location: [place];  Service: Plastics;  Laterality: Bilateral;  . MASTECTOMY Right 2012   w/chemo  . MASTOPEXY  04/13/2012   Procedure: MASTOPEXY;  Surgeon: Theodoro Kos, DO;  Location: [place];  Service: Plastics;  Laterality: Left;   left breast  mastopexy reduction for symmetry  . NASAL SINUS SURGERY  2001  . port-a-cath placement  06/26/11   tip in lower SVC per chest x-ray read by Dr. Ivar Drape  . PORT-A-CATH REMOVAL Left 03/02/2013   Procedure: REMOVAL PORT-A-CATH;  Surgeon: Rolm Bookbinder, MD;  Location: [place];  Service: General;  Laterality: Left;  . right breast lumpectomy  1986  . right ear surgery  1983   tumor removed (?)  . right mastectomy  06-26-11  . TONSILLECTOMY  1950  . TOTAL KNEE ARTHROPLASTY  2006   left  . TUBAL LIGATION  1978    There were no vitals filed for this visit.       Subjective Assessment -  06/11/17 1022    Subjective Pt reports that she has Rt shoulder pain that was onset randomly one day ~1 month ago when she was pulling the sheet up to make the bed. She noticed a sharp pain in her Rt arm for a couple of minutes until it eased off less than 2-3 min later. She noticed it again a couple of days later when she was reaching over her head for a pillow. She saw her primary care physician on Monday and was referred here for management of her shoulder pain. She denies numbness/tingling down the Rt arm. Will occasionally have neck pain, but this is not anything serious.    Pertinent History Lt and Rt TKA several years ago, HTN, depression, osteopenia, fibromyalgia    Limitations Other (comment)  rotating the arm    How long can you sit  comfortably? unlimited   How long can you stand comfortably? unlimited   How long can you walk comfortably? unlimited   Patient Stated Goals decrease shoulder pain    Currently in Pain? No/denies            [facility] PT Assessment - 06/11/17 0001      Assessment   Medical Diagnosis Rt UE pain    Referring Provider Maurice Small, MD   Onset Date/Surgical Date --  ~1 month ago    Next MD Visit none for this    Prior Therapy none      Balance Screen   Has the patient fallen in the past 6 months No   Has the patient had a decrease in activity level because of a fear of falling?  No   Is the patient reluctant to leave their home because of a fear of falling?  No     Home Ecologist residence     Prior Function   Level of Independence Independent     Cognition   Overall Cognitive Status Within Functional Limits for tasks assessed     Observation/Other Assessments   Focus on Therapeutic Outcomes (FOTO)  41% limited    Other Surveys  Other Surveys   Upper Extremity Functional Index  66/80     Sensation   Light Touch Appears Intact   Additional Comments denied numbness and tingling.      Posture/Postural Control   Posture Comments forward head, rounded shoulders      ROM / Strength   AROM / PROM / Strength AROM;Strength     AROM   Overall AROM Comments Lt shoulder AROM full and pain free    AROM Assessment Site Cervical;Shoulder   Right/Left Shoulder Right;Left   Right Shoulder Flexion --  Full, end range pain   Right Shoulder ABduction --  painful initially to 110 deg, second attempt full    Right Shoulder External Rotation --  full, pain free   Cervical Flexion WNL, pain free   Cervical Extension WNL, pain free   Cervical - Right Side Bend WNL   Cervical - Left Side Bend limited 50%, pain Lt side at end range    Cervical - Right Rotation WNL, pain free   Cervical - Left Rotation 50% limited, pain Lt side at end range      Strength    Strength Assessment Site Shoulder;Elbow   Right/Left Shoulder Right;Left   Right Shoulder Flexion 4+/5   Right Shoulder ABduction 4+/5   Right Shoulder Internal Rotation 5/5   Right Shoulder External Rotation 4/5   Left Shoulder Flexion  5/5   Left Shoulder ABduction 5/5   Left Shoulder Internal Rotation 5/5   Left Shoulder External Rotation 5/5   Right/Left Elbow Right;Left   Right Elbow Flexion 5/5   Right Elbow Extension 4/5   Left Elbow Flexion 5/5   Left Elbow Extension 5/5     Palpation   Palpation comment tenderness with palpation along Rt biceps head, anterior      Special Tests    Special Tests Rotator Cuff Impingement   Rotator Cuff Impingment tests Full Can test;Empty Can test;Belly Press;Lift- off test;Hawkins- Kennedy test     Hawkins-Kennedy test   Findings Positive   Side Right   Comments pain over Rt bicep     Lift-Off test   Findings Negative     Belly Press   Findings Negative     Empty Can test   Findings Negative     Full Can test   Findings Negative            Objective measurements completed on examination: See above findings.          [facility] PT Treatment/Exercise - 06/11/17 0001      Exercises   Exercises Shoulder     Shoulder Exercises: Seated   Other Seated Exercises scap squeezes x15 reps      Manual Therapy   Manual Therapy Soft tissue mobilization   Soft tissue mobilization Rt bicep                PT Education - 06/11/17 1115    Education provided Yes   Education Details eval findings/POC; importance of addressing scapular strength and decreasing strain on the anterior shoulder; dry needling/manual treatment implications/benefits    Person(s) Educated Patient   Methods Explanation;Verbal cues;Demonstration   Comprehension Verbalized understanding;Returned demonstration          PT Short Term Goals - 06/11/17 1203      PT SHORT TERM GOAL #1   Title Pt will demo consistency and independence with  her HEP to improve shoulder strength and postural awareness.   Time 3   Period Weeks   Status New   Target Date 07/02/17           PT Long Term Goals - 06/11/17 1209      PT LONG TERM GOAL #1   Title Pt will score atleast a 9 point improvement on the UEFI to reflect improvements in Rt upper extremity use.   Time 6   Period Weeks   Status New   Target Date 07/23/17     PT LONG TERM GOAL #2   Title Pt will demo improved RUE strength to 5/5 MMT which will allow her to complete daily activity with improved safety and efficiency.   Time 6   Period Weeks   Status New     PT LONG TERM GOAL #3   Title Pt will report atleast a 50% improvement in her ability to lift a bag of groceries abover her head, to decrease her need for assistance throughout the day.   Time 6   Period Weeks   Status New     PT LONG TERM GOAL #4   Title Pt will Pt will report atleast a 50% improvement in her symptoms from the start of PT to allow her to complete daily household chores without difficulty or increased pain.    Time 6   Period Weeks   Status New  Plan - 06/20/17 1206    Clinical Impression Statement Pt is a pleasant 71 y.o F referred to OPPT with acute onset of Rt anterior shoulder pain ~4 weeks ago, noted while trying to pull a sheet onto her bed. Pt demonstrates full active and passive shoulder ROM, with pain across the anterior shoulder and bicep during overhead activity. Pt has palpable muscle spasm along the Rt bicep in addition to tenderness at the biceps groove. She also demonstrates poor endurance and strength of her posterior shoulder and scupular stabilizers contributing to possible overuse of the Rt bicep. She would benefit from skilled PT to address her limitations and facilitate return to daily activity without significant pain and difficulty.    History and Personal Factors relevant to plan of care: relatively acute onset   Clinical Presentation Stable    Clinical Presentation due to: No change in symptom severity and frequency    Clinical Decision Making Low   Rehab Potential Good   PT Frequency 2x / week   PT Duration 6 weeks   PT Treatment/Interventions ADLs/Self Care Home Management;Cryotherapy;Moist Heat;Electrical Stimulation;Therapeutic exercise;Therapeutic activities;Functional mobility training;Neuromuscular re-education;Patient/family education;Passive range of motion;Manual techniques;Dry needling;Taping   PT Next Visit Plan provide pt with dry needling handout and consider dry needling in first couple of sessions if no improvements with manual techniques; progress posterior shoulder strengthening and scapular strength/control    PT Home Exercise Plan seated scap retraction x15 reps    Consulted and Agree with Plan of Care Patient      Patient will benefit from skilled therapeutic intervention in order to improve the following deficits and impairments:  Decreased endurance, Decreased strength, Improper body mechanics, Pain, Postural dysfunction, Increased muscle spasms, Hypomobility, Impaired UE functional use, Impaired flexibility  Visit Diagnosis: Pain in right upper arm  Muscle weakness (generalized)  Abnormal posture      G-Codes - 2017-06-20 1202    Functional Assessment Tool Used (Outpatient Only) FOTO: 41% limited    Functional Limitation Carrying, moving and handling objects   Carrying, Moving and Handling Objects Current Status (P1025) At least 20 percent but less than 40 percent impaired, limited or restricted   Carrying, Moving and Handling Objects Goal Status (E5277) At least 20 percent but less than 40 percent impaired, limited or restricted       Problem List Patient Active Problem List   Diagnosis Date Noted  . Osteoarthritis of right knee 01/29/2014    Class: Diagnosis of  . 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 Acquired absence of breast and nipple 04/13/2012  . Symptomatic mammary hypertrophy 04/13/2012  .  Breast cancer, stage 1 ([place]) 07/18/2011    4:09 PM,June 20, 2017 [name] PT, DPT [facility] [address] [place], [place], [address] Phone: [phone]   Fax:  [phone]  Name: [name] MRN: [id number] Date of Birth: [birth year]

## 2017-06-14 ENCOUNTER — Encounter: Payer: Medicare Other | Admitting: Physical Therapy

## 2017-06-15 ENCOUNTER — Ambulatory Visit (INDEPENDENT_AMBULATORY_CARE_PROVIDER_SITE_OTHER): Payer: Medicare Other

## 2017-06-15 ENCOUNTER — Encounter (INDEPENDENT_AMBULATORY_CARE_PROVIDER_SITE_OTHER): Payer: Self-pay | Admitting: Orthopaedic Surgery

## 2017-06-15 ENCOUNTER — Ambulatory Visit (INDEPENDENT_AMBULATORY_CARE_PROVIDER_SITE_OTHER): Payer: Medicare Other | Admitting: Orthopaedic Surgery

## 2017-06-15 DIAGNOSIS — M542 Cervicalgia: Secondary | ICD-10-CM | POA: Diagnosis not present

## 2017-06-15 MED ORDER — PREDNISONE 10 MG PO TABS: 10.0000 mg | ORAL_TABLET | Freq: Every day | ORAL | 0 refills | Status: DC

## 2017-06-15 NOTE — Progress Notes (Signed)
Office Visit Note   Patient: [name]           Date of Birth: 1946-02-21           MRN: [id number] Visit Date: 06/15/2017              Requested by: Maurice Small, MD [address] [place], [place] [address] PCP: Maurice Small, MD   Assessment & Plan: Visit Diagnoses:  1. Neck pain     Plan: X-ray results were reviewed with the patient with cervical spondylosis. She gets good relief with distraction will place her on a prednisone packing gave her a home cervical traction said that she can use. We will recheck her in 2 weeks. We discussed the ice/heat, topical therapy. Recheck 2 weeks.  Follow-Up Instructions: Return in about 2 weeks (around 06/29/2017).   Orders:  Orders Placed This Encounter  Procedures  . XR Cervical Spine 2 or 3 views   Meds ordered this encounter  Medications  . predniSONE (DELTASONE) 10 MG tablet    Sig: Take 1 tablet (10 mg total) by mouth daily with breakfast.    Dispense:  21 tablet    Refill:  0      Procedures: No procedures performed   Clinical Data: No additional findings.   Subjective: Chief Complaint  Patient presents with  . Neck Pain    acute onset since Sunday    HPI 71 year old female seen with acute onset of neck pain that started on Sunday when she is lifting her head up from a pillow. She denies any radicular symptoms to her hand no numbness or tingling. No past history of neck injury. She states the pain is severe almost makes her nauseated and states that the pain progresses at times to make her cry. She has pain when she rotates the left. She denies any fever or chills. No numbness and tingling in her legs.  Review of Systems positive history of breast cancer , history of PVCs, osteoarthritis right knee. Otherwise negative as pertains history of present illness. Positive for depression.   Objective: Vital Signs: There were no vitals taken for this visit.  Physical Exam  Constitutional: She is  oriented to person, place, and time. She appears well-developed.  HENT:  Head: Normocephalic.  Right Ear: External ear normal.  Left Ear: External ear normal.  Eyes: Pupils are equal, round, and reactive to light.  Neck: No tracheal deviation present. No thyromegaly present.  Cardiovascular: Normal rate.   Pulmonary/Chest: Effort normal.  Abdominal: Soft.  Neurological: She is alert and oriented to person, place, and time.  Skin: Skin is warm and dry.  Psychiatric: She has a normal mood and affect. Her behavior is normal.    Ortho Exam patient is brachioplexus tenderness on the left. Increased pain with compression she gets some relief with distraction. Upper extremity reflexes are 2+ and symmetrical in the lower extremity clonus normal heel toe gait. Biceps triceps is intact station and all dermatomes is normal. No atrophy is noted in the upper extremities. Pulses at the wrist are normal. No edema no rash. No supraclavicular lymphadenopathy.  Specialty Comments:  No specialty comments available.  Imaging: Xr Cervical Spine 2 Or 3 Views  Result Date: 06/15/2017 AP lateral x-ray cervical spine demonstrates cervical spondylosis C5-6 C6-7 with disc space narrowing anterior posterior spurring and facet arthropathy as well as uncovertebral degenerative changes. Impression: Loss of normal curvature with mid and lower cervical spondylosis. No acute fracture.  PMFS History: Patient Active Problem List   Diagnosis Date Noted  . Osteoarthritis of right knee 01/29/2014    Class: Diagnosis of  . 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 Acquired absence of breast and nipple 04/13/2012  . Symptomatic mammary hypertrophy 04/13/2012  . Breast cancer, stage 1 ([place]) 07/18/2011   Past Medical History:  Diagnosis Date  . Acne   . Acute UTI 01/23/2014   to start ABX 01/24/2014  . Allergy   . Arthritis    feet, knee and base of thumbs  . Asthma    controlled  . Breast cancer, stage 1 ([place]) 07/18/2011  .  Carpal tunnel syndrome    right  . Dehydration 10/21/2011  . Depression   . Fibromyalgia   . Hepatitis 1976   hepatitis B  . Hyperlipidemia   . Hypertension   . Multiple allergies   . Osteopenia   . PONV (postoperative nausea and vomiting)    has had to use scop patch    Family History  Problem Relation Age of Onset  . Breast cancer Paternal Aunt   . Breast cancer Paternal Grandmother     Past Surgical History:  Procedure Laterality Date  . ABDOMINAL HYSTERECTOMY  1981  . BREAST CAPSULECTOMY WITH IMPLANT EXCHANGE Right 03/02/2013   Procedure: BREAST CAPSULECTOMY WITH REPOSITIONING THE IMPLANT;  Surgeon: Theodoro Kos, DO;  Location: [place];  Service: Plastics;  Laterality: Right;  . BREAST SURGERY    . BUNIONECTOMY  1998   left foot  . ETHMOIDECTOMY  1991  . EYE SURGERY    . facial plastic surgery  1996  . JOINT REPLACEMENT Left 2005  . KNEE ARTHROPLASTY Right 01/29/2014   Procedure: COMPUTER ASSISTED TOTAL KNEE ARTHROPLASTY;  Surgeon: Marybelle Killings, MD;  Location: [facility];  Service: Orthopedics;  Laterality: Right;  Right Total Knee Arthroplasty  . KNEE ARTHROSCOPY  1996, 2001   left  . lap band surgery  2010  . LIPOSUCTION Bilateral 03/02/2013   Procedure: LIPOSUCTION;  Surgeon: Theodoro Kos, DO;  Location: [facility];  Service: Plastics;  Laterality: Bilateral;  . MASTECTOMY Right 2012   w/chemo  . MASTOPEXY  04/13/2012   Procedure: MASTOPEXY;  Surgeon: Theodoro Kos, DO;  Location: [facility];  Service: Plastics;  Laterality: Left;   left breast  mastopexy reduction for symmetry  . NASAL SINUS SURGERY  2001  . port-a-cath placement  06/26/11   tip in lower SVC per chest x-ray read by Dr. Ivar Drape  . PORT-A-CATH REMOVAL Left 03/02/2013   Procedure: REMOVAL PORT-A-CATH;  Surgeon: Rolm Bookbinder, MD;  Location: [facility];  Service: General;  Laterality: Left;  . right breast lumpectomy  1986  . right ear  surgery  1983   tumor removed (?)  . right mastectomy  06-26-11  . TONSILLECTOMY  1950  . TOTAL KNEE ARTHROPLASTY  2006   left  . TUBAL LIGATION  1978   Social History   Occupational History  . Not on file.   Social History Main Topics  . Smoking status: Never Smoker  . Smokeless tobacco: Never Used  . Alcohol use No  . Drug use: No  . Sexual activity: Not Currently

## 2017-06-16 ENCOUNTER — Encounter: Payer: Medicare Other | Admitting: Physical Therapy

## 2017-06-21 ENCOUNTER — Encounter: Payer: Medicare Other | Admitting: Physical Therapy

## 2017-06-23 ENCOUNTER — Ambulatory Visit: Payer: Medicare Other | Admitting: Physical Therapy

## 2017-06-23 DIAGNOSIS — R293 Abnormal posture: Secondary | ICD-10-CM | POA: Insufficient documentation

## 2017-06-23 DIAGNOSIS — M6281 Muscle weakness (generalized): Secondary | ICD-10-CM | POA: Insufficient documentation

## 2017-06-23 DIAGNOSIS — M79621 Pain in right upper arm: Secondary | ICD-10-CM | POA: Insufficient documentation

## 2017-06-23 NOTE — Therapy (Addendum)
[facility] [address] [place], [place], [address] Phone: [phone]   Fax:  [phone]  Physical Therapy Treatment/Discharge  Patient Details  Name: [name] MRN: [id number] Date of Birth: January 03, 1946 Referring Provider: Maurice Small, MD  Encounter Date: 06/23/2017      PT End of Session - 06/23/17 1246    PT Start Time 1231   PT Stop Time 1246   PT Time Calculation (min) 15 min      Past Medical History:  Diagnosis Date  . Acne   . Acute UTI 01/23/2014   to start ABX 01/24/2014  . Allergy   . Arthritis    feet, knee and base of thumbs  . Asthma    controlled  . Breast cancer, stage 1 ([name]) 07/18/2011  . Carpal tunnel syndrome    right  . Dehydration 10/21/2011  . Depression   . Fibromyalgia   . Hepatitis 1976   hepatitis B  . Hyperlipidemia   . Hypertension   . Multiple allergies   . Osteopenia   . PONV (postoperative nausea and vomiting)    has had to use scop patch    Past Surgical History:  Procedure Laterality Date  . ABDOMINAL HYSTERECTOMY  1981  . BREAST CAPSULECTOMY WITH IMPLANT EXCHANGE Right 03/02/2013   Procedure: BREAST CAPSULECTOMY WITH REPOSITIONING THE IMPLANT;  Surgeon: Theodoro Kos, DO;  Location: [place];  Service: Plastics;  Laterality: Right;  . BREAST SURGERY    . BUNIONECTOMY  1998   left foot  . ETHMOIDECTOMY  1991  . EYE SURGERY    . facial plastic surgery  1996  . JOINT REPLACEMENT Left 2005  . KNEE ARTHROPLASTY Right 01/29/2014   Procedure: COMPUTER ASSISTED TOTAL KNEE ARTHROPLASTY;  Surgeon: Marybelle Killings, MD;  Location: [place];  Service: Orthopedics;  Laterality: Right;  Right Total Knee Arthroplasty  . KNEE ARTHROSCOPY  1996, 2001   left  . lap band surgery  2010  . LIPOSUCTION Bilateral 03/02/2013   Procedure: LIPOSUCTION;  Surgeon: Theodoro Kos, DO;  Location: [place];  Service: Plastics;  Laterality: Bilateral;  .  MASTECTOMY Right 2012   w/chemo  . MASTOPEXY  04/13/2012   Procedure: MASTOPEXY;  Surgeon: Theodoro Kos, DO;  Location: [place];  Service: Plastics;  Laterality: Left;   left breast  mastopexy reduction for symmetry  . NASAL SINUS SURGERY  2001  . port-a-cath placement  06/26/11   tip in lower SVC per chest x-ray read by Dr. Ivar Drape  . PORT-A-CATH REMOVAL Left 03/02/2013   Procedure: REMOVAL PORT-A-CATH;  Surgeon: Rolm Bookbinder, MD;  Location: [place];  Service: General;  Laterality: Left;  . right breast lumpectomy  1986  . right ear surgery  1983   tumor removed (?)  . right mastectomy  06-26-11  . TONSILLECTOMY  1950  . TOTAL KNEE ARTHROPLASTY  2006   left  . TUBAL LIGATION  1978    There were no vitals filed for this visit.          [facility] PT Assessment - 06/23/17 0001      Observation/Other Assessments   Focus on Therapeutic Outcomes (FOTO)  39%  CJ all 3 categories                               PT Short Term Goals - 06/11/17 1203  PT SHORT TERM GOAL #1   Title Pt will demo consistency and independence with her HEP to improve shoulder strength and postural awareness.   Time 3   Period Weeks   Status New   Target Date 07/02/17           PT Long Term Goals - 06/11/17 1209      PT LONG TERM GOAL #1   Title Pt will score atleast a 9 point improvement on the UEFI to reflect improvements in Rt upper extremity use.   Time 6   Period Weeks   Status New   Target Date 07/23/17     PT LONG TERM GOAL #2   Title Pt will demo improved RUE strength to 5/5 MMT which will allow her to complete daily activity with improved safety and efficiency.   Time 6   Period Weeks   Status New     PT LONG TERM GOAL #3   Title Pt will report atleast a 50% improvement in her ability to lift a bag of groceries abover her head, to decrease her need for assistance throughout the day.   Time 6   Period Weeks    Status New     PT LONG TERM GOAL #4   Title Pt will Pt will report atleast a 50% improvement in her symptoms from the start of PT to allow her to complete daily household chores without difficulty or increased pain.    Time 6   Period Weeks   Status New               Plan - Jul 15, 2017 1256    Clinical Impression Statement Pt has recently seen new MD for sudden increase in Lt neck pain. This MD wrote a new referral for PT with new diagnosis. She was directed by MD to "leave arm alone" per pt report. FOTO was slightly better than on eval which pt attributes to prednisone.    PT Next Visit Plan DC Pt for arm. Pt will come for eval tomorrow for cervical.       Patient will benefit from skilled therapeutic intervention in order to improve the following deficits and impairments:     Visit Diagnosis: Pain in right upper arm  Muscle weakness (generalized)  Abnormal posture       G-Codes - 2017/07/15 1315    Functional Assessment Tool Used (Outpatient Only) FOTO: 39% limited   Functional Limitation Carrying, moving and handling objects   Carrying, Moving and Handling Objects Goal Status ([id number]) At least 20 percent but less than 40 percent impaired, limited or restricted   Carrying, Moving and Handling Objects Discharge Status [phone]) At least 20 percent but less than 40 percent impaired, limited or restricted      Problem List Patient Active Problem List   Diagnosis Date Noted  . Osteoarthritis of right knee 01/29/2014    Class: Diagnosis of  . 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 Acquired absence of breast and nipple 04/13/2012  . Symptomatic mammary hypertrophy 04/13/2012  . Breast cancer, stage 1 ([name]) 07/18/2011      PHYSICAL THERAPY DISCHARGE SUMMARY  Visits from Start of Care: 1  Current functional level related to goals / functional outcomes: See evaluation note for most recent functional level measurements.    Remaining deficits: No change since evaluation    Education / Equipment: Pt arrived requesting to d/c from PT for her shoulder so that she can be seen for her neck. Pt was educated on the  process of discharge from shoulder rehab and needing a new evaluation for her neck. She will be seen tomorrow for this evaluation.  Plan: Patient agrees to discharge.  Patient goals were not met. Patient is being discharged due to the physician's request.  ?????     1:19 PM,06/23/17 Elly Modena PT, DPT [facility] [address] [place], [place], [address] Phone: [phone]   Fax:  [phone]  Name: LENIYAH MARTELL MRN: [id number] Date of Birth: 1946/08/11

## 2017-06-24 ENCOUNTER — Ambulatory Visit: Payer: Medicare Other | Attending: Orthopaedic Surgery

## 2017-06-24 DIAGNOSIS — R252 Cramp and spasm: Secondary | ICD-10-CM | POA: Insufficient documentation

## 2017-06-24 DIAGNOSIS — M6281 Muscle weakness (generalized): Secondary | ICD-10-CM

## 2017-06-24 DIAGNOSIS — R293 Abnormal posture: Secondary | ICD-10-CM | POA: Insufficient documentation

## 2017-06-24 DIAGNOSIS — M542 Cervicalgia: Secondary | ICD-10-CM | POA: Insufficient documentation

## 2017-06-24 NOTE — Patient Instructions (Addendum)
PERFORM ALL EXERCISES GENTLY AND WITH GOOD POSTURE.    20 SECOND HOLD, 3 REPS TO EACH SIDE. 4-5 TIMES EACH DAY.   AROM: Neck Rotation   Turn head slowly to look over one shoulder, then the other.   AROM: Neck Flexion   Bend head forward.   AROM: Lateral Neck Flexion   Slowly tilt head toward one shoulder, then the other.  Scapular Retraction (Standing)    With arms at sides, pinch shoulder blades together.  Hold 5 seconds Repeat _10___ times per set. Do __1__ sets per session. Do __many__ sessions per day.  http://orth.exer.us/945   Copyright  VHI. All rights reserved.   Posture - Standing   Good posture is important. Avoid slouching and forward head thrust. Maintain curve in low back and align ears over shoulders, hips over ankles.  Pull your belly button in toward your back bone. Posture Tips DO: - stand tall and erect - keep chin tucked in - keep head and shoulders in alignment - check posture regularly in mirror or large window - pull head back against headrest in car seat;  Change your position often.  Sit with lumbar support. DON'T: - slouch or slump while watching TV or reading - sit, stand or lie in one position  for too long;  Sitting is especially hard on the spine so if you sit at a desk/use the computer, then stand up often! Copyright  VHI. All rights reserved.  Posture - Sitting  Sit upright, head facing forward. Try using a roll to support lower back. Keep shoulders relaxed, and avoid rounded back. Keep hips level with knees. Avoid crossing legs for long periods. Copyright  VHI. All rights reserved.  Chronic neck strain can develop because of poor posture and faulty work habits  Postural strain related to slumped sitting and forward head posture is a leading cause of headaches, neck and upper back pain  General strengthening and flexibility exercises are helpful in the treatment of neck pain.  Most importantly, you should learn to correct the posture  that may be contributing to chronic pain.   Change positions frequently  Change your work or home environment to improve posture and mechanics.   [facility] [address] [place], [place] [address] Phone # [phone] Fax [phone]

## 2017-06-24 NOTE — Therapy (Signed)
[facility] [address] [place], [place], [address] Phone: [phone]   Fax:  [phone]  Physical Therapy Evaluation  Patient Details  Name: [name] MRN: [id number] Date of Birth: January 11, 1946 Referring Provider: Rodell Perna, MD  Encounter Date: 06/24/2017      PT End of Session - 06/24/17 1240    Visit Number 1   Number of Visits 10   Date for PT Re-Evaluation 08/19/17   PT Start Time 3790   PT Stop Time 1236   PT Time Calculation (min) 51 min   Activity Tolerance Patient tolerated treatment well   Behavior During Therapy [facility] for tasks assessed/performed      Past Medical History:  Diagnosis Date  . Acne   . Acute UTI 01/23/2014   to start ABX 01/24/2014  . Allergy   . Arthritis    feet, knee and base of thumbs  . Asthma    controlled  . Breast cancer, stage 1 ([place]) 07/18/2011  . Carpal tunnel syndrome    right  . Dehydration 10/21/2011  . Depression   . Fibromyalgia   . Hepatitis 1976   hepatitis B  . Hyperlipidemia   . Hypertension   . Multiple allergies   . Osteopenia   . PONV (postoperative nausea and vomiting)    has had to use scop patch    Past Surgical History:  Procedure Laterality Date  . ABDOMINAL HYSTERECTOMY  1981  . BREAST CAPSULECTOMY WITH IMPLANT EXCHANGE Right 03/02/2013   Procedure: BREAST CAPSULECTOMY WITH REPOSITIONING THE IMPLANT;  Surgeon: Theodoro Kos, DO;  Location: [place];  Service: Plastics;  Laterality: Right;  . BREAST SURGERY    . BUNIONECTOMY  1998   left foot  . ETHMOIDECTOMY  1991  . EYE SURGERY    . facial plastic surgery  1996  . JOINT REPLACEMENT Left 2005  . KNEE ARTHROPLASTY Right 01/29/2014   Procedure: COMPUTER ASSISTED TOTAL KNEE ARTHROPLASTY;  Surgeon: Marybelle Killings, MD;  Location: [place];  Service: Orthopedics;  Laterality: Right;  Right Total Knee Arthroplasty  . KNEE ARTHROSCOPY  1996, 2001   left  . lap band surgery   2010  . LIPOSUCTION Bilateral 03/02/2013   Procedure: LIPOSUCTION;  Surgeon: Theodoro Kos, DO;  Location: [place];  Service: Plastics;  Laterality: Bilateral;  . MASTECTOMY Right 2012   w/chemo  . MASTOPEXY  04/13/2012   Procedure: MASTOPEXY;  Surgeon: Theodoro Kos, DO;  Location: [place];  Service: Plastics;  Laterality: Left;   left breast  mastopexy reduction for symmetry  . NASAL SINUS SURGERY  2001  . port-a-cath placement  06/26/11   tip in lower SVC per chest x-ray read by Dr. Ivar Drape  . PORT-A-CATH REMOVAL Left 03/02/2013   Procedure: REMOVAL PORT-A-CATH;  Surgeon: Rolm Bookbinder, MD;  Location: [place];  Service: General;  Laterality: Left;  . right breast lumpectomy  1986  . right ear surgery  1983   tumor removed (?)  . right mastectomy  06-26-11  . TONSILLECTOMY  1950  . TOTAL KNEE ARTHROPLASTY  2006   left  . TUBAL LIGATION  1978    There were no vitals filed for this visit.       Subjective Assessment - 06/24/17 1149    Subjective Pt presents to PT with complaints of neck pain that is chronic and had flare-up ~2 weeks ago after PT evaluation for shoulder pain.  Pertinent History Lt and Rt TKA several years ago, HTN, depression, osteopenia, fibromyalgia.  Rt shoulder: Rt long biceps tear-avoid resisted biceps per MD   Diagnostic tests x-ray: DDD, spurs and spondylosis of the cervical spine   Patient Stated Goals reduce neck pain,    Currently in Pain? Yes   Pain Score 3    Pain Location Neck   Pain Orientation Left   Pain Descriptors / Indicators Aching   Pain Type Chronic pain   Pain Onset 1 to 4 weeks ago   Pain Frequency Intermittent   Aggravating Factors  weather changes, turning head, sleeping wrong   Pain Relieving Factors medication, hot shower            [facility] PT Assessment - 06/24/17 0001      Assessment   Medical Diagnosis cervical spondylosis and acute neck pain   Referring  Provider Rodell Perna, MD     Precautions   Precautions Other (comment)  history of breast cancer     Restrictions   Weight Bearing Restrictions No     Balance Screen   Has the patient fallen in the past 6 months No   Has the patient had a decrease in activity level because of a fear of falling?  No   Is the patient reluctant to leave their home because of a fear of falling?  No     Home Ecologist residence     Prior Function   Level of Independence Independent   Vocation Retired     Associate Professor   Overall Cognitive Status Within Functional Limits for tasks assessed     Observation/Other Assessments   Focus on Therapeutic Outcomes (FOTO)  38% limitation     Posture/Postural Control   Posture/Postural Control Postural limitations   Posture Comments forward head, rounded shoulders      AROM   Overall AROM  Deficits   Overall AROM Comments Lt shoulder AROM full and pain free, Rt shoulder not tested   Cervical Flexion 70   Cervical Extension WNL   Cervical - Right Side Bend 30   Cervical - Left Side Bend 35   Cervical - Right Rotation 50   Cervical - Left Rotation 20     Strength   Overall Strength Deficits  from last visit.  Not tested today as this increased pain    Right Shoulder Flexion 4+/5   Right Shoulder ABduction 4+/5   Right Shoulder Internal Rotation 5/5   Right Shoulder External Rotation 4/5   Left Shoulder Flexion 5/5   Left Shoulder ABduction 5/5   Left Shoulder Internal Rotation 5/5   Left Shoulder External Rotation 5/5     Palpation   Spinal mobility reduced PA mobility with pain C3-6   Palpation comment pt with active trigger points in bil upper traps and cervical paraspinals.       Ambulation/Gait   Gait Pattern Within Functional Limits            Objective measurements completed on examination: See above findings.                  PT Education - 06/24/17 1228    Education provided Yes    Education Details cervical A/ROM, scapular squeezes, posture   Person(s) Educated Patient   Methods Explanation;Demonstration;Handout   Comprehension Verbalized understanding;Returned demonstration          PT Short Term Goals - 06/24/17 1248      PT SHORT TERM  GOAL #1   Title be independent in initial HEP   Time 4   Period Weeks   Status New   Target Date 07/22/17     PT SHORT TERM GOAL #2   Title demonstrate Lt cervical rotation to > or = to 35 degrees to improve safety with driving   Time 4   Period Weeks   Status New   Target Date 07/22/17     PT SHORT TERM GOAL #3   Title report a 30% reduction in neck pain with ADLs and self-care   Time 4   Period Weeks   Status New           PT Long Term Goals - 06/24/17 1250      PT LONG TERM GOAL #1   Title be independent in advanced HEP   Time 8   Period Weeks   Status New   Target Date 08/19/17     PT LONG TERM GOAL #2   Title reduce FOTO to < or = to 30% limitation   Time 8   Period Weeks   Status New   Target Date 08/19/17     PT LONG TERM GOAL #3   Title demonstrate > or = to 45 degrees cervical sidebending to improve mobility with ADLs   Time 8   Period Weeks   Status New   Target Date 08/19/17     PT LONG TERM GOAL #4   Title report a 70% reduction in neck pain with ADLs and self-care   Time 8   Period Weeks   Status New   Target Date 08/19/17     PT LONG TERM GOAL #5   Title demonstrate cervical A/ROM rotation to > or = to 45 degrees to improve safety with driving   Time 8   Period Weeks   Target Date 08/19/17                Plan - 06/24/17 1241    Clinical Impression Statement Pt presents to PT with complaints of chronic neck pain that has been worse after the past 2 weeks after PT evaluation for shoulder pain.  Pt had recent x-ray that showed cervical DDD, bone spurs and spondylosis.  Pt demonstrates limited cervical A/ROM and pain with end range motion.  Pt with palpable tenderness  over bil upper traps and cervical paraspinals with trigger points, and reduced PA mobility and pain with mobs in the cervical spine.  Pt with poor seated posture.  Pt will benefit from skilled PT for cervical flexibility, postural strength, traction, dry needling and pain management.     History and Personal Factors relevant to plan of care: fibromyalgia, biceps tear   Clinical Presentation Evolving   Clinical Presentation due to: increase in symptoms over 2 weeks   Clinical Decision Making Moderate   Rehab Potential Good   PT Frequency 2x / week   PT Duration 8 weeks   PT Treatment/Interventions ADLs/Self Care Home Management;Cryotherapy;Moist Heat;Electrical Stimulation;Therapeutic exercise;Therapeutic activities;Functional mobility training;Neuromuscular re-education;Patient/family education;Passive range of motion;Manual techniques;Dry needling;Taping   PT Next Visit Plan cervical traction, dry needling to cervical spine and upper traps, postural strength   Consulted and Agree with Plan of Care Patient      Patient will benefit from skilled therapeutic intervention in order to improve the following deficits and impairments:  Decreased endurance, Decreased strength, Improper body mechanics, Pain, Postural dysfunction, Increased muscle spasms, Hypomobility, Impaired UE functional use, Impaired flexibility, Decreased range of motion  Visit Diagnosis: Abnormal posture - Plan: PT plan of care cert/re-cert  Muscle weakness (generalized) - Plan: PT plan of care cert/re-cert  Cervicalgia - Plan: PT plan of care cert/re-cert  Cramp and spasm - Plan: PT plan of care cert/re-cert      G-Codes - 71/21/97 01-07-1205    Functional Assessment Tool Used (Outpatient Only) FOTO: 38% limitation   Functional Limitation Changing and maintaining body position   Changing and Maintaining Body Position Current Status (J8832) At least 20 percent but less than 40 percent impaired, limited or restricted   Changing  and Maintaining Body Position Goal Status (P4982) At least 20 percent but less than 40 percent impaired, limited or restricted       Problem List Patient Active Problem List   Diagnosis Date Noted  . Osteoarthritis of right knee 01/29/2014    Class: Diagnosis of  . 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 Acquired absence of breast and nipple 04/13/2012  . Symptomatic mammary hypertrophy 04/13/2012  . Breast cancer, stage 1 ([place]) 07/18/2011     Sigurd Sos, PT 06/24/17 12:56 PM  [facility] [address], [place], [place], [address] Phone: [phone]   Fax:  [phone]  Name: [name] MRN: [id number] Date of Birth: 1946/05/23

## 2017-06-25 ENCOUNTER — Ambulatory Visit: Payer: Self-pay | Admitting: Podiatry

## 2017-06-28 ENCOUNTER — Ambulatory Visit: Payer: Medicare Other | Admitting: Physical Therapy

## 2017-06-30 ENCOUNTER — Encounter: Payer: Medicare Other | Admitting: Physical Therapy

## 2017-07-02 ENCOUNTER — Encounter (INDEPENDENT_AMBULATORY_CARE_PROVIDER_SITE_OTHER): Payer: Self-pay | Admitting: Orthopaedic Surgery

## 2017-07-02 ENCOUNTER — Ambulatory Visit (INDEPENDENT_AMBULATORY_CARE_PROVIDER_SITE_OTHER): Payer: Medicare Other | Admitting: Orthopaedic Surgery

## 2017-07-02 VITALS — BP 134/74 | HR 73

## 2017-07-02 DIAGNOSIS — M47812 Spondylosis without myelopathy or radiculopathy, cervical region: Secondary | ICD-10-CM

## 2017-07-02 NOTE — Progress Notes (Signed)
Office Visit Note   Patient: [name]           Date of Birth: 10/24/1945           MRN: [id number] Visit Date: 07/02/2017              Requested by: Maurice Small, MD [address], [place] [address] PCP: Maurice Small, MD   Assessment & Plan: Visit Diagnoses:  1. Spondylosis without myelopathy or radiculopathy, cervical region            Improving with  Home cervical traction.  Plan: She can use the traction with her back to the dura pushing her chair up against the door sitting. Continue Ultram at night. She's gotten some easing in her symptoms. If she develops radicular symptoms she will call we can consider cervical MRI imaging.  Follow-Up Instructions: Return if symptoms worsen or fail to improve.   Orders:  No orders of the defined types were placed in this encounter.  No orders of the defined types were placed in this encounter.     Procedures: No procedures performed   Clinical Data: No additional findings.   Subjective: Chief Complaint  Patient presents with  . Neck - Pain, Follow-up    HPI 71 year old female returns for follow-up of cervical spondylosis without radiculopathy. She is using the home traction which helps but she states she has to stand up since her knees bump against the door when she faces the door. She purchased a collar which she's been wearing which seems to help. She is using Ultram at night. She denies any lower extremity weakness. No chills or fever no numbness or tingling in her arms. Onset was 3 weeks ago when she is lifting her head up from the pillow.  Review of Systems review of systems updated unchanged from 06/15/2017.   Objective: Vital Signs: BP 134/74   Pulse 73   Physical Exam  Constitutional: She is oriented to person, place, and time. She appears well-developed.  HENT:  Head: Normocephalic.  Right Ear: External ear normal.  Left Ear: External ear normal.  Eyes: Pupils are equal, round,  and reactive to light.  Neck: No tracheal deviation present. No thyromegaly present.  Cardiovascular: Normal rate.   Pulmonary/Chest: Effort normal.  Abdominal: Soft.  Neurological: She is alert and oriented to person, place, and time.  Skin: Skin is warm and dry.  Psychiatric: She has a normal mood and affect. Her behavior is normal.    Ortho Exam patient has some brachioplexus tenderness. She gets relief with distraction some increased compression. Upper lower extremity reflexes are intact isolated motor weakness. She has discomfort with cervical rotation and tilting.  Specialty Comments:  No specialty comments available.  Imaging: Previous x-ray showed cervical spondylosis C5-6 and C6-7 with disc space narrowing and spurring.   PMFS History: Patient Active Problem List   Diagnosis Date Noted  . Osteoarthritis of right knee 01/29/2014    Class: Diagnosis of  . 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 Acquired absence of breast and nipple 04/13/2012  . Symptomatic mammary hypertrophy 04/13/2012  . Breast cancer, stage 1 ([place]) 07/18/2011   Past Medical History:  Diagnosis Date  . Acne   . Acute UTI 01/23/2014   to start ABX 01/24/2014  . Allergy   . Arthritis    feet, knee and base of thumbs  . Asthma    controlled  . Breast cancer, stage 1 ([place]) 07/18/2011  . Carpal tunnel  syndrome    right  . Dehydration 10/21/2011  . Depression   . Fibromyalgia   . Hepatitis 1976   hepatitis B  . Hyperlipidemia   . Hypertension   . Multiple allergies   . Osteopenia   . PONV (postoperative nausea and vomiting)    has had to use scop patch    Family History  Problem Relation Age of Onset  . Breast cancer Paternal Aunt   . Breast cancer Paternal Grandmother     Past Surgical History:  Procedure Laterality Date  . ABDOMINAL HYSTERECTOMY  1981  . BREAST CAPSULECTOMY WITH IMPLANT EXCHANGE Right 03/02/2013   Procedure: BREAST CAPSULECTOMY WITH REPOSITIONING THE IMPLANT;  Surgeon: Theodoro Kos, DO;  Location: [facility];  Service: Plastics;  Laterality: Right;  . BREAST SURGERY    . BUNIONECTOMY  1998   left foot  . ETHMOIDECTOMY  1991  . EYE SURGERY    . facial plastic surgery  1996  . JOINT REPLACEMENT Left 2005  . KNEE ARTHROPLASTY Right 01/29/2014   Procedure: COMPUTER ASSISTED TOTAL KNEE ARTHROPLASTY;  Surgeon: Marybelle Killings, MD;  Location: [facility] [facility];  Service: Orthopedics;  Laterality: Right;  Right Total Knee Arthroplasty  . KNEE ARTHROSCOPY  1996, 2001   left  . lap band surgery  2010  . LIPOSUCTION Bilateral 03/02/2013   Procedure: LIPOSUCTION;  Surgeon: Theodoro Kos, DO;  Location: [place];  Service: Plastics;  Laterality: Bilateral;  . MASTECTOMY Right 2012   w/chemo  . MASTOPEXY  04/13/2012   Procedure: MASTOPEXY;  Surgeon: Theodoro Kos, DO;  Location: [facility];  Service: Plastics;  Laterality: Left;   left breast  mastopexy reduction for symmetry  . NASAL SINUS SURGERY  2001  . port-a-cath placement  06/26/11   tip in lower SVC per chest x-ray read by Dr. Ivar Drape  . PORT-A-CATH REMOVAL Left 03/02/2013   Procedure: REMOVAL PORT-A-CATH;  Surgeon: Rolm Bookbinder, MD;  Location: [facility];  Service: General;  Laterality: Left;  . right breast lumpectomy  1986  . right ear surgery  1983   tumor removed (?)  . right mastectomy  06-26-11  . TONSILLECTOMY  1950  . TOTAL KNEE ARTHROPLASTY  2006   left  . TUBAL LIGATION  1978   Social History   Occupational History  . Not on file.   Social History Main Topics  . Smoking status: Never Smoker  . Smokeless tobacco: Never Used  . Alcohol use No  . Drug use: No  . Sexual activity: Not Currently

## 2017-07-04 ENCOUNTER — Other Ambulatory Visit (INDEPENDENT_AMBULATORY_CARE_PROVIDER_SITE_OTHER): Payer: Self-pay | Admitting: Orthopaedic Surgery

## 2017-07-05 ENCOUNTER — Encounter: Payer: Self-pay | Admitting: Physical Therapy

## 2017-07-05 ENCOUNTER — Ambulatory Visit: Payer: Medicare Other | Admitting: Physical Therapy

## 2017-07-05 DIAGNOSIS — R293 Abnormal posture: Secondary | ICD-10-CM | POA: Diagnosis not present

## 2017-07-05 DIAGNOSIS — M542 Cervicalgia: Secondary | ICD-10-CM

## 2017-07-05 DIAGNOSIS — R252 Cramp and spasm: Secondary | ICD-10-CM

## 2017-07-05 DIAGNOSIS — M6281 Muscle weakness (generalized): Secondary | ICD-10-CM
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
## 2017-07-05 NOTE — Telephone Encounter (Signed)
OK refill thanks 

## 2017-07-05 NOTE — Telephone Encounter (Signed)
Ok for refill? 

## 2017-07-07 ENCOUNTER — Encounter: Payer: Self-pay | Admitting: Physical Therapy

## 2017-07-07 ENCOUNTER — Ambulatory Visit: Payer: Medicare Other | Attending: Family Medicine | Admitting: Physical Therapy

## 2017-07-07 DIAGNOSIS — R252 Cramp and spasm: Secondary | ICD-10-CM

## 2017-07-07 DIAGNOSIS — M542 Cervicalgia: Secondary | ICD-10-CM

## 2017-07-07 DIAGNOSIS — M6281 Muscle weakness (generalized): Secondary | ICD-10-CM

## 2017-07-07 DIAGNOSIS — R293 Abnormal posture: Secondary | ICD-10-CM | POA: Diagnosis present

## 2017-07-07 DIAGNOSIS — M79621 Pain in right upper arm: Secondary | ICD-10-CM | POA: Diagnosis present

## 2017-07-07 NOTE — Therapy (Signed)
[facility] [address] [place], [place], [address] Phone: [phone]   Fax:  [phone]  Physical Therapy Treatment  Patient Details  Name: [name] MRN: [id number] Date of Birth: 06/21/1946 Referring Provider: Rodell Perna, MD  Encounter Date: 07/07/2017      PT End of Session - 07/07/17 1255    Visit Number 3   Number of Visits 10   Date for PT Re-Evaluation 08/19/17   Authorization Type UHC medicare    Authorization - Visit Number 3   PT Start Time 2952   PT Stop Time 1315   PT Time Calculation (min) 55 min   Activity Tolerance Patient tolerated treatment well   Behavior During Therapy [facility] for tasks assessed/performed      Past Medical History:  Diagnosis Date  . Acne   . Acute UTI 01/23/2014   to start ABX 01/24/2014  . Allergy   . Arthritis    feet, knee and base of thumbs  . Asthma    controlled  . Breast cancer, stage 1 ([place]) 07/18/2011  . Carpal tunnel syndrome    right  . Dehydration 10/21/2011  . Depression   . Fibromyalgia   . Hepatitis 1976   hepatitis B  . Hyperlipidemia   . Hypertension   . Multiple allergies   . Osteopenia   . PONV (postoperative nausea and vomiting)    has had to use scop patch    Past Surgical History:  Procedure Laterality Date  . ABDOMINAL HYSTERECTOMY  1981  . BREAST CAPSULECTOMY WITH IMPLANT EXCHANGE Right 03/02/2013   Procedure: BREAST CAPSULECTOMY WITH REPOSITIONING THE IMPLANT;  Surgeon: Theodoro Kos, DO;  Location: [place];  Service: Plastics;  Laterality: Right;  . BREAST SURGERY    . BUNIONECTOMY  1998   left foot  . ETHMOIDECTOMY  1991  . EYE SURGERY    . facial plastic surgery  1996  . JOINT REPLACEMENT Left 2005  . KNEE ARTHROPLASTY Right 01/29/2014   Procedure: COMPUTER ASSISTED TOTAL KNEE ARTHROPLASTY;  Surgeon: Marybelle Killings, MD;  Location: [place];  Service: Orthopedics;  Laterality: Right;  Right Total Knee  Arthroplasty  . KNEE ARTHROSCOPY  1996, 2001   left  . lap band surgery  2010  . LIPOSUCTION Bilateral 03/02/2013   Procedure: LIPOSUCTION;  Surgeon: Theodoro Kos, DO;  Location: [place];  Service: Plastics;  Laterality: Bilateral;  . MASTECTOMY Right 2012   w/chemo  . MASTOPEXY  04/13/2012   Procedure: MASTOPEXY;  Surgeon: Theodoro Kos, DO;  Location: [place];  Service: Plastics;  Laterality: Left;   left breast  mastopexy reduction for symmetry  . NASAL SINUS SURGERY  2001  . port-a-cath placement  06/26/11   tip in lower SVC per chest x-ray read by Dr. Ivar Drape  . PORT-A-CATH REMOVAL Left 03/02/2013   Procedure: REMOVAL PORT-A-CATH;  Surgeon: Rolm Bookbinder, MD;  Location: [place];  Service: General;  Laterality: Left;  . right breast lumpectomy  1986  . right ear surgery  1983   tumor removed (?)  . right mastectomy  06-26-11  . TONSILLECTOMY  1950  . TOTAL KNEE ARTHROPLASTY  2006   left  . TUBAL LIGATION  1978    There were no vitals filed for this visit.      Subjective Assessment - 07/07/17 1259    Subjective I felt much better after doing the traction. No pain today.  Currently in Pain? No/denies   Multiple Pain Sites No                         [facility] Adult PT Treatment/Exercise - 07/07/17 0001      Moist Heat Therapy   Number Minutes Moist Heat 10 Minutes  Pt came early for 10 min of cervical heat.     Traction   Type of Traction Cervical   Min (lbs) 5   Max (lbs) 15   Hold Time 10   Rest Time 60   Time 15     Manual Therapy   Manual Therapy Soft tissue mobilization   Soft tissue mobilization Lt scalenes, Lt upper trap, SCM, gentle traction                  PT Short Term Goals - 06/24/17 1248      PT SHORT TERM GOAL #1   Title be independent in initial HEP   Time 4   Period Weeks   Status New   Target Date 07/22/17     PT SHORT TERM GOAL #2   Title demonstrate  Lt cervical rotation to > or = to 35 degrees to improve safety with driving   Time 4   Period Weeks   Status New   Target Date 07/22/17     PT SHORT TERM GOAL #3   Title report a 30% reduction in neck pain with ADLs and self-care   Time 4   Period Weeks   Status New           PT Long Term Goals - 06/24/17 1250      PT LONG TERM GOAL #1   Title be independent in advanced HEP   Time 8   Period Weeks   Status New   Target Date 08/19/17     PT LONG TERM GOAL #2   Title reduce FOTO to < or = to 30% limitation   Time 8   Period Weeks   Status New   Target Date 08/19/17     PT LONG TERM GOAL #3   Title demonstrate > or = to 45 degrees cervical sidebending to improve mobility with ADLs   Time 8   Period Weeks   Status New   Target Date 08/19/17     PT LONG TERM GOAL #4   Title report a 70% reduction in neck pain with ADLs and self-care   Time 8   Period Weeks   Status New   Target Date 08/19/17     PT LONG TERM GOAL #5   Title demonstrate cervical A/ROM rotation to > or = to 45 degrees to improve safety with driving   Time 8   Period Weeks   Target Date 08/19/17               Plan - 07/07/17 1256    Clinical Impression Statement Pt reports feeling much better after her first session since returning to PT. She is now able to hold her head more neutral vs tilited LT, she had no pain, and the soft tissues along the LT side of her neck were much more supple. We increassed the traction to pull slightly more.    Rehab Potential Good   PT Frequency 2x / week   PT Duration 8 weeks   PT Treatment/Interventions ADLs/Self Care Home Management;Cryotherapy;Moist Heat;Electrical Stimulation;Therapeutic exercise;Therapeutic activities;Functional mobility training;Neuromuscular re-education;Patient/family education;Passive range of motion;Manual techniques;Dry needling;Taping  PT Next Visit Plan Add postural strength in supine to HEP: band vs no resistance, continue with  traction. Pt may come early for heat to neck.    Consulted and Agree with Plan of Care Patient      Patient will benefit from skilled therapeutic intervention in order to improve the following deficits and impairments:  Decreased endurance, Decreased strength, Improper body mechanics, Pain, Postural dysfunction, Increased muscle spasms, Hypomobility, Impaired UE functional use, Impaired flexibility, Decreased range of motion  Visit Diagnosis: Abnormal posture  Muscle weakness (generalized)  Cervicalgia  Cramp and spasm     Problem List Patient Active Problem List   Diagnosis Date Noted  . Osteoarthritis of right knee 01/29/2014    Class: Diagnosis of  . 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 Acquired absence of breast and nipple 04/13/2012  . Symptomatic mammary hypertrophy 04/13/2012  . Breast cancer, stage 1 ([place]) 07/18/2011    Bionca Mckey, PTA 07/07/2017, 1:02 PM  [facility] [address], [place], [place], [address] Phone: [phone]   Fax:  [phone]  Name: NIKKOLE PLACZEK MRN: [id number] Date of Birth: March 18, 1946

## 2017-07-08 ENCOUNTER — Other Ambulatory Visit: Payer: Self-pay | Admitting: Podiatry

## 2017-07-08 ENCOUNTER — Encounter: Payer: Self-pay | Admitting: Podiatry

## 2017-07-08 ENCOUNTER — Ambulatory Visit (INDEPENDENT_AMBULATORY_CARE_PROVIDER_SITE_OTHER): Payer: Medicare Other

## 2017-07-08 ENCOUNTER — Ambulatory Visit (INDEPENDENT_AMBULATORY_CARE_PROVIDER_SITE_OTHER): Payer: Medicare Other | Admitting: Podiatry

## 2017-07-08 VITALS — Resp 16

## 2017-07-08 DIAGNOSIS — M79672 Pain in left foot: Secondary | ICD-10-CM | POA: Diagnosis not present

## 2017-07-08 DIAGNOSIS — M76822 Posterior tibial tendinitis, left leg: Secondary | ICD-10-CM | POA: Diagnosis not present

## 2017-07-08 DIAGNOSIS — L600 Ingrowing nail: Secondary | ICD-10-CM

## 2017-07-08 NOTE — Patient Instructions (Signed)

## 2017-07-08 NOTE — Progress Notes (Signed)
Subjective:    Patient ID: [name], female   DOB: 71 y.o.   MRN: [id number]   HPI patient presents with ingrown toenail left big toe that's been very tender and complete collapse medial longitudinal arch left with history of posterior tibial tendinitis and also lateral foot pain secondary to compensation. Feels very unstable and feels like she has no support and also has deformity right but not to the same degree    Review of Systems  All other systems reviewed and are negative.       Objective:  Physical Exam  Constitutional: She appears well-developed and well-nourished.  Cardiovascular: Intact distal pulses.   Pulmonary/Chest: Effort normal.  Musculoskeletal: Normal range of motion.  Neurological: She is alert.  Skin: Skin is warm.  Nursing note and vitals reviewed.  neurovascular status intact muscle strength was adequate with complete collapse medial longitudinal arch left with posterior tibial tendon dysfunction. Patient does walk with a abducted gait pattern and does still extremely unstable and is worried if she ever falls due to the instability. The right is also collapse but not to the same degree and there is an incurvated left hallux medial border that's painful when pressed. 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 Due to the severe deformity and her instability AFO bracing is been recommended and she will see [facility] orthotist for evaluation and treatment of this condition. I went ahead and discussed correction of the ingrown toenail and patient wants this done and I explained procedure and risk and she signed consent form. I infiltrated the left hallux 60 mg like Marcaine mixture removed the medial border exposed  matrix and applied phenol 3 applications 30 seconds followed by alcohol lavaged sterile dressing. Gave instructions on soaks and reappoint

## 2017-07-08 NOTE — Progress Notes (Signed)
   Subjective:    Patient ID: [name], female    DOB: 28-Nov-1945, 71 y.o.   MRN: [id number]  HPI    Review of Systems  All other systems reviewed and are negative.      Objective:   Physical Exam        Assessment & Plan:

## 2017-07-12 ENCOUNTER — Encounter: Payer: Medicare Other | Admitting: Physical Therapy

## 2017-07-14 ENCOUNTER — Encounter: Payer: Self-pay | Admitting: Physical Therapy

## 2017-07-14 ENCOUNTER — Ambulatory Visit: Payer: Medicare Other | Admitting: Physical Therapy

## 2017-07-14 DIAGNOSIS — M79621 Pain in right upper arm: Secondary | ICD-10-CM | POA: Diagnosis not present

## 2017-07-14 DIAGNOSIS — R252 Cramp and spasm: Secondary | ICD-10-CM

## 2017-07-14 DIAGNOSIS — R293 Abnormal posture: Secondary | ICD-10-CM

## 2017-07-14 DIAGNOSIS — M542 Cervicalgia: Secondary | ICD-10-CM

## 2017-07-14 DIAGNOSIS — M6281 Muscle weakness (generalized): Secondary | ICD-10-CM

## 2017-07-14 NOTE — Therapy (Signed)
[facility] [address] [name], [place], [address] Phone: [phone]   Fax:  [phone]  Physical Therapy Treatment  Patient Details  Name: [name] MRN: [id number] Date of Birth: October 11, 1945 Referring Provider: Rodell Perna, MD  Encounter Date: 07/14/2017      PT End of Session - 07/14/17 1237    Visit Number 4   Number of Visits 10   Date for PT Re-Evaluation 08/19/17   Authorization Type UHC medicare    Authorization Time Period 06/11/17 to 07/23/17   Authorization - Visit Number 4   Authorization - Number of Visits 13   PT Start Time 1231  Low level pt   PT Stop Time 1305   PT Time Calculation (min) 34 min   Activity Tolerance Patient tolerated treatment well   Behavior During Therapy [facility] for tasks assessed/performed      Past Medical History:  Diagnosis Date  . Acne   . Acute UTI 01/23/2014   to start ABX 01/24/2014  . Allergy   . Arthritis    feet, knee and base of thumbs  . Asthma    controlled  . Breast cancer, stage 1 ([place]) 07/18/2011  . Carpal tunnel syndrome    right  . Dehydration 10/21/2011  . Depression   . Fibromyalgia   . Hepatitis 1976   hepatitis B  . Hyperlipidemia   . Hypertension   . Mononucleosis 1952  . Multiple allergies   . Nephritis 1955  . Osteopenia   . PONV (postoperative nausea and vomiting)    has had to use scop patch    Past Surgical History:  Procedure Laterality Date  . ABDOMINAL HYSTERECTOMY  1981  . BREAST CAPSULECTOMY WITH IMPLANT EXCHANGE Right 03/02/2013   Procedure: BREAST CAPSULECTOMY WITH REPOSITIONING THE IMPLANT;  Surgeon: Theodoro Kos, DO;  Location: [place];  Service: Plastics;  Laterality: Right;  . BREAST SURGERY    . BUNIONECTOMY  1998   left foot  . ETHMOIDECTOMY  1991  . EYE SURGERY    . facial plastic surgery  1996  . JOINT REPLACEMENT Left 2005  . KNEE ARTHROPLASTY Right 01/29/2014   Procedure: COMPUTER  ASSISTED TOTAL KNEE ARTHROPLASTY;  Surgeon: Marybelle Killings, MD;  Location: [place];  Service: Orthopedics;  Laterality: Right;  Right Total Knee Arthroplasty  . KNEE ARTHROSCOPY  1996, 2001   left  . lap band surgery  2010  . LIPOSUCTION Bilateral 03/02/2013   Procedure: LIPOSUCTION;  Surgeon: Theodoro Kos, DO;  Location: [place];  Service: Plastics;  Laterality: Bilateral;  . MASTECTOMY Right 2012   w/chemo  . MASTOPEXY  04/13/2012   Procedure: MASTOPEXY;  Surgeon: Theodoro Kos, DO;  Location: [place];  Service: Plastics;  Laterality: Left;   left breast  mastopexy reduction for symmetry  . NASAL SINUS SURGERY  2001  . port-a-cath placement  06/26/11   tip in lower SVC per chest x-ray read by Dr. Ivar Drape  . PORT-A-CATH REMOVAL Left 03/02/2013   Procedure: REMOVAL PORT-A-CATH;  Surgeon: Rolm Bookbinder, MD;  Location: [place];  Service: General;  Laterality: Left;  . right breast lumpectomy  1986  . right ear surgery  1983   tumor removed (?)  . right mastectomy  06-26-11  . TONSILLECTOMY  1950  . TOTAL KNEE ARTHROPLASTY  2006   left  . TUBAL LIGATION  1978    There were no vitals filed for  this visit.      Subjective Assessment - 07/14/17 1239    Subjective I am 85%-90% improved.   Currently in Pain? No/denies   Multiple Pain Sites No                         [facility] Adult PT Treatment/Exercise - 07/14/17 0001      Traction   Type of Traction Cervical   Min (lbs) 5   Max (lbs) 16   Hold Time 10   Rest Time 60   Time 15                PT Education - 07/14/17 1240    Education provided Yes   Education Details Scapular band unattached for HEP, yellow   Person(s) Educated Patient   Methods Explanation;Demonstration;Tactile cues;Verbal cues;Handout   Comprehension Verbalized understanding;Returned demonstration          PT Short Term Goals - 06/24/17 1248      PT SHORT TERM GOAL #1    Title be independent in initial HEP   Time 4   Period Weeks   Status New   Target Date 07/22/17     PT SHORT TERM GOAL #2   Title demonstrate Lt cervical rotation to > or = to 35 degrees to improve safety with driving   Time 4   Period Weeks   Status New   Target Date 07/22/17     PT SHORT TERM GOAL #3   Title report a 30% reduction in neck pain with ADLs and self-care   Time 4   Period Weeks   Status New           PT Long Term Goals - 06/24/17 1250      PT LONG TERM GOAL #1   Title be independent in advanced HEP   Time 8   Period Weeks   Status New   Target Date 08/19/17     PT LONG TERM GOAL #2   Title reduce FOTO to < or = to 30% limitation   Time 8   Period Weeks   Status New   Target Date 08/19/17     PT LONG TERM GOAL #3   Title demonstrate > or = to 45 degrees cervical sidebending to improve mobility with ADLs   Time 8   Period Weeks   Status New   Target Date 08/19/17     PT LONG TERM GOAL #4   Title report a 70% reduction in neck pain with ADLs and self-care   Time 8   Period Weeks   Status New   Target Date 08/19/17     PT LONG TERM GOAL #5   Title demonstrate cervical A/ROM rotation to > or = to 45 degrees to improve safety with driving   Time 8   Period Weeks   Target Date 08/19/17               Plan - 07/14/17 1237    Clinical Impression Statement Pt reports her pain is 80%-90% improved at this point. We added some postural strengthening exercises for HEP today that included core contraction.    Rehab Potential Good   PT Frequency 2x / week   PT Duration 8 weeks   PT Treatment/Interventions ADLs/Self Care Home Management;Cryotherapy;Moist Heat;Electrical Stimulation;Therapeutic exercise;Therapeutic activities;Functional mobility training;Neuromuscular re-education;Patient/family education;Passive range of motion;Manual techniques;Dry needling;Taping   PT Next Visit Plan Review HEP given today, mechanical traction.   Consulted  and Agree with Plan of Care --      Patient will benefit from skilled therapeutic intervention in order to improve the following deficits and impairments:  Decreased endurance, Decreased strength, Improper body mechanics, Pain, Postural dysfunction, Increased muscle spasms, Hypomobility, Impaired UE functional use, Impaired flexibility, Decreased range of motion  Visit Diagnosis: Abnormal posture  Muscle weakness (generalized)  Cervicalgia  Cramp and spasm  Pain in right upper arm     Problem List Patient Active Problem List   Diagnosis Date Noted  . Osteoarthritis of right knee 01/29/2014    Class: Diagnosis of  . 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 Acquired absence of breast and nipple 04/13/2012  . Symptomatic mammary hypertrophy 04/13/2012  . Breast cancer, stage 1 ([place]) 07/18/2011    Calandria Mullings, PTA 07/14/2017, 12:54 PM  [facility] [address] [place], [place], [address] Phone: [phone]   Fax:  [phone]  Name: ELEANORA GUINYARD MRN: [id number] Date of Birth: September 04, 1946

## 2017-07-15 ENCOUNTER — Other Ambulatory Visit: Payer: Medicare Other | Admitting: Orthotics

## 2017-07-19 ENCOUNTER — Encounter: Payer: Self-pay | Admitting: Physical Therapy

## 2017-07-19 ENCOUNTER — Ambulatory Visit: Payer: Medicare Other | Attending: Orthopaedic Surgery | Admitting: Physical Therapy

## 2017-07-19 ENCOUNTER — Telehealth: Payer: Self-pay | Admitting: Podiatry

## 2017-07-19 DIAGNOSIS — M79621 Pain in right upper arm: Secondary | ICD-10-CM | POA: Insufficient documentation

## 2017-07-19 DIAGNOSIS — M542 Cervicalgia: Secondary | ICD-10-CM | POA: Diagnosis present

## 2017-07-19 DIAGNOSIS — R293 Abnormal posture: Secondary | ICD-10-CM | POA: Insufficient documentation

## 2017-07-19 DIAGNOSIS — R252 Cramp and spasm: Secondary | ICD-10-CM | POA: Insufficient documentation

## 2017-07-19 DIAGNOSIS — M6281 Muscle weakness (generalized): Secondary | ICD-10-CM | POA: Diagnosis present

## 2017-07-19 NOTE — Telephone Encounter (Signed)
Per Voicemail from 11.2.18 pt wants to cancel brace order. She does not feel she will be able to wear it and that it would make her gait off and hurt her knee that she had surgery on previously. Can you please hold off on ordering this.

## 2017-07-19 NOTE — Therapy (Signed)
[facility] [address], [place], [place], [address] Phone: [phone]   Fax:  [phone]  Physical Therapy Treatment  Patient Details  Name: [name] MRN: [id number] Date of Birth: December 15, 1945 Referring Provider: Rodell Perna, MD   Encounter Date: 07/19/2017  PT End of Session - 07/19/17 1227    Visit Number  5    Number of Visits  10    Date for PT Re-Evaluation  08/19/17    Authorization Type  UHC medicare     Authorization Time Period  06/11/17 to 07/23/17    Authorization - Visit Number  5    Authorization - Number of Visits  13    PT Start Time  1227    PT Stop Time  1305    PT Time Calculation (min)  38 min    Activity Tolerance  Patient tolerated treatment well    Behavior During Therapy  [facility] for tasks assessed/performed       Past Medical History:  Diagnosis Date  . Acne   . Acute UTI 01/23/2014   to start ABX 01/24/2014  . Allergy   . Arthritis    feet, knee and base of thumbs  . Asthma    controlled  . Breast cancer, stage 1 ([place]) 07/18/2011  . Carpal tunnel syndrome    right  . Dehydration 10/21/2011  . Depression   . Fibromyalgia   . Hepatitis 1976   hepatitis B  . Hyperlipidemia   . Hypertension   . Mononucleosis 1952  . Multiple allergies   . Nephritis 1955  . Osteopenia   . PONV (postoperative nausea and vomiting)    has had to use scop patch    Past Surgical History:  Procedure Laterality Date  . ABDOMINAL HYSTERECTOMY  1981  . BREAST SURGERY    . BUNIONECTOMY  1998   left foot  . ETHMOIDECTOMY  1991  . EYE SURGERY    . facial plastic surgery  1996  . JOINT REPLACEMENT Left 2005  . KNEE ARTHROSCOPY  1996, 2001   left  . lap band surgery  2010  . MASTECTOMY Right 2012   w/chemo  . NASAL SINUS SURGERY  2001  . port-a-cath placement  06/26/11   tip in lower SVC per chest x-ray read by Dr. Ivar Drape  . right breast lumpectomy  1986  . right ear surgery  1983   tumor removed (?)  . right mastectomy  06-26-11  . TONSILLECTOMY  1950  . TOTAL KNEE ARTHROPLASTY  2006   left  . TUBAL LIGATION  1978    There were no vitals filed for this visit.  Subjective Assessment - 07/19/17 1231    Subjective  Not sure I love the yellow band.     Pertinent History  Lt and Rt TKA several years ago, HTN, depression, osteopenia, fibromyalgia.  Rt shoulder: Rt long biceps tear-avoid resisted biceps per MD    Currently in Pain?  No/denies    Multiple Pain Sites  No                      [facility] Adult PT Treatment/Exercise - 07/19/17 0001      Shoulder Exercises: Supine   Other Supine Exercises  Head press/chin tuck 2x 5 Added to HEP   Added to HEP     Traction   Type of Traction  Cervical    Min (lbs)  5    Max (  lbs)  17    Hold Time  10    Rest Time  60    Time  15             PT Education - 07/19/17 1244    Education provided  Yes    Education Details  Review of band e: bothers pt's RT UE, added supine head press/chin tuck to HEP.     Person(s) Educated  Patient    Methods  Explanation;Demonstration;Tactile cues;Verbal cues;Handout    Comprehension  Verbalized understanding;Returned demonstration       PT Short Term Goals - 07/19/17 1252      PT SHORT TERM GOAL #1   Title  be independent in initial HEP    Time  4    Period  Weeks    Status  Achieved      PT SHORT TERM GOAL #3   Title  report a 30% reduction in neck pain with ADLs and self-care    Time  4    Period  Weeks    Status  Achieved        PT Long Term Goals - 07/19/17 1252      PT LONG TERM GOAL #1   Title  be independent in advanced HEP    Time  8    Period  Weeks    Status  On-going      PT LONG TERM GOAL #4   Title  report a 70% reduction in neck pain with ADLs and self-care    Time  8    Period  Weeks    Status  Achieved            Plan - 07/19/17 1248    Clinical Impression Statement  Pt has tried to strengthen her scapula/postural  muscles at home but finds this can irritate her RT shoulder. She did less reps which helped but remains reluctant to continue. PTA suggested to her she could continue with less reps and 1-2x day or if it did bother the RT shoulder to stop completely. She agreed. We also tried supine head press/chin tuck which was easy to do and did not bother the Rt shoulder at all. This was then given for HEP to pt.  She reports she is working on being more consistent with better posture and better alignment at her head & shoulders.     Rehab Potential  Good    PT Frequency  2x / week    PT Duration  8 weeks    PT Treatment/Interventions  ADLs/Self Care Home Management;Cryotherapy;Moist Heat;Electrical Stimulation;Therapeutic exercise;Therapeutic activities;Functional mobility training;Neuromuscular re-education;Patient/family education;Passive range of motion;Manual techniques;Dry needling;Taping    PT Next Visit Plan  Review HEP given today, mechanical traction, take cervical ROM for goals.     Consulted and Agree with Plan of Care  Patient       Patient will benefit from skilled therapeutic intervention in order to improve the following deficits and impairments:  Decreased endurance, Decreased strength, Improper body mechanics, Pain, Postural dysfunction, Increased muscle spasms, Hypomobility, Impaired UE functional use, Impaired flexibility, Decreased range of motion  Visit Diagnosis: Abnormal posture  Muscle weakness (generalized)  Cervicalgia  Cramp and spasm  Pain in right upper arm     Problem List Patient Active Problem List   Diagnosis Date Noted  . Osteoarthritis of right knee 01/29/2014    Class: Diagnosis of  . 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 Acquired absence of breast and nipple 04/13/2012  . Symptomatic  mammary hypertrophy 04/13/2012  . Breast cancer, stage 1 ([place]) 07/18/2011    Corvette Orser, PTA 07/19/2017, 1:03 PM  [facility] [address], [place], [place], [address] Phone: [phone]   Fax:  [phone]  Name: [name] MRN: [id number] Date of Birth: December 14, 1945

## 2017-07-19 NOTE — Patient Instructions (Signed)
Stretch Break - Alesia Banda    We can do this laying down with your head on the pillow. GENTLY press your head back into the pillow while keeping chin down. Hold _3__ seconds. Relax and return to starting position. Repeat __3-5_times, 2x day. OR.... Do 1-2x every 30 min you are at the computer or knitting with your head down and forward.       Copyright  VHI. All rights reserved.

## 2017-07-21 ENCOUNTER — Ambulatory Visit: Payer: Medicare Other | Admitting: Physical Therapy

## 2017-07-21 DIAGNOSIS — M6281 Muscle weakness (generalized): Secondary | ICD-10-CM

## 2017-07-21 DIAGNOSIS — M542 Cervicalgia: Secondary | ICD-10-CM

## 2017-07-21 DIAGNOSIS — R293 Abnormal posture: Secondary | ICD-10-CM | POA: Diagnosis not present

## 2017-07-21 DIAGNOSIS — M79621 Pain in right upper arm: Secondary | ICD-10-CM

## 2017-07-21 DIAGNOSIS — R252 Cramp and spasm: Secondary | ICD-10-CM

## 2017-07-21 NOTE — Therapy (Addendum)
[facility] [address], [place], [place], [address] Phone: [phone]   Fax:  [phone]  Physical Therapy Treatment  Patient Details  Name: [name] MRN: [id number] Date of Birth: 17-May-1946 Referring Provider: Rodell Perna, MD   Encounter Date: 07/21/2017  PT End of Session - 07/21/17 1235    Visit Number  6    Number of Visits  10    Date for PT Re-Evaluation  08/19/17    Authorization Type  UHC medicare     Authorization Time Period  06/11/17 to 07/23/17    Authorization - Visit Number  6    Authorization - Number of Visits  13    PT Start Time  1212    PT Stop Time  1250    PT Time Calculation (min)  38 min    Activity Tolerance  Patient tolerated treatment well    Behavior During Therapy  [facility] for tasks assessed/performed       Past Medical History:  Diagnosis Date  . Acne   . Acute UTI 01/23/2014   to start ABX 01/24/2014  . Allergy   . Arthritis    feet, knee and base of thumbs  . Asthma    controlled  . Breast cancer, stage 1 ([place]) 07/18/2011  . Carpal tunnel syndrome    right  . Dehydration 10/21/2011  . Depression   . Fibromyalgia   . Hepatitis 1976   hepatitis B  . Hyperlipidemia   . Hypertension   . Mononucleosis 1952  . Multiple allergies   . Nephritis 1955  . Osteopenia   . PONV (postoperative nausea and vomiting)    has had to use scop patch    Past Surgical History:  Procedure Laterality Date  . ABDOMINAL HYSTERECTOMY  1981  . BREAST SURGERY    . BUNIONECTOMY  1998   left foot  . ETHMOIDECTOMY  1991  . EYE SURGERY    . facial plastic surgery  1996  . JOINT REPLACEMENT Left 2005  . KNEE ARTHROSCOPY  1996, 2001   left  . lap band surgery  2010  . MASTECTOMY Right 2012   w/chemo  . NASAL SINUS SURGERY  2001  . port-a-cath placement  06/26/11   tip in lower SVC per chest x-ray read by Dr. Ivar Drape  . right breast lumpectomy  1986  . right ear surgery  1983   tumor removed (?)  . right mastectomy  06-26-11  . TONSILLECTOMY  1950  . TOTAL KNEE ARTHROPLASTY  2006   left  . TUBAL LIGATION  1978    There were no vitals filed for this visit.      [facility] PT Assessment - 07/21/17 0001      Observation/Other Assessments   Focus on Therapeutic Outcomes (FOTO)   36%      AROM   Cervical Flexion  70    Cervical Extension  WNL    Cervical - Right Side Bend  35    Cervical - Left Side Bend  35    Cervical - Right Rotation  50    Cervical - Left Rotation  55      Strength   Right Shoulder Flexion  4+/5    Right Shoulder ABduction  4+/5    Right Shoulder Internal Rotation  5/5    Right Shoulder External Rotation  4/5    Left Shoulder Flexion  5/5    Left Shoulder ABduction  5/5  Left Shoulder Internal Rotation  5/5    Left Shoulder External Rotation  5/5                  [facility] Adult PT Treatment/Exercise - 07/21/17 0001      Traction   Type of Traction  Cervical    Min (lbs)  5    Max (lbs)  17    Hold Time  10    Rest Time  60    Time  10             PT Education - 07/21/17 1235    Education provided  Yes    Education Details  [place] traction unit for home use    Person(s) Educated  Patient    Methods  Explanation;Demonstration    Comprehension  Verbalized understanding;Returned demonstration       PT Short Term Goals - 07/21/17 1236      PT SHORT TERM GOAL #2   Title  demonstrate Lt cervical rotation to > or = to 35 degrees to improve safety with driving    Time  4    Period  Weeks    Status  Achieved        PT Long Term Goals - 07/21/17 1237      PT LONG TERM GOAL #1   Title  be independent in advanced HEP    Time  8    Period  Weeks    Status  Achieved pt will decide if she wants to continue with the band at home or not.   pt will decide if she wants to continue with the band at home or not.     PT LONG TERM GOAL #2   Title  reduce FOTO to < or = to 30% limitation    Time  8     Period  Weeks    Status  Not Met 36%   36%     PT LONG TERM GOAL #3   Title  demonstrate > or = to 45 degrees cervical sidebending to improve mobility with ADLs    Time  8    Period  Weeks    Status  Not Met 35 degrees bil   35 degrees bil     PT LONG TERM GOAL #5   Title  demonstrate cervical A/ROM rotation to > or = to 45 degrees to improve safety with driving    Time  8    Period  Weeks    Status  Achieved            Plan - 07/21/17 1223    Clinical Impression Statement  Pt improved her LT rotation dramatically with subsantial pain reduction in the neck by 85%-95% since her evaluation. For these reasons pt is satisfied with her functional progress  and would like to end PT today. She plans to look into a home [employer] traction unit for home. She was able to try this unit out today and was educated in where to purchase.     Rehab Potential  Good    PT Frequency  2x / week    PT Duration  8 weeks    PT Treatment/Interventions  ADLs/Self Care Home Management;Cryotherapy;Moist Heat;Electrical Stimulation;Therapeutic exercise;Therapeutic activities;Functional mobility training;Neuromuscular re-education;Patient/family education;Passive range of motion;Manual techniques;Dry needling;Taping    PT Next Visit Plan  DC per pt request today.     Consulted and Agree with Plan of Care  Patient  Patient will benefit from skilled therapeutic intervention in order to improve the following deficits and impairments:  Decreased endurance, Decreased strength, Improper body mechanics, Pain, Postural dysfunction, Increased muscle spasms, Hypomobility, Impaired UE functional use, Impaired flexibility, Decreased range of motion  Visit Diagnosis: Abnormal posture  Muscle weakness (generalized)  Cervicalgia  Cramp and spasm  Pain in right upper arm     Problem List Patient Active Problem List   Diagnosis Date Noted  . Osteoarthritis of right knee 01/29/2014    Class: Diagnosis of   . 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 Acquired absence of breast and nipple 04/13/2012  . Symptomatic mammary hypertrophy 04/13/2012  . Breast cancer, stage 1 ([place]) 07/18/2011    Myrene Galas, PTA 07/21/17 12:57 PM PHYSICAL THERAPY DISCHARGE SUMMARY  Visits from Start of Care: 6  Current functional level related to goals / functional outcomes: See above for current status.  Pt reports 85-95% overall improvement and will D/C to HEP   Remaining deficits: See above for status and strength/AROM measures   Education / Equipment: HEP, posture/body mechanics Plan: Patient agrees to discharge.  Patient goals were partially met. Patient is being discharged due to being pleased with the current functional level.  ?????        Sigurd Sos, PT 07/21/17 1:04 PM  [facility] [address] [place], [place], [address] Phone: [phone]   Fax:  [phone]  Name: ROSAURA BOLON MRN: [id number] Date of Birth: May 02, 1946

## 2017-07-23 NOTE — Telephone Encounter (Signed)
Called [name] and got order canceled prior to them starting it.

## 2017-08-12 ENCOUNTER — Other Ambulatory Visit: Payer: Medicare Other | Admitting: Orthotics

## 2017-08-19 ENCOUNTER — Encounter: Payer: Self-pay | Admitting: Registered"

## 2017-08-19 ENCOUNTER — Encounter: Payer: Medicare Other | Attending: Surgery | Admitting: Registered"

## 2017-08-19 DIAGNOSIS — Z9884 Bariatric surgery status: Secondary | ICD-10-CM | POA: Diagnosis present

## 2017-08-19 DIAGNOSIS — Z713 Dietary counseling and surveillance: Secondary | ICD-10-CM | POA: Insufficient documentation

## 2017-08-19 DIAGNOSIS — E669 Obesity, unspecified: Secondary | ICD-10-CM

## 2017-08-19 NOTE — Progress Notes (Signed)
Follow-up visit:  8 Years Post-Operative LAGB Surgery  Medical Nutrition Therapy:  Appt start time: 2:15 end time:  3:26.  Primary concerns today: Post-operative Bariatric Surgery Nutrition Management.  Non scale victories: none stated  Surgery date: 12/10/2008 Surgery type: LAGB Start weight at [facility]: N/A Weight today: 234.0 lbs Weight change: N/A Total weight lost: N/A Weight loss goal: none stated   TANITA  BODY COMP RESULTS  08/19/2017   BMI (kg/m^2) 40.2   Fat Mass (lbs) 118.8   Fat Free Mass (lbs) 115.2   Total Body Water (lbs) 83.2    Pt states she is a retired Engineer, civil (consulting). Pt states she was doing well and became diagnosed with breast cancer some years ago. Pt states while receiving chemotherapy she ate what she could tolerate, causing her to steer away from post-op bariatric recommendations. Pt states she enjoys tracking using [employer]itness[employer]. Pt reports daily average: 1200 calories, 50-70 g carbs, 60 g protein, 45-50g fat, and 21 g fiber. Pt states when she saw surgeon 3 months ago (Sept 2018) she was 249.5 lbs (15.5 lbs loss). Pt states she has not eaten crackers, fruit, bread, potatoes, pasta, or rice. Pt sates she prefers not to to eat anything with more than 1-2 grams of sugar. Pt states she notices it takes about an hour to eat food now and has to chop food to help with digestion. Pt states she does not eat breakfast in the morning because she is not hungry; drinks coffee throughout morning until lunch time. Pt states physical activity is limited due to having issues with feet and left tendon. Pt states she likes to research things and misses working in [place].   Next visit: Support Group information  Preferred Learning Style:   No preference indicated   Learning Readiness:   Contemplating  Ready  Change in progress  24-hr recall: B (AM): skips Snk (AM): none  L (PM): 2 egg omelette (12g), 1/2 c cheese (7g), asparagus Snk (PM): none  D (PM): 3 oz pot  roast (21g) with vegetables, 1/2 c mashed potatoes Snk (PM): none  Fluid intake: coffee, low-fat half and half, stevia (25 oz), decaf tea with stevia (24 oz), water (24 oz), 2 cans sparkling water (24 oz); ~97 oz Estimated total protein intake: 40 grams   Medications: See list Supplementation: MVI + Citrical slow release 1200 mg  CBG monitoring: Yes Average CBG per patient: 95-112 Last patient reported A1c: N/A  Having you been chewing well: yes Chewing/swallowing difficulties: no Last Lap-Band fill: 06/10/2017  Recent physical activity:  None stated  Progress Towards Goal(s):  In progress.  Handouts given during visit include:  Arm exercises  Phase 3B: High Protein + NS vegetables   Nutritional Diagnosis:  Hagan-3.3 Overweight/obesity related to past poor dietary habits and physical inactivity as evidenced by patient w/ recent LAGB surgery following dietary guidelines for continued weight loss.     Intervention:  Nutrition education and counseling.  Goals:  Follow Bariatric Surgery Specialized Post-Op Diet (ideas)  Aim for maximum of 15 grams of carbs per meal/10-15 grams per snack  Avoid white starches and starchy veggies (potatoes, peas, corn, etc)  Avoid sweetened drinks  Eat 3-6 small meals/snacks, every 3-5 hrs  No meal skipping; add in a small breakfast with protein   Increase lean protein foods to meet 60-80g goal  Always have a protein source with carbs  Increase fluid intake to 64oz +  Aim for >30 min of physical activity daily    Resume  daily vitamin supplementation   *(1) complete multivitamin with iron and 2-3 doses of 500 mg calcium citrate  * Make sure to separate the multivitamin and calcium by 2 hours to prevent iron/calcium from binding together and leaving your body  * Make sure to take your calcium in 3 separate doses because your body cannot absorb more than 500 mg at a time   Teaching Method Utilized:  Visual Auditory Hands  on  Barriers to learning/adherence to lifestyle change: none  Demonstrated degree of understanding via:  Teach Back   Monitoring/Evaluation:  Dietary intake, exercise, lap band fills, and body weight. Follow up in 2 months for 8 year post-op visit.

## 2017-08-19 NOTE — Patient Instructions (Addendum)
Goals:  Follow Bariatric Surgery Specialized Post-Op Diet (ideas)  Aim for maximum of 15 grams of carbs per meal/10-15 grams per snack  Avoid white starches and starchy veggies (potatoes, peas, corn, etc)  Avoid sweetened drinks  Eat 3-6 small meals/snacks, every 3-5 hrs  No meal skipping; add in a small breakfast with protein   Increase lean protein foods to meet 60-80g goal  Always have a protein source with carbs  Increase fluid intake to 64oz +  Aim for >30 min of physical activity daily    Resume daily vitamin supplementation   *(1) complete multivitamin with iron and 2-3 doses of 500 mg calcium citrate  * Make sure to separate the multivitamin and calcium by 2 hours to prevent iron/calcium from binding together and leaving your body  * Make sure to take your calcium in 3 separate doses because your body cannot absorb more than 500 mg at a time

## 2017-10-28 ENCOUNTER — Other Ambulatory Visit: Payer: Self-pay | Admitting: Family Medicine

## 2017-10-28 DIAGNOSIS — Z1231 Encounter for screening mammogram for malignant neoplasm of breast: Secondary | ICD-10-CM

## 2017-11-17 ENCOUNTER — Ambulatory Visit
Admission: RE | Admit: 2017-11-17 | Discharge: 2017-11-17 | Disposition: A | Discharge status: Home / Self Care | Payer: Medicare Other | Source: Ambulatory Visit | Attending: Family Medicine | Admitting: Family Medicine

## 2017-11-17 DIAGNOSIS — Z1231 Encounter for screening mammogram for malignant neoplasm of breast: Secondary | ICD-10-CM

## 2017-11-17 HISTORY — DX: Malignant neoplasm of unspecified site of unspecified female breast: C50.919

## 2017-11-17 HISTORY — DX: Personal history of antineoplastic chemotherapy: Z92.21

## 2018-08-18 ENCOUNTER — Encounter (INDEPENDENT_AMBULATORY_CARE_PROVIDER_SITE_OTHER): Payer: Self-pay | Admitting: Surgery

## 2018-08-18 ENCOUNTER — Ambulatory Visit (INDEPENDENT_AMBULATORY_CARE_PROVIDER_SITE_OTHER): Payer: Medicare Other | Admitting: Surgery

## 2018-08-18 ENCOUNTER — Ambulatory Visit (INDEPENDENT_AMBULATORY_CARE_PROVIDER_SITE_OTHER): Payer: Self-pay

## 2018-08-18 DIAGNOSIS — M722 Plantar fascial fibromatosis: Secondary | ICD-10-CM

## 2018-08-18 DIAGNOSIS — M79671 Pain in right foot: Secondary | ICD-10-CM

## 2018-08-18 DIAGNOSIS — M79672 Pain in left foot: Secondary | ICD-10-CM | POA: Diagnosis not present

## 2018-08-18 DIAGNOSIS — M25571 Pain in right ankle and joints of right foot: Secondary | ICD-10-CM

## 2018-08-18 DIAGNOSIS — M2141 Flat foot [pes planus] (acquired), right foot: Secondary | ICD-10-CM | POA: Diagnosis not present

## 2018-08-18 DIAGNOSIS — M2142 Flat foot [pes planus] (acquired), left foot: Secondary | ICD-10-CM

## 2018-08-18 MED ORDER — BUPIVACAINE HCL 0.5 % IJ SOLN
3.0000 mL | INTRAMUSCULAR | Status: AC | PRN
  Administered 2018-08-18: 3 mL via INTRA_ARTICULAR

## 2018-08-18 MED ORDER — METHYLPREDNISOLONE ACETATE 40 MG/ML IJ SUSP
40.0000 mg | INTRAMUSCULAR | Status: AC | PRN
  Administered 2018-08-18: 40 mg via INTRA_ARTICULAR

## 2018-08-18 NOTE — Progress Notes (Signed)
Office Visit Note   Patient: [name]           Date of Birth: 1945/10/13           MRN: [id number] Visit Date: 08/18/2018              Requested by: Maurice Small, MD [place] [place], [place] [id number] PCP: Maurice Small, MD   Assessment & Plan: Visit Diagnoses:  1. Pain in right foot   2. Pain in left foot   3. Pain in right ankle and joints of right foot     Plan: Patient was seen with Dr. Sharol Given today to discuss her bilateral foot dilemma.  He stated that definitive treatment for both feet would be ankle fusions.  Patient states that she would like to take some time to think about this.  She has if we could hopefully give some relief of her right ankle pain today.  I did offer intra-articular right ankle injection but she understands that this would not help the problem related to her bilateral posterior tibial tendon insufficiencies.  She voiced understanding.  After patient consent right anterolateral ankle was prepped with Betadine and intra-articular Marcaine/Depo-Medrol injection was performed.  No complication.  She reports some improvement of ankle pain with Marcaine in place.  Patient will follow-up in the office as needed.  If she is wanting to entertain the idea of surgery she will schedule follow-up visit with Dr. Sharol Given.  Follow-Up Instructions: Return if symptoms worsen or fail to improve.   Orders:  Orders Placed This Encounter  Procedures  . XR Foot Complete Right  . XR Foot Complete Left  . XR Ankle 2 Views Right   No orders of the defined types were placed in this encounter.     Procedures: Medium Joint Inj: R ankle on 08/18/2018 4:34 PM Indications: pain Details: 25 G 1.5 in needle, anterolateral approach Medications: 3 mL bupivacaine 0.5 %; 40 mg methylPREDNISolone acetate 40 MG/ML Consent was given by the patient. Patient was prepped and draped in the usual sterile fashion.       Clinical Data: No additional  findings.   Subjective: Chief Complaint  Patient presents with  . Right Ankle - Pain  . Right Foot - Pain  . Left Foot - Pain    HPI 72 year old white female comes in today with complaints of bilateral foot and right ankle pain.  States that she has had chronic issues with her left foot for about 15 years.  At one point she states that his surgeon recommended reconstructive surgery to help with her flatfoot deformity.  She complains of bilateral medial foot pain.  On the right side she is been having increased pain in the ankle joint over the last couple weeks.  No injury.  Also complaining of right plantar heel pain.  Patient states that she has worn orthotic shoes over the last several years with minimal improvement. Review of Systems No current cardiac pulmonary GI GU issues  Objective: Vital Signs: There were no vitals taken for this visit.  Physical Exam  Constitutional: She is oriented to person, place, and time.  HENT:  Head: Normocephalic and atraumatic.  Eyes: Pupils are equal, round, and reactive to light. EOM are normal.  Neck: Normal range of motion.  Pulmonary/Chest: No respiratory distress.  Musculoskeletal:  Patient has bilateral valgus hindfoot.  Pain with doing bilateral single-leg heel raise.  Right ankle she is moderately tender at the anterior tibiotalar  joint line.  Mild to moderate tender at the right plantar fascial calcaneal insertion.  Tender over the bilateral medial posterior tibial tendons.  Neurological: She is alert and oriented to person, place, and time.  Skin: Skin is warm and dry.  Psychiatric: She has a normal mood and affect.    Ortho Exam  Specialty Comments:  No specialty comments available.  Imaging: No results found.   PMFS History: Patient Active Problem List   Diagnosis Date Noted  . Osteoarthritis of right knee 01/29/2014    Class: Diagnosis of  . 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 Acquired absence of breast and nipple 04/13/2012  .  Symptomatic mammary hypertrophy 04/13/2012  . Breast cancer, stage 1 ([place]) 07/18/2011   Past Medical History:  Diagnosis Date  . Acne   . Acute UTI 01/23/2014   to start ABX 01/24/2014  . Allergy   . Arthritis    feet, knee and base of thumbs  . Asthma    controlled  . Breast cancer [facility]) 2012   Right Breast Cancer  . Breast cancer, stage 1 ([place]) 07/18/2011  . Carpal tunnel syndrome    right  . Dehydration 10/21/2011  . Depression   . Fibromyalgia   . Hepatitis 1976   hepatitis B  . Hyperlipidemia   . Hypertension   . Mononucleosis 1952  . Multiple allergies   . Nephritis 1955  . Osteopenia   . Personal history of chemotherapy 2012   Right Breast Cancer  . PONV (postoperative nausea and vomiting)    has had to use scop patch    Family History  Problem Relation Age of Onset  . Breast cancer Paternal Aunt   . Breast cancer Paternal Grandmother   . Stroke Other   . Hypertension Other   . Heart disease Other     Past Surgical History:  Procedure Laterality Date  . ABDOMINAL HYSTERECTOMY  1981  . BREAST CAPSULECTOMY WITH IMPLANT EXCHANGE Right 03/02/2013   Procedure: BREAST CAPSULECTOMY WITH REPOSITIONING THE IMPLANT;  Surgeon: Theodoro Kos, DO;  Location: [place];  Service: Plastics;  Laterality: Right;  . BREAST SURGERY    . BUNIONECTOMY  1998   left foot  . ETHMOIDECTOMY  1991  . EYE SURGERY    . facial plastic surgery  1996  . JOINT REPLACEMENT Left 2005  . KNEE ARTHROPLASTY Right 01/29/2014   Procedure: COMPUTER ASSISTED TOTAL KNEE ARTHROPLASTY;  Surgeon: Marybelle Killings, MD;  Location: [place];  Service: Orthopedics;  Laterality: Right;  Right Total Knee Arthroplasty  . KNEE ARTHROSCOPY  1996, 2001   left  . lap band surgery  2010  . LIPOSUCTION Bilateral 03/02/2013   Procedure: LIPOSUCTION;  Surgeon: Theodoro Kos, DO;  Location: [place];  Service: Plastics;  Laterality: Bilateral;  . MASTECTOMY Right 2012   w/chemo  . MASTOPEXY   04/13/2012   Procedure: MASTOPEXY;  Surgeon: Theodoro Kos, DO;  Location: [place];  Service: Plastics;  Laterality: Left;   left breast  mastopexy reduction for symmetry  . NASAL SINUS SURGERY  2001  . port-a-cath placement  06/26/11   tip in lower SVC per chest x-ray read by Dr. Ivar Drape  . PORT-A-CATH REMOVAL Left 03/02/2013   Procedure: REMOVAL PORT-A-CATH;  Surgeon: Rolm Bookbinder, MD;  Location: [place];  Service: General;  Laterality: Left;  . right breast lumpectomy  1986  . right ear surgery  1983   tumor removed (?)  .  right mastectomy  06-26-11  . TONSILLECTOMY  1950  . TOTAL KNEE ARTHROPLASTY  2006   left  . TUBAL LIGATION  1978   Social History   Occupational History  . Not on file  Tobacco Use  . Smoking status: Never Smoker  . Smokeless tobacco: Never Used  Substance and Sexual Activity  . Alcohol use: No    Alcohol/week: 0.0 standard drinks  . Drug use: No  . Sexual activity: Not Currently

## 2019-05-18 ENCOUNTER — Other Ambulatory Visit: Payer: Self-pay | Admitting: Family Medicine

## 2019-05-18 DIAGNOSIS — Z1231 Encounter for screening mammogram for malignant neoplasm of breast: Secondary | ICD-10-CM

## 2019-07-03 ENCOUNTER — Other Ambulatory Visit: Payer: Self-pay

## 2019-07-03 ENCOUNTER — Ambulatory Visit
Admission: RE | Admit: 2019-07-03 | Discharge: 2019-07-03 | Disposition: A | Discharge status: Home / Self Care | Payer: Medicare Other | Source: Ambulatory Visit | Attending: Family Medicine | Admitting: Family Medicine

## 2019-07-03 DIAGNOSIS — Z1231 Encounter for screening mammogram for malignant neoplasm of breast: Secondary | ICD-10-CM

## 2019-07-05 ENCOUNTER — Other Ambulatory Visit: Payer: Self-pay | Admitting: Family Medicine

## 2019-07-05 DIAGNOSIS — R928 Other abnormal and inconclusive findings on diagnostic imaging of breast: Secondary | ICD-10-CM

## 2019-07-06 ENCOUNTER — Other Ambulatory Visit: Payer: Self-pay | Admitting: Family Medicine

## 2019-07-06 ENCOUNTER — Ambulatory Visit
Admission: RE | Admit: 2019-07-06 | Discharge: 2019-07-06 | Disposition: A | Discharge status: Home / Self Care | Payer: Medicare Other | Source: Ambulatory Visit | Attending: Family Medicine | Admitting: Family Medicine

## 2019-07-06 ENCOUNTER — Other Ambulatory Visit: Payer: Self-pay

## 2019-07-06 DIAGNOSIS — R921 Mammographic calcification found on diagnostic imaging of breast: Secondary | ICD-10-CM

## 2019-07-06 DIAGNOSIS — R928 Other abnormal and inconclusive findings on diagnostic imaging of breast: Secondary | ICD-10-CM

## 2020-01-08 ENCOUNTER — Ambulatory Visit
Admission: RE | Admit: 2020-01-08 | Discharge: 2020-01-08 | Disposition: A | Discharge status: Home / Self Care | Payer: Medicare PPO | Source: Ambulatory Visit | Attending: Family Medicine | Admitting: Family Medicine

## 2020-01-08 ENCOUNTER — Other Ambulatory Visit: Payer: Self-pay

## 2020-01-08 ENCOUNTER — Other Ambulatory Visit: Payer: Self-pay | Admitting: Family Medicine

## 2020-01-08 DIAGNOSIS — R921 Mammographic calcification found on diagnostic imaging of breast: Secondary | ICD-10-CM

## 2020-07-23 ENCOUNTER — Ambulatory Visit
Admission: RE | Admit: 2020-07-23 | Discharge: 2020-07-23 | Disposition: A | Discharge status: Home / Self Care | Payer: Medicare PPO | Source: Ambulatory Visit | Attending: Family Medicine | Admitting: Family Medicine

## 2020-07-23 ENCOUNTER — Other Ambulatory Visit: Payer: Self-pay

## 2020-07-23 DIAGNOSIS — R921 Mammographic calcification found on diagnostic imaging of breast: Secondary | ICD-10-CM

## 2020-12-13 DIAGNOSIS — M79671 Pain in right foot: Secondary | ICD-10-CM | POA: Diagnosis not present

## 2020-12-13 DIAGNOSIS — M79672 Pain in left foot: Secondary | ICD-10-CM | POA: Diagnosis not present

## 2021-01-03 DIAGNOSIS — L820 Inflamed seborrheic keratosis: Secondary | ICD-10-CM | POA: Diagnosis not present

## 2021-01-03 DIAGNOSIS — D1801 Hemangioma of skin and subcutaneous tissue: Secondary | ICD-10-CM | POA: Diagnosis not present

## 2021-01-03 DIAGNOSIS — L720 Epidermal cyst: Secondary | ICD-10-CM | POA: Diagnosis not present

## 2021-01-03 DIAGNOSIS — L988 Other specified disorders of the skin and subcutaneous tissue: Secondary | ICD-10-CM | POA: Diagnosis not present

## 2021-01-14 DIAGNOSIS — H2511 Age-related nuclear cataract, right eye: Secondary | ICD-10-CM | POA: Diagnosis not present

## 2021-01-14 DIAGNOSIS — H527 Unspecified disorder of refraction: Secondary | ICD-10-CM | POA: Diagnosis not present

## 2021-01-23 DIAGNOSIS — R350 Frequency of micturition: Secondary | ICD-10-CM | POA: Diagnosis not present

## 2021-01-23 DIAGNOSIS — N3946 Mixed incontinence: Secondary | ICD-10-CM | POA: Diagnosis not present

## 2021-01-28 DIAGNOSIS — H40011 Open angle with borderline findings, low risk, right eye: Secondary | ICD-10-CM | POA: Diagnosis not present

## 2021-01-28 DIAGNOSIS — H527 Unspecified disorder of refraction: Secondary | ICD-10-CM | POA: Diagnosis not present

## 2021-04-01 DIAGNOSIS — S80869A Insect bite (nonvenomous), unspecified lower leg, initial encounter: Secondary | ICD-10-CM | POA: Diagnosis not present

## 2021-04-01 DIAGNOSIS — R1013 Epigastric pain: Secondary | ICD-10-CM | POA: Diagnosis not present

## 2021-04-01 DIAGNOSIS — W57XXXA Bitten or stung by nonvenomous insect and other nonvenomous arthropods, initial encounter: Secondary | ICD-10-CM | POA: Diagnosis not present

## 2021-04-16 DIAGNOSIS — E7521 Fabry (-Anderson) disease: Secondary | ICD-10-CM | POA: Diagnosis not present

## 2021-05-13 DIAGNOSIS — E7521 Fabry (-Anderson) disease: Secondary | ICD-10-CM | POA: Diagnosis not present

## 2021-06-18 DIAGNOSIS — Z91014 Allergy to mammalian meats: Secondary | ICD-10-CM | POA: Diagnosis not present

## 2021-06-18 DIAGNOSIS — E7521 Fabry (-Anderson) disease: Secondary | ICD-10-CM | POA: Diagnosis not present

## 2021-06-18 DIAGNOSIS — K5229 Other allergic and dietetic gastroenteritis and colitis: Secondary | ICD-10-CM | POA: Diagnosis not present

## 2021-07-07 ENCOUNTER — Other Ambulatory Visit: Payer: Self-pay

## 2021-08-13 DIAGNOSIS — J301 Allergic rhinitis due to pollen: Secondary | ICD-10-CM | POA: Diagnosis not present

## 2021-08-13 DIAGNOSIS — Z91014 Allergy to mammalian meats: Secondary | ICD-10-CM | POA: Diagnosis not present

## 2021-08-14 DIAGNOSIS — H2511 Age-related nuclear cataract, right eye: Secondary | ICD-10-CM | POA: Diagnosis not present

## 2021-08-14 DIAGNOSIS — H40023 Open angle with borderline findings, high risk, bilateral: Secondary | ICD-10-CM | POA: Diagnosis not present

## 2021-10-21 DIAGNOSIS — E538 Deficiency of other specified B group vitamins: Secondary | ICD-10-CM | POA: Diagnosis not present

## 2021-10-21 DIAGNOSIS — E559 Vitamin D deficiency, unspecified: Secondary | ICD-10-CM | POA: Diagnosis not present

## 2021-10-21 DIAGNOSIS — G8929 Other chronic pain: Secondary | ICD-10-CM | POA: Diagnosis not present

## 2021-10-21 DIAGNOSIS — M797 Fibromyalgia: Secondary | ICD-10-CM | POA: Diagnosis not present

## 2021-10-21 DIAGNOSIS — E785 Hyperlipidemia, unspecified: Secondary | ICD-10-CM | POA: Diagnosis not present

## 2021-10-21 DIAGNOSIS — Z0000 Encounter for general adult medical examination without abnormal findings: Secondary | ICD-10-CM | POA: Diagnosis not present

## 2021-10-21 DIAGNOSIS — Z1211 Encounter for screening for malignant neoplasm of colon: Secondary | ICD-10-CM | POA: Diagnosis not present

## 2021-10-21 DIAGNOSIS — Z23 Encounter for immunization: Secondary | ICD-10-CM | POA: Diagnosis not present

## 2021-10-21 DIAGNOSIS — I10 Essential (primary) hypertension: Secondary | ICD-10-CM | POA: Diagnosis not present

## 2021-10-21 DIAGNOSIS — E7521 Fabry (-Anderson) disease: Secondary | ICD-10-CM | POA: Diagnosis not present

## 2021-10-29 ENCOUNTER — Other Ambulatory Visit: Payer: Self-pay | Admitting: Family Medicine

## 2021-10-29 DIAGNOSIS — E785 Hyperlipidemia, unspecified: Secondary | ICD-10-CM

## 2021-11-05 DIAGNOSIS — Z91018 Allergy to other foods: Secondary | ICD-10-CM | POA: Diagnosis not present

## 2021-11-05 DIAGNOSIS — M797 Fibromyalgia: Secondary | ICD-10-CM | POA: Diagnosis not present

## 2021-12-05 ENCOUNTER — Other Ambulatory Visit: Payer: Self-pay | Admitting: Internal Medicine

## 2021-12-05 DIAGNOSIS — Z853 Personal history of malignant neoplasm of breast: Secondary | ICD-10-CM

## 2021-12-05 DIAGNOSIS — Z9011 Acquired absence of right breast and nipple: Secondary | ICD-10-CM

## 2021-12-05 DIAGNOSIS — R921 Mammographic calcification found on diagnostic imaging of breast: Secondary | ICD-10-CM

## 2021-12-12 ENCOUNTER — Other Ambulatory Visit: Payer: Medicare PPO

## 2021-12-26 ENCOUNTER — Ambulatory Visit
Admission: RE | Admit: 2021-12-26 | Discharge: 2021-12-26 | Disposition: A | Discharge status: Home / Self Care | Payer: No Typology Code available for payment source | Source: Ambulatory Visit | Attending: Family Medicine | Admitting: Family Medicine

## 2021-12-26 DIAGNOSIS — E785 Hyperlipidemia, unspecified: Secondary | ICD-10-CM

## 2022-01-14 ENCOUNTER — Ambulatory Visit
Admission: RE | Admit: 2022-01-14 | Discharge: 2022-01-14 | Disposition: A | Discharge status: Home / Self Care | Payer: Medicare PPO | Source: Ambulatory Visit | Attending: Internal Medicine | Admitting: Internal Medicine

## 2022-01-14 DIAGNOSIS — R921 Mammographic calcification found on diagnostic imaging of breast: Secondary | ICD-10-CM

## 2022-01-14 DIAGNOSIS — Z853 Personal history of malignant neoplasm of breast: Secondary | ICD-10-CM

## 2022-01-14 DIAGNOSIS — Z9011 Acquired absence of right breast and nipple: Secondary | ICD-10-CM

## 2022-01-30 DIAGNOSIS — M797 Fibromyalgia: Secondary | ICD-10-CM | POA: Diagnosis not present

## 2022-01-30 DIAGNOSIS — Z91018 Allergy to other foods: Secondary | ICD-10-CM | POA: Diagnosis not present

## 2022-01-30 DIAGNOSIS — K219 Gastro-esophageal reflux disease without esophagitis: Secondary | ICD-10-CM | POA: Diagnosis not present

## 2022-01-30 DIAGNOSIS — E785 Hyperlipidemia, unspecified: Secondary | ICD-10-CM | POA: Diagnosis not present

## 2022-02-11 DIAGNOSIS — R350 Frequency of micturition: Secondary | ICD-10-CM | POA: Diagnosis not present

## 2022-02-11 DIAGNOSIS — N3946 Mixed incontinence: Secondary | ICD-10-CM | POA: Diagnosis not present

## 2022-02-17 DIAGNOSIS — H40013 Open angle with borderline findings, low risk, bilateral: Secondary | ICD-10-CM | POA: Diagnosis not present

## 2022-02-17 DIAGNOSIS — H04123 Dry eye syndrome of bilateral lacrimal glands: Secondary | ICD-10-CM | POA: Diagnosis not present

## 2022-02-18 DIAGNOSIS — L218 Other seborrheic dermatitis: Secondary | ICD-10-CM | POA: Diagnosis not present

## 2022-02-18 DIAGNOSIS — L578 Other skin changes due to chronic exposure to nonionizing radiation: Secondary | ICD-10-CM | POA: Diagnosis not present

## 2022-02-18 DIAGNOSIS — D225 Melanocytic nevi of trunk: Secondary | ICD-10-CM | POA: Diagnosis not present

## 2022-02-18 DIAGNOSIS — R208 Other disturbances of skin sensation: Secondary | ICD-10-CM | POA: Diagnosis not present

## 2022-02-18 DIAGNOSIS — L304 Erythema intertrigo: Secondary | ICD-10-CM | POA: Diagnosis not present

## 2022-02-18 DIAGNOSIS — L814 Other melanin hyperpigmentation: Secondary | ICD-10-CM | POA: Diagnosis not present

## 2022-02-18 DIAGNOSIS — L820 Inflamed seborrheic keratosis: Secondary | ICD-10-CM | POA: Diagnosis not present

## 2022-02-18 DIAGNOSIS — L821 Other seborrheic keratosis: Secondary | ICD-10-CM | POA: Diagnosis not present

## 2022-02-18 DIAGNOSIS — L298 Other pruritus: Secondary | ICD-10-CM | POA: Diagnosis not present

## 2022-05-27 DIAGNOSIS — M797 Fibromyalgia: Secondary | ICD-10-CM | POA: Diagnosis not present

## 2022-05-27 DIAGNOSIS — Z91018 Allergy to other foods: Secondary | ICD-10-CM | POA: Diagnosis not present

## 2022-05-27 DIAGNOSIS — K219 Gastro-esophageal reflux disease without esophagitis: Secondary | ICD-10-CM | POA: Diagnosis not present

## 2022-05-27 DIAGNOSIS — E785 Hyperlipidemia, unspecified: Secondary | ICD-10-CM | POA: Diagnosis not present

## 2022-06-26 DIAGNOSIS — Z91018 Allergy to other foods: Secondary | ICD-10-CM | POA: Diagnosis not present

## 2022-06-26 DIAGNOSIS — Z6837 Body mass index (BMI) 37.0-37.9, adult: Secondary | ICD-10-CM | POA: Diagnosis not present

## 2022-06-26 DIAGNOSIS — K219 Gastro-esophageal reflux disease without esophagitis: Secondary | ICD-10-CM | POA: Diagnosis not present

## 2022-08-14 DIAGNOSIS — H2511 Age-related nuclear cataract, right eye: Secondary | ICD-10-CM | POA: Diagnosis not present

## 2022-08-14 DIAGNOSIS — H40023 Open angle with borderline findings, high risk, bilateral: Secondary | ICD-10-CM | POA: Diagnosis not present

## 2022-08-18 ENCOUNTER — Other Ambulatory Visit: Payer: Self-pay | Admitting: Gastroenterology

## 2022-08-18 DIAGNOSIS — K582 Mixed irritable bowel syndrome: Secondary | ICD-10-CM | POA: Diagnosis not present

## 2022-08-18 DIAGNOSIS — K219 Gastro-esophageal reflux disease without esophagitis: Secondary | ICD-10-CM | POA: Diagnosis not present

## 2022-08-18 DIAGNOSIS — R1310 Dysphagia, unspecified: Secondary | ICD-10-CM

## 2022-08-18 DIAGNOSIS — Z91014 Allergy to mammalian meats: Secondary | ICD-10-CM | POA: Diagnosis not present

## 2022-09-01 ENCOUNTER — Other Ambulatory Visit: Payer: Medicare PPO

## 2022-09-15 ENCOUNTER — Ambulatory Visit
Admission: RE | Admit: 2022-09-15 | Discharge: 2022-09-15 | Disposition: A | Discharge status: Home / Self Care | Payer: Medicare PPO | Source: Ambulatory Visit | Attending: Gastroenterology | Admitting: Gastroenterology

## 2022-09-15 ENCOUNTER — Other Ambulatory Visit: Payer: Self-pay | Admitting: Gastroenterology

## 2022-09-15 DIAGNOSIS — R1310 Dysphagia, unspecified: Secondary | ICD-10-CM

## 2022-09-29 DIAGNOSIS — K219 Gastro-esophageal reflux disease without esophagitis: Secondary | ICD-10-CM | POA: Diagnosis not present

## 2022-09-29 DIAGNOSIS — K582 Mixed irritable bowel syndrome: Secondary | ICD-10-CM | POA: Diagnosis not present

## 2022-09-30 DIAGNOSIS — Z4651 Encounter for fitting and adjustment of gastric lap band: Secondary | ICD-10-CM | POA: Diagnosis not present

## 2022-09-30 DIAGNOSIS — R1310 Dysphagia, unspecified: Secondary | ICD-10-CM | POA: Diagnosis not present

## 2022-09-30 DIAGNOSIS — Z9884 Bariatric surgery status: Secondary | ICD-10-CM | POA: Diagnosis not present

## 2022-10-01 ENCOUNTER — Other Ambulatory Visit: Payer: Self-pay | Admitting: General Surgery

## 2022-10-01 DIAGNOSIS — R1310 Dysphagia, unspecified: Secondary | ICD-10-CM

## 2022-10-19 DIAGNOSIS — J301 Allergic rhinitis due to pollen: Secondary | ICD-10-CM | POA: Diagnosis not present

## 2022-10-19 DIAGNOSIS — Z91014 Allergy to mammalian meats: Secondary | ICD-10-CM | POA: Diagnosis not present

## 2022-10-19 DIAGNOSIS — R5383 Other fatigue: Secondary | ICD-10-CM | POA: Diagnosis not present

## 2022-10-27 ENCOUNTER — Other Ambulatory Visit (HOSPITAL_COMMUNITY): Payer: Self-pay | Admitting: General Surgery

## 2022-10-27 DIAGNOSIS — R1310 Dysphagia, unspecified: Secondary | ICD-10-CM

## 2022-11-02 DIAGNOSIS — R5383 Other fatigue: Secondary | ICD-10-CM | POA: Diagnosis not present

## 2022-11-02 DIAGNOSIS — Z91014 Allergy to mammalian meats: Secondary | ICD-10-CM | POA: Diagnosis not present

## 2022-11-02 DIAGNOSIS — J301 Allergic rhinitis due to pollen: Secondary | ICD-10-CM | POA: Diagnosis not present

## 2022-11-13 ENCOUNTER — Other Ambulatory Visit: Payer: Medicare PPO

## 2022-11-16 ENCOUNTER — Ambulatory Visit (HOSPITAL_COMMUNITY): Admission: RE | Admit: 2022-11-16 | Payer: Medicare PPO | Source: Ambulatory Visit

## 2022-11-16 ENCOUNTER — Other Ambulatory Visit (HOSPITAL_COMMUNITY): Payer: Medicare PPO

## 2022-11-16 ENCOUNTER — Ambulatory Visit (HOSPITAL_COMMUNITY): Payer: Medicare PPO

## 2022-11-16 ENCOUNTER — Other Ambulatory Visit: Payer: Medicare PPO

## 2022-11-20 ENCOUNTER — Ambulatory Visit (HOSPITAL_COMMUNITY): Payer: Medicare PPO

## 2022-11-20 ENCOUNTER — Other Ambulatory Visit (HOSPITAL_COMMUNITY): Payer: Medicare PPO

## 2022-11-20 ENCOUNTER — Encounter (HOSPITAL_COMMUNITY): Payer: Self-pay

## 2022-11-27 ENCOUNTER — Ambulatory Visit (HOSPITAL_COMMUNITY)
Admission: RE | Admit: 2022-11-27 | Discharge: 2022-11-27 | Disposition: A | Discharge status: Home / Self Care | Payer: Medicare PPO | Source: Ambulatory Visit | Attending: General Surgery | Admitting: General Surgery

## 2022-11-27 ENCOUNTER — Other Ambulatory Visit (HOSPITAL_COMMUNITY): Payer: Self-pay | Admitting: General Surgery

## 2022-11-27 ENCOUNTER — Encounter: Payer: Self-pay | Admitting: General Surgery

## 2022-11-27 DIAGNOSIS — R1310 Dysphagia, unspecified: Secondary | ICD-10-CM | POA: Insufficient documentation

## 2022-11-27 DIAGNOSIS — K224 Dyskinesia of esophagus: Secondary | ICD-10-CM | POA: Diagnosis not present

## 2022-12-14 DIAGNOSIS — Z411 Encounter for cosmetic surgery: Secondary | ICD-10-CM | POA: Diagnosis not present

## 2022-12-14 DIAGNOSIS — L723 Sebaceous cyst: Secondary | ICD-10-CM | POA: Diagnosis not present

## 2022-12-14 DIAGNOSIS — L821 Other seborrheic keratosis: Secondary | ICD-10-CM | POA: Diagnosis not present

## 2023-01-18 DIAGNOSIS — L720 Epidermal cyst: Secondary | ICD-10-CM | POA: Diagnosis not present

## 2023-01-18 DIAGNOSIS — L728 Other follicular cysts of the skin and subcutaneous tissue: Secondary | ICD-10-CM | POA: Diagnosis not present

## 2023-01-21 ENCOUNTER — Other Ambulatory Visit: Payer: Self-pay | Admitting: Internal Medicine

## 2023-01-21 DIAGNOSIS — Z1231 Encounter for screening mammogram for malignant neoplasm of breast: Secondary | ICD-10-CM

## 2023-01-26 NOTE — Progress Notes (Signed)
Surgery orders requested via Epic inbox. °

## 2023-01-29 ENCOUNTER — Ambulatory Visit: Payer: Self-pay | Admitting: General Surgery

## 2023-01-29 NOTE — Progress Notes (Signed)
Spoke with Joni Reining at Dr. Tawana Scale office requesting orders in epic from Careers adviser.

## 2023-02-01 NOTE — Progress Notes (Signed)
COVID Vaccine received:  [] No [x] Yes Date of any COVID positive Test in last 90 days:  PCP - Raju, Sneha MD Cardiologist -   Chest x-ray -  EKG - 01/24/14 EPIC  Stress Test -  ECHO - 01/30/13 EPIC Cardiac Cath -   Bowel Prep - [] No  []  Yes ______  Pacemaker / ICD device [] No [] Yes   Spinal Cord Stimulator:[] No [] Yes       History of Sleep Apnea? [] No [] Yes   CPAP used?- [] No [] Yes    Does the patient monitor blood sugar?          [x] No [] Yes  [] N/A  Patient has: [x] NO Hx DM   [] Pre-DM                 [] DM1  []  DM2 Does patient have a Freestyle Libre or Dexacom? [] No [] Yes   Fasting Blood Sugar Ranges-  Checks Blood Sugar _____ times a day  GLP1 agonist / usual dose -  GLP1 instructions:  SGLT-2 inhibitors / usual dose -  SGLT-2 instructions:   Blood Thinner / Instructions: Aspirin Instructions:  Comments:   Activity level: Patient is able / unable to climb a flight of stairs without difficulty; [] No CP  [] No SOB, but would have ___   Patient can / can not perform ADLs without assistance.   Anesthesia review:   Patient denies shortness of breath, fever, cough and chest pain at PAT appointment.  Patient verbalized understanding and agreement to the Pre-Surgical Instructions that were given to them at this PAT appointment. Patient was also educated of the need to review these PAT instructions again prior to his/her surgery.I reviewed the appropriate phone numbers to call if they have any and questions or concerns.  

## 2023-02-01 NOTE — Progress Notes (Signed)
COVID Vaccine received:  []  No [x]  Yes Date of any COVID positive Test in last 90 days:  PCP - Hillard Danker MD Cardiologist -   Chest x-ray -  EKG - 01/24/14 EPIC  Stress Test -  ECHO - 01/30/13 EPIC Cardiac Cath -   Bowel Prep - []  No  []   Yes ______  Pacemaker / ICD device []  No []  Yes   Spinal Cord Stimulator:[]  No []  Yes       History of Sleep Apnea? []  No []  Yes   CPAP used?- []  No []  Yes    Does the patient monitor blood sugar?          [x]  No []  Yes  []  N/A  Patient has: [x]  NO Hx DM   []  Pre-DM                 []  DM1  []   DM2 Does patient have a [employer] or Dexacom? []  No []  Yes   Fasting Blood Sugar Ranges-  Checks Blood Sugar _____ times a day  GLP1 agonist / usual dose -  GLP1 instructions:  SGLT-2 inhibitors / usual dose -  SGLT-2 instructions:   Blood Thinner / Instructions: Aspirin Instructions:  Comments:   Activity level: Patient is able / unable to climb a flight of stairs without difficulty; []  No CP  []  No SOB, but would have ___   Patient can / can not perform ADLs without assistance.   Anesthesia review:   Patient denies shortness of breath, fever, cough and chest pain at PAT appointment.  Patient verbalized understanding and agreement to the Pre-Surgical Instructions that were given to them at this PAT appointment. Patient was also educated of the need to review these PAT instructions again prior to his/her surgery.I reviewed the appropriate phone numbers to call if they have any and questions or concerns.

## 2023-02-01 NOTE — Patient Instructions (Signed)
SURGICAL WAITING ROOM VISITATION  Patients having surgery or a procedure may have no more than 2 support people in the waiting area - these visitors may rotate.    Children under the age of 40 must have an adult with them who is not the patient.  Due to an increase in RSV and influenza rates and associated hospitalizations, children ages 71 and under may not visit patients in [facility] hospitals.  If the patient needs to stay at the hospital during part of their recovery, the visitor guidelines for inpatient rooms apply. Pre-op nurse will coordinate an appropriate time for 1 support person to accompany patient in pre-op.  This support person may not rotate.    Please refer to the [facility] website for the visitor guidelines for Inpatients (after your surgery is over and you are in a regular room).    Your procedure is scheduled on: Feb 12, 2023   Report to [facility] Main Entrance    Report to admitting at 7:45 AM   Call this number if you have problems the morning of surgery [phone]   Do not eat food :After Midnight.   After Midnight you may have the following liquids until 7 AM DAY OF SURGERY  Water Non-Citrus Juices (without pulp, NO RED-Apple, White grape, White cranberry) Black Coffee (NO MILK/CREAM OR CREAMERS, sugar ok)  Clear Tea (NO MILK/CREAM OR CREAMERS, sugar ok) regular and decaf                             Plain Jell-O (NO RED)                                           Fruit ices (not with fruit pulp, NO RED)                                     Popsicles (NO RED)                                                               Sports drinks like Gatorade (NO RED)                 FOLLOW BOWEL PREP AND ANY ADDITIONAL PRE OP INSTRUCTIONS YOU RECEIVED FROM YOUR SURGEON'S OFFICE!!!     Oral Hygiene is also important to reduce your risk of infection.                                    Remember - BRUSH YOUR TEETH THE MORNING OF SURGERY WITH YOUR REGULAR  TOOTHPASTE  DENTURES WILL BE REMOVED PRIOR TO SURGERY PLEASE DO NOT APPLY "Poly grip" OR ADHESIVES!!!      Take these medicines the morning of surgery with A SIP OF WATER: Wellbutrin, Zyrtec,  Myrbetriq, Rosuvastatin                                You may  not have any metal on your body including hair pins, jewelry, and body piercing             Do not wear make-up, lotions, powders, perfumes or deodorant  Do not wear nail polish including gel and S&S, artificial/acrylic nails, or any other type of covering on natural nails including finger and toenails. If you have artificial nails, gel coating, etc. that needs to be removed by a nail salon please have this removed prior to surgery or surgery may need to be canceled/ delayed if the surgeon/ anesthesia feels like they are unable to be safely monitored.   Do not shave  48 hours prior to surgery.    Do not bring valuables to the hospital. [facility] IS NOT             RESPONSIBLE   FOR VALUABLES.   Contacts, glasses, dentures or bridgework may not be worn into surgery.  DO NOT BRING YOUR HOME MEDICATIONS TO THE HOSPITAL. PHARMACY WILL DISPENSE MEDICATIONS LISTED ON YOUR MEDICATION LIST TO YOU DURING YOUR ADMISSION IN THE HOSPITAL!    Patients discharged on the day of surgery will not be allowed to drive home.  Someone NEEDS to stay with you for the first 24 hours after anesthesia.   Special Instructions: Bring a copy of your healthcare power of attorney and living will documents the day of surgery if you haven't scanned them before.              Please read over the following fact sheets you were given: IF YOU HAVE QUESTIONS ABOUT YOUR PRE-OP INSTRUCTIONS PLEASE CALL [phone] Contras   If you received a COVID test during your pre-op visit  it is requested that you wear a mask when out in public, stay away from anyone that may not be feeling well and notify your surgeon if you develop symptoms. If you test positive for Covid or  have been in contact with anyone that has tested positive in the last 10 days please notify you surgeon.    [place] - Preparing for Surgery Before surgery, you can play an important role.  Because skin is not sterile, your skin needs to be as free of germs as possible.  You can reduce the number of germs on your skin by washing with CHG (chlorahexidine gluconate) soap before surgery.  CHG is an antiseptic cleaner which kills germs and bonds with the skin to continue killing germs even after washing. Please DO NOT use if you have an allergy to CHG or antibacterial soaps.  If your skin becomes reddened/irritated stop using the CHG and inform your nurse when you arrive at Short Stay. Do not shave (including legs and underarms) for at least 48 hours prior to the first CHG shower.  You may shave your face/neck.  Please follow these instructions carefully:  1.  Shower with CHG Soap the night before surgery and the  morning of surgery.  2.  If you choose to wash your hair, wash your hair first as usual with your normal  shampoo.  3.  After you shampoo, rinse your hair and body thoroughly to remove the shampoo.                             4.  Use CHG as you would any other liquid soap.  You can apply chg directly to the skin and wash.  Gently with a scrungie or clean  washcloth.  5.  Apply the CHG Soap to your body ONLY FROM THE NECK DOWN.   Do   not use on face/ open                           Wound or open sores. Avoid contact with eyes, ears mouth and   genitals (private parts).                       Wash face,  Genitals (private parts) with your normal soap.             6.  Wash thoroughly, paying special attention to the area where your    surgery  will be performed.  7.  Thoroughly rinse your body with warm water from the neck down.  8.  DO NOT shower/wash with your normal soap after using and rinsing off the CHG Soap.                9.  Pat yourself dry with a clean towel.            10.  Wear  clean pajamas.            11.  Place clean sheets on your bed the night of your first shower and do not  sleep with pets. Day of Surgery : Do not apply any lotions/deodorants the morning of surgery.  Please wear clean clothes to the hospital/surgery center.  FAILURE TO FOLLOW THESE INSTRUCTIONS MAY RESULT IN THE CANCELLATION OF YOUR SURGERY  PATIENT SIGNATURE_________________________________  NURSE SIGNATURE__________________________________  ________________________________________________________________________

## 2023-02-02 ENCOUNTER — Other Ambulatory Visit: Payer: Self-pay

## 2023-02-02 ENCOUNTER — Encounter (HOSPITAL_COMMUNITY)
Admission: RE | Admit: 2023-02-02 | Discharge: 2023-02-02 | Disposition: A | Discharge status: Home / Self Care | Payer: Medicare PPO | Source: Ambulatory Visit | Attending: General Surgery | Admitting: General Surgery

## 2023-02-02 ENCOUNTER — Encounter (HOSPITAL_COMMUNITY): Payer: Self-pay

## 2023-02-02 VITALS — BP 144/76 | HR 58 | Temp 97.9°F | Resp 18 | Ht 64.0 in | Wt 220.0 lb

## 2023-02-02 DIAGNOSIS — Z853 Personal history of malignant neoplasm of breast: Secondary | ICD-10-CM | POA: Diagnosis not present

## 2023-02-02 DIAGNOSIS — Z8619 Personal history of other infectious and parasitic diseases: Secondary | ICD-10-CM | POA: Diagnosis not present

## 2023-02-02 DIAGNOSIS — M797 Fibromyalgia: Secondary | ICD-10-CM | POA: Diagnosis not present

## 2023-02-02 DIAGNOSIS — K224 Dyskinesia of esophagus: Secondary | ICD-10-CM | POA: Insufficient documentation

## 2023-02-02 DIAGNOSIS — R1310 Dysphagia, unspecified: Secondary | ICD-10-CM | POA: Insufficient documentation

## 2023-02-02 DIAGNOSIS — I10 Essential (primary) hypertension: Secondary | ICD-10-CM | POA: Diagnosis not present

## 2023-02-02 DIAGNOSIS — Z01818 Encounter for other preprocedural examination: Secondary | ICD-10-CM | POA: Insufficient documentation

## 2023-02-02 DIAGNOSIS — Z9011 Acquired absence of right breast and nipple: Secondary | ICD-10-CM | POA: Insufficient documentation

## 2023-02-02 HISTORY — DX: Anxiety disorder, unspecified: F41.9

## 2023-02-02 HISTORY — DX: Personal history of other diseases of the digestive system: Z87.19

## 2023-02-02 LAB — CBC
HCT: 39.9 % (ref 36.0–46.0)
Hemoglobin: 13.1 g/dL (ref 12.0–15.0)
MCH: 30.7 pg (ref 26.0–34.0)
MCHC: 32.8 g/dL (ref 30.0–36.0)
MCV: 93.4 fL (ref 80.0–100.0)
Platelets: 215 10*3/uL (ref 150–400)
RBC: 4.27 MIL/uL (ref 3.87–5.11)
RDW: 13.1 % (ref 11.5–15.5)
WBC: 7.3 10*3/uL (ref 4.0–10.5)
nRBC: 0 % (ref 0.0–0.2)

## 2023-02-02 LAB — BASIC METABOLIC PANEL
Anion gap: 7 (ref 5–15)
BUN: 26 mg/dL — ABNORMAL HIGH (ref 8–23)
CO2: 27 mmol/L (ref 22–32)
Calcium: 9.5 mg/dL (ref 8.9–10.3)
Chloride: 103 mmol/L (ref 98–111)
Creatinine, Ser: 0.72 mg/dL (ref 0.44–1.00)
GFR, Estimated: >60 mL/min (ref >60)
Glucose, Bld: 85 mg/dL (ref 70–99)
Potassium: 3.9 mmol/L (ref 3.5–5.1)
Sodium: 137 mmol/L (ref 135–145)

## 2023-02-04 NOTE — Progress Notes (Signed)
Case: [id number] Date/Time: 02/12/23 0945   Procedure: LAPAROSCOPIC REMOVAL OF ADJUSTABLE GASTRIC BAND AND COMPONENTS   Anesthesia type: General   Pre-op diagnosis: ESOPHAGEAL DYSMOTILITY   Location: [facility] ROOM 01 / [facility]   Surgeons: Gaynelle Adu, MD       DISCUSSION: [name] is a 77 yo female who presents to PAT prior to surgery on 5/31. Patient had lap band surgery in 2010. She started to have dysphagia in the Fall 2023 and followed up with GI. They did a esophogram which showed that her lap band is too tight. Now scheduled for surgery above. Past anesthesia complications include PONV  PMH significant for Alpha gal syndrome, HTN, hx of R breast cancer s/p mastectomy and chemo (2012), hx of Hep B, fibromyalgia. She reports extreme nausea from anesthesia and she cannot use medications, numbing products, or sutures that are animal sourced.  VS: BP (!) 144/76   Pulse (!) 58   Temp 36.6 C (Oral)   Resp 18   Ht 5\' 4"  (1.626 m)   Wt 99.8 kg   SpO2 96%   BMI 37.76 kg/m   PROVIDERS: Thana Ates, MD   LABS: Labs reviewed: Acceptable for surgery. (all labs ordered are listed, but only abnormal results are displayed)  Labs Reviewed  BASIC METABOLIC PANEL - Abnormal; Notable for the following components:      Result Value   BUN 26 (*)    All other components within normal limits  CBC     IMAGES:  11/27/22:  IMPRESSION: 1. Mild to moderate residual narrowing at the GE junction/level of the deflated lap band, improved from the prior esophagram but which obstructs a barium tablet. 2. Esophageal dysmotility with intermittent tertiary contractions.   EKG 02/02/23:  Sinus rhythm with artifact Septal infarct, age undetermined Appears similar to prior EKG   CV:  Echo 01/30/2013: Study Conclusions   - Left ventricle: Wall thickness was increased in a pattern    of mild LVH. The estimated ejection fraction was 60%. Wall    motion was normal; there were no regional wall  motion    abnormalities.  - Right atrium: The atrium was mildly dilated.  Transthoracic echocardiography.  M-mode, complete 2D,  spectral Doppler, and color Doppler.  Height:  Height:  162.6cm. Height: 64in.  Weight:  Weight: 106.1kg. Weight:  233.5lb.  Body mass index:  BMI: 40.2kg/m^2.  Body surface  area:    BSA: 2.7m^2.  Blood pressure:     123/74.  Patient  status:  Outpatient.  Location:  Echo laboratory.    Past Medical History:  Diagnosis Date   Acne    Acute UTI 01/23/2014   to start ABX 01/24/2014   Allergy    Anxiety    Arthritis    feet, knee and base of thumbs   Asthma    controlled   Breast cancer (HCC) 2012   Right Breast Cancer   Breast cancer, stage 1 (HCC) 07/18/2011   Carpal tunnel syndrome    right   Dehydration 10/21/2011   Depression    Fibromyalgia    Hepatitis 1976   hepatitis B   History of hiatal hernia    Hyperlipidemia    Hypertension    Mononucleosis 1952   Multiple allergies    Nephritis 1955   Osteopenia    Personal history of chemotherapy 2012   Right Breast Cancer   PONV (postoperative nausea and vomiting)    has had to use scop patch  Past Surgical History:  Procedure Laterality Date   ABDOMINAL HYSTERECTOMY  09/15/1979   BREAST CAPSULECTOMY WITH IMPLANT EXCHANGE Right 03/02/2013   Procedure: BREAST CAPSULECTOMY WITH REPOSITIONING THE IMPLANT;  Surgeon: Wayland Denis, DO;  Location: [facility];  Service: Plastics;  Laterality: Right;   BREAST SURGERY     BUNIONECTOMY  09/14/1996   left foot   CATARACT EXTRACTION Left    ETHMOIDECTOMY  09/14/1989   EYE SURGERY     facial plastic surgery  09/14/1994   JOINT REPLACEMENT Left 09/15/2003   KNEE ARTHROPLASTY Right 01/29/2014   Procedure: COMPUTER ASSISTED TOTAL KNEE ARTHROPLASTY;  Surgeon: Eldred Manges, MD;  [facility];  Service: Orthopedics;  Laterality: Right;  Right Total Knee Arthroplasty   KNEE ARTHROSCOPY  1996, 2001   left   lap band surgery   09/14/2008   LIPOSUCTION Bilateral 03/02/2013   Procedure: LIPOSUCTION;  Surgeon: Wayland Denis, DO;  Location: [facility];  Service: Plastics;  Laterality: Bilateral;   MASTECTOMY Right 09/14/2010   w/chemo   MASTOPEXY  04/13/2012   Procedure: MASTOPEXY;  Surgeon: Wayland Denis, DO;  Location: [facility];  Service: Plastics;  Laterality: Left;   left breast  mastopexy reduction for symmetry   NASAL SINUS SURGERY  09/15/1999   port-a-cath placement  06/26/2011   tip in lower SVC per chest x-ray read by Dr. Dwyane Dee   [facility] REMOVAL Left 03/02/2013   Procedure: REMOVAL PORT-A-CATH;  Surgeon: [name], MD;  Location: [facility];  Service: General;  Laterality: Left;   right breast lumpectomy  09/14/1984   right ear surgery  09/14/1981   tumor removed (?)   right mastectomy  06/26/2011   TONSILLECTOMY  09/14/1948   TOTAL KNEE ARTHROPLASTY  09/14/2004   left   TUBAL LIGATION  09/14/1976    MEDICATIONS:  Ascorbic Acid (VITAMIN C PO)   buPROPion (WELLBUTRIN XL) 150 MG 24 hr tablet   cetirizine (ZYRTEC) 10 MG tablet   Cholecalciferol (VITAMIN D-3) 5000 units TABS   Cyanocobalamin (VITAMIN B-12 PO)   diphenhydrAMINE (BENADRYL) 25 mg capsule   Famotidine (PEPCID PO)   FLUTICASONE PROPIONATE HFA IN   Krill Oil 300 MG CAPS   lisinopril-hydrochlorothiazide (ZESTORETIC) 10-12.5 MG tablet   magnesium gluconate (MAGONATE) 500 MG tablet   Multiple Vitamin (MULTIVITAMIN) capsule   MYRBETRIQ 50 MG TB24 tablet   Omega-3 Fatty Acids (FISH OIL PO)   PRESCRIPTION MEDICATION   rosuvastatin (CRESTOR) 5 MG tablet   traMADol (ULTRAM) 50 MG tablet   traZODone (DESYREL) 50 MG tablet   tretinoin (RETIN-A) 0.1 % cream   UNABLE TO FIND   Varenicline Tartrate (TYRVAYA) 0.03 MG/ACT SOLN   No current facility-administered medications for this encounter.    Marcille Blanco [facility] Short Stay/Anesthesiology [facility] Phone [phone] 02/04/2023 3:19 PM

## 2023-02-04 NOTE — Anesthesia Preprocedure Evaluation (Addendum)
Anesthesia Evaluation  Patient identified by MRN, date of birth, ID band Patient awake    Reviewed: Allergy & Precautions, NPO status , Patient's Chart, lab work & pertinent test results  History of Anesthesia Complications (+) PONV and history of anesthetic complications  Airway Mallampati: I       Dental no notable dental hx.    Pulmonary    Pulmonary exam normal        Cardiovascular hypertension, Pt. on medications Normal cardiovascular exam     Neuro/Psych  PSYCHIATRIC DISORDERS Anxiety Depression       GI/Hepatic   Endo/Other    Renal/GU      Musculoskeletal   Abdominal Normal abdominal exam  (+)   Peds  Hematology   Anesthesia Other Findings   Reproductive/Obstetrics                             Anesthesia Physical Anesthesia Plan  ASA: 2  Anesthesia Plan: General   Post-op Pain Management:    Induction:   PONV Risk Score and Plan: 4 or greater and Ondansetron and Treatment may vary due to age or medical condition  Airway Management Planned: Oral ETT  Additional Equipment: None  Intra-op Plan:   Post-operative Plan: Extubation in OR  Informed Consent: I have reviewed the patients History and Physical, chart, labs and discussed the procedure including the risks, benefits and alternatives for the proposed anesthesia with the patient or authorized representative who has indicated his/her understanding and acceptance.     Dental advisory given  Plan Discussed with: CRNA  Anesthesia Plan Comments: (See PAT note from 5/21 by K Gekas PA-C. Pt has Alpha-gal allergy and PONV )        Anesthesia Quick Evaluation

## 2023-02-05 DIAGNOSIS — Z79899 Other long term (current) drug therapy: Secondary | ICD-10-CM | POA: Diagnosis not present

## 2023-02-05 DIAGNOSIS — Z1331 Encounter for screening for depression: Secondary | ICD-10-CM | POA: Diagnosis not present

## 2023-02-05 DIAGNOSIS — J309 Allergic rhinitis, unspecified: Secondary | ICD-10-CM | POA: Diagnosis not present

## 2023-02-05 DIAGNOSIS — Z91018 Allergy to other foods: Secondary | ICD-10-CM | POA: Diagnosis not present

## 2023-02-05 DIAGNOSIS — Z9181 History of falling: Secondary | ICD-10-CM | POA: Diagnosis not present

## 2023-02-05 DIAGNOSIS — I10 Essential (primary) hypertension: Secondary | ICD-10-CM | POA: Diagnosis not present

## 2023-02-05 DIAGNOSIS — R600 Localized edema: Secondary | ICD-10-CM | POA: Diagnosis not present

## 2023-02-05 DIAGNOSIS — Z0000 Encounter for general adult medical examination without abnormal findings: Secondary | ICD-10-CM | POA: Diagnosis not present

## 2023-02-05 DIAGNOSIS — M8589 Other specified disorders of bone density and structure, multiple sites: Secondary | ICD-10-CM | POA: Diagnosis not present

## 2023-02-05 DIAGNOSIS — E785 Hyperlipidemia, unspecified: Secondary | ICD-10-CM | POA: Diagnosis not present

## 2023-02-05 DIAGNOSIS — M797 Fibromyalgia: Secondary | ICD-10-CM | POA: Diagnosis not present

## 2023-02-11 ENCOUNTER — Ambulatory Visit: Payer: Medicare PPO

## 2023-02-12 ENCOUNTER — Ambulatory Visit (HOSPITAL_COMMUNITY): Payer: Medicare PPO | Admitting: Physician Assistant

## 2023-02-12 ENCOUNTER — Ambulatory Visit (HOSPITAL_BASED_OUTPATIENT_CLINIC_OR_DEPARTMENT_OTHER): Payer: Medicare PPO | Admitting: Certified Registered Nurse Anesthetist

## 2023-02-12 ENCOUNTER — Ambulatory Visit (HOSPITAL_COMMUNITY)
Admission: RE | Admit: 2023-02-12 | Discharge: 2023-02-12 | Disposition: A | Discharge status: Home / Self Care | Payer: Medicare PPO | Attending: General Surgery | Admitting: General Surgery

## 2023-02-12 ENCOUNTER — Encounter (HOSPITAL_COMMUNITY): Payer: Self-pay | Admitting: General Surgery

## 2023-02-12 ENCOUNTER — Encounter (HOSPITAL_COMMUNITY)
Admission: RE | Disposition: A | Discharge status: Home / Self Care | Payer: Self-pay | Source: Home / Self Care | Attending: General Surgery

## 2023-02-12 DIAGNOSIS — R1310 Dysphagia, unspecified: Secondary | ICD-10-CM | POA: Diagnosis not present

## 2023-02-12 DIAGNOSIS — I10 Essential (primary) hypertension: Secondary | ICD-10-CM | POA: Insufficient documentation

## 2023-02-12 DIAGNOSIS — K2289 Other specified disease of esophagus: Secondary | ICD-10-CM

## 2023-02-12 DIAGNOSIS — K224 Dyskinesia of esophagus: Secondary | ICD-10-CM | POA: Diagnosis not present

## 2023-02-12 DIAGNOSIS — F418 Other specified anxiety disorders: Secondary | ICD-10-CM

## 2023-02-12 DIAGNOSIS — Z9884 Bariatric surgery status: Secondary | ICD-10-CM | POA: Diagnosis not present

## 2023-02-12 SURGERY — REMOVAL, GASTRIC BAND, LAPAROSCOPIC
Anesthesia: General

## 2023-02-12 MED ORDER — CHLORHEXIDINE GLUCONATE CLOTH 2 % EX PADS: 6.0000 | MEDICATED_PAD | Freq: Once | CUTANEOUS | Status: DC

## 2023-02-12 MED ORDER — ROCURONIUM BROMIDE 100 MG/10ML IV SOLN
INTRAVENOUS | Status: DC | PRN
  Administered 2023-02-12: 50 mg via INTRAVENOUS
  Administered 2023-02-12: 20 mg via INTRAVENOUS

## 2023-02-12 MED ORDER — CEFAZOLIN SODIUM-DEXTROSE 2-4 GM/100ML-% IV SOLN
2.0000 g | INTRAVENOUS | Status: AC
  Administered 2023-02-12: 2 g via INTRAVENOUS
  Filled 2023-02-12: qty 100

## 2023-02-12 MED ORDER — FENTANYL CITRATE (PF) 100 MCG/2ML IJ SOLN
INTRAMUSCULAR | Status: AC
  Filled 2023-02-12: qty 2

## 2023-02-12 MED ORDER — SUGAMMADEX SODIUM 500 MG/5ML IV SOLN
INTRAVENOUS | Status: DC | PRN
  Administered 2023-02-12: 400 mg via INTRAVENOUS

## 2023-02-12 MED ORDER — GLYCOPYRROLATE 0.2 MG/ML IJ SOLN
INTRAMUSCULAR | Status: AC
  Filled 2023-02-12: qty 1

## 2023-02-12 MED ORDER — ACETAMINOPHEN 10 MG/ML IV SOLN
1000.0000 mg | Freq: Once | INTRAVENOUS | Status: DC | PRN
  Administered 2023-02-12: 1000 mg via INTRAVENOUS

## 2023-02-12 MED ORDER — ONDANSETRON HCL 4 MG/2ML IJ SOLN
4.0000 mg | Freq: Once | INTRAMUSCULAR | Status: AC | PRN
  Administered 2023-02-12: 4 mg via INTRAVENOUS

## 2023-02-12 MED ORDER — MIDAZOLAM HCL 5 MG/5ML IJ SOLN
INTRAMUSCULAR | Status: DC | PRN
  Administered 2023-02-12: 2 mg via INTRAVENOUS

## 2023-02-12 MED ORDER — PROPOFOL 10 MG/ML IV BOLUS
INTRAVENOUS | Status: DC | PRN
  Administered 2023-02-12: 150 mg via INTRAVENOUS

## 2023-02-12 MED ORDER — ACETAMINOPHEN 10 MG/ML IV SOLN
INTRAVENOUS | Status: AC
  Filled 2023-02-12: qty 100

## 2023-02-12 MED ORDER — MIDAZOLAM HCL 2 MG/2ML IJ SOLN
INTRAMUSCULAR | Status: AC
  Filled 2023-02-12: qty 2

## 2023-02-12 MED ORDER — OXYCODONE HCL 5 MG PO TABS
ORAL_TABLET | ORAL | Status: AC
  Filled 2023-02-12: qty 1

## 2023-02-12 MED ORDER — LIDOCAINE HCL (PF) 2 % IJ SOLN
INTRAMUSCULAR | Status: AC
  Filled 2023-02-12: qty 5

## 2023-02-12 MED ORDER — DEXAMETHASONE SODIUM PHOSPHATE 10 MG/ML IJ SOLN
INTRAMUSCULAR | Status: DC | PRN
  Administered 2023-02-12: 10 mg via INTRAVENOUS

## 2023-02-12 MED ORDER — LACTATED RINGERS IV SOLN: INTRAVENOUS | Status: DC

## 2023-02-12 MED ORDER — BUPIVACAINE HCL (PF) 0.25 % IJ SOLN
INTRAMUSCULAR | Status: DC | PRN
  Administered 2023-02-12: 50 mL

## 2023-02-12 MED ORDER — DEXAMETHASONE SODIUM PHOSPHATE 10 MG/ML IJ SOLN
INTRAMUSCULAR | Status: AC
  Filled 2023-02-12: qty 1

## 2023-02-12 MED ORDER — HYDROMORPHONE HCL 1 MG/ML IJ SOLN
0.2500 mg | INTRAMUSCULAR | Status: DC | PRN
  Administered 2023-02-12: 0.25 mg via INTRAVENOUS

## 2023-02-12 MED ORDER — LIDOCAINE HCL (CARDIAC) PF 100 MG/5ML IV SOSY
PREFILLED_SYRINGE | INTRAVENOUS | Status: DC | PRN
  Administered 2023-02-12: 100 mg via INTRAVENOUS

## 2023-02-12 MED ORDER — FENTANYL CITRATE (PF) 100 MCG/2ML IJ SOLN
INTRAMUSCULAR | Status: DC | PRN
  Administered 2023-02-12: 100 ug via INTRAVENOUS
  Administered 2023-02-12: 50 ug via INTRAVENOUS

## 2023-02-12 MED ORDER — IBUPROFEN 200 MG PO TABS: 200.0000 mg | ORAL_TABLET | Freq: Four times a day (QID) | ORAL | Status: DC | PRN

## 2023-02-12 MED ORDER — OXYCODONE HCL 5 MG PO TABS
5.0000 mg | ORAL_TABLET | Freq: Once | ORAL | Status: AC | PRN
  Administered 2023-02-12: 5 mg via ORAL

## 2023-02-12 MED ORDER — ORAL CARE MOUTH RINSE: 15.0000 mL | Freq: Once | OROMUCOSAL | Status: AC

## 2023-02-12 MED ORDER — ACETAMINOPHEN 500 MG PO TABS: 1000.0000 mg | ORAL_TABLET | Freq: Three times a day (TID) | ORAL | 0 refills | Status: AC

## 2023-02-12 MED ORDER — ONDANSETRON HCL 4 MG/2ML IJ SOLN
INTRAMUSCULAR | Status: DC | PRN
  Administered 2023-02-12: 4 mg via INTRAVENOUS

## 2023-02-12 MED ORDER — HYDROMORPHONE HCL 1 MG/ML IJ SOLN
INTRAMUSCULAR | Status: AC
  Administered 2023-02-12: 0.25 mg via INTRAVENOUS
  Filled 2023-02-12: qty 1

## 2023-02-12 MED ORDER — FENTANYL CITRATE PF 50 MCG/ML IJ SOSY: 25.0000 ug | PREFILLED_SYRINGE | INTRAMUSCULAR | Status: DC | PRN

## 2023-02-12 MED ORDER — ONDANSETRON HCL 4 MG/2ML IJ SOLN
INTRAMUSCULAR | Status: AC
  Filled 2023-02-12: qty 2

## 2023-02-12 MED ORDER — BUPIVACAINE HCL 0.25 % IJ SOLN
INTRAMUSCULAR | Status: AC
  Filled 2023-02-12: qty 1

## 2023-02-12 MED ORDER — PROPOFOL 10 MG/ML IV BOLUS
INTRAVENOUS | Status: AC
  Filled 2023-02-12: qty 20

## 2023-02-12 MED ORDER — CHLORHEXIDINE GLUCONATE 0.12 % MT SOLN
15.0000 mL | Freq: Once | OROMUCOSAL | Status: AC
  Administered 2023-02-12: 15 mL via OROMUCOSAL

## 2023-02-12 MED ORDER — ROCURONIUM BROMIDE 10 MG/ML (PF) SYRINGE
PREFILLED_SYRINGE | INTRAVENOUS | Status: AC
  Filled 2023-02-12: qty 10

## 2023-02-12 MED ORDER — GLYCOPYRROLATE 0.2 MG/ML IJ SOLN
INTRAMUSCULAR | Status: DC | PRN
  Administered 2023-02-12: .2 mg via INTRAVENOUS

## 2023-02-12 MED ORDER — OXYCODONE HCL 5 MG/5ML PO SOLN: 5.0000 mg | Freq: Once | ORAL | Status: AC | PRN

## 2023-02-12 MED ORDER — LACTATED RINGERS IV SOLN
INTRAVENOUS | Status: DC | PRN
  Administered 2023-02-12: 1000 mL

## 2023-02-12 MED ORDER — IBUPROFEN 100 MG/5ML PO SUSP: 200.0000 mg | Freq: Four times a day (QID) | ORAL | Status: DC | PRN

## 2023-02-12 SURGICAL SUPPLY — 58 items
ADH SKN CLS APL DERMABOND .7 (GAUZE/BANDAGES/DRESSINGS)
ANTIFOG SOL W/FOAM PAD STRL (MISCELLANEOUS) ×1
APL PRP STRL LF DISP 70% ISPRP (MISCELLANEOUS) ×1
APL SKNCLS STERI-STRIP NONHPOA (GAUZE/BANDAGES/DRESSINGS)
BAG COUNTER SPONGE SURGICOUNT (BAG) IMPLANT
BAG SPNG CNTER NS LX DISP (BAG)
BENZOIN TINCTURE PRP APPL 2/3 (GAUZE/BANDAGES/DRESSINGS) IMPLANT
BLADE SURG 15 STRL LF DISP TIS (BLADE) ×1 IMPLANT
BLADE SURG 15 STRL SS (BLADE) ×1
BLADE SURG SZ11 CARB STEEL (BLADE) ×1 IMPLANT
BNDG ADH 1X3 SHEER STRL LF (GAUZE/BANDAGES/DRESSINGS) ×6 IMPLANT
BNDG ADH THN 3X1 STRL LF (GAUZE/BANDAGES/DRESSINGS) ×6
CHLORAPREP W/TINT 26 (MISCELLANEOUS) ×1 IMPLANT
DERMABOND ADVANCED .7 DNX12 (GAUZE/BANDAGES/DRESSINGS) IMPLANT
DEVICE SUTURE ENDOST 10MM (ENDOMECHANICALS) IMPLANT
DISSECTOR BLUNT TIP ENDO 5MM (MISCELLANEOUS) IMPLANT
DRAPE UTILITY XL STRL (DRAPES) ×1 IMPLANT
ELECT REM PT RETURN 15FT ADLT (MISCELLANEOUS) ×1 IMPLANT
GLOVE BIO SURGEON STRL SZ7.5 (GLOVE) ×1 IMPLANT
GLOVE INDICATOR 8.0 STRL GRN (GLOVE) ×1 IMPLANT
GOWN STRL REUS W/ TWL XL LVL3 (GOWN DISPOSABLE) ×2 IMPLANT
GOWN STRL REUS W/TWL XL LVL3 (GOWN DISPOSABLE) ×2
GRASPER SUT TROCAR 14GX15 (MISCELLANEOUS) IMPLANT
IRRIG SUCT STRYKERFLOW 2 WTIP (MISCELLANEOUS)
IRRIGATION SUCT STRKRFLW 2 WTP (MISCELLANEOUS) IMPLANT
KIT BASIN OR (CUSTOM PROCEDURE TRAY) ×1 IMPLANT
KIT GASTRIC LAVAGE 34FR ADT (SET/KITS/TRAYS/PACK) IMPLANT
KIT TURNOVER KIT A (KITS) IMPLANT
L-HOOK LAP DISP 36CM (ELECTROSURGICAL) ×1
LHOOK LAP DISP 36CM (ELECTROSURGICAL) IMPLANT
MAT PREVALON FULL STRYKER (MISCELLANEOUS) ×1 IMPLANT
NDL SPNL 22GX3.5 QUINCKE BK (NEEDLE) ×1 IMPLANT
NEEDLE SPNL 22GX3.5 QUINCKE BK (NEEDLE) ×1 IMPLANT
NS IRRIG 1000ML POUR BTL (IV SOLUTION) ×1 IMPLANT
PACK UNIVERSAL I (CUSTOM PROCEDURE TRAY) ×1 IMPLANT
PENCIL SMOKE EVACUATOR (MISCELLANEOUS) IMPLANT
SET TUBE SMOKE EVAC HIGH FLOW (TUBING) ×1 IMPLANT
SHEARS HARMONIC ACE PLUS 36CM (ENDOMECHANICALS) IMPLANT
SLEEVE ADV FIXATION 5X100MM (TROCAR) IMPLANT
SLEEVE Z-THREAD 5X100MM (TROCAR) ×3 IMPLANT
SOLUTION ANTFG W/FOAM PAD STRL (MISCELLANEOUS) ×1 IMPLANT
SPIKE FLUID TRANSFER (MISCELLANEOUS) ×1 IMPLANT
STRIP CLOSURE SKIN 1/2X4 (GAUZE/BANDAGES/DRESSINGS) IMPLANT
SUT MNCRL AB 4-0 PS2 18 (SUTURE) ×1 IMPLANT
SUT SILK 0 (SUTURE) ×1
SUT SILK 0 30XBRD TIE 6 (SUTURE) ×1 IMPLANT
SUT VIC AB 2-0 SH 27 (SUTURE) ×1
SUT VIC AB 2-0 SH 27X BRD (SUTURE) ×1 IMPLANT
SUT VICRYL 0 TIES 12 18 (SUTURE) IMPLANT
SUT VICRYL 0 UR6 27IN ABS (SUTURE) IMPLANT
SYR 20ML LL LF (SYRINGE) ×1 IMPLANT
SYS KII OPTICAL ACCESS 15MM (TROCAR) ×1
SYSTEM KII OPTICAL ACCESS 15MM (TROCAR) IMPLANT
TOWEL OR 17X26 10 PK STRL BLUE (TOWEL DISPOSABLE) ×1 IMPLANT
TOWEL OR NON WOVEN STRL DISP B (DISPOSABLE) ×1 IMPLANT
TROCAR ADV FIXATION 5X100MM (TROCAR) IMPLANT
TROCAR BALLN 12MMX100 BLUNT (TROCAR) IMPLANT
TROCAR Z-THREAD OPTICAL 5X100M (TROCAR) ×1 IMPLANT

## 2023-02-12 NOTE — Transfer of Care (Signed)
Immediate Anesthesia Transfer of Care Note  Patient: [name]  Procedure(s) Performed: LAPAROSCOPIC REMOVAL OF ADJUSTABLE GASTRIC BAND AND COMPONENTS  Patient Location: PACU  Anesthesia Type:General  Level of Consciousness: awake, alert , oriented, and patient cooperative  Airway & Oxygen Therapy: Patient Spontanous Breathing and Patient connected to face mask oxygen  Post-op Assessment: Report given to RN, Post -op Vital signs reviewed and stable, and Patient moving all extremities X 4  Post vital signs: Reviewed and stable  Last Vitals:  Vitals Value Taken Time  BP 163/76 02/12/23 1112  Temp    Pulse 59 02/12/23 1113  Resp 16 02/12/23 1113  SpO2 100 % 02/12/23 1113  Vitals shown include unvalidated device data.  Last Pain:  Vitals:   02/12/23 0751  TempSrc:   PainSc: 5          Complications: No notable events documented.

## 2023-02-12 NOTE — H&P (Signed)
CC: here for lap band removal  Requesting provider: n/a  HPI: [name] is an 77 y.o. female who is here for lap band removal due to persistent abnormal esophageal issues on upper gi even with fluid removed. Pt denies medical changes since seen  Old hpi: Patient underwent laparoscopic placement of adjustable gastric band (AP standard (on December 10, 2008 with Dr. Ezzard Standing. She also had a small sliding hiatal hernia repair at the same time. Preoperative weight was 264 pounds. We last saw her on July 08, 2018 when she weighed 197 pounds. States since she was last seen she has been diagnosed with an alpha gal antibody issue and has to avoid all products derived from any type of mammal. She has alternating bouts of constipation and cramping and bloating and loose stools. She started to have trouble with swallowing about 2 to 3 months ago. She described as a gurgling sensation and bad acid reflux. She saw her PCP who placed her on an acids which she said worsened it. She saw Dr. Loreta Ave who ordered an esophagram which showed esophageal dysmotility and a tight Lap-Band. She is eating more slippery foods and liquid foods to avoid symptoms.   Past Medical History:  Diagnosis Date   Acne    Acute UTI 01/23/2014   to start ABX 01/24/2014   Allergy    Anxiety    Arthritis    feet, knee and base of thumbs   Asthma    controlled   Breast cancer (HCC) 2012   Right Breast Cancer   Breast cancer, stage 1 (HCC) 07/18/2011   Carpal tunnel syndrome    right   Dehydration 10/21/2011   Depression    Fibromyalgia    Hepatitis 1976   hepatitis B   History of hiatal hernia    Hyperlipidemia    Hypertension    Mononucleosis 1952   Multiple allergies    Nephritis 1955   Osteopenia    Personal history of chemotherapy 2012   Right Breast Cancer   PONV (postoperative nausea and vomiting)    has had to use scop patch    Past Surgical History:  Procedure Laterality Date   ABDOMINAL HYSTERECTOMY   09/15/1979   BREAST CAPSULECTOMY WITH IMPLANT EXCHANGE Right 03/02/2013   Procedure: BREAST CAPSULECTOMY WITH REPOSITIONING THE IMPLANT;  Surgeon: Wayland Denis, DO;  Location: [facility];  Service: Plastics;  Laterality: Right;   BREAST SURGERY     BUNIONECTOMY  09/14/1996   left foot   CATARACT EXTRACTION Left    ETHMOIDECTOMY  09/14/1989   EYE SURGERY     facial plastic surgery  09/14/1994   JOINT REPLACEMENT Left 09/15/2003   KNEE ARTHROPLASTY Right 01/29/2014   Procedure: COMPUTER ASSISTED TOTAL KNEE ARTHROPLASTY;  Surgeon: Eldred Manges, MD;  [facility];  Service: Orthopedics;  Laterality: Right;  Right Total Knee Arthroplasty   KNEE ARTHROSCOPY  1996, 2001   left   lap band surgery  09/14/2008   LIPOSUCTION Bilateral 03/02/2013   Procedure: LIPOSUCTION;  Surgeon: Wayland Denis, DO;  Location: [facility];  Service: Plastics;  Laterality: Bilateral;   MASTECTOMY Right 09/14/2010   w/chemo   MASTOPEXY  04/13/2012   Procedure: MASTOPEXY;  Surgeon: Wayland Denis, DO;  Location: [facility];  Service: Plastics;  Laterality: Left;   left breast  mastopexy reduction for symmetry   NASAL SINUS SURGERY  09/15/1999   port-a-cath placement  06/26/2011   tip in lower SVC per  chest x-ray read by Dr. Dwyane Dee   [facility] REMOVAL Left 03/02/2013   Procedure: REMOVAL PORT-A-CATH;  Surgeon: [name], MD;  Location: [facility];  Service: General;  Laterality: Left;   right breast lumpectomy  09/14/1984   right ear surgery  09/14/1981   tumor removed (?)   right mastectomy  06/26/2011   TONSILLECTOMY  09/14/1948   TOTAL KNEE ARTHROPLASTY  09/14/2004   left   TUBAL LIGATION  09/14/1976    Family History  Problem Relation Age of Onset   Breast cancer Paternal Aunt    Breast cancer Paternal Grandmother    Stroke Other    Hypertension Other    Heart disease Other     Social:  reports that she has never smoked.  She has never used smokeless tobacco. She reports that she does not drink alcohol and does not use drugs.  Allergies:  Allergies  Allergen Reactions   Alpha-Gal Anaphylaxis   Demerol [Meperidine] Nausea And Vomiting   Morphine And Codeine Nausea And Vomiting    PCA pump    Medications: I have reviewed the patient's current medications.   ROS - all of the below systems have been reviewed with the patient and positives are indicated with bold text General: chills, fever or night sweats Eyes: blurry vision or double vision ENT: epistaxis or sore throat Allergy/Immunology: itchy/watery eyes or nasal congestion Hematologic/Lymphatic: bleeding problems, blood clots or swollen lymph nodes Endocrine: temperature intolerance or unexpected weight changes Breast: new or changing breast lumps or nipple discharge Resp: cough, shortness of breath, or wheezing CV: chest pain or dyspnea on exertion GI: as per HPI GU: dysuria, trouble voiding, or hematuria MSK: joint pain or joint stiffness Neuro: TIA or stroke symptoms Derm: pruritus and skin lesion changes Psych: anxiety and depression  PE Blood pressure (!) 162/66, pulse 62, temperature (!) 97.3 F (36.3 C), temperature source Oral, resp. rate 16, height 5\' 4"  (1.626 m), weight 99.8 kg, SpO2 96 %. Constitutional: NAD; conversant; no deformities Eyes: Moist conjunctiva; no lid lag; anicteric; PERRL Neck: Trachea midline; no thyromegaly Lungs: Normal respiratory effort; no tactile fremitus CV: RRR; no palpable thrills; no pitting edema GI: Abd soft, nt, nd, old port; no palpable hepatosplenomegaly MSK: Normal gait; no clubbing/cyanosis Psychiatric: Appropriate affect; alert and oriented x3 Lymphatic: No palpable cervical or axillary lymphadenopathy Skin:no rash/lesions  No results found for this or any previous visit (from the past 48 hour(s)).  No results found.  Imaging: reviewed  A/P: [name] is an 77 y.o. female with  h/o lap adjustable gastric band with esophageal motility abormalities  To OR for lap removal of lap band and components Iv abx Alpha-gal ab  Nicholette Sella. Andrey Campanile, MD, FACS General, Bariatric, & Minimally Invasive Surgery [facility]

## 2023-02-12 NOTE — Discharge Instructions (Signed)

## 2023-02-12 NOTE — Anesthesia Procedure Notes (Signed)
Procedure Name: Intubation Date/Time: 02/12/2023 9:38 AM  Performed by: Shanon Payor, CRNAPre-anesthesia Checklist: Patient identified, Emergency Drugs available, Suction available, Patient being monitored and Timeout performed Patient Re-evaluated:Patient Re-evaluated prior to induction Oxygen Delivery Method: Circle system utilized Preoxygenation: Pre-oxygenation with 100% oxygen Induction Type: IV induction Ventilation: Two handed mask ventilation required Laryngoscope Size: Mac and 3 Grade View: Grade II Tube type: Oral Tube size: 7.0 mm Number of attempts: 1 Airway Equipment and Method: Stylet Placement Confirmation: ETT inserted through vocal cords under direct vision, positive ETCO2, CO2 detector and breath sounds checked- equal and bilateral Secured at: 23 cm Tube secured with: Tape Dental Injury: Teeth and Oropharynx as per pre-operative assessment

## 2023-02-12 NOTE — Op Note (Signed)
02/12/2023  11:06 AM  PATIENT:  [name]  77 y.o. female  PRE-OPERATIVE DIAGNOSIS:  ESOPHAGEAL DYSMOTILITY; h/o laparoscopic adjustable gastric band placement with sliding hiatal hernia repair December 10, 2008  POST-OPERATIVE DIAGNOSIS:  ESOPHAGEAL DYSMOTILITY  PROCEDURE:  Procedure(s): LAPAROSCOPIC REMOVAL OF ADJUSTABLE GASTRIC BAND AND COMPONENTS; laparoscopic bilateral tap block  SURGEON:  Surgeon(s): Gaynelle Adu, MD   ASSISTANTS: none   ANESTHESIA:   general  DRAINS: none   LOCAL MEDICATIONS USED:  MARCAINE     SPECIMEN:  Source of Specimen:  Lap band and components  DISPOSITION OF SPECIMEN:   Discarded YES COUNTS:  YES  EBL: Minimal  INDICATION FOR PROCEDURE: 77 year old female who has a remote history of laparoscopic placement of a AP standard adjustable gastric band with sliding hiatal hernia repair March 2010 with Dr. Ezzard Standing came in for long-term follow-up because of difficulty swallowing for the past several months.  She also had a gurgling sensation and bad acid reflux.  Her gastroenterologist ordered an esophagram which showed esophageal dysmotility and a tight Lap-Band.  We removed all the fluid from her band and then had a follow-up esophagram about 6 weeks later which still showed persistent dysmotility and the barium tablet getting transiently stuck so at that point I recommended removal of her band.  Please see the outside records for additional information  PROCEDURE: After obtaining informed consent the patient was taken to operating room for at [facility] long hospital placed supine on the operating room table.  General endotracheal anesthesia was established.  Sequential compression devices were placed.  Her abdomen was prepped and draped in the usual standard surgical fashion with ChloraPrep.  IV antibiotic was administered.  Surgical timeout was performed.  Optiview technique was used to gain access to the abdomen.  I used one of her old trocar scars from the  left abdominal wall just below the left subcostal margin.  Using a 0 degree 5 mm laparoscope through a 5 mm trocar I advanced it under direct visualization and carefully entered the abdominal cavity.  Pneumoperitoneum was smoothly established.  The laparoscope was advanced abdominal cavity was surveilled.  Patient had dilated air-filled small bowel along with a very air-filled stomach.  Patient was placed in reverse Trendelenburg.  A 5 mm trocar was placed above into the left of the umbilicus, a 5 mm trocar in the right lateral abdominal wall and a 15 mm trocar in the right mid abdomen very close to her port.  A Nathanson liver retractor was placed through the subxiphoid trocar to lift up the left lobe of the liver.  I then performed a bilateral laparoscopic tap block for postoperative pain relief.  Patient had some omentum adhered to the band tubing around the stomach this was taken down with harmonic scalpel.  Identified the Lap-Band.  It appeared to be in proper orientation without any overt evidence of slip.  Using hook electrocautery I incised the capsule over the anterior portion of the band and identified the buckle of the band.  I was able to unbuckled the band.  I then cut the band tubing and released some additional capsule over the band with hook electrocautery.  I was then able to remove the band from around the stomach and brought it out through the 15 mm trocar.  1 piece did come apart from the band but I was able to retrieve it and extracted in its entirety.  I did release some of this cicatrix ensuring that we were not violating the  stomach wall.  I also took down the 2 imbrication sutures that were still intact with EndoShears without electrocautery.  At this point anesthesia was able to an orogastric tube down and get into the stomach and decompressed the stomach.  The cicatrix had been released for about 180 degrees.  There is no evidence of gastric injury.  I then removed the 15 mm trocar and  closed the fascial defect with a single interrupted 0 Vicryl using PMI suture passer and infiltrated some local in this area.  I then extended the 15 mm trocar incision laterally and then extracted the port with electrocautery including the small piece of mesh that had been used to affix to the back of the port.  The port and the remaining tubing were removed.  The band and all its components had been removed.  The laparoscope was reinserted and we inspected the abdomen again.  No evidence of bleeding.  No air leak.  Nothing trapped within the fascial closure.  Pneumoperitoneum was released.  The 15 mm trocar was closed with a 2-0 Vicryl and the subcutaneous tissue.  Then all incisions were closed with a 4 Monocryl in a subcuticular fashion followed by the application of Dermabond.  All needle, instrument, and sponge counts were correct x 2.  There is no evidence of injury to surrounding structures.  PLAN OF CARE: Discharge to home after PACU  PATIENT DISPOSITION:  PACU - hemodynamically stable.   Delay start of Pharmacological VTE agent (>24hrs) due to surgical blood loss or risk of bleeding:  not applicable  Helen Sella. Andrey Campanile, MD, FACS General, Bariatric, & Minimally Invasive Surgery [facility], [place]

## 2023-02-13 ENCOUNTER — Other Ambulatory Visit: Payer: Self-pay

## 2023-02-15 ENCOUNTER — Other Ambulatory Visit: Payer: Self-pay | Admitting: Internal Medicine

## 2023-02-15 DIAGNOSIS — M8589 Other specified disorders of bone density and structure, multiple sites: Secondary | ICD-10-CM

## 2023-02-15 NOTE — Anesthesia Postprocedure Evaluation (Signed)
Anesthesia Post Note  Patient: [name]  Procedure(s) Performed: LAPAROSCOPIC REMOVAL OF ADJUSTABLE GASTRIC BAND AND COMPONENTS     Patient location during evaluation: PACU Anesthesia Type: General Level of consciousness: awake Pain management: pain level controlled Vital Signs Assessment: post-procedure vital signs reviewed and stable Respiratory status: spontaneous breathing Cardiovascular status: stable Postop Assessment: no apparent nausea or vomiting Anesthetic complications: no  No notable events documented.  Last Vitals:  Vitals:   02/12/23 1230 02/12/23 1251  BP: (!) 160/75 (!) 140/69  Pulse: (!) 58 (!) 50  Resp: 13 14  Temp:  36.4 C  SpO2: 93% 96%    Last Pain:  Vitals:   02/12/23 1251  TempSrc: Oral  PainSc: 0-No pain                 Caren Macadam

## 2023-02-23 DIAGNOSIS — H40013 Open angle with borderline findings, low risk, bilateral: Secondary | ICD-10-CM | POA: Diagnosis not present

## 2023-02-23 DIAGNOSIS — H2511 Age-related nuclear cataract, right eye: Secondary | ICD-10-CM | POA: Diagnosis not present

## 2023-03-01 ENCOUNTER — Ambulatory Visit: Payer: Medicare PPO

## 2023-03-02 DIAGNOSIS — L814 Other melanin hyperpigmentation: Secondary | ICD-10-CM | POA: Diagnosis not present

## 2023-03-02 DIAGNOSIS — L578 Other skin changes due to chronic exposure to nonionizing radiation: Secondary | ICD-10-CM | POA: Diagnosis not present

## 2023-03-02 DIAGNOSIS — D229 Melanocytic nevi, unspecified: Secondary | ICD-10-CM | POA: Diagnosis not present

## 2023-03-02 DIAGNOSIS — D1801 Hemangioma of skin and subcutaneous tissue: Secondary | ICD-10-CM | POA: Diagnosis not present

## 2023-03-02 DIAGNOSIS — L821 Other seborrheic keratosis: Secondary | ICD-10-CM | POA: Diagnosis not present

## 2023-03-02 DIAGNOSIS — L239 Allergic contact dermatitis, unspecified cause: Secondary | ICD-10-CM | POA: Diagnosis not present

## 2023-03-10 ENCOUNTER — Encounter (HOSPITAL_COMMUNITY): Payer: Self-pay | Admitting: Internal Medicine

## 2023-03-10 ENCOUNTER — Observation Stay (HOSPITAL_COMMUNITY): Payer: Medicare PPO

## 2023-03-10 ENCOUNTER — Other Ambulatory Visit: Payer: Self-pay

## 2023-03-10 ENCOUNTER — Emergency Department (HOSPITAL_COMMUNITY): Payer: Medicare PPO

## 2023-03-10 ENCOUNTER — Observation Stay (HOSPITAL_COMMUNITY)
Admission: EM | Admit: 2023-03-10 | Discharge: 2023-03-12 | Disposition: A | Discharge status: Home / Self Care | Payer: Medicare PPO | Attending: Internal Medicine | Admitting: Internal Medicine

## 2023-03-10 DIAGNOSIS — S92415A Nondisplaced fracture of proximal phalanx of left great toe, initial encounter for closed fracture: Secondary | ICD-10-CM | POA: Diagnosis not present

## 2023-03-10 DIAGNOSIS — S92901A Unspecified fracture of right foot, initial encounter for closed fracture: Secondary | ICD-10-CM | POA: Diagnosis present

## 2023-03-10 DIAGNOSIS — I10 Essential (primary) hypertension: Secondary | ICD-10-CM | POA: Diagnosis present

## 2023-03-10 DIAGNOSIS — S0083XA Contusion of other part of head, initial encounter: Secondary | ICD-10-CM | POA: Insufficient documentation

## 2023-03-10 DIAGNOSIS — R55 Syncope and collapse: Principal | ICD-10-CM | POA: Diagnosis present

## 2023-03-10 DIAGNOSIS — Y92009 Unspecified place in unspecified non-institutional (private) residence as the place of occurrence of the external cause: Secondary | ICD-10-CM

## 2023-03-10 DIAGNOSIS — S0993XA Unspecified injury of face, initial encounter: Secondary | ICD-10-CM | POA: Diagnosis not present

## 2023-03-10 DIAGNOSIS — W19XXXA Unspecified fall, initial encounter: Secondary | ICD-10-CM | POA: Diagnosis not present

## 2023-03-10 DIAGNOSIS — Z79899 Other long term (current) drug therapy: Secondary | ICD-10-CM | POA: Diagnosis not present

## 2023-03-10 DIAGNOSIS — R197 Diarrhea, unspecified: Secondary | ICD-10-CM | POA: Insufficient documentation

## 2023-03-10 DIAGNOSIS — Z853 Personal history of malignant neoplasm of breast: Secondary | ICD-10-CM | POA: Insufficient documentation

## 2023-03-10 DIAGNOSIS — E876 Hypokalemia: Secondary | ICD-10-CM | POA: Diagnosis present

## 2023-03-10 DIAGNOSIS — E860 Dehydration: Secondary | ICD-10-CM | POA: Diagnosis present

## 2023-03-10 DIAGNOSIS — N393 Stress incontinence (female) (male): Secondary | ICD-10-CM | POA: Diagnosis present

## 2023-03-10 DIAGNOSIS — R609 Edema, unspecified: Secondary | ICD-10-CM | POA: Diagnosis not present

## 2023-03-10 DIAGNOSIS — S92812A Other fracture of left foot, initial encounter for closed fracture: Secondary | ICD-10-CM | POA: Diagnosis present

## 2023-03-10 DIAGNOSIS — S3993XA Unspecified injury of pelvis, initial encounter: Secondary | ICD-10-CM | POA: Diagnosis not present

## 2023-03-10 DIAGNOSIS — E785 Hyperlipidemia, unspecified: Secondary | ICD-10-CM | POA: Diagnosis present

## 2023-03-10 DIAGNOSIS — W1811XA Fall from or off toilet without subsequent striking against object, initial encounter: Secondary | ICD-10-CM | POA: Diagnosis not present

## 2023-03-10 DIAGNOSIS — J301 Allergic rhinitis due to pollen: Secondary | ICD-10-CM | POA: Diagnosis not present

## 2023-03-10 DIAGNOSIS — S199XXA Unspecified injury of neck, initial encounter: Secondary | ICD-10-CM | POA: Diagnosis not present

## 2023-03-10 DIAGNOSIS — N19 Unspecified kidney failure: Secondary | ICD-10-CM | POA: Diagnosis not present

## 2023-03-10 DIAGNOSIS — S92324A Nondisplaced fracture of second metatarsal bone, right foot, initial encounter for closed fracture: Secondary | ICD-10-CM | POA: Diagnosis not present

## 2023-03-10 DIAGNOSIS — E782 Mixed hyperlipidemia: Secondary | ICD-10-CM | POA: Diagnosis not present

## 2023-03-10 DIAGNOSIS — J4520 Mild intermittent asthma, uncomplicated: Secondary | ICD-10-CM | POA: Diagnosis not present

## 2023-03-10 DIAGNOSIS — M85872 Other specified disorders of bone density and structure, left ankle and foot: Secondary | ICD-10-CM | POA: Diagnosis not present

## 2023-03-10 DIAGNOSIS — Z96653 Presence of artificial knee joint, bilateral: Secondary | ICD-10-CM | POA: Insufficient documentation

## 2023-03-10 DIAGNOSIS — I700 Atherosclerosis of aorta: Secondary | ICD-10-CM | POA: Diagnosis not present

## 2023-03-10 DIAGNOSIS — S92414A Nondisplaced fracture of proximal phalanx of right great toe, initial encounter for closed fracture: Secondary | ICD-10-CM

## 2023-03-10 DIAGNOSIS — R32 Unspecified urinary incontinence: Secondary | ICD-10-CM

## 2023-03-10 DIAGNOSIS — S92411A Displaced fracture of proximal phalanx of right great toe, initial encounter for closed fracture: Secondary | ICD-10-CM | POA: Diagnosis not present

## 2023-03-10 DIAGNOSIS — J309 Allergic rhinitis, unspecified: Secondary | ICD-10-CM | POA: Diagnosis present

## 2023-03-10 DIAGNOSIS — S99922A Unspecified injury of left foot, initial encounter: Secondary | ICD-10-CM | POA: Diagnosis not present

## 2023-03-10 DIAGNOSIS — S069X9A Unspecified intracranial injury with loss of consciousness of unspecified duration, initial encounter: Secondary | ICD-10-CM | POA: Diagnosis not present

## 2023-03-10 LAB — URINALYSIS, ROUTINE W REFLEX MICROSCOPIC
Bacteria, UA: NONE SEEN
Bilirubin Urine: NEGATIVE
Glucose, UA: NEGATIVE mg/dL
Ketones, ur: NEGATIVE mg/dL
Nitrite: NEGATIVE
Protein, ur: NEGATIVE mg/dL
Specific Gravity, Urine: 1.006 (ref 1.005–1.030)
pH: 8 (ref 5.0–8.0)

## 2023-03-10 LAB — CBG MONITORING, ED: Glucose-Capillary: 114 mg/dL — ABNORMAL HIGH (ref 70–99)

## 2023-03-10 LAB — CBC
HCT: 36.6 % (ref 36.0–46.0)
Hemoglobin: 11.9 g/dL — ABNORMAL LOW (ref 12.0–15.0)
MCH: 29.8 pg (ref 26.0–34.0)
MCHC: 32.5 g/dL (ref 30.0–36.0)
MCV: 91.5 fL (ref 80.0–100.0)
Platelets: 169 10*3/uL (ref 150–400)
RBC: 4 MIL/uL (ref 3.87–5.11)
RDW: 13.2 % (ref 11.5–15.5)
WBC: 10.1 10*3/uL (ref 4.0–10.5)
nRBC: 0 % (ref 0.0–0.2)

## 2023-03-10 LAB — BASIC METABOLIC PANEL
Anion gap: 8 (ref 5–15)
BUN: 18 mg/dL (ref 8–23)
CO2: 27 mmol/L (ref 22–32)
Calcium: 8.8 mg/dL — ABNORMAL LOW (ref 8.9–10.3)
Chloride: 103 mmol/L (ref 98–111)
Creatinine, Ser: 0.65 mg/dL (ref 0.44–1.00)
GFR, Estimated: >60 mL/min (ref >60)
Glucose, Bld: 116 mg/dL — ABNORMAL HIGH (ref 70–99)
Potassium: 3.4 mmol/L — ABNORMAL LOW (ref 3.5–5.1)
Sodium: 138 mmol/L (ref 135–145)

## 2023-03-10 LAB — MAGNESIUM: Magnesium: 1.5 mg/dL — ABNORMAL LOW (ref 1.7–2.4)

## 2023-03-10 MED ORDER — DIPHENHYDRAMINE HCL 50 MG/ML IJ SOLN
50.0000 mg | INTRAMUSCULAR | Status: DC | PRN
  Filled 2023-03-10: qty 1

## 2023-03-10 MED ORDER — ONDANSETRON HCL 4 MG/2ML IJ SOLN: 4.0000 mg | Freq: Four times a day (QID) | INTRAMUSCULAR | Status: DC | PRN

## 2023-03-10 MED ORDER — MELATONIN 3 MG PO TABS
3.0000 mg | ORAL_TABLET | Freq: Every evening | ORAL | Status: DC | PRN
  Administered 2023-03-11: 3 mg via ORAL
  Filled 2023-03-10: qty 1

## 2023-03-10 MED ORDER — ALBUTEROL SULFATE (2.5 MG/3ML) 0.083% IN NEBU: 2.5000 mg | INHALATION_SOLUTION | RESPIRATORY_TRACT | Status: DC | PRN

## 2023-03-10 MED ORDER — MIRABEGRON ER 25 MG PO TB24
50.0000 mg | ORAL_TABLET | Freq: Every day | ORAL | Status: DC
  Administered 2023-03-10 – 2023-03-11 (×2): 50 mg via ORAL
  Filled 2023-03-10 (×3): qty 2

## 2023-03-10 MED ORDER — LACTATED RINGERS IV BOLUS
1000.0000 mL | Freq: Once | INTRAVENOUS | Status: AC
  Administered 2023-03-10: 1000 mL via INTRAVENOUS

## 2023-03-10 MED ORDER — ACETAMINOPHEN 650 MG RE SUPP: 650.0000 mg | Freq: Four times a day (QID) | RECTAL | Status: DC | PRN

## 2023-03-10 MED ORDER — VARENICLINE TARTRATE 0.03 MG/ACT NA SOLN: 1.0000 | Freq: Every day | NASAL | Status: DC

## 2023-03-10 MED ORDER — NALOXONE HCL 0.4 MG/ML IJ SOLN: 0.4000 mg | INTRAMUSCULAR | Status: DC | PRN

## 2023-03-10 MED ORDER — FENTANYL CITRATE PF 50 MCG/ML IJ SOSY
50.0000 ug | PREFILLED_SYRINGE | Freq: Once | INTRAMUSCULAR | Status: AC
  Administered 2023-03-10: 50 ug via INTRAVENOUS
  Filled 2023-03-10: qty 1

## 2023-03-10 MED ORDER — POTASSIUM CHLORIDE CRYS ER 20 MEQ PO TBCR
40.0000 meq | EXTENDED_RELEASE_TABLET | Freq: Once | ORAL | Status: AC
  Administered 2023-03-10: 40 meq via ORAL
  Filled 2023-03-10: qty 2

## 2023-03-10 MED ORDER — FENTANYL CITRATE PF 50 MCG/ML IJ SOSY
50.0000 ug | PREFILLED_SYRINGE | INTRAMUSCULAR | Status: DC | PRN
  Administered 2023-03-10 – 2023-03-11 (×3): 50 ug via INTRAVENOUS
  Filled 2023-03-10 (×3): qty 1

## 2023-03-10 MED ORDER — LACTATED RINGERS IV SOLN: INTRAVENOUS | Status: DC

## 2023-03-10 MED ORDER — ROSUVASTATIN CALCIUM 5 MG PO TABS
5.0000 mg | ORAL_TABLET | Freq: Every day | ORAL | Status: DC
  Administered 2023-03-12: 5 mg via ORAL
  Filled 2023-03-10 (×2): qty 1

## 2023-03-10 MED ORDER — BUDESONIDE 0.25 MG/2ML IN SUSP
0.2500 mg | Freq: Two times a day (BID) | RESPIRATORY_TRACT | Status: DC
  Administered 2023-03-11: 0.25 mg via RESPIRATORY_TRACT
  Filled 2023-03-10: qty 2

## 2023-03-10 MED ORDER — FLUTICASONE PROPIONATE HFA 110 MCG/ACT IN AERO: 1.0000 | INHALATION_SPRAY | Freq: Every day | RESPIRATORY_TRACT | Status: DC

## 2023-03-10 MED ORDER — ACETAMINOPHEN 325 MG PO TABS
650.0000 mg | ORAL_TABLET | Freq: Four times a day (QID) | ORAL | Status: DC | PRN
  Administered 2023-03-12 (×2): 650 mg via ORAL
  Filled 2023-03-10 (×2): qty 2

## 2023-03-10 MED ORDER — LORATADINE 10 MG PO TABS
10.0000 mg | ORAL_TABLET | Freq: Every day | ORAL | Status: DC
  Filled 2023-03-10: qty 1

## 2023-03-10 MED ORDER — KETOROLAC TROMETHAMINE 15 MG/ML IJ SOLN
15.0000 mg | Freq: Once | INTRAMUSCULAR | Status: AC
  Administered 2023-03-10: 15 mg via INTRAVENOUS
  Filled 2023-03-10: qty 1

## 2023-03-10 NOTE — Care Management (Signed)
Transition of Care [facility]) - Emergency Department Mini Assessment   Patient Details  Name: [name] MRN: [id number] Date of Birth: April 09, 1946  Transition of Care [facility]) CM/SW Contact:    Lavenia Atlas, RN Phone Number: 03/10/2023, 6:35 PM   Clinical Narrative: Received [facility] consult for home health services. This RNCM spoke with patient at bedside who reports she has Alpha Gal syndrome and now has a allergic reaction to  [facility] products as well as non-mammal products. Patient reports she lives alone however her daughter is at her home and can stay with her outside of her work hours 9am-5pm. This RNCM offered [facility] choice patient will accept any HH agency that will accept her insurance. Patient is request ambulance transportation at discharge due to having steps to get in her home and DME: rollator walker. This RNCM discussed potential for SNF placement however patient reports she would prefer going home w/HH services. This RNCM will notify EDP of need for DME & HH orders (HHPT/OT, SW).  TOC will continue to follow for needs.    ED Mini Assessment: What brought you to the Emergency Department? : Patient reports she has Alpha Gal and reports she had a fall at home  Barriers to Discharge: Continued Medical Work up  [employer] interventions: coordinating home health services  Means of departure: Ambulance  Interventions which prevented an admission or readmission: Home Health Consult or Services    Patient Contact and Communications        ,          Patient states their goals for this hospitalization and ongoing recovery are:: return home with HH/DME services CMS Medicare.gov Compare Post Acute Care list provided to:: Patient Choice offered to / list presented to : Patient  Admission diagnosis:  fall Patient Active Problem List   Diagnosis Date Noted   Osteoarthritis of right knee 01/29/2014    Class: Diagnosis of   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

## 2023-03-10 NOTE — ED Notes (Signed)
Walker > dc   Richardson Dopp 03/10/23 2011

## 2023-03-10 NOTE — Progress Notes (Addendum)
Addend: 8:10 CSW was trying to get DME set up at this time there has been no response.     CSW has set up home health with Tresa Endo from [facility]. This CSW also has reached out to [employer] to see about a walker. AT this time provider may feel patient needs to stay for pain control. TOC will continue to follow

## 2023-03-10 NOTE — ED Triage Notes (Signed)
Pt BIBA from home. Pt had LOC last night at 10p, has orbital swelling and bruising, and both big toes are bruised. Pt c/o facial, and foot pain as well as diarrhea for 2x days.  Pt has Alpha Gal  AOx4

## 2023-03-10 NOTE — Progress Notes (Signed)
Orthopedic Tech Progress Note Patient Details:  [name] 1946-01-05 [id number]  Ortho Devices Type of Ortho Device: Postop shoe/boot Ortho Device/Splint Location: bi lat post op shoe applied Ortho Device/Splint Interventions: Ordered, Application, Adjustment   Post Interventions Patient Tolerated: Well Instructions Provided: Adjustment of device, Care of device  [name] 03/10/2023, 6:39 PM

## 2023-03-10 NOTE — H&P (Signed)
History and Physical      [name] XBJ:[id number] DOB: 05/23/1946 DOA: 03/10/2023; DOS: 03/10/2023  PCP: Thana Ates, MD  Patient coming from: home   I have personally briefly reviewed patient's old medical records in [facility]  Chief Complaint: syncope  HPI: [name] is a 77 y.o. female with medical history significant for chronic diarrhea, essential pretension, hyperlipidemia, allergic rhinitis, who is admitted to [facility] on 03/10/2023 with syncope after presenting from home to [facility] ED complaining of syncope.   Patient reports a history of chronic diarrhea, and notes a slight increase in the frequency of her daily such episodes of loose stool over the last 2 to 3 days.  Not associate any melena or hematochezia.  No assist with any abdominal discomfort.  She has not noted any associated nausea or vomiting, and also conveys no significant compensatory increase in water consumption over the last few days to offset the relative increase in fecal output.   In this context, she reports that she went outside last evening, sitting in a lawn chair on her porch, noting ambient temperatures and the 90s at the time.  She remained in this position for slightly less than an hour, at which time she attempted to stand up.  At that point, she developed dizziness, lightheadedness, and the subjective sensation of impending loss consciousness, before actually losing consciousness, and falling forward onto the cement of her patio, striking the left portion of her face in the process, suspected to represent the principal point of contact with the patio.  This episode was witnessed by her daughter, who conveyed that the patient remained unconscious for only few seconds before regaining consciousness, and noted that the patient did not exhibit any evidence of confusion upon regaining consciousness.  As it relates to syncope, the patient denies any recent, immediately preceding, or  ensuing chest pain, sob, palpitations, diaphoresis, dizziness.  Denies any prior history of syncopal event.    This episode was not associated with any generalized tonic-clonic activity, nor associate with any tongue biting or loss of bowel/bladder function.  The patient denies any associated acute focal weakness, acute focal numbness, paresthesias, facial droop, slurred speech, expressive aphasia, acute change in vision, dysphagia, vertigo.    As a consequence of striking her forehead is clear this fall, she reports some mild residual pain over her forehead.denies any new onset neck pain, nor any new onset hip discomfort.  As a consequence of the above all, she also notes new onset pain associated with her bilateral first toes as well as the right second toe.  She notes that the pain in her bilateral feet is rendering it more difficult to ambulate on the basis of suboptimal pain control relative to her baseline ambulatory status, which she lives at home with her daughter, and is able to ambulate without any assistance or exogenous aids.   Medical history notable for essential hypertension, for which the patient is on lisinopril as well as HCTZ.  Not on any blood thinners as an outpatient.  Per chart review, most recent echocardiogram occurred in May 2014, and was notable for LVEF 60%, no focal motion MIs, mild LVH, and no evidence of significant valvular pathology.     ED Course:  Vital signs in the ED were notable for the following: Afebrile; heart rates in the 60s; supple pressures in the low 100s to 130s; respiratory rate 14-18, oxygen saturation 95 to 97% on room air.  Labs were notable  for the following: BMP was notable for the following: Sodium 130, potassium 3.4, creatinine 0.65, BUN/creatinine ratio 27, glucose 116.  CBC notable for white blood cell count 2900, hemoglobin 11.9 compared to most recent prior hemoglobin data point of 13.1 on 02/10/2023.  Urinalysis showed 6-10 white blood  cells, nitrate negative, and no evidence of bacteria.  Per my interpretation, EKG in ED demonstrated the following: Sinus rhythm with heart rate 67, normal intervals, and no evidence of T wave or ST changes, including no evidence of ST elevation.  Imaging, per formal radiology reads, were notable for the following: Noncontrast CT head showed no evidence of acute intracranial hemorrhage or any evidence of acute infarct.  CT cervical spine showed no evidence of cervical spine fracture or subluxation injury.  CT maxillofacial showed no evidence of acute facial fracture, will demonstrating superficial soft tissue swelling of the periorbital soft tissues and forehead region.  Plain films of the pelvis showed no evidence of acute fracture.  Plain films of the left foot showed nondisplaced fractures of the base of the proximal phalanx of the left big toe.  Plain films of the right foot showed oblique mild commuted fracture of the right proximal first phalanx with extension into the MTP joint as well as suspected angulated fracture of the head of the second metatarsal bone.  While in the ED, the following were administered: Fentanyl 50 mcg IV x 2 doses, Toradol 15 mg IV x 1 dose.  Lactated Ringer's times other bolus.  Subsequently, the patient was admitted for further evaluation management of presenting syncopal episode resulting in fall as well as fractures of the bilateral great toes as well as fracture of the right second metatarsal, with presenting labs notable for hypokalemia and clinical evidence of dehydration.    Review of Systems: As per HPI otherwise 10 point review of systems negative.   Past Medical History:  Diagnosis Date   Acne    Acute UTI 01/23/2014   to start ABX 01/24/2014   Allergy    Anxiety    Arthritis    feet, knee and base of thumbs   Asthma    controlled   Breast cancer (HCC) 2012   Right Breast Cancer   Breast cancer, stage 1 (HCC) 07/18/2011   Carpal tunnel syndrome     right   Dehydration 10/21/2011   Depression    Fibromyalgia    Hepatitis 1976   hepatitis B   History of hiatal hernia    Hyperlipidemia    Hypertension    Mononucleosis 1952   Multiple allergies    Nephritis 1955   Osteopenia    Personal history of chemotherapy 2012   Right Breast Cancer   PONV (postoperative nausea and vomiting)    has had to use scop patch    Past Surgical History:  Procedure Laterality Date   ABDOMINAL HYSTERECTOMY  09/15/1979   BREAST CAPSULECTOMY WITH IMPLANT EXCHANGE Right 03/02/2013   Procedure: BREAST CAPSULECTOMY WITH REPOSITIONING THE IMPLANT;  Surgeon: Wayland Denis, DO;  Location: [facility];  Service: Plastics;  Laterality: Right;   BREAST SURGERY     BUNIONECTOMY  09/14/1996   left foot   CATARACT EXTRACTION Left    ETHMOIDECTOMY  09/14/1989   EYE SURGERY     facial plastic surgery  09/14/1994   JOINT REPLACEMENT Left 09/15/2003   KNEE ARTHROPLASTY Right 01/29/2014   Procedure: COMPUTER ASSISTED TOTAL KNEE ARTHROPLASTY;  Surgeon: Eldred Manges, MD;  Location: [facility] OR;  Service:  Orthopedics;  Laterality: Right;  Right Total Knee Arthroplasty   KNEE ARTHROSCOPY  1996, 2001   left   lap band surgery  09/14/2008   LIPOSUCTION Bilateral 03/02/2013   Procedure: LIPOSUCTION;  Surgeon: Wayland Denis, DO;  Location: [facility];  Service: Plastics;  Laterality: Bilateral;   MASTECTOMY Right 09/14/2010   w/chemo   MASTOPEXY  04/13/2012   Procedure: MASTOPEXY;  Surgeon: Wayland Denis, DO;  Location: [facility];  Service: Plastics;  Laterality: Left;   left breast  mastopexy reduction for symmetry   NASAL SINUS SURGERY  09/15/1999   port-a-cath placement  06/26/2011   tip in lower SVC per chest x-ray read by Dr. Dwyane Dee   [facility] REMOVAL Left 03/02/2013   Procedure: REMOVAL PORT-A-CATH;  Surgeon: [name], MD;  Location: [facility];  Service: General;  Laterality: Left;   right  breast lumpectomy  09/14/1984   right ear surgery  09/14/1981   tumor removed (?)   right mastectomy  06/26/2011   TONSILLECTOMY  09/14/1948   TOTAL KNEE ARTHROPLASTY  09/14/2004   left   TUBAL LIGATION  09/14/1976    Social History:  reports that she has never smoked. She has never used smokeless tobacco. She reports that she does not drink alcohol and does not use drugs.   Allergies  Allergen Reactions   Alpha-Gal Anaphylaxis, Hives, Diarrhea and Other (See Comments)    NOTHING that includes anything mammal-related or sourced   Demerol [Meperidine] Nausea And Vomiting   Morphine Nausea And Vomiting and Other (See Comments)    PCA pump    Family History  Problem Relation Age of Onset   Breast cancer Paternal Aunt    Breast cancer Paternal Grandmother    Stroke Other    Hypertension Other    Heart disease Other     Family history reviewed and not pertinent    Prior to Admission medications   Medication Sig Start Date End Date Taking? Authorizing Provider  Ascorbic Acid (VITAMIN C PO) Take 1 tablet by mouth daily with breakfast.   Yes [provider]  cetirizine (ZYRTEC) 10 MG tablet Take 10 mg by mouth every morning.    Yes [provider]  Cholecalciferol (VITAMIN D-3) 5000 units TABS Take 5,000 Units by mouth daily.   Yes [provider]  Cyanocobalamin (VITAMIN B-12 PO) Take 1 tablet by mouth daily.   Yes [provider]  diphenhydrAMINE (BENADRYL) 25 mg capsule Take 25 mg by mouth every 6 (six) hours as needed (allergic reaction).   Yes [provider]  famotidine (PEPCID) 20 MG tablet Take 20 mg by mouth 2 (two) times daily as needed (for allergic reactions (re: Alpha Gal) and benmadryk).   Yes [provider]  lisinopril-hydrochlorothiazide (ZESTORETIC) 10-12.5 MG tablet Take 1 tablet by mouth daily.   Yes [provider]  magnesium gluconate (MAGONATE) 500 MG tablet Take 500 mg by mouth at bedtime.   Yes  [provider]  Multiple Vitamin (MULTIVITAMIN) capsule Take 1 capsule by mouth every morning.    Yes [provider]  MYRBETRIQ 50 MG TB24 tablet Take 50 mg by mouth at bedtime. 04/29/17  Yes [provider]  Omega-3 Fatty Acids (FISH OIL PO) Take 1,400 mg by mouth daily.   Yes [provider]  rosuvastatin (CRESTOR) 5 MG tablet Take 5 mg by mouth daily. 05/13/17  Yes [provider]  traMADol (ULTRAM) 50 MG tablet Take 50 mg by  mouth every 6 (six) hours as needed (for pain).   Yes [provider]  traZODone (DESYREL) 50 MG tablet Take 50 mg by mouth at bedtime as needed for sleep.   Yes [provider]  tretinoin (RETIN-A) 0.1 % cream Apply 1 application topically at bedtime.   Yes [provider]  Famotidine (PEPCID PO) Take 1 tablet by mouth daily as needed (for heartburn).    [provider]  FLUTICASONE PROPIONATE HFA IN Inhale 60 mcg into the lungs daily.    [provider]  PRESCRIPTION MEDICATION Take 1 tablet by mouth daily. Dication Quercetn 300 mg    [provider]  Varenicline Tartrate (TYRVAYA) 0.03 MG/ACT SOLN Place 1 spray into the nose daily at 6 (six) AM.    [provider]  diphenhydrAMINE (SOMINEX) 25 MG tablet Take 25 mg by mouth at bedtime as needed.    12/02/11  [provider]     Objective    Physical Exam: Vitals:   03/10/23 1548 03/10/23 1602 03/10/23 1845 03/10/23 1900  BP:   (!) 135/57 100/89  Pulse:   62 60  Resp:   14 17  Temp:      TempSrc:      SpO2:   95% 94%  Weight: 99.8 kg 125.6 kg    Height: 5\' 4"  (1.626 m)       General: appears to be stated age; alert, oriented Skin: warm, dry, no rash Head: Contusion associated with the will left forehead Mouth:  Oral mucosa membranes appear dry, normal dentition Neck: supple; trachea midline Heart:  RRR; did not appreciate any M/R/G Lungs: CTAB, did not appreciate any wheezes, rales, or  rhonchi Abdomen: + BS; soft, ND, NT Vascular: 2+ pedal pulses b/l; 2+ radial pulses b/l Extremities: no peripheral edema, no muscle wasting Neuro: strength and sensation intact in upper and lower extremities b/l     Labs on Admission: I have personally reviewed following labs and imaging studies  CBC: Recent Labs  Lab 03/10/23 1549  WBC 10.1  HGB 11.9*  HCT 36.6  MCV 91.5  PLT 169   Basic Metabolic Panel: Recent Labs  Lab 03/10/23 1549  NA 138  K 3.4*  CL 103  CO2 27  GLUCOSE 116*  BUN 18  CREATININE 0.65  CALCIUM 8.8*   GFR: Estimated Creatinine Clearance: 78.5 mL/min (by C-G formula based on SCr of 0.65 mg/dL). Liver Function Tests: No results for input(s): "AST", "ALT", "ALKPHOS", "BILITOT", "PROT", "ALBUMIN" in the last 168 hours. No results for input(s): "LIPASE", "AMYLASE" in the last 168 hours. No results for input(s): "AMMONIA" in the last 168 hours. Coagulation Profile: No results for input(s): "INR", "PROTIME" in the last 168 hours. Cardiac Enzymes: No results for input(s): "CKTOTAL", "CKMB", "CKMBINDEX", "TROPONINI" in the last 168 hours. BNP (last 3 results) No results for input(s): "PROBNP" in the last 8760 hours. HbA1C: No results for input(s): "HGBA1C" in the last 72 hours. CBG: Recent Labs  Lab 03/10/23 1632  GLUCAP 114*   Lipid Profile: No results for input(s): "CHOL", "HDL", "LDLCALC", "TRIG", "CHOLHDL", "LDLDIRECT" in the last 72 hours. Thyroid Function Tests: No results for input(s): "TSH", "T4TOTAL", "FREET4", "T3FREE", "THYROIDAB" in the last 72 hours. Anemia Panel: No results for input(s): "VITAMINB12", "FOLATE", "FERRITIN", "TIBC", "IRON", "RETICCTPCT" in the last 72 hours. Urine analysis:    Component Value Date/Time   COLORURINE STRAW (A) 03/10/2023 1649   APPEARANCEUR CLEAR 03/10/2023 1649   LABSPEC 1.006 03/10/2023 1649   PHURINE  8.0 03/10/2023 1649   GLUCOSEU NEGATIVE 03/10/2023 1649   HGBUR SMALL (A) 03/10/2023 1649    BILIRUBINUR NEGATIVE 03/10/2023 1649   KETONESUR NEGATIVE 03/10/2023 1649   PROTEINUR NEGATIVE 03/10/2023 1649   UROBILINOGEN 1.0 01/30/2014 0909   NITRITE NEGATIVE 03/10/2023 1649   LEUKOCYTESUR TRACE (A) 03/10/2023 1649    Radiological Exams on Admission: DG Foot 2 Views Right  Result Date: 03/10/2023 CLINICAL DATA:  Pain post fall. EXAM: RIGHT FOOT - 2 VIEW COMPARISON:  None Available. FINDINGS: There is an oblique mildly comminuted fracture of the right proximal first phalanx with extension to the metatarsophalangeal joint. There is a suspected angulated fracture of the head of the second metatarsal bone, which is poorly visualized due to obliquity. IMPRESSION: 1. Oblique mildly comminuted fracture of the right proximal first phalanx with extension to the metatarsophalangeal joint. 2. Suspected angulated fracture of the head of the second metatarsal bone, which is poorly visualized due to obliquity. Electronically Signed   By: Ted Mcalpine M.D.   On: 03/10/2023 17:17   DG Pelvis 1-2 Views  Result Date: 03/10/2023 CLINICAL DATA:  Trauma, fall EXAM: PELVIS - 1-2 VIEW COMPARISON:  Abdomen radiographs done on 12/10/2008 FINDINGS: No recent fracture or dislocation is seen. There is previous internal fixation in the right femur. Smooth marginated calcifications are noted adjacent to the greater trochanter of proximal right femur, possibly residual from previous trauma and previous surgery. IMPRESSION: No acute findings are seen in pelvis. Electronically Signed   By: Ernie Avena M.D.   On: 03/10/2023 17:15   DG Foot 2 Views Left  Result Date: 03/10/2023 CLINICAL DATA:  Trauma, fall EXAM: LEFT FOOT - 2 VIEW COMPARISON:  08/18/2018 FINDINGS: There is radiolucent line in the medial aspect of base of proximal phalanx of left big toe. There is also possible break in the cortical margin in the lateral aspect of base of proximal phalanx of left big toe. Deformity in the medial aspect of  distal first metatarsal may suggest previous osteotomy. Osteopenia. Soft tissue swelling is noted over the dorsum. There is flattening of plantar arch. IMPRESSION: Radiolucent lines are noted in the medial and lateral aspects of base of proximal phalanx of left big toe suggesting recent undisplaced fractures. Electronically Signed   By: Ernie Avena M.D.   On: 03/10/2023 17:14   CT Head Wo Contrast  Result Date: 03/10/2023 CLINICAL DATA:  Loss of consciousness last night with fall and trauma to the head and face. EXAM: CT HEAD WITHOUT CONTRAST CT MAXILLOFACIAL WITHOUT CONTRAST CT CERVICAL SPINE WITHOUT CONTRAST TECHNIQUE: Multidetector CT imaging of the head, cervical spine, and maxillofacial structures were performed using the standard protocol without intravenous contrast. Multiplanar CT image reconstructions of the cervical spine and maxillofacial structures were also generated. RADIATION DOSE REDUCTION: This exam was performed according to the departmental dose-optimization program which includes automated exposure control, adjustment of the mA and/or kV according to patient size and/or use of iterative reconstruction technique. COMPARISON:  None Available. FINDINGS: CT HEAD FINDINGS Brain: Previous right retro tympanic surgery. Wall down mastoidectomy. The brain itself appears normal. No evidence of atrophy, stroke, mass, hemorrhage, hydrocephalus or extra-axial collection. Vascular: There is atherosclerotic calcification of the major vessels at the base of the brain. Skull: No acute skull fracture. Other: None CT MAXILLOFACIAL FINDINGS Osseous: No acute facial fracture. Previous right mastoidectomy and occipital craniectomy. No acute traumatic finding in that region. Orbits: No orbital injury. Sinuses: No traumatic sinus fluid. Previous functional endoscopic sinus surgery. Soft tissues:  Soft tissue swelling of the superficial periorbital soft tissues and forehead region. CT CERVICAL SPINE FINDINGS  Alignment: No traumatic malalignment. Skull base and vertebrae: No regional fracture. Soft tissues and spinal canal: No traumatic soft tissue injury. Disc levels: The foramen magnum is widely patent. There is ordinary mild osteoarthritis of the C1-2 articulation but no encroachment upon the neural structures. C2-3: Facet osteoarthritis on the left. Mild left foraminal narrowing. C3-4: Facet osteoarthritis on the left. Mild bony foraminal narrowing on the left. C4-5: Facet osteoarthritis on the right.  No stenosis. C5-6: Spondylosis with endplate osteophytes in some calcified protruding disc material. No apparent compressive narrowing of the canal. Mild bony foraminal narrowing left more than right. C6-7: Spondylosis with bony foraminal narrowing on both sides. C7-T1: Facet osteoarthritis with 3 mm of degenerative anterolisthesis. No canal stenosis. Bilateral foraminal narrowing. Upper chest: Negative Other: None IMPRESSION: HEAD CT: No acute or traumatic finding. Previous right mastoidectomy and occipital craniectomy. MAXILLOFACIAL CT: No acute facial fracture. Superficial soft tissue swelling of the periorbital soft tissues and forehead region. CERVICAL SPINE CT: No acute or traumatic finding. Chronic degenerative changes as outlined above. Electronically Signed   By: Paulina Fusi M.D.   On: 03/10/2023 16:55   CT Maxillofacial Wo Contrast  Result Date: 03/10/2023 CLINICAL DATA:  Loss of consciousness last night with fall and trauma to the head and face. EXAM: CT HEAD WITHOUT CONTRAST CT MAXILLOFACIAL WITHOUT CONTRAST CT CERVICAL SPINE WITHOUT CONTRAST TECHNIQUE: Multidetector CT imaging of the head, cervical spine, and maxillofacial structures were performed using the standard protocol without intravenous contrast. Multiplanar CT image reconstructions of the cervical spine and maxillofacial structures were also generated. RADIATION DOSE REDUCTION: This exam was performed according to the departmental  dose-optimization program which includes automated exposure control, adjustment of the mA and/or kV according to patient size and/or use of iterative reconstruction technique. COMPARISON:  None Available. FINDINGS: CT HEAD FINDINGS Brain: Previous right retro tympanic surgery. Wall down mastoidectomy. The brain itself appears normal. No evidence of atrophy, stroke, mass, hemorrhage, hydrocephalus or extra-axial collection. Vascular: There is atherosclerotic calcification of the major vessels at the base of the brain. Skull: No acute skull fracture. Other: None CT MAXILLOFACIAL FINDINGS Osseous: No acute facial fracture. Previous right mastoidectomy and occipital craniectomy. No acute traumatic finding in that region. Orbits: No orbital injury. Sinuses: No traumatic sinus fluid. Previous functional endoscopic sinus surgery. Soft tissues: Soft tissue swelling of the superficial periorbital soft tissues and forehead region. CT CERVICAL SPINE FINDINGS Alignment: No traumatic malalignment. Skull base and vertebrae: No regional fracture. Soft tissues and spinal canal: No traumatic soft tissue injury. Disc levels: The foramen magnum is widely patent. There is ordinary mild osteoarthritis of the C1-2 articulation but no encroachment upon the neural structures. C2-3: Facet osteoarthritis on the left. Mild left foraminal narrowing. C3-4: Facet osteoarthritis on the left. Mild bony foraminal narrowing on the left. C4-5: Facet osteoarthritis on the right.  No stenosis. C5-6: Spondylosis with endplate osteophytes in some calcified protruding disc material. No apparent compressive narrowing of the canal. Mild bony foraminal narrowing left more than right. C6-7: Spondylosis with bony foraminal narrowing on both sides. C7-T1: Facet osteoarthritis with 3 mm of degenerative anterolisthesis. No canal stenosis. Bilateral foraminal narrowing. Upper chest: Negative Other: None IMPRESSION: HEAD CT: No acute or traumatic finding. Previous  right mastoidectomy and occipital craniectomy. MAXILLOFACIAL CT: No acute facial fracture. Superficial soft tissue swelling of the periorbital soft tissues and forehead region. CERVICAL SPINE CT: No acute or traumatic  finding. Chronic degenerative changes as outlined above. Electronically Signed   By: Paulina Fusi M.D.   On: 03/10/2023 16:55   CT Cervical Spine Wo Contrast  Result Date: 03/10/2023 CLINICAL DATA:  Loss of consciousness last night with fall and trauma to the head and face. EXAM: CT HEAD WITHOUT CONTRAST CT MAXILLOFACIAL WITHOUT CONTRAST CT CERVICAL SPINE WITHOUT CONTRAST TECHNIQUE: Multidetector CT imaging of the head, cervical spine, and maxillofacial structures were performed using the standard protocol without intravenous contrast. Multiplanar CT image reconstructions of the cervical spine and maxillofacial structures were also generated. RADIATION DOSE REDUCTION: This exam was performed according to the departmental dose-optimization program which includes automated exposure control, adjustment of the mA and/or kV according to patient size and/or use of iterative reconstruction technique. COMPARISON:  None Available. FINDINGS: CT HEAD FINDINGS Brain: Previous right retro tympanic surgery. Wall down mastoidectomy. The brain itself appears normal. No evidence of atrophy, stroke, mass, hemorrhage, hydrocephalus or extra-axial collection. Vascular: There is atherosclerotic calcification of the major vessels at the base of the brain. Skull: No acute skull fracture. Other: None CT MAXILLOFACIAL FINDINGS Osseous: No acute facial fracture. Previous right mastoidectomy and occipital craniectomy. No acute traumatic finding in that region. Orbits: No orbital injury. Sinuses: No traumatic sinus fluid. Previous functional endoscopic sinus surgery. Soft tissues: Soft tissue swelling of the superficial periorbital soft tissues and forehead region. CT CERVICAL SPINE FINDINGS Alignment: No traumatic  malalignment. Skull base and vertebrae: No regional fracture. Soft tissues and spinal canal: No traumatic soft tissue injury. Disc levels: The foramen magnum is widely patent. There is ordinary mild osteoarthritis of the C1-2 articulation but no encroachment upon the neural structures. C2-3: Facet osteoarthritis on the left. Mild left foraminal narrowing. C3-4: Facet osteoarthritis on the left. Mild bony foraminal narrowing on the left. C4-5: Facet osteoarthritis on the right.  No stenosis. C5-6: Spondylosis with endplate osteophytes in some calcified protruding disc material. No apparent compressive narrowing of the canal. Mild bony foraminal narrowing left more than right. C6-7: Spondylosis with bony foraminal narrowing on both sides. C7-T1: Facet osteoarthritis with 3 mm of degenerative anterolisthesis. No canal stenosis. Bilateral foraminal narrowing. Upper chest: Negative Other: None IMPRESSION: HEAD CT: No acute or traumatic finding. Previous right mastoidectomy and occipital craniectomy. MAXILLOFACIAL CT: No acute facial fracture. Superficial soft tissue swelling of the periorbital soft tissues and forehead region. CERVICAL SPINE CT: No acute or traumatic finding. Chronic degenerative changes as outlined above. Electronically Signed   By: Paulina Fusi M.D.   On: 03/10/2023 16:55      Assessment/Plan    Principal Problem:   Syncope Active Problems:   Dehydration   Unspecified fracture of right foot, initial encounter for closed fracture   Other fracture of left foot, initial encounter for closed fracture   Fall at home, initial encounter   Hypokalemia   Acute prerenal azotemia   Essential hypertension   Hyperlipidemia   Allergic rhinitis   Mild intermittent asthma   Urinary incontinence     #) Syncope: 1 episode of syncope that occurred when the patient was moving from a seated to standing position and associated with prodrome, suggestive of potential orthostatic hypotension in the  setting of dehydration given report of recent increase in frequency of her chronic diarrhea with likely contribution from relative increase in ostensible losses due to increase in time outside in increased ambient temperatures without compensatory increase in intake of water over that timeframe, and exacerbated by home pharmacologic factors serving to diminish compensatory peripheral  vasoconstrictive response in the setting of outpatient lisinopril, as well as further pharmacologic exacerbation from the intravascular depleting impact of home hydrochlorothiazide. Will check orthostatic vital signs, but with the caveat that the patient has already received IVF's in the ED, potentially altering the results of this evaluation.    Not associated with any overt acute focal neurologic deficits. Clinically, acute ischemic stroke versus seizures appear less likely at this time, while CT head shows no evidence of acute intracranial process. Presentation appears less consistent with ACS at this time, with  presenting EKG showing no evidence of acute ischemic changes, and the absence of any associated CP.    In setting of associated prodrome, the possibility of ventricular arrhythmia appears to be less likely at this time, although will closely monitor on telemetry for any e/o potential contributory arrhythmia, while also assessing serum Mg level, and providing potassium supplementation in the setting of mild presenting hypokalemia, as further detailed below.     Plan: I have placed a nursing communication order requesting that orthostatic vital signs x 1 set be checked and documented, following which will initiate gentle IVF's in the form of lactated Ringer's at 100 cc/h x 12 hours. Monitor on telemetry. Hold home lisinopril and HCTZ for now.   Monitor strict I's and O's.  Add-on serum Mg level.  Potassium chloride 40 mill equivalents p.o. x 1 dose now.  Check CMP, CBC, serum Mg level in the AM. Fall precautions  ordered.  Chest x-ray.                #) Fall: Ground-level in nature, and appears to be as a consequence of presenting syncopal episode, as further detailed above.  Not on any blood thinners as an outpatient.  While she did hit her head send prior to this fall, CT head shows notes of acute intracranial process, including evidence of acute intracranial hemorrhage , while CT cervical spine shows no evidence of acute process, as above.  No evidence of additional organic contribution predisposing to this fall, aside from the aforementioned episode of syncope.  No evidence of underlying infectious process, including urinalysis that was not associated with significant pyuria.  Will also follow-up result of chest x-ray.  Plan: Precautions ordered.  Further evaluation management of presenting episode of syncope, as above.  Follow-up result of chest x-ray.  CMP, CBC in the morning.  Gentle IV fluids, as above.  PT/OT consults ordered for the morning.              #) Bilateral toe fractures: Stemming from ground-level fall, plain films of the bilateral feet demonstrate evidence of undisplaced fracture at the base of the proximal phalanx of the left big toe as well as oblique mild comminuted fracture of the right proximal first phalanx with extension into the MTP joint, is well as radiographic evidence to suggest a suspected angulated fracture of the head of the second metatarsal bone.  Appears to be nonsurgical in nature, warranting conservative management, including pain control.  Bilateral lower EXTR.  Neurovascularly intact.  Patient also may benefit from a walker for stability purposes while healing from these bilateral first toe fractures.  Plan: Fall precautions ordered.  PT/OT consults placed.  Prn IV fentanyl.  Most consider prn Toradol.  I have placed a communication order, requesting the bilateral lower extremities to be elevated to assist with gravity induced decline and  associated swelling.                #)  Hypokalemia: presenting potassium level noted to be 3.4.  Likely contribution from acute on chronic diarrhea, as above.  Plan: monitor on tele. KCl 40 meq p.o. x 1 dose now. Add-on serum mag level. CMP, mag level in the AM.                      #) Dehydration: Clinical suspicion for such, including the appearance of dry oral mucous membranes as well as laboratory findings notable for acute prerenal azotemia. Appears to be in the setting of   recent increase in frequency of loose stool relative to her documented history of chronic diarrhea, as well as recent increase in ostensible losses in the setting of exposure to recent increase in ambient temperatures without compensatory increase in water, as further detailed above.  No e/o associated systemic hypotension, although there is suspicion that her systolic dehydration contributed to syncope on the basis of orthostatic hypotension, as further detailed above.   Plan: Monitor strict I's and O's.  Daily weights.  CMP in the morning. IVF's in form of IV Ringer's at 100 cc/h x 12 hours.  Nursing communication order placed requesting completion of orthostatic vital signs with associated documentation thereof.                #) Essential Hypertension: documented h/o such, with outpatient antihypertensive regimen including lisinopril and HCTZ.  SBP's in the ED today: Low 100s to 130s mmHg. however, in the setting of suspected orthostatic hypotension leading to presenting episode of syncope, will hold home HCTZ and lisinopril for now.  Plan: Close monitoring of subsequent BP via routine VS. orthostatic vital signs, as above.  Hold home HCTZ and lisinopril for now.  Monitor strict I's and O's and daily weights.                  #) Hyperlipidemia: documented h/o such. On rosuvastatin as outpatient.   Plan: continue home statin.                   #) Allergic Rhinitis: documented h/o such, on scheduled Zyrtec as outpatient.    Plan: cont home Zyrtec.                  # (History of mild intermittent asthma: Documented history of such, the patient on scheduled inhaled fluticasone as an outpatient.  No clinical evidence to suggest acute exacerbation thereof at this time.  Plan: Continue outpatient fluticasone.  Prn albuterol nebulizer.  Follow-up result of serum magnesium level.              #) Urinary incontinence: Documented history of such, for which the patient is on Myrbetriq as an outpatient.  Plan: Continue outpatient Myrbetriq.  Monitor strict I's and O's and daily weights.  CMP in the morning.      DVT prophylaxis: SCD's   Code Status: Full code Family Communication: none Disposition Plan: Per Rounding Team Consults called: none;  Admission status: obs    I SPENT GREATER THAN 75  MINUTES IN CLINICAL CARE TIME/MEDICAL DECISION-MAKING IN COMPLETING THIS ADMISSION.     Chaney Born Charlee Whitebread DO [facility] From 7PM - 7AM   03/10/2023, 9:11 PM

## 2023-03-10 NOTE — ED Provider Notes (Signed)
[facility] AT [facility] Provider Note   CSN: [id number] Arrival date & time: 03/10/23  1524     History  Chief Complaint  Patient presents with   Fall   Loss of Consciousness   Head Injury   Foot Injury    [name] is a 77 y.o. female with past medical history hypertension, hyperlipidemia, breast cancer in remission, asthma, fibromyalgia, alpha gal syndrome who presents to the ED complaining of syncopal episode.  She states that Monday and Tuesday of this week she had many episodes of nonbloody diarrhea.  States that she has this chronically.  No associated nausea, vomiting, abdominal pain, or fever.  States that she became very dehydrated and when she went out last night to take her dog outside to use the bathroom she became lightheaded and dizzy and woke up on the ground after syncopal episode.  Notes that she hit her face and is also complaining of pain in both of her feet.  States that when she stands she has increased pain in her legs and pressure in both of her feet.  No recurrent syncopal episodes.  Diarrhea has resolved.  Does not take anticoagulants.  Was ambulatory during the day today but with significant difficulty and due to pain in both of her feet. Tdap up to date.      Home Medications Prior to Admission medications   Medication Sig Start Date End Date Taking? Authorizing Provider  Ascorbic Acid (VITAMIN C PO) Take 1 tablet by mouth daily at 6 (six) AM.    [provider]  buPROPion (WELLBUTRIN XL) 150 MG 24 hr tablet Take 150 mg by mouth daily. 04/13/17   [provider]  cetirizine (ZYRTEC) 10 MG tablet Take 10 mg by mouth every morning.     [provider]  Cholecalciferol (VITAMIN D-3) 5000 units TABS Take 5,000 Units by mouth daily. Patient not taking: Reported on 02/02/2023    [provider]  Cyanocobalamin (VITAMIN B-12 PO) Take 1 tablet by mouth daily.    [provider]   diphenhydrAMINE (BENADRYL) 25 mg capsule Take 25 mg by mouth every 6 (six) hours as needed (allergic reaction).    [provider]  Famotidine (PEPCID PO) Take 1 tablet by mouth daily as needed (for heartburn).    [provider]  FLUTICASONE PROPIONATE HFA IN Inhale 60 mcg into the lungs daily.    [provider]  Boris Lown Oil 300 MG CAPS Take 1 capsule by mouth every morning.     [provider]  lisinopril-hydrochlorothiazide (ZESTORETIC) 10-12.5 MG tablet Take 1 tablet by mouth daily.    [provider]  magnesium gluconate (MAGONATE) 500 MG tablet Take 500 mg by mouth daily.    [provider]  Multiple Vitamin (MULTIVITAMIN) capsule Take 1 capsule by mouth every morning.     [provider]  MYRBETRIQ 50 MG TB24 tablet Take 50 mg by mouth daily. 04/29/17   [provider]  Omega-3 Fatty Acids (FISH OIL PO) Take 1,400 mg by mouth daily.    [provider]  PRESCRIPTION MEDICATION Take 1 tablet by mouth daily. Dication Quercetn 300 mg    [provider]  rosuvastatin (CRESTOR) 5 MG tablet Take 5 mg by mouth daily. 05/13/17   [provider]  traMADol (ULTRAM) 50 MG tablet Take 100 mg by mouth at bedtime.     [provider]  traZODone (DESYREL) 50 MG tablet Take 50 mg  by mouth at bedtime.    [provider]  tretinoin (RETIN-A) 0.1 % cream Apply 1 application topically at bedtime.    [provider]  UNABLE TO FIND Take 1 tablet by mouth daily. Calcium (500 mg) w/Vitamin D3 w/Magnesium 500 mg w/Zinc    [provider]  Varenicline Tartrate (TYRVAYA) 0.03 MG/ACT SOLN Place 1 spray into the nose daily at 6 (six) AM.    [provider]  diphenhydrAMINE (SOMINEX) 25 MG tablet Take 25 mg by mouth at bedtime as needed.    12/02/11  [provider]      Allergies    Alpha-gal, Demerol [meperidine], and Morphine and codeine    Review of Systems    Review of Systems  All other systems reviewed and are negative.   Physical Exam Updated Vital Signs BP 100/89   Pulse 60   Temp 98.9 F (37.2 C) (Oral)   Resp 17   Ht 5\' 4"  (1.626 m)   Wt 125.6 kg   SpO2 94%   BMI 47.55 kg/m  Physical Exam Vitals and nursing note reviewed.  Constitutional:      General: She is not in acute distress.    Appearance: Normal appearance. She is not ill-appearing, toxic-appearing or diaphoretic.  HENT:     Head: Normocephalic.     Comments: Large contusion over L eye with mild swelling to upper eyelid    Mouth/Throat:     Mouth: Mucous membranes are moist.  Eyes:     General: No scleral icterus.       Right eye: No discharge.        Left eye: No discharge.     Extraocular Movements: Extraocular movements intact.     Conjunctiva/sclera: Conjunctivae normal.     Pupils: Pupils are equal, round, and reactive to light.     Comments: Vision grossly intact  Cardiovascular:     Rate and Rhythm: Normal rate and regular rhythm.     Heart sounds: No murmur heard. Pulmonary:     Effort: Pulmonary effort is normal. No respiratory distress.     Breath sounds: Normal breath sounds. No stridor. No wheezing, rhonchi or rales.  Chest:     Chest wall: No tenderness.  Abdominal:     General: Abdomen is flat. There is no distension.     Palpations: Abdomen is soft.     Tenderness: There is no abdominal tenderness. There is no right CVA tenderness, left CVA tenderness, guarding or rebound.  Musculoskeletal:     Cervical back: Normal range of motion and neck supple. No rigidity or tenderness.     Comments: Both feet edematous with tenderness over great toes and questionable deformity to L great toe with overlying blood blister, 2+ DP and PT pulses bilaterally, normal sensation, no tenderness over bilateral ankles, tib/fibs, knees, thighs, or hips, pelvis stable, bilateral upper extremities with intact range of motion and without obvious deformity  Skin:     General: Skin is warm and dry.     Capillary Refill: Capillary refill takes less than 2 seconds.  Neurological:     General: No focal deficit present.     Mental Status: She is alert and oriented to person, place, and time.     GCS: GCS eye subscore is 4. GCS verbal subscore is 5. GCS motor subscore is 6.     Cranial Nerves: Cranial nerves 2-12 are intact.     Motor: Motor function is intact.  Psychiatric:  Speech: Speech normal.        Behavior: Behavior is cooperative.     ED Results / Procedures / Treatments   Labs (all labs ordered are listed, but only abnormal results are displayed) Labs Reviewed  BASIC METABOLIC PANEL - Abnormal; Notable for the following components:      Result Value   Potassium 3.4 (*)    Glucose, Bld 116 (*)    Calcium 8.8 (*)    All other components within normal limits  CBC - Abnormal; Notable for the following components:   Hemoglobin 11.9 (*)    All other components within normal limits  URINALYSIS, ROUTINE W REFLEX MICROSCOPIC - Abnormal; Notable for the following components:   Color, Urine STRAW (*)    Hgb urine dipstick SMALL (*)    Leukocytes,Ua TRACE (*)    All other components within normal limits  CBG MONITORING, ED - Abnormal; Notable for the following components:   Glucose-Capillary 114 (*)    All other components within normal limits    EKG EKG Interpretation  Date/Time:  Wednesday March 10 2023 15:59:18 EDT Ventricular Rate:  67 PR Interval:  164 QRS Duration: 107 QT Interval:  430 QTC Calculation: 454 R Axis:   37 Text Interpretation: Sinus rhythm Anteroseptal infarct, age indeterminate Confirmed by Vonita Moss [phone]) on 03/10/2023 4:36:57 PM  Radiology DG Foot 2 Views Right  Result Date: 03/10/2023 CLINICAL DATA:  Pain post fall. EXAM: RIGHT FOOT - 2 VIEW COMPARISON:  None Available. FINDINGS: There is an oblique mildly comminuted fracture of the right proximal first phalanx with extension to the  metatarsophalangeal joint. There is a suspected angulated fracture of the head of the second metatarsal bone, which is poorly visualized due to obliquity. IMPRESSION: 1. Oblique mildly comminuted fracture of the right proximal first phalanx with extension to the metatarsophalangeal joint. 2. Suspected angulated fracture of the head of the second metatarsal bone, which is poorly visualized due to obliquity. Electronically Signed   By: Ted Mcalpine M.D.   On: 03/10/2023 17:17   DG Pelvis 1-2 Views  Result Date: 03/10/2023 CLINICAL DATA:  Trauma, fall EXAM: PELVIS - 1-2 VIEW COMPARISON:  Abdomen radiographs done on 12/10/2008 FINDINGS: No recent fracture or dislocation is seen. There is previous internal fixation in the right femur. Smooth marginated calcifications are noted adjacent to the greater trochanter of proximal right femur, possibly residual from previous trauma and previous surgery. IMPRESSION: No acute findings are seen in pelvis. Electronically Signed   By: Ernie Avena M.D.   On: 03/10/2023 17:15   DG Foot 2 Views Left  Result Date: 03/10/2023 CLINICAL DATA:  Trauma, fall EXAM: LEFT FOOT - 2 VIEW COMPARISON:  08/18/2018 FINDINGS: There is radiolucent line in the medial aspect of base of proximal phalanx of left big toe. There is also possible break in the cortical margin in the lateral aspect of base of proximal phalanx of left big toe. Deformity in the medial aspect of distal first metatarsal may suggest previous osteotomy. Osteopenia. Soft tissue swelling is noted over the dorsum. There is flattening of plantar arch. IMPRESSION: Radiolucent lines are noted in the medial and lateral aspects of base of proximal phalanx of left big toe suggesting recent undisplaced fractures. Electronically Signed   By: Ernie Avena M.D.   On: 03/10/2023 17:14   CT Head Wo Contrast  Result Date: 03/10/2023 CLINICAL DATA:  Loss of consciousness last night with fall and trauma to the head and  face. EXAM: CT HEAD  WITHOUT CONTRAST CT MAXILLOFACIAL WITHOUT CONTRAST CT CERVICAL SPINE WITHOUT CONTRAST TECHNIQUE: Multidetector CT imaging of the head, cervical spine, and maxillofacial structures were performed using the standard protocol without intravenous contrast. Multiplanar CT image reconstructions of the cervical spine and maxillofacial structures were also generated. RADIATION DOSE REDUCTION: This exam was performed according to the departmental dose-optimization program which includes automated exposure control, adjustment of the mA and/or kV according to patient size and/or use of iterative reconstruction technique. COMPARISON:  None Available. FINDINGS: CT HEAD FINDINGS Brain: Previous right retro tympanic surgery. Wall down mastoidectomy. The brain itself appears normal. No evidence of atrophy, stroke, mass, hemorrhage, hydrocephalus or extra-axial collection. Vascular: There is atherosclerotic calcification of the major vessels at the base of the brain. Skull: No acute skull fracture. Other: None CT MAXILLOFACIAL FINDINGS Osseous: No acute facial fracture. Previous right mastoidectomy and occipital craniectomy. No acute traumatic finding in that region. Orbits: No orbital injury. Sinuses: No traumatic sinus fluid. Previous functional endoscopic sinus surgery. Soft tissues: Soft tissue swelling of the superficial periorbital soft tissues and forehead region. CT CERVICAL SPINE FINDINGS Alignment: No traumatic malalignment. Skull base and vertebrae: No regional fracture. Soft tissues and spinal canal: No traumatic soft tissue injury. Disc levels: The foramen magnum is widely patent. There is ordinary mild osteoarthritis of the C1-2 articulation but no encroachment upon the neural structures. C2-3: Facet osteoarthritis on the left. Mild left foraminal narrowing. C3-4: Facet osteoarthritis on the left. Mild bony foraminal narrowing on the left. C4-5: Facet osteoarthritis on the right.  No stenosis. C5-6:  Spondylosis with endplate osteophytes in some calcified protruding disc material. No apparent compressive narrowing of the canal. Mild bony foraminal narrowing left more than right. C6-7: Spondylosis with bony foraminal narrowing on both sides. C7-T1: Facet osteoarthritis with 3 mm of degenerative anterolisthesis. No canal stenosis. Bilateral foraminal narrowing. Upper chest: Negative Other: None IMPRESSION: HEAD CT: No acute or traumatic finding. Previous right mastoidectomy and occipital craniectomy. MAXILLOFACIAL CT: No acute facial fracture. Superficial soft tissue swelling of the periorbital soft tissues and forehead region. CERVICAL SPINE CT: No acute or traumatic finding. Chronic degenerative changes as outlined above. Electronically Signed   By: Paulina Fusi M.D.   On: 03/10/2023 16:55   CT Maxillofacial Wo Contrast  Result Date: 03/10/2023 CLINICAL DATA:  Loss of consciousness last night with fall and trauma to the head and face. EXAM: CT HEAD WITHOUT CONTRAST CT MAXILLOFACIAL WITHOUT CONTRAST CT CERVICAL SPINE WITHOUT CONTRAST TECHNIQUE: Multidetector CT imaging of the head, cervical spine, and maxillofacial structures were performed using the standard protocol without intravenous contrast. Multiplanar CT image reconstructions of the cervical spine and maxillofacial structures were also generated. RADIATION DOSE REDUCTION: This exam was performed according to the departmental dose-optimization program which includes automated exposure control, adjustment of the mA and/or kV according to patient size and/or use of iterative reconstruction technique. COMPARISON:  None Available. FINDINGS: CT HEAD FINDINGS Brain: Previous right retro tympanic surgery. Wall down mastoidectomy. The brain itself appears normal. No evidence of atrophy, stroke, mass, hemorrhage, hydrocephalus or extra-axial collection. Vascular: There is atherosclerotic calcification of the major vessels at the base of the brain. Skull: No  acute skull fracture. Other: None CT MAXILLOFACIAL FINDINGS Osseous: No acute facial fracture. Previous right mastoidectomy and occipital craniectomy. No acute traumatic finding in that region. Orbits: No orbital injury. Sinuses: No traumatic sinus fluid. Previous functional endoscopic sinus surgery. Soft tissues: Soft tissue swelling of the superficial periorbital soft tissues and forehead region. CT CERVICAL SPINE FINDINGS  Alignment: No traumatic malalignment. Skull base and vertebrae: No regional fracture. Soft tissues and spinal canal: No traumatic soft tissue injury. Disc levels: The foramen magnum is widely patent. There is ordinary mild osteoarthritis of the C1-2 articulation but no encroachment upon the neural structures. C2-3: Facet osteoarthritis on the left. Mild left foraminal narrowing. C3-4: Facet osteoarthritis on the left. Mild bony foraminal narrowing on the left. C4-5: Facet osteoarthritis on the right.  No stenosis. C5-6: Spondylosis with endplate osteophytes in some calcified protruding disc material. No apparent compressive narrowing of the canal. Mild bony foraminal narrowing left more than right. C6-7: Spondylosis with bony foraminal narrowing on both sides. C7-T1: Facet osteoarthritis with 3 mm of degenerative anterolisthesis. No canal stenosis. Bilateral foraminal narrowing. Upper chest: Negative Other: None IMPRESSION: HEAD CT: No acute or traumatic finding. Previous right mastoidectomy and occipital craniectomy. MAXILLOFACIAL CT: No acute facial fracture. Superficial soft tissue swelling of the periorbital soft tissues and forehead region. CERVICAL SPINE CT: No acute or traumatic finding. Chronic degenerative changes as outlined above. Electronically Signed   By: Paulina Fusi M.D.   On: 03/10/2023 16:55   CT Cervical Spine Wo Contrast  Result Date: 03/10/2023 CLINICAL DATA:  Loss of consciousness last night with fall and trauma to the head and face. EXAM: CT HEAD WITHOUT CONTRAST CT  MAXILLOFACIAL WITHOUT CONTRAST CT CERVICAL SPINE WITHOUT CONTRAST TECHNIQUE: Multidetector CT imaging of the head, cervical spine, and maxillofacial structures were performed using the standard protocol without intravenous contrast. Multiplanar CT image reconstructions of the cervical spine and maxillofacial structures were also generated. RADIATION DOSE REDUCTION: This exam was performed according to the departmental dose-optimization program which includes automated exposure control, adjustment of the mA and/or kV according to patient size and/or use of iterative reconstruction technique. COMPARISON:  None Available. FINDINGS: CT HEAD FINDINGS Brain: Previous right retro tympanic surgery. Wall down mastoidectomy. The brain itself appears normal. No evidence of atrophy, stroke, mass, hemorrhage, hydrocephalus or extra-axial collection. Vascular: There is atherosclerotic calcification of the major vessels at the base of the brain. Skull: No acute skull fracture. Other: None CT MAXILLOFACIAL FINDINGS Osseous: No acute facial fracture. Previous right mastoidectomy and occipital craniectomy. No acute traumatic finding in that region. Orbits: No orbital injury. Sinuses: No traumatic sinus fluid. Previous functional endoscopic sinus surgery. Soft tissues: Soft tissue swelling of the superficial periorbital soft tissues and forehead region. CT CERVICAL SPINE FINDINGS Alignment: No traumatic malalignment. Skull base and vertebrae: No regional fracture. Soft tissues and spinal canal: No traumatic soft tissue injury. Disc levels: The foramen magnum is widely patent. There is ordinary mild osteoarthritis of the C1-2 articulation but no encroachment upon the neural structures. C2-3: Facet osteoarthritis on the left. Mild left foraminal narrowing. C3-4: Facet osteoarthritis on the left. Mild bony foraminal narrowing on the left. C4-5: Facet osteoarthritis on the right.  No stenosis. C5-6: Spondylosis with endplate osteophytes  in some calcified protruding disc material. No apparent compressive narrowing of the canal. Mild bony foraminal narrowing left more than right. C6-7: Spondylosis with bony foraminal narrowing on both sides. C7-T1: Facet osteoarthritis with 3 mm of degenerative anterolisthesis. No canal stenosis. Bilateral foraminal narrowing. Upper chest: Negative Other: None IMPRESSION: HEAD CT: No acute or traumatic finding. Previous right mastoidectomy and occipital craniectomy. MAXILLOFACIAL CT: No acute facial fracture. Superficial soft tissue swelling of the periorbital soft tissues and forehead region. CERVICAL SPINE CT: No acute or traumatic finding. Chronic degenerative changes as outlined above. Electronically Signed   By: Scherrie Bateman.D.  On: 03/10/2023 16:55    Procedures Procedures    Medications Ordered in ED Medications  diphenhydrAMINE (BENADRYL) injection 50 mg (50 mg Intravenous Patient Refused/Not Given 03/10/23 1712)  fentaNYL (SUBLIMAZE) injection 50 mcg (50 mcg Intravenous Given 03/10/23 1622)  ketorolac (TORADOL) 15 MG/ML injection 15 mg (15 mg Intravenous Given 03/10/23 1805)  fentaNYL (SUBLIMAZE) injection 50 mcg (50 mcg Intravenous Given 03/10/23 1805)  lactated ringers bolus 1,000 mL (1,000 mLs Intravenous New Bag/Given 03/10/23 1811)    ED Course/ Medical Decision Making/ A&P                             Medical Decision Making Amount and/or Complexity of Data Reviewed Labs: ordered. Decision-making details documented in ED Course. Radiology: ordered. Decision-making details documented in ED Course. ECG/medicine tests: ordered. Decision-making details documented in ED Course.  Risk Prescription drug management. Decision regarding hospitalization.   Medical Decision Making:   LUJUANA KAPLER is a 77 y.o. female who presented to the ED today with syncope detailed above.    Patient's presentation is complicated by their history of advanced age, multiple comorbidities.  Complete  initial physical exam performed, notably the patient was in NAD. Neurologically intact. Large contusion to L face. Both feet edematous with tenderness over great toes and questionable deformity to L great toe with overlying blood blister, 2+ DP and PT pulses bilaterally, normal sensation, no tenderness over bilateral ankles, tib/fibs, knees, thighs, or hips, pelvis stable, bilateral upper extremities with intact range of motion and without obvious deformity.    Reviewed and confirmed nursing documentation for past medical history, family history, social history.    Initial Assessment:   With the patient's presentation, the differential diagnosis for AMS is extensive and includes, but is not limited to: drug overdose - opioids, alcohol, sedatives, antipsychotics, drug withdrawal, others; Metabolic: hypoxia, hypoglycemia, hyperglycemia, hypercalcemia, hypernatremia, hyponatremia, uremia, hepatic encephalopathy, hypothyroidism, hyperthyroidism, vitamin B12 or thiamine deficiency, carbon monoxide poisoning, Wilson's disease, Lactic acidosis, DKA/HHOS; Infectious: meningitis, encephalitis, bacteremia/sepsis, urinary tract infection, pneumonia, neurosyphilis; Structural: Space-occupying lesion, (brain tumor, subdural hematoma, hydrocephalus,); Vascular: stroke, subarachnoid hemorrhage, coronary ischemia, hypertensive encephalopathy, CNS vasculitis, thrombotic thrombocytopenic purpura, disseminated intravascular coagulation, hyperviscosity; Psychiatric: Schizophrenia, depression; Other: Seizure, hypothermia, heat stroke, dementia; Traumatic injuries  This is most consistent with an acute complicated illness  Initial Plan:  Screening labs including CBC and Metabolic panel to evaluate for infectious or metabolic etiology of disease.  Urinalysis with reflex culture ordered to evaluate for UTI or relevant urologic/nephrologic pathology.  CT brain, MXF, C spine to assess for traumatic injuries EKG to evaluate for  cardiac pathology Pelvic, foot XR to assess for traumatic pathology Symptomatic management Objective evaluation as below reviewed   Initial Study Results:   Laboratory  All laboratory results reviewed without evidence of clinically relevant pathology.   Exceptions include: K3.4, hemoglobin 11.9, UA with trace leukocytes  EKG EKG was reviewed independently. NSR. ST segments without concerns for elevations.    Radiology:  All images reviewed independently. Agree with radiology report at this time.   DG Foot 2 Views Right  Result Date: 03/10/2023 CLINICAL DATA:  Pain post fall. EXAM: RIGHT FOOT - 2 VIEW COMPARISON:  None Available. FINDINGS: There is an oblique mildly comminuted fracture of the right proximal first phalanx with extension to the metatarsophalangeal joint. There is a suspected angulated fracture of the head of the second metatarsal bone, which is poorly visualized due to obliquity. IMPRESSION: 1. Oblique mildly  comminuted fracture of the right proximal first phalanx with extension to the metatarsophalangeal joint. 2. Suspected angulated fracture of the head of the second metatarsal bone, which is poorly visualized due to obliquity. Electronically Signed   By: Ted Mcalpine M.D.   On: 03/10/2023 17:17   DG Pelvis 1-2 Views  Result Date: 03/10/2023 CLINICAL DATA:  Trauma, fall EXAM: PELVIS - 1-2 VIEW COMPARISON:  Abdomen radiographs done on 12/10/2008 FINDINGS: No recent fracture or dislocation is seen. There is previous internal fixation in the right femur. Smooth marginated calcifications are noted adjacent to the greater trochanter of proximal right femur, possibly residual from previous trauma and previous surgery. IMPRESSION: No acute findings are seen in pelvis. Electronically Signed   By: Ernie Avena M.D.   On: 03/10/2023 17:15   DG Foot 2 Views Left  Result Date: 03/10/2023 CLINICAL DATA:  Trauma, fall EXAM: LEFT FOOT - 2 VIEW COMPARISON:  08/18/2018 FINDINGS:  There is radiolucent line in the medial aspect of base of proximal phalanx of left big toe. There is also possible break in the cortical margin in the lateral aspect of base of proximal phalanx of left big toe. Deformity in the medial aspect of distal first metatarsal may suggest previous osteotomy. Osteopenia. Soft tissue swelling is noted over the dorsum. There is flattening of plantar arch. IMPRESSION: Radiolucent lines are noted in the medial and lateral aspects of base of proximal phalanx of left big toe suggesting recent undisplaced fractures. Electronically Signed   By: Ernie Avena M.D.   On: 03/10/2023 17:14   CT Head Wo Contrast  Result Date: 03/10/2023 CLINICAL DATA:  Loss of consciousness last night with fall and trauma to the head and face. EXAM: CT HEAD WITHOUT CONTRAST CT MAXILLOFACIAL WITHOUT CONTRAST CT CERVICAL SPINE WITHOUT CONTRAST TECHNIQUE: Multidetector CT imaging of the head, cervical spine, and maxillofacial structures were performed using the standard protocol without intravenous contrast. Multiplanar CT image reconstructions of the cervical spine and maxillofacial structures were also generated. RADIATION DOSE REDUCTION: This exam was performed according to the departmental dose-optimization program which includes automated exposure control, adjustment of the mA and/or kV according to patient size and/or use of iterative reconstruction technique. COMPARISON:  None Available. FINDINGS: CT HEAD FINDINGS Brain: Previous right retro tympanic surgery. Wall down mastoidectomy. The brain itself appears normal. No evidence of atrophy, stroke, mass, hemorrhage, hydrocephalus or extra-axial collection. Vascular: There is atherosclerotic calcification of the major vessels at the base of the brain. Skull: No acute skull fracture. Other: None CT MAXILLOFACIAL FINDINGS Osseous: No acute facial fracture. Previous right mastoidectomy and occipital craniectomy. No acute traumatic finding in that  region. Orbits: No orbital injury. Sinuses: No traumatic sinus fluid. Previous functional endoscopic sinus surgery. Soft tissues: Soft tissue swelling of the superficial periorbital soft tissues and forehead region. CT CERVICAL SPINE FINDINGS Alignment: No traumatic malalignment. Skull base and vertebrae: No regional fracture. Soft tissues and spinal canal: No traumatic soft tissue injury. Disc levels: The foramen magnum is widely patent. There is ordinary mild osteoarthritis of the C1-2 articulation but no encroachment upon the neural structures. C2-3: Facet osteoarthritis on the left. Mild left foraminal narrowing. C3-4: Facet osteoarthritis on the left. Mild bony foraminal narrowing on the left. C4-5: Facet osteoarthritis on the right.  No stenosis. C5-6: Spondylosis with endplate osteophytes in some calcified protruding disc material. No apparent compressive narrowing of the canal. Mild bony foraminal narrowing left more than right. C6-7: Spondylosis with bony foraminal narrowing on both sides. C7-T1: Facet  osteoarthritis with 3 mm of degenerative anterolisthesis. No canal stenosis. Bilateral foraminal narrowing. Upper chest: Negative Other: None IMPRESSION: HEAD CT: No acute or traumatic finding. Previous right mastoidectomy and occipital craniectomy. MAXILLOFACIAL CT: No acute facial fracture. Superficial soft tissue swelling of the periorbital soft tissues and forehead region. CERVICAL SPINE CT: No acute or traumatic finding. Chronic degenerative changes as outlined above. Electronically Signed   By: Paulina Fusi M.D.   On: 03/10/2023 16:55   CT Maxillofacial Wo Contrast  Result Date: 03/10/2023 CLINICAL DATA:  Loss of consciousness last night with fall and trauma to the head and face. EXAM: CT HEAD WITHOUT CONTRAST CT MAXILLOFACIAL WITHOUT CONTRAST CT CERVICAL SPINE WITHOUT CONTRAST TECHNIQUE: Multidetector CT imaging of the head, cervical spine, and maxillofacial structures were performed using the  standard protocol without intravenous contrast. Multiplanar CT image reconstructions of the cervical spine and maxillofacial structures were also generated. RADIATION DOSE REDUCTION: This exam was performed according to the departmental dose-optimization program which includes automated exposure control, adjustment of the mA and/or kV according to patient size and/or use of iterative reconstruction technique. COMPARISON:  None Available. FINDINGS: CT HEAD FINDINGS Brain: Previous right retro tympanic surgery. Wall down mastoidectomy. The brain itself appears normal. No evidence of atrophy, stroke, mass, hemorrhage, hydrocephalus or extra-axial collection. Vascular: There is atherosclerotic calcification of the major vessels at the base of the brain. Skull: No acute skull fracture. Other: None CT MAXILLOFACIAL FINDINGS Osseous: No acute facial fracture. Previous right mastoidectomy and occipital craniectomy. No acute traumatic finding in that region. Orbits: No orbital injury. Sinuses: No traumatic sinus fluid. Previous functional endoscopic sinus surgery. Soft tissues: Soft tissue swelling of the superficial periorbital soft tissues and forehead region. CT CERVICAL SPINE FINDINGS Alignment: No traumatic malalignment. Skull base and vertebrae: No regional fracture. Soft tissues and spinal canal: No traumatic soft tissue injury. Disc levels: The foramen magnum is widely patent. There is ordinary mild osteoarthritis of the C1-2 articulation but no encroachment upon the neural structures. C2-3: Facet osteoarthritis on the left. Mild left foraminal narrowing. C3-4: Facet osteoarthritis on the left. Mild bony foraminal narrowing on the left. C4-5: Facet osteoarthritis on the right.  No stenosis. C5-6: Spondylosis with endplate osteophytes in some calcified protruding disc material. No apparent compressive narrowing of the canal. Mild bony foraminal narrowing left more than right. C6-7: Spondylosis with bony foraminal  narrowing on both sides. C7-T1: Facet osteoarthritis with 3 mm of degenerative anterolisthesis. No canal stenosis. Bilateral foraminal narrowing. Upper chest: Negative Other: None IMPRESSION: HEAD CT: No acute or traumatic finding. Previous right mastoidectomy and occipital craniectomy. MAXILLOFACIAL CT: No acute facial fracture. Superficial soft tissue swelling of the periorbital soft tissues and forehead region. CERVICAL SPINE CT: No acute or traumatic finding. Chronic degenerative changes as outlined above. Electronically Signed   By: Paulina Fusi M.D.   On: 03/10/2023 16:55   CT Cervical Spine Wo Contrast  Result Date: 03/10/2023 CLINICAL DATA:  Loss of consciousness last night with fall and trauma to the head and face. EXAM: CT HEAD WITHOUT CONTRAST CT MAXILLOFACIAL WITHOUT CONTRAST CT CERVICAL SPINE WITHOUT CONTRAST TECHNIQUE: Multidetector CT imaging of the head, cervical spine, and maxillofacial structures were performed using the standard protocol without intravenous contrast. Multiplanar CT image reconstructions of the cervical spine and maxillofacial structures were also generated. RADIATION DOSE REDUCTION: This exam was performed according to the departmental dose-optimization program which includes automated exposure control, adjustment of the mA and/or kV according to patient size and/or use of iterative reconstruction  technique. COMPARISON:  None Available. FINDINGS: CT HEAD FINDINGS Brain: Previous right retro tympanic surgery. Wall down mastoidectomy. The brain itself appears normal. No evidence of atrophy, stroke, mass, hemorrhage, hydrocephalus or extra-axial collection. Vascular: There is atherosclerotic calcification of the major vessels at the base of the brain. Skull: No acute skull fracture. Other: None CT MAXILLOFACIAL FINDINGS Osseous: No acute facial fracture. Previous right mastoidectomy and occipital craniectomy. No acute traumatic finding in that region. Orbits: No orbital injury.  Sinuses: No traumatic sinus fluid. Previous functional endoscopic sinus surgery. Soft tissues: Soft tissue swelling of the superficial periorbital soft tissues and forehead region. CT CERVICAL SPINE FINDINGS Alignment: No traumatic malalignment. Skull base and vertebrae: No regional fracture. Soft tissues and spinal canal: No traumatic soft tissue injury. Disc levels: The foramen magnum is widely patent. There is ordinary mild osteoarthritis of the C1-2 articulation but no encroachment upon the neural structures. C2-3: Facet osteoarthritis on the left. Mild left foraminal narrowing. C3-4: Facet osteoarthritis on the left. Mild bony foraminal narrowing on the left. C4-5: Facet osteoarthritis on the right.  No stenosis. C5-6: Spondylosis with endplate osteophytes in some calcified protruding disc material. No apparent compressive narrowing of the canal. Mild bony foraminal narrowing left more than right. C6-7: Spondylosis with bony foraminal narrowing on both sides. C7-T1: Facet osteoarthritis with 3 mm of degenerative anterolisthesis. No canal stenosis. Bilateral foraminal narrowing. Upper chest: Negative Other: None IMPRESSION: HEAD CT: No acute or traumatic finding. Previous right mastoidectomy and occipital craniectomy. MAXILLOFACIAL CT: No acute facial fracture. Superficial soft tissue swelling of the periorbital soft tissues and forehead region. CERVICAL SPINE CT: No acute or traumatic finding. Chronic degenerative changes as outlined above. Electronically Signed   By: Paulina Fusi M.D.   On: 03/10/2023 16:55      Consults: Case discussed with Dr. Arlean Hopping with [facility] who accepts admission.   Final Assessment and Plan:   77 year old female presents to the ED for evaluation of facial trauma and bilateral foot pain following syncopal episode.  Notes preceding prodrome as well as 2 days of preceding recurring diarrhea was likely dehydration.  Diarrhea has resolved.  No associated fever.  No associated vomiting.   She does have a large contusion over the left face but facial bones are stable.  No midline spinal tenderness or deformities.  Abdomen soft and nontender.  Pelvis stable.  She does have large amount of ecchymosis over both dorsal aspects of her feet with a blood blister to the great toe on the left.  X-ray shows multiple phalanx fractures.  Patient notes any chronic balance issues.  She does live with her daughter but she cannot pick her up tonight.  Good pain control following multiple medications given in the ED.  CT maxillofacial, brain, cervical spine without acute injuries.  Patient is not anticoagulated.  Reassuring labs.  Given fluids to help with any underlying dehydration.  Maintaining blood pressure.  Initially discussed findings with patient.  With history of balance issues, she would prefer to have a rolling walker at home.  Home health ordered and patient has been accepted by home health.  Discussed with case management and they are unable to assist with getting a rolling walker tonight but this can be done in the morning.  Patient does not believe that she can ambulate without this.  With this, discussed with hospitalist who will admit overnight for observation.  Patient agreeable with plan.  Patient stable at time of admission.   Clinical Impression:  1. Syncope and  collapse   2. Contusion of face, initial encounter   3. Dehydration   4. Diarrhea, unspecified type   5. Nondisplaced fracture of proximal phalanx of left great toe, initial encounter for closed fracture   6. Nondisplaced fracture of proximal phalanx of right great toe, initial encounter for closed fracture   7. Closed nondisplaced fracture of second metatarsal bone of right foot, initial encounter      Data Unavailable           Final Clinical Impression(s) / ED Diagnoses Final diagnoses:  Syncope and collapse  Contusion of face, initial encounter  Dehydration  Diarrhea, unspecified type  Nondisplaced fracture  of proximal phalanx of left great toe, initial encounter for closed fracture  Nondisplaced fracture of proximal phalanx of right great toe, initial encounter for closed fracture  Closed nondisplaced fracture of second metatarsal bone of right foot, initial encounter    Rx / DC Orders ED Discharge Orders          Ordered    For home use only DME 4 wheeled rolling walker with seat        03/10/23 1738    For home use only DME Walker rolling        03/10/23 1855    Home Health        03/10/23 1855    Face-to-face encounter (required for Medicare/Medicaid patients)       Comments: I Tonette Lederer certify that this patient is under my care and that I, or a nurse practitioner or physician's assistant working with me, had a face-to-face encounter that meets the physician face-to-face encounter requirements with this patient on 03/10/2023. The encounter with the patient was in whole, or in part for the following medical condition(s) which is the primary reason for home health care (List medical condition): Multiple toe fractures with change in ambulation status   03/10/23 1855              Tonette Lederer, PA-C 03/11/23 0002    Rondel Baton, MD 03/11/23 [phone]

## 2023-03-11 ENCOUNTER — Observation Stay (HOSPITAL_BASED_OUTPATIENT_CLINIC_OR_DEPARTMENT_OTHER): Payer: Medicare PPO

## 2023-03-11 DIAGNOSIS — R55 Syncope and collapse: Secondary | ICD-10-CM | POA: Diagnosis not present

## 2023-03-11 DIAGNOSIS — I10 Essential (primary) hypertension: Secondary | ICD-10-CM | POA: Diagnosis not present

## 2023-03-11 DIAGNOSIS — E876 Hypokalemia: Secondary | ICD-10-CM | POA: Diagnosis not present

## 2023-03-11 DIAGNOSIS — E860 Dehydration: Secondary | ICD-10-CM | POA: Diagnosis not present

## 2023-03-11 DIAGNOSIS — E785 Hyperlipidemia, unspecified: Secondary | ICD-10-CM | POA: Diagnosis not present

## 2023-03-11 DIAGNOSIS — W010XXA Fall on same level from slipping, tripping and stumbling without subsequent striking against object, initial encounter: Secondary | ICD-10-CM | POA: Diagnosis not present

## 2023-03-11 DIAGNOSIS — S92412A Displaced fracture of proximal phalanx of left great toe, initial encounter for closed fracture: Secondary | ICD-10-CM | POA: Diagnosis not present

## 2023-03-11 DIAGNOSIS — N178 Other acute kidney failure: Secondary | ICD-10-CM | POA: Diagnosis not present

## 2023-03-11 DIAGNOSIS — S92411A Displaced fracture of proximal phalanx of right great toe, initial encounter for closed fracture: Secondary | ICD-10-CM | POA: Diagnosis not present

## 2023-03-11 LAB — COMPREHENSIVE METABOLIC PANEL
ALT: 16 U/L (ref 0–44)
AST: 23 U/L (ref 15–41)
Albumin: 3 g/dL — ABNORMAL LOW (ref 3.5–5.0)
Alkaline Phosphatase: 40 U/L (ref 38–126)
Anion gap: 8 (ref 5–15)
BUN: 16 mg/dL (ref 8–23)
CO2: 24 mmol/L (ref 22–32)
Calcium: 8.4 mg/dL — ABNORMAL LOW (ref 8.9–10.3)
Chloride: 105 mmol/L (ref 98–111)
Creatinine, Ser: 0.62 mg/dL (ref 0.44–1.00)
GFR, Estimated: >60 mL/min (ref >60)
Glucose, Bld: 105 mg/dL — ABNORMAL HIGH (ref 70–99)
Potassium: 3.6 mmol/L (ref 3.5–5.1)
Sodium: 137 mmol/L (ref 135–145)
Total Bilirubin: 0.9 mg/dL (ref 0.3–1.2)
Total Protein: 5.4 g/dL — ABNORMAL LOW (ref 6.5–8.1)

## 2023-03-11 LAB — ECHOCARDIOGRAM COMPLETE
AR max vel: 2.18 cm2
AV Area VTI: 2.11 cm2
AV Area mean vel: 2.06 cm2
AV Mean grad: 7 mmHg
AV Peak grad: 11.3 mmHg
Ao pk vel: 1.68 m/s
Area-P 1/2: 4.8 cm2
Height: 64 in
S' Lateral: 3.5 cm
Weight: 4432 oz

## 2023-03-11 LAB — CBC WITH DIFFERENTIAL/PLATELET
Abs Immature Granulocytes: 0.05 10*3/uL (ref 0.00–0.07)
Basophils Absolute: 0 10*3/uL (ref 0.0–0.1)
Basophils Relative: 1 %
Eosinophils Absolute: 0.4 10*3/uL (ref 0.0–0.5)
Eosinophils Relative: 5 %
HCT: 33.3 % — ABNORMAL LOW (ref 36.0–46.0)
Hemoglobin: 10.7 g/dL — ABNORMAL LOW (ref 12.0–15.0)
Immature Granulocytes: 1 %
Lymphocytes Relative: 21 %
Lymphs Abs: 1.6 10*3/uL (ref 0.7–4.0)
MCH: 29.9 pg (ref 26.0–34.0)
MCHC: 32.1 g/dL (ref 30.0–36.0)
MCV: 93 fL (ref 80.0–100.0)
Monocytes Absolute: 1.1 10*3/uL — ABNORMAL HIGH (ref 0.1–1.0)
Monocytes Relative: 15 %
Neutro Abs: 4.4 10*3/uL (ref 1.7–7.7)
Neutrophils Relative %: 57 %
Platelets: 149 10*3/uL — ABNORMAL LOW (ref 150–400)
RBC: 3.58 MIL/uL — ABNORMAL LOW (ref 3.87–5.11)
RDW: 13.2 % (ref 11.5–15.5)
WBC: 7.5 10*3/uL (ref 4.0–10.5)
nRBC: 0 % (ref 0.0–0.2)

## 2023-03-11 LAB — MAGNESIUM: Magnesium: 1.6 mg/dL — ABNORMAL LOW (ref 1.7–2.4)

## 2023-03-11 MED ORDER — HYDROCHLOROTHIAZIDE 12.5 MG PO TABS
12.5000 mg | ORAL_TABLET | Freq: Every day | ORAL | Status: DC
  Administered 2023-03-12: 12.5 mg via ORAL
  Filled 2023-03-11 (×2): qty 1

## 2023-03-11 MED ORDER — FLUOXETINE HCL 20 MG PO CAPS
20.0000 mg | ORAL_CAPSULE | Freq: Every day | ORAL | Status: DC
  Administered 2023-03-11 – 2023-03-12 (×2): 20 mg via ORAL
  Filled 2023-03-11 (×2): qty 1

## 2023-03-11 MED ORDER — FAMOTIDINE 20 MG PO TABS: 20.0000 mg | ORAL_TABLET | Freq: Two times a day (BID) | ORAL | Status: DC | PRN

## 2023-03-11 MED ORDER — DIPHENHYDRAMINE HCL 25 MG PO CAPS
25.0000 mg | ORAL_CAPSULE | Freq: Four times a day (QID) | ORAL | Status: DC | PRN
  Administered 2023-03-11: 25 mg via ORAL
  Filled 2023-03-11: qty 1

## 2023-03-11 MED ORDER — EPINEPHRINE 0.3 MG/0.3ML IJ SOAJ: 0.3000 mg | INTRAMUSCULAR | Status: DC | PRN

## 2023-03-11 MED ORDER — LISINOPRIL 10 MG PO TABS
10.0000 mg | ORAL_TABLET | Freq: Every day | ORAL | Status: DC
  Administered 2023-03-12: 10 mg via ORAL
  Filled 2023-03-11 (×2): qty 1

## 2023-03-11 MED ORDER — MAGNESIUM GLUCONATE 500 MG PO TABS
500.0000 mg | ORAL_TABLET | Freq: Every day | ORAL | Status: DC
  Administered 2023-03-11: 500 mg via ORAL
  Filled 2023-03-11 (×2): qty 1

## 2023-03-11 MED ORDER — MAGNESIUM SULFATE 2 GM/50ML IV SOLN
2.0000 g | Freq: Once | INTRAVENOUS | Status: AC
  Administered 2023-03-11: 2 g via INTRAVENOUS
  Filled 2023-03-11: qty 50

## 2023-03-11 MED ORDER — LISINOPRIL-HYDROCHLOROTHIAZIDE 10-12.5 MG PO TABS: 1.0000 | ORAL_TABLET | Freq: Every day | ORAL | Status: DC

## 2023-03-11 NOTE — Progress Notes (Signed)
PROGRESS NOTE   [name]  ZOX:[id number]    DOB: May 28, 1946    DOA: 03/10/2023  PCP: Thana Ates, MD   I have briefly reviewed patients previous medical records in [facility].  Chief Complaint  Patient presents with   Fall   Loss of Consciousness   Head Injury   Foot Injury    Brief Narrative:  77 year old female, lives alone/independent and drives, extensive past medical history including alpha gal syndrome, multiple allergies, anxiety and depression, asthma, HLD, HTN, presented to the ED following a syncopal episode, fall, and multiple injuries.  Admitted for syncope, suspected due to orthostatic changes versus vasovagal, bilateral first toe fracture, facial injuries.   Assessment & Plan:  Principal Problem:   Syncope and collapse Active Problems:   Dehydration   Unspecified fracture of right foot, initial encounter for closed fracture   Other fracture of left foot, initial encounter for closed fracture   Fall at home, initial encounter   Hypokalemia   Acute prerenal azotemia   Essential hypertension   Hyperlipidemia   Allergic rhinitis   Mild intermittent asthma   Urinary incontinence   Nondisplaced fracture of proximal phalanx of left great toe, initial encounter for closed fracture   Nondisplaced fracture of proximal phalanx of right great toe, initial encounter for closed fracture   Syncope and collapse: In the context of profuse diarrhea 2 to 3 days prior to admission, suspected intravascular volume depletion, related orthostatic changes or vasovagal. No orthostatics done on admission, negative today. Telemetry shows sinus rhythm without arrhythmias. 2D echo shows LVEF 60-65%, no aortic stenosis. CT head, C-spine and maxillofacial without acute injuries.  Some soft tissue swelling around left eye. Therapies have evaluated and recommend home health PT and OT. Patient has been counseled that she should not drive x 6 months and she verbalizes  understanding.  Bilateral great toe proximal phalanx fracture and right second metatarsal head fracture: Consulted orthopedics and their plan of care note appreciated No acute surgical intervention, WBAT BLE in postop shoes with walker and 1-2 person assist at all times. Discussed with Dr. Odis Hollingshead, outpatient follow-up in 1 week from discharge.  Hypokalemia: Replaced.  Hypomagnesemia: Replaced IV.  Follow in AM.  Essential hypertension: Soft blood pressures on initial arrival in ED as low as 100/89. Lisinopril and HCTZ which were briefly held have been resumed with blood pressure starting to creep up. Monitor closely.  Hyperlipidemia: Continue rosuvastatin.  Asthma, controlled: Patient states that she is not on any inhalers or nebulizers and insists on discontinuing all those medications.  Alpha gal syndrome: Extensive history.  Appears complicated case.  Followed at [facility].  Per patient report, prior to this being diagnosed about a year or so ago, she had more frequent episodes of syncope related to this.  Since then she has made significant dietary changes, excluded mammal products from her intake with overall improvement.  On the weekend prior to admission, she tried corn which was followed by 2 days of profuse diarrhea, eventually stopped after taking a bottle of Imodium AD.  After this episode, felt weak and unsteady.  She reports that now she is developing recurrence of allergy/alpha-gal syndrome even with non mammal products.  Continue as needed Benadryl and EpiPen for anaphylaxis.  Outpatient follow-up.  Anxiety and depression: Continue home Prozac.  Anemia and thrombocytopenia: Anemia may be dilutional.  Thrombocytopenia is mild.  Follow CBC in AM.  Body mass index is 47.55 kg/m./Very morbid obesity: Complicates care.  Outpatient  follow-up.   ACP Documents: None present. DVT prophylaxis: SCDs Start: 03/10/23 2031     Code Status: Full Code:  Family  Communication: None at beds right. Disposition:  Status is: Observation The patient remains OBS appropriate and will d/c before 2 midnights.  Hopeful discharge home tomorrow.     Consultants:     Procedures:     Antimicrobials:      Subjective:  Seen this morning.  Stated that pain and swelling in her feet were better and feet were fitting better in the shoes.  Sore all over.  Rest of history as noted above.  Objective:   Vitals:   03/10/23 2215 03/11/23 0447 03/11/23 0746 03/11/23 1240  BP: (!) 135/57 (!) 126/50  (!) 141/67  Pulse: 63 65  66  Resp: 18 17  18   Temp: 98.9 F (37.2 C) 98.3 F (36.8 C)  98.3 F (36.8 C)  TempSrc: Oral Oral  Oral  SpO2: 96% 94% 97% 99%  Weight:      Height:        General exam: Elderly female, moderately built and obese lying comfortably propped up in bed without distress. HEENT: Mild left periorbital swelling but able to open eyes.  No subconjunctival hemorrhage.  PERTLA. Respiratory system: Clear to auscultation. Respiratory effort normal. Cardiovascular system: S1 & S2 heard, RRR. No JVD, murmurs, rubs, gallops or clicks. No pedal edema.  Telemetry personally reviewed: Sinus rhythm. Gastrointestinal system: Abdomen is nondistended, soft and nontender. No organomegaly or masses felt. Normal bowel sounds heard. Central nervous system: Alert and oriented. No focal neurological deficits. Extremities: Symmetric 5 x 5 power.  Hemorrhagic blister on the dorsum of right first toe.  Both first toe and distal fourth foot with some expected bruising.  Has bilateral postop shoes. Skin: No rashes, lesions or ulcers Psychiatry: Judgement and insight appear normal. Mood & affect appropriate.     Data Reviewed:   I have personally reviewed following labs and imaging studies   CBC: Recent Labs  Lab 03/10/23 1549 03/11/23 0431  WBC 10.1 7.5  NEUTROABS  --  4.4  HGB 11.9* 10.7*  HCT 36.6 33.3*  MCV 91.5 93.0  PLT 169 149*    Basic  Metabolic Panel: Recent Labs  Lab 03/10/23 1549 03/11/23 0431  NA 138 137  K 3.4* 3.6  CL 103 105  CO2 27 24  GLUCOSE 116* 105*  BUN 18 16  CREATININE 0.65 0.62  CALCIUM 8.8* 8.4*  MG 1.5* 1.6*    Liver Function Tests: Recent Labs  Lab 03/11/23 0431  AST 23  ALT 16  ALKPHOS 40  BILITOT 0.9  PROT 5.4*  ALBUMIN 3.0*    CBG: Recent Labs  Lab 03/10/23 1632  GLUCAP 114*    Microbiology Studies:  No results found for this or any previous visit (from the past 240 hour(s)).  Radiology Studies:  ECHOCARDIOGRAM COMPLETE  Result Date: 03/11/2023    ECHOCARDIOGRAM REPORT   Patient Name:   KHALEAH DUER Date of Exam: 03/11/2023 Medical Rec #:  [id number]       Height:       64.0 in Accession #:    [id number]      Weight:       277.0 lb Date of Birth:  [birth year]       BSA:          2.247 m Patient Age:    76 years        BP:  126/50 mmHg Patient Gender: F               HR:           70 bpm. Exam Location:  Inpatient Procedure: 2D Echo, Color Doppler and Cardiac Doppler Indications:    Syncope  History:        Patient has no prior history of Echocardiogram examinations.                 Risk Factors:Hypertension.  Sonographer:    Darlys Gales Referring Phys: [phone] Eilan Mcinerny D Devlin Brink IMPRESSIONS  1. Left ventricular ejection fraction, by estimation, is 60 to 65%. The left ventricle has normal function. The left ventricle has no regional wall motion abnormalities. Left ventricular diastolic parameters were normal.  2. Right ventricular systolic function is normal. The right ventricular size is normal. Tricuspid regurgitation signal is inadequate for assessing PA pressure.  3. The mitral valve is normal in structure. Trivial mitral valve regurgitation.  4. The aortic valve is tricuspid. Aortic valve regurgitation is not visualized. No aortic stenosis is present.  5. The inferior vena cava is normal in size with greater than 50% respiratory variability, suggesting right atrial pressure of  3 mmHg. Comparison(s): No prior Echocardiogram. FINDINGS  Left Ventricle: Left ventricular ejection fraction, by estimation, is 60 to 65%. The left ventricle has normal function. The left ventricle has no regional wall motion abnormalities. The left ventricular internal cavity size was normal in size. There is  no left ventricular hypertrophy. Left ventricular diastolic parameters were normal. Right Ventricle: The right ventricular size is normal. No increase in right ventricular wall thickness. Right ventricular systolic function is normal. Tricuspid regurgitation signal is inadequate for assessing PA pressure. Left Atrium: Left atrial size was normal in size. Right Atrium: Right atrial size was normal in size. Pericardium: There is no evidence of pericardial effusion. Mitral Valve: The mitral valve is normal in structure. Trivial mitral valve regurgitation. Tricuspid Valve: The tricuspid valve is normal in structure. Tricuspid valve regurgitation is trivial. Aortic Valve: The aortic valve is tricuspid. Aortic valve regurgitation is not visualized. No aortic stenosis is present. Aortic valve mean gradient measures 7.0 mmHg. Aortic valve peak gradient measures 11.3 mmHg. Aortic valve area, by VTI measures 2.11  cm. Pulmonic Valve: The pulmonic valve was not well visualized. Pulmonic valve regurgitation is trivial. Aorta: The aortic root is normal in size and structure. Venous: The inferior vena cava is normal in size with greater than 50% respiratory variability, suggesting right atrial pressure of 3 mmHg. IAS/Shunts: The atrial septum is grossly normal.  LEFT VENTRICLE PLAX 2D LVIDd:         4.70 cm   Diastology LVIDs:         3.50 cm   LV e' medial:    9.25 cm/s LV PW:         1.00 cm   LV E/e' medial:  13.9 LV IVS:        0.90 cm   LV e' lateral:   10.20 cm/s LVOT diam:     1.80 cm   LV E/e' lateral: 12.6 LV SV:         85 LV SV Index:   38 LVOT Area:     2.54 cm  RIGHT VENTRICLE RV S prime:     12.30 cm/s  TAPSE (M-mode): 3.3 cm LEFT ATRIUM             Index        RIGHT  ATRIUM           Index LA Vol (A2C):   45.4 ml 20.21 ml/m  RA Area:     13.50 cm LA Vol (A4C):   47.0 ml 20.92 ml/m  RA Volume:   33.90 ml  15.09 ml/m LA Biplane Vol: 49.5 ml 22.03 ml/m  AORTIC VALVE AV Area (Vmax):    2.18 cm AV Area (Vmean):   2.06 cm AV Area (VTI):     2.11 cm AV Vmax:           168.00 cm/s AV Vmean:          126.000 cm/s AV VTI:            0.402 m AV Peak Grad:      11.3 mmHg AV Mean Grad:      7.0 mmHg LVOT Vmax:         144.00 cm/s LVOT Vmean:        102.000 cm/s LVOT VTI:          0.334 m LVOT/AV VTI ratio: 0.83  AORTA Ao Root diam: 3.00 cm Ao Asc diam:  3.10 cm MITRAL VALVE MV Area (PHT): 4.80 cm     SHUNTS MV Decel Time: 158 msec     Systemic VTI:  0.33 m MV E velocity: 129.00 cm/s  Systemic Diam: 1.80 cm MV A velocity: 135.00 cm/s MV E/A ratio:  0.96 Laurance Flatten MD Electronically signed by Laurance Flatten MD Signature Date/Time: 03/11/2023/12:58:50 PM    Final    DG Chest Port 1 View  Result Date: 03/10/2023 CLINICAL DATA:  Syncope. EXAM: PORTABLE CHEST 1 VIEW COMPARISON:  Chest radiograph dated 01/24/2014. FINDINGS: No focal consolidation, pleural effusion, or pneumothorax. The cardiac silhouette is within normal limits. Atherosclerotic calcification of the aortic arch. No acute osseous pathology. IMPRESSION: No active disease. Electronically Signed   By: Elgie Collard M.D.   On: 03/10/2023 21:21   DG Foot 2 Views Right  Result Date: 03/10/2023 CLINICAL DATA:  Pain post fall. EXAM: RIGHT FOOT - 2 VIEW COMPARISON:  None Available. FINDINGS: There is an oblique mildly comminuted fracture of the right proximal first phalanx with extension to the metatarsophalangeal joint. There is a suspected angulated fracture of the head of the second metatarsal bone, which is poorly visualized due to obliquity. IMPRESSION: 1. Oblique mildly comminuted fracture of the right proximal first phalanx with extension to  the metatarsophalangeal joint. 2. Suspected angulated fracture of the head of the second metatarsal bone, which is poorly visualized due to obliquity. Electronically Signed   By: Ted Mcalpine M.D.   On: 03/10/2023 17:17   DG Pelvis 1-2 Views  Result Date: 03/10/2023 CLINICAL DATA:  Trauma, fall EXAM: PELVIS - 1-2 VIEW COMPARISON:  Abdomen radiographs done on 12/10/2008 FINDINGS: No recent fracture or dislocation is seen. There is previous internal fixation in the right femur. Smooth marginated calcifications are noted adjacent to the greater trochanter of proximal right femur, possibly residual from previous trauma and previous surgery. IMPRESSION: No acute findings are seen in pelvis. Electronically Signed   By: Ernie Avena M.D.   On: 03/10/2023 17:15   DG Foot 2 Views Left  Result Date: 03/10/2023 CLINICAL DATA:  Trauma, fall EXAM: LEFT FOOT - 2 VIEW COMPARISON:  08/18/2018 FINDINGS: There is radiolucent line in the medial aspect of base of proximal phalanx of left big toe. There is also possible break in the cortical margin in the lateral aspect of base of proximal phalanx  of left big toe. Deformity in the medial aspect of distal first metatarsal may suggest previous osteotomy. Osteopenia. Soft tissue swelling is noted over the dorsum. There is flattening of plantar arch. IMPRESSION: Radiolucent lines are noted in the medial and lateral aspects of base of proximal phalanx of left big toe suggesting recent undisplaced fractures. Electronically Signed   By: Ernie Avena M.D.   On: 03/10/2023 17:14   CT Head Wo Contrast  Result Date: 03/10/2023 CLINICAL DATA:  Loss of consciousness last night with fall and trauma to the head and face. EXAM: CT HEAD WITHOUT CONTRAST CT MAXILLOFACIAL WITHOUT CONTRAST CT CERVICAL SPINE WITHOUT CONTRAST TECHNIQUE: Multidetector CT imaging of the head, cervical spine, and maxillofacial structures were performed using the standard protocol without  intravenous contrast. Multiplanar CT image reconstructions of the cervical spine and maxillofacial structures were also generated. RADIATION DOSE REDUCTION: This exam was performed according to the departmental dose-optimization program which includes automated exposure control, adjustment of the mA and/or kV according to patient size and/or use of iterative reconstruction technique. COMPARISON:  None Available. FINDINGS: CT HEAD FINDINGS Brain: Previous right retro tympanic surgery. Wall down mastoidectomy. The brain itself appears normal. No evidence of atrophy, stroke, mass, hemorrhage, hydrocephalus or extra-axial collection. Vascular: There is atherosclerotic calcification of the major vessels at the base of the brain. Skull: No acute skull fracture. Other: None CT MAXILLOFACIAL FINDINGS Osseous: No acute facial fracture. Previous right mastoidectomy and occipital craniectomy. No acute traumatic finding in that region. Orbits: No orbital injury. Sinuses: No traumatic sinus fluid. Previous functional endoscopic sinus surgery. Soft tissues: Soft tissue swelling of the superficial periorbital soft tissues and forehead region. CT CERVICAL SPINE FINDINGS Alignment: No traumatic malalignment. Skull base and vertebrae: No regional fracture. Soft tissues and spinal canal: No traumatic soft tissue injury. Disc levels: The foramen magnum is widely patent. There is ordinary mild osteoarthritis of the C1-2 articulation but no encroachment upon the neural structures. C2-3: Facet osteoarthritis on the left. Mild left foraminal narrowing. C3-4: Facet osteoarthritis on the left. Mild bony foraminal narrowing on the left. C4-5: Facet osteoarthritis on the right.  No stenosis. C5-6: Spondylosis with endplate osteophytes in some calcified protruding disc material. No apparent compressive narrowing of the canal. Mild bony foraminal narrowing left more than right. C6-7: Spondylosis with bony foraminal narrowing on both sides.  C7-T1: Facet osteoarthritis with 3 mm of degenerative anterolisthesis. No canal stenosis. Bilateral foraminal narrowing. Upper chest: Negative Other: None IMPRESSION: HEAD CT: No acute or traumatic finding. Previous right mastoidectomy and occipital craniectomy. MAXILLOFACIAL CT: No acute facial fracture. Superficial soft tissue swelling of the periorbital soft tissues and forehead region. CERVICAL SPINE CT: No acute or traumatic finding. Chronic degenerative changes as outlined above. Electronically Signed   By: Paulina Fusi M.D.   On: 03/10/2023 16:55   CT Maxillofacial Wo Contrast  Result Date: 03/10/2023 CLINICAL DATA:  Loss of consciousness last night with fall and trauma to the head and face. EXAM: CT HEAD WITHOUT CONTRAST CT MAXILLOFACIAL WITHOUT CONTRAST CT CERVICAL SPINE WITHOUT CONTRAST TECHNIQUE: Multidetector CT imaging of the head, cervical spine, and maxillofacial structures were performed using the standard protocol without intravenous contrast. Multiplanar CT image reconstructions of the cervical spine and maxillofacial structures were also generated. RADIATION DOSE REDUCTION: This exam was performed according to the departmental dose-optimization program which includes automated exposure control, adjustment of the mA and/or kV according to patient size and/or use of iterative reconstruction technique. COMPARISON:  None Available. FINDINGS: CT HEAD FINDINGS  Brain: Previous right retro tympanic surgery. Wall down mastoidectomy. The brain itself appears normal. No evidence of atrophy, stroke, mass, hemorrhage, hydrocephalus or extra-axial collection. Vascular: There is atherosclerotic calcification of the major vessels at the base of the brain. Skull: No acute skull fracture. Other: None CT MAXILLOFACIAL FINDINGS Osseous: No acute facial fracture. Previous right mastoidectomy and occipital craniectomy. No acute traumatic finding in that region. Orbits: No orbital injury. Sinuses: No traumatic sinus  fluid. Previous functional endoscopic sinus surgery. Soft tissues: Soft tissue swelling of the superficial periorbital soft tissues and forehead region. CT CERVICAL SPINE FINDINGS Alignment: No traumatic malalignment. Skull base and vertebrae: No regional fracture. Soft tissues and spinal canal: No traumatic soft tissue injury. Disc levels: The foramen magnum is widely patent. There is ordinary mild osteoarthritis of the C1-2 articulation but no encroachment upon the neural structures. C2-3: Facet osteoarthritis on the left. Mild left foraminal narrowing. C3-4: Facet osteoarthritis on the left. Mild bony foraminal narrowing on the left. C4-5: Facet osteoarthritis on the right.  No stenosis. C5-6: Spondylosis with endplate osteophytes in some calcified protruding disc material. No apparent compressive narrowing of the canal. Mild bony foraminal narrowing left more than right. C6-7: Spondylosis with bony foraminal narrowing on both sides. C7-T1: Facet osteoarthritis with 3 mm of degenerative anterolisthesis. No canal stenosis. Bilateral foraminal narrowing. Upper chest: Negative Other: None IMPRESSION: HEAD CT: No acute or traumatic finding. Previous right mastoidectomy and occipital craniectomy. MAXILLOFACIAL CT: No acute facial fracture. Superficial soft tissue swelling of the periorbital soft tissues and forehead region. CERVICAL SPINE CT: No acute or traumatic finding. Chronic degenerative changes as outlined above. Electronically Signed   By: Paulina Fusi M.D.   On: 03/10/2023 16:55   CT Cervical Spine Wo Contrast  Result Date: 03/10/2023 CLINICAL DATA:  Loss of consciousness last night with fall and trauma to the head and face. EXAM: CT HEAD WITHOUT CONTRAST CT MAXILLOFACIAL WITHOUT CONTRAST CT CERVICAL SPINE WITHOUT CONTRAST TECHNIQUE: Multidetector CT imaging of the head, cervical spine, and maxillofacial structures were performed using the standard protocol without intravenous contrast. Multiplanar CT  image reconstructions of the cervical spine and maxillofacial structures were also generated. RADIATION DOSE REDUCTION: This exam was performed according to the departmental dose-optimization program which includes automated exposure control, adjustment of the mA and/or kV according to patient size and/or use of iterative reconstruction technique. COMPARISON:  None Available. FINDINGS: CT HEAD FINDINGS Brain: Previous right retro tympanic surgery. Wall down mastoidectomy. The brain itself appears normal. No evidence of atrophy, stroke, mass, hemorrhage, hydrocephalus or extra-axial collection. Vascular: There is atherosclerotic calcification of the major vessels at the base of the brain. Skull: No acute skull fracture. Other: None CT MAXILLOFACIAL FINDINGS Osseous: No acute facial fracture. Previous right mastoidectomy and occipital craniectomy. No acute traumatic finding in that region. Orbits: No orbital injury. Sinuses: No traumatic sinus fluid. Previous functional endoscopic sinus surgery. Soft tissues: Soft tissue swelling of the superficial periorbital soft tissues and forehead region. CT CERVICAL SPINE FINDINGS Alignment: No traumatic malalignment. Skull base and vertebrae: No regional fracture. Soft tissues and spinal canal: No traumatic soft tissue injury. Disc levels: The foramen magnum is widely patent. There is ordinary mild osteoarthritis of the C1-2 articulation but no encroachment upon the neural structures. C2-3: Facet osteoarthritis on the left. Mild left foraminal narrowing. C3-4: Facet osteoarthritis on the left. Mild bony foraminal narrowing on the left. C4-5: Facet osteoarthritis on the right.  No stenosis. C5-6: Spondylosis with endplate osteophytes in some calcified protruding disc  material. No apparent compressive narrowing of the canal. Mild bony foraminal narrowing left more than right. C6-7: Spondylosis with bony foraminal narrowing on both sides. C7-T1: Facet osteoarthritis with 3 mm of  degenerative anterolisthesis. No canal stenosis. Bilateral foraminal narrowing. Upper chest: Negative Other: None IMPRESSION: HEAD CT: No acute or traumatic finding. Previous right mastoidectomy and occipital craniectomy. MAXILLOFACIAL CT: No acute facial fracture. Superficial soft tissue swelling of the periorbital soft tissues and forehead region. CERVICAL SPINE CT: No acute or traumatic finding. Chronic degenerative changes as outlined above. Electronically Signed   By: Paulina Fusi M.D.   On: 03/10/2023 16:55    Scheduled Meds:    FLUoxetine  20 mg Oral Daily   lisinopril  10 mg Oral Daily   And   hydrochlorothiazide  12.5 mg Oral Daily   loratadine  10 mg Oral Daily   magnesium gluconate  500 mg Oral QHS   mirabegron ER  50 mg Oral QHS   rosuvastatin  5 mg Oral Daily    Continuous Infusions:     LOS: 0 days     Marcellus Scott, MD,  FACP, FHM, SFHM, [facility], [facility]   [facility] [place]     To contact the attending provider between 7A-7P or the covering provider during after hours 7P-7A, please log into the web site [email or web address] and access using universal [place] password for that web site. If you do not have the password, please call the hospital operator.  03/11/2023, 5:09 PM

## 2023-03-11 NOTE — Care Plan (Signed)
Orthopaedic Surgery Plan of Care Note   -history and imaging reviewed with primary team (hospitalist) -pt has bilateral great toe proximal phalanx fx and right 2nd met head fx -no acute surgical intervention -PMH includes chronic diarrhea, essential pretension, hyperlipidemia, allergic rhinitis, who is admitted to [facility] on 03/10/2023 with syncope after presenting from home to [facility] ED complaining of syncope -WBAT BLE in postop shoes with walker and 1-2 person assist at all times -PT/OT to work with patient during admission -full consult note to follow   Netta Cedars, MD Orthopaedic Surgery [facility]

## 2023-03-11 NOTE — Evaluation (Addendum)
Physical Therapy Evaluation Patient Details Name: [name] MRN: [id number] DOB: 1945-11-24 Today's Date: 03/11/2023  History of Present Illness  77 y.o. female with medical history significant for chronic diarrhea, HTN, hyperlipidemia, , Bil TKAs, fibromyalgia, allergic rhinitis, who is admitted to [facility] on 03/10/2023 with syncope episode resulting in fall as well as fractures of the bilateral great toes as well as fracture of the right second metatarsal  Clinical Impression  Pt admitted with above diagnosis.  Pt currently with functional limitations due to the deficits listed below (see PT Problem List). Pt will benefit from acute skilled PT to increase their independence and safety with mobility to allow discharge.  Pt assisted with standing and obtained orthostatic vitals.  Pt then assisted to [facility] due to urinary urgency.  Pt agreeable to attempt ambulating however only tolerating short distance in room due to bil feet pain.  Pt reports she lives alone and her daughter can check on her but she also works.         03/11/23 1027  Vital Signs   Orthostatic Vitals  Orthostatic Lying    148/69   68  Orthostatic Sitting   159/71   67  Orthostatic Standing at 0 minutes   (!) 151/138 (weight bearing on arms with walker due to pain)   74  Orthostatic Standing at 3 minutes    (unable to capture at this time as pt had urinary urgency)     Recommendations for follow up therapy are one component of a multi-disciplinary discharge planning process, led by the attending physician.  Recommendations may be updated based on patient status, additional functional criteria and insurance authorization.  Follow Up Recommendations       Assistance Recommended at Discharge PRN  Patient can return home with the following  Help with stairs or ramp for entrance;Assist for transportation;Assistance with cooking/housework    Equipment Recommendations Rolling walker (2 wheels)   Recommendations for Other Services       Functional Status Assessment Patient has had a recent decline in their functional status and demonstrates the ability to make significant improvements in function in a reasonable and predictable amount of time.     Precautions / Restrictions Precautions Precautions: Fall Restrictions Weight Bearing Restrictions: No Other Position/Activity Restrictions: WBAT in bil post op shoes with walker per ortho note      Mobility  Bed Mobility Overal bed mobility: Modified Independent                  Transfers Overall transfer level: Needs assistance Equipment used: Rolling walker (2 wheels) Transfers: Sit to/from Stand, Bed to chair/wheelchair/BSC Sit to Stand: Min guard   Step pivot transfers: Min guard       General transfer comment: performed orthostatics and then assisted to [facility] due to urinary urgency    Ambulation/Gait Ambulation/Gait assistance: Min guard Gait Distance (Feet): 10 Feet Assistive device: Rolling walker (2 wheels) Gait Pattern/deviations: Step-through pattern, Decreased stride length Gait velocity: decr     General Gait Details: verbal cues for RW positioning, step length; pt reports pain in feet however better then yesterday (wearing post op shoes )  Stairs            Wheelchair Mobility    Modified Rankin (Stroke Patients Only)       Balance Overall balance assessment: Needs assistance         Standing balance support: Bilateral upper extremity supported, Reliant on assistive device for balance, During functional activity  Standing balance comment: required UE support for pain in bil feet                             Pertinent Vitals/Pain Pain Assessment Pain Assessment: 0-10 Pain Score: 3  Pain Location: bil feet Pain Descriptors / Indicators: Sore, Discomfort Pain Intervention(s): Monitored during session, Repositioned    Home Living Family/patient expects to be  discharged to:: Private residence Living Arrangements: Alone Available Help at Discharge: Family;Available PRN/intermittently (daughter but she works) Type of Home: House Home Access: Stairs to enter Entrance Stairs-Rails: None Secretary/administrator of Steps: 1+1   Home Layout: One level Home Equipment: None      Prior Function Prior Level of Function : Independent/Modified Independent                     Hand Dominance        Extremity/Trunk Assessment        Lower Extremity Assessment Lower Extremity Assessment: Overall WFL for tasks assessed    Cervical / Trunk Assessment Cervical / Trunk Assessment: Normal  Communication   Communication: No difficulties  Cognition Arousal/Alertness: Awake/alert Behavior During Therapy: WFL for tasks assessed/performed Overall Cognitive Status: Within Functional Limits for tasks assessed                                          General Comments      Exercises     Assessment/Plan    PT Assessment Patient needs continued PT services  PT Problem List Decreased strength;Decreased activity tolerance;Decreased mobility;Decreased knowledge of use of DME;Obesity       PT Treatment Interventions DME instruction;Gait training;Balance training;Therapeutic exercise;Functional mobility training;Therapeutic activities;Patient/family education    PT Goals (Current goals can be found in the Care Plan section)  Acute Rehab PT Goals PT Goal Formulation: With patient Time For Goal Achievement: 03/25/23 Potential to Achieve Goals: Good    Frequency Min 1X/week     Co-evaluation               AM-PAC PT "6 Clicks" Mobility  Outcome Measure Help needed turning from your back to your side while in a flat bed without using bedrails?: A Little Help needed moving from lying on your back to sitting on the side of a flat bed without using bedrails?: A Little Help needed moving to and from a bed to a chair  (including a wheelchair)?: A Little Help needed standing up from a chair using your arms (e.g., wheelchair or bedside chair)?: A Little Help needed to walk in hospital room?: A Little Help needed climbing 3-5 steps with a railing? : A Little 6 Click Score: 18    End of Session Equipment Utilized During Treatment: Gait belt Activity Tolerance: Patient tolerated treatment well Patient left: in bed;with call bell/phone within reach;with nursing/sitter in room Nurse Communication: Mobility status PT Visit Diagnosis: Difficulty in walking, not elsewhere classified (R26.2)    Time: 9528-4132 PT Time Calculation (min) (ACUTE ONLY): 25 min   Charges:   PT Evaluation $PT Eval Low Complexity: 1 Low PT Treatments $Gait Training: 8-22 mins      Paulino Door, DPT Physical Therapist Acute Rehabilitation Services Office: [phone]   [name] 03/11/2023, 12:46 PM

## 2023-03-11 NOTE — Evaluation (Signed)
Occupational Therapy Evaluation Patient Details Name: [name] MRN: [id number] DOB: 09/28/1945 Today's Date: 03/11/2023   History of Present Illness 77 y.o. female with medical history significant for chronic diarrhea, HTN, hyperlipidemia, , Bil TKAs, fibromyalgia, allergic rhinitis, who is admitted to [facility] on 03/10/2023 with syncope episode resulting in fall as well as fractures of the bilateral great toes as well as fracture of the right second metatarsal   Clinical Impression   Pt is typically independent and lives alone. Presents with B foot pain and decreased standing balance. She needs up to min assist for OOB with RW and set up to min assist for ADLs. Educated pt to take post op shoes off in bed as she is developing sore heels from pressure of the shoes. Pt will need a RW for home. She has shower equipment and her toilet is handicapped height. Her daughter can assist at home. Recommending HHOT.      Recommendations for follow up therapy are one component of a multi-disciplinary discharge planning process, led by the attending physician.  Recommendations may be updated based on patient status, additional functional criteria and insurance authorization.   Assistance Recommended at Discharge Intermittent Supervision/Assistance  Patient can return home with the following A little help with bathing/dressing/bathroom;Assistance with cooking/housework;Assist for transportation;Help with stairs or ramp for entrance;A little help with walking and/or transfers    Functional Status Assessment  Patient has had a recent decline in their functional status and demonstrates the ability to make significant improvements in function in a reasonable and predictable amount of time.  Equipment Recommendations  Other (comment) (RW)    Recommendations for Other Services       Precautions / Restrictions Precautions Precautions: Fall Required Braces or Orthoses: Other Brace Other  Brace: B post op shoes Restrictions Weight Bearing Restrictions: No Other Position/Activity Restrictions: WBAT in bil post op shoes with walker per ortho note      Mobility Bed Mobility Overal bed mobility: Modified Independent                  Transfers Overall transfer level: Needs assistance Equipment used: Rolling walker (2 wheels) Transfers: Sit to/from Stand, Bed to chair/wheelchair/BSC Sit to Stand: Min assist     Step pivot transfers: Min guard     General transfer comment: assist to rise and steady, cues for hand placement, increased time      Balance Overall balance assessment: Needs assistance   Sitting balance-Leahy Scale: Good     Standing balance support: Bilateral upper extremity supported, Reliant on assistive device for balance, During functional activity Standing balance-Leahy Scale: Poor Standing balance comment: can release walker in static stand to manage panties                           ADL either performed or assessed with clinical judgement   ADL Overall ADL's : Needs assistance/impaired Eating/Feeding: Independent   Grooming: Set up;Sitting   Upper Body Bathing: Set up;Sitting   Lower Body Bathing: Minimal assistance;Sit to/from stand   Upper Body Dressing : Set up;Sitting   Lower Body Dressing: Minimal assistance;Sit to/from stand   Toilet Transfer: Min guard;Stand-pivot;BSC/3in1;Rolling walker (2 wheels)   Toileting- Architect and Hygiene: Min guard;Sit to/from stand               Vision Baseline Vision/History: 0 No visual deficits       Perception     Praxis  Pertinent Vitals/Pain Pain Assessment Pain Assessment: Faces Faces Pain Scale: Hurts little more Pain Location: back of heels Pain Descriptors / Indicators: Sore, Discomfort Pain Intervention(s): Other (comment) (removed post op shoes in bed)     Hand Dominance Right   Extremity/Trunk Assessment Upper Extremity  Assessment Upper Extremity Assessment: Overall WFL for tasks assessed   Lower Extremity Assessment Lower Extremity Assessment: Defer to PT evaluation   Cervical / Trunk Assessment Cervical / Trunk Assessment: Normal   Communication Communication Communication: No difficulties   Cognition Arousal/Alertness: Awake/alert Behavior During Therapy: WFL for tasks assessed/performed Overall Cognitive Status: Within Functional Limits for tasks assessed                                       General Comments       Exercises     Shoulder Instructions      Home Living Family/patient expects to be discharged to:: Private residence Living Arrangements: Alone Available Help at Discharge: Family;Available PRN/intermittently Type of Home: Other(Comment) (townhouse) Home Access: Stairs to enter Entrance Stairs-Number of Steps: 1+1 Entrance Stairs-Rails: None Home Layout: One level     Bathroom Shower/Tub: Producer, television/film/video: Handicapped height     Home Equipment: Information systems manager;Shower seat - built in;Grab bars - toilet;Hand held shower head          Prior Functioning/Environment Prior Level of Function : Independent/Modified Independent                        OT Problem List: Impaired balance (sitting and/or standing);Pain;Decreased strength;Decreased knowledge of use of DME or AE      OT Treatment/Interventions: Self-care/ADL training;DME and/or AE instruction;Balance training;Patient/family education;Therapeutic activities    OT Goals(Current goals can be found in the care plan section) Acute Rehab OT Goals OT Goal Formulation: With patient Time For Goal Achievement: 03/25/23 Potential to Achieve Goals: Good ADL Goals Pt Will Perform Grooming: with modified independence;standing Pt Will Perform Lower Body Dressing: with modified independence;sit to/from stand Pt Will Transfer to Toilet: with modified independence;ambulating;bedside  commode Pt Will Perform Toileting - Clothing Manipulation and hygiene: with modified independence;sit to/from stand  OT Frequency: Min 1X/week    Co-evaluation              AM-PAC OT "6 Clicks" Daily Activity     Outcome Measure Help from another person eating meals?: None Help from another person taking care of personal grooming?: A Little Help from another person toileting, which includes using toliet, bedpan, or urinal?: A Little Help from another person bathing (including washing, rinsing, drying)?: A Little Help from another person to put on and taking off regular upper body clothing?: A Little Help from another person to put on and taking off regular lower body clothing?: A Little 6 Click Score: 19   End of Session Equipment Utilized During Treatment: Gait belt;Rolling walker (2 wheels)  Activity Tolerance: Patient tolerated treatment well Patient left: in bed;with call bell/phone within reach;with bed alarm set  OT Visit Diagnosis: Unsteadiness on feet (R26.81);Other abnormalities of gait and mobility (R26.89);Muscle weakness (generalized) (M62.81);Pain                Time: 0865-7846 OT Time Calculation (min): 36 min Charges:  OT General Charges $OT Visit: 1 Visit OT Evaluation $OT Eval Moderate Complexity: 1 Mod OT Treatments $Self Care/Home Management : 8-22 mins  Raynelle Fanning  M, OTR/L Acute Rehabilitation Services Office: [phone]  Evern Bio 03/11/2023, 3:57 PM

## 2023-03-12 DIAGNOSIS — R29898 Other symptoms and signs involving the musculoskeletal system: Secondary | ICD-10-CM | POA: Diagnosis not present

## 2023-03-12 DIAGNOSIS — I10 Essential (primary) hypertension: Secondary | ICD-10-CM | POA: Diagnosis not present

## 2023-03-12 DIAGNOSIS — Z7401 Bed confinement status: Secondary | ICD-10-CM | POA: Diagnosis not present

## 2023-03-12 DIAGNOSIS — R55 Syncope and collapse: Secondary | ICD-10-CM | POA: Diagnosis not present

## 2023-03-12 LAB — BASIC METABOLIC PANEL
Anion gap: 8 (ref 5–15)
BUN: 11 mg/dL (ref 8–23)
CO2: 24 mmol/L (ref 22–32)
Calcium: 8.4 mg/dL — ABNORMAL LOW (ref 8.9–10.3)
Chloride: 103 mmol/L (ref 98–111)
Creatinine, Ser: 0.53 mg/dL (ref 0.44–1.00)
GFR, Estimated: >60 mL/min (ref >60)
Glucose, Bld: 112 mg/dL — ABNORMAL HIGH (ref 70–99)
Potassium: 3.6 mmol/L (ref 3.5–5.1)
Sodium: 135 mmol/L (ref 135–145)

## 2023-03-12 LAB — CBC
HCT: 33.3 % — ABNORMAL LOW (ref 36.0–46.0)
Hemoglobin: 11 g/dL — ABNORMAL LOW (ref 12.0–15.0)
MCH: 30.4 pg (ref 26.0–34.0)
MCHC: 33 g/dL (ref 30.0–36.0)
MCV: 92 fL (ref 80.0–100.0)
Platelets: 146 10*3/uL — ABNORMAL LOW (ref 150–400)
RBC: 3.62 MIL/uL — ABNORMAL LOW (ref 3.87–5.11)
RDW: 13 % (ref 11.5–15.5)
WBC: 9.8 10*3/uL (ref 4.0–10.5)
nRBC: 0 % (ref 0.0–0.2)

## 2023-03-12 LAB — MAGNESIUM: Magnesium: 1.7 mg/dL (ref 1.7–2.4)

## 2023-03-12 MED ORDER — DIPHENHYDRAMINE HCL 25 MG PO CAPS: 50.0000 mg | ORAL_CAPSULE | Freq: Four times a day (QID) | ORAL | Status: AC | PRN

## 2023-03-12 MED ORDER — CETIRIZINE HCL 5 MG/5ML PO SOLN: 10.0000 mg | Freq: Every day | ORAL | Status: AC

## 2023-03-12 NOTE — Plan of Care (Signed)

## 2023-03-12 NOTE — Progress Notes (Signed)
Occupational Therapy Treatment Patient Details Name: [name] MRN: [id number] DOB: 1946-02-20 Today's Date: 03/12/2023   History of present illness 77 y.o. female with medical history significant for chronic diarrhea, HTN, hyperlipidemia, , Bil TKAs, fibromyalgia, allergic rhinitis, who is admitted to [facility] on 03/10/2023 with syncope episode resulting in fall as well as fractures of the bilateral great toes as well as fracture of the right second metatarsal   OT comments  Educated on compensatory strategies for ADL and functional mobility and strategies to reduce risk of falls. PT able to negotiate 1 step with S @ RW level. Educated pt on recommendation for using Homecare Services to assist after DC - pt verbalized understanding. Also educated on recommendation of fall alert home system. Continue to recommend follow up with HHOT. Pt very appreciative.    Recommendations for follow up therapy are one component of a multi-disciplinary discharge planning process, led by the attending physician.  Recommendations may be updated based on patient status, additional functional criteria and insurance authorization.    Assistance Recommended at Discharge Intermittent Supervision/Assistance  Patient can return home with the following  A little help with bathing/dressing/bathroom;Assistance with cooking/housework;Assist for transportation;Help with stairs or ramp for entrance;A little help with walking and/or transfers   Equipment Recommendations  Other (comment)    Recommendations for Other Services      Precautions / Restrictions Precautions Precautions: Fall Other Brace: B post op shoes Restrictions Weight Bearing Restrictions: No Other Position/Activity Restrictions: WBAT in bil post op shoes with walker per ortho note       Mobility Bed Mobility Overal bed mobility: Independent                  Transfers Overall transfer level: Modified independent Equipment  used: Rolling walker (2 wheels)                     Balance Overall balance assessment: Mild deficits observed, not formally tested                                         ADL either performed or assessed with clinical judgement   ADL                                         General ADL Comments: Able to complete bathing/dresisng with S; educated on use of reacher and AE; pt concerned about managing dog and steps; educated on fall precautions and copmesatory strategies; encouraged pt to have her neighbors assist with her dog until she is mobilizing better    Extremity/Trunk Assessment              Vision       Perception     Praxis      Cognition Arousal/Alertness: Awake/alert Behavior During Therapy: WFL for tasks assessed/performed Overall Cognitive Status: Within Functional Limits for tasks assessed                                          Exercises      Shoulder Instructions       General Comments Able to negotiate step goin up backwards with RLE then down with painful  LLE using RW only as pt does not have rails    Pertinent Vitals/ Pain       Pain Assessment Pain Assessment: Faces Faces Pain Scale: Hurts little more Pain Location: B feet; back Pain Descriptors / Indicators: Sore, Discomfort Pain Intervention(s): Limited activity within patient's tolerance  Home Living                                          Prior Functioning/Environment              Frequency  Min 1X/week        Progress Toward Goals  OT Goals(current goals can now be found in the care plan section)  Progress towards OT goals: Progressing toward goals  Acute Rehab OT Goals OT Goal Formulation: With patient Time For Goal Achievement: 03/25/23 Potential to Achieve Goals: Good ADL Goals Pt Will Perform Grooming: with modified independence;standing Pt Will Perform Lower Body Dressing: with  modified independence;sit to/from stand Pt Will Transfer to Toilet: with modified independence;ambulating;bedside commode Pt Will Perform Toileting - Clothing Manipulation and hygiene: with modified independence;sit to/from stand  Plan Discharge plan remains appropriate    Co-evaluation                 AM-PAC OT "6 Clicks" Daily Activity     Outcome Measure   Help from another person eating meals?: None Help from another person taking care of personal grooming?: A Little Help from another person toileting, which includes using toliet, bedpan, or urinal?: A Little Help from another person bathing (including washing, rinsing, drying)?: A Little Help from another person to put on and taking off regular upper body clothing?: A Little Help from another person to put on and taking off regular lower body clothing?: A Little 6 Click Score: 19    End of Session Equipment Utilized During Treatment: Gait belt;Rolling walker (2 wheels)  OT Visit Diagnosis: Unsteadiness on feet (R26.81);Other abnormalities of gait and mobility (R26.89);Muscle weakness (generalized) (M62.81);Pain   Activity Tolerance Patient tolerated treatment well   Patient Left in bed;with call bell/phone within reach   Nurse Communication Mobility status        Time: 1157 (1157)-1228 OT Time Calculation (min): 31 min  Charges: OT General Charges $OT Visit: 1 Visit OT Treatments $Self Care/Home Management : 23-37 mins  Luisa Dago, OT/L   Acute OT Clinical Specialist Acute Rehabilitation Services Pager [phone] Office [phone]   [facility] 03/12/2023, 12:35 PM

## 2023-03-12 NOTE — Hospital Course (Signed)
Physician Discharge Summary  [name] ZOX:[id number] DOB: 03-30-1946  PCP: Thana Ates, MD  Admitted from: *** Discharged to: ***  Admit date: 03/10/2023 Discharge date: 03/12/2023  Recommendations for Outpatient Follow-up:    Follow-up Information     Thana Ates, MD. Schedule an appointment as soon as possible for a visit in 1 week(s).   Specialty: Internal Medicine Why: To be seen with repeat labs (CBC & BMP). Contact information: [address] [employer] [address] [place] [address] [phone]         Netta Cedars, MD. Schedule an appointment as soon as possible for a visit in 1 week(s).   Specialty: Orthopedic Surgery Contact information: [address], [address] [place] [address] [phone]                  Home Health: Home Health Orders (From admission, onward)     Start     Ordered   03/12/23 1034  Home Health  At discharge       Question Answer Comment  To provide the following care/treatments PT   To provide the following care/treatments OT      03/12/23 1036   03/10/23 0000  Home Health       Question Answer Comment  To provide the following care/treatments PT   To provide the following care/treatments OT      03/10/23 1855             Equipment/Devices:     Durable Medical Equipment  (From admission, onward)           Start     Ordered   03/11/23 1305  For home use only DME Walker rolling  Once       Question Answer Comment  Walker: With 5 Inch Wheels   Patient needs a walker to treat with the following condition Difficulty walking      03/11/23 1304   03/10/23 0000  For home use only DME 4 wheeled rolling walker with seat       Question:  Patient needs a walker to treat with the following condition  Answer:  Bilateral great toe fractures   03/10/23 1738   03/10/23 0000  For home use only DME Walker rolling       Question Answer Comment  Walker: With 5 Inch Wheels   Patient needs a walker to  treat with the following condition Bilateral great toe fractures      03/10/23 1855             Discharge Condition: ***    Code Status: Full Code Diet recommendation:  Discharge Diet Orders (From admission, onward)     Start     Ordered   03/12/23 0000  Diet - low sodium heart healthy        03/12/23 1036             Discharge Diagnoses:  Principal Problem:   Syncope and collapse Active Problems:   Dehydration   Unspecified fracture of right foot, initial encounter for closed fracture   Other fracture of left foot, initial encounter for closed fracture   Fall at home, initial encounter   Hypokalemia   Acute prerenal azotemia   Essential hypertension   Hyperlipidemia   Allergic rhinitis   Mild intermittent asthma   Urinary incontinence   Nondisplaced fracture of proximal phalanx of left great toe, initial encounter for closed fracture   Nondisplaced fracture of proximal phalanx of right  great toe, initial encounter for closed fracture   Brief Summary: ***   Consultations: ***  Procedures: ***   Discharge Instructions  Discharge Instructions     Call MD for:   Complete by: As directed    Recurrent passing out or feeling like passing out.   Call MD for:  difficulty breathing, headache or visual disturbances   Complete by: As directed    Call MD for:  extreme fatigue   Complete by: As directed    Call MD for:  persistant dizziness or light-headedness   Complete by: As directed    Call MD for:  persistant nausea and vomiting   Complete by: As directed    Call MD for:  redness, tenderness, or signs of infection (pain, swelling, redness, odor or green/yellow discharge around incision site)   Complete by: As directed    Call MD for:  severe uncontrolled pain   Complete by: As directed    Call MD for:  temperature >100.4   Complete by: As directed    Diet - low sodium heart healthy   Complete by: As directed    Driving Restrictions   Complete by:  As directed    No driving for 6 months. Patient advised and verbalized understanding.   Face-to-face encounter (required for Medicare/Medicaid patients)   Complete by: As directed    I Tonette Lederer certify that this patient is under my care and that I, or a nurse practitioner or physician's assistant working with me, had a face-to-face encounter that meets the physician face-to-face encounter requirements with this patient on 03/10/2023. The encounter with the patient was in whole, or in part for the following medical condition(s) which is the primary reason for home health care (List medical condition): Multiple toe fractures with change in ambulation status   The encounter with the patient was in whole, or in part, for the following medical condition, which is the primary reason for home health care: Bilateral multiple toe fractures   I certify that, based on my findings, the following services are medically necessary home health services:  Physical therapy Nursing     Reason for Medically Necessary Home Health Services: Therapy- Instruction on use of Assistive Device for Ambulation on all Surfaces   My clinical findings support the need for the above services: Pain interferes with ambulation/mobility   Further, I certify that my clinical findings support that this patient is homebound due to:  Unsafe ambulation due to balance issues Pain interferes with ambulation/mobility     For home use only DME 4 wheeled rolling walker with seat   Complete by: As directed    Patient needs a walker to treat with the following condition: Bilateral great toe fractures   For home use only DME Walker rolling   Complete by: As directed    Walker: With 5 Inch Wheels   Patient needs a walker to treat with the following condition: Bilateral great toe fractures   Home Health   Complete by: As directed    To provide the following care/treatments:  PT OT     Increase activity slowly   Complete by: As  directed    Weight bearing as tolerated on both legs with the post op shoes.        Medication List     STOP taking these medications    cetirizine 10 MG tablet Commonly known as: ZYRTEC Replaced by: cetirizine HCl 5 MG/5ML Soln       TAKE  these medications    amoxicillin 500 MG capsule Commonly known as: AMOXIL Take 500 mg by mouth See admin instructions. Take 2,000 mg by mouth one hour prior to dental procedures   cetirizine HCl 5 MG/5ML Soln Commonly known as: Zyrtec Take 10 mLs (10 mg total) by mouth daily. 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 What changed: how much to take   EpiPen 2-Pak 0.3 mg/0.3 mL Soaj injection Generic drug: EPINEPHrine Inject 0.3 mg into the muscle as needed for anaphylaxis.   famotidine 20 MG tablet Commonly known as: PEPCID Take 20 mg by mouth 2 (two) times daily as needed (for allergic reactions (re: Alpha-Gal)).   FISH OIL PO Take 1,400 mg by mouth daily.   FLUoxetine 20 MG capsule Commonly known as: PROZAC Take 20 mg by mouth daily.   fluticasone 50 MCG/ACT nasal spray Commonly known as: FLONASE Place 1 spray into both nostrils daily.   lisinopril-hydrochlorothiazide 10-12.5 MG tablet Commonly known as: ZESTORETIC Take 1 tablet by mouth daily.   magnesium gluconate 500 MG tablet Commonly known as: MAGONATE Take 500 mg by mouth at bedtime.   multivitamin capsule Take 1 capsule by mouth every morning.   Myrbetriq 50 MG Tb24 tablet Generic drug: mirabegron ER Take 50 mg by mouth at bedtime.   NON FORMULARY Take 300 mg by mouth See admin instructions. Quercetin 300 mg Hypoallergenic, Delayed-Release, Vegetarian Capsules- Take 300 mg by mouth once a day ("Dication" brand??)   rosuvastatin 5 MG tablet Commonly known as: CRESTOR Take 5 mg by mouth  daily.   traMADol 50 MG tablet Commonly known as: ULTRAM Take 50 mg by mouth every 6 (six) hours as needed (for pain).   traZODone 50 MG tablet Commonly known as: DESYREL Take 50 mg by mouth at bedtime as needed for sleep.   tretinoin 0.1 % cream Commonly known as: RETIN-A Apply 1 application topically at bedtime.   TYLENOL 500 MG tablet Generic drug: acetaminophen Take 500-1,000 mg by mouth every 6 (six) hours as needed for mild pain or headache.   Tyrvaya 0.03 MG/ACT Soln Generic drug: Varenicline Tartrate Place 1 spray into both nostrils 2 (two) times daily.   VITAMIN B-12 PO Take 1 tablet by mouth daily.   VITAMIN C PO Take 1 tablet by mouth daily with breakfast.   Vitamin D-3 125 MCG (5000 UT) Tabs Take 5,000 Units by mouth daily.       Allergies  Allergen Reactions   Alpha-Gal Anaphylaxis, Hives, Diarrhea and Other (See Comments)    NOTHING that includes anything mammal-related or sourced   Corn-Containing Products Diarrhea and Nausea And Vomiting   Demerol [Meperidine] Nausea And Vomiting   Morphine Nausea And Vomiting and Other (See Comments)    PCA pump      Procedures/Studies: ECHOCARDIOGRAM COMPLETE  Result Date: 03/11/2023    ECHOCARDIOGRAM REPORT   Patient Name:   AHAVA DEMELLO Date of Exam: 03/11/2023 Medical Rec #:  [id number]       Height:       64.0 in Accession #:    [id number]      Weight:       277.0 lb Date of Birth:  November 14, 1945       BSA:          2.247 m Patient Age:    77 years  BP:           126/50 mmHg Patient Gender: F               HR:           70 bpm. Exam Location:  Inpatient Procedure: 2D Echo, Color Doppler and Cardiac Doppler Indications:    Syncope  History:        Patient has no prior history of Echocardiogram examinations.                 Risk Factors:Hypertension.  Sonographer:    Darlys Gales Referring Phys: [phone] Jaimin Krupka D Jean Skow IMPRESSIONS  1. Left ventricular ejection fraction, by estimation, is 60 to 65%. The left  ventricle has normal function. The left ventricle has no regional wall motion abnormalities. Left ventricular diastolic parameters were normal.  2. Right ventricular systolic function is normal. The right ventricular size is normal. Tricuspid regurgitation signal is inadequate for assessing PA pressure.  3. The mitral valve is normal in structure. Trivial mitral valve regurgitation.  4. The aortic valve is tricuspid. Aortic valve regurgitation is not visualized. No aortic stenosis is present.  5. The inferior vena cava is normal in size with greater than 50% respiratory variability, suggesting right atrial pressure of 3 mmHg. Comparison(s): No prior Echocardiogram. FINDINGS  Left Ventricle: Left ventricular ejection fraction, by estimation, is 60 to 65%. The left ventricle has normal function. The left ventricle has no regional wall motion abnormalities. The left ventricular internal cavity size was normal in size. There is  no left ventricular hypertrophy. Left ventricular diastolic parameters were normal. Right Ventricle: The right ventricular size is normal. No increase in right ventricular wall thickness. Right ventricular systolic function is normal. Tricuspid regurgitation signal is inadequate for assessing PA pressure. Left Atrium: Left atrial size was normal in size. Right Atrium: Right atrial size was normal in size. Pericardium: There is no evidence of pericardial effusion. Mitral Valve: The mitral valve is normal in structure. Trivial mitral valve regurgitation. Tricuspid Valve: The tricuspid valve is normal in structure. Tricuspid valve regurgitation is trivial. Aortic Valve: The aortic valve is tricuspid. Aortic valve regurgitation is not visualized. No aortic stenosis is present. Aortic valve mean gradient measures 7.0 mmHg. Aortic valve peak gradient measures 11.3 mmHg. Aortic valve area, by VTI measures 2.11  cm. Pulmonic Valve: The pulmonic valve was not well visualized. Pulmonic valve  regurgitation is trivial. Aorta: The aortic root is normal in size and structure. Venous: The inferior vena cava is normal in size with greater than 50% respiratory variability, suggesting right atrial pressure of 3 mmHg. IAS/Shunts: The atrial septum is grossly normal.  LEFT VENTRICLE PLAX 2D LVIDd:         4.70 cm   Diastology LVIDs:         3.50 cm   LV e' medial:    9.25 cm/s LV PW:         1.00 cm   LV E/e' medial:  13.9 LV IVS:        0.90 cm   LV e' lateral:   10.20 cm/s LVOT diam:     1.80 cm   LV E/e' lateral: 12.6 LV SV:         85 LV SV Index:   38 LVOT Area:     2.54 cm  RIGHT VENTRICLE RV S prime:     12.30 cm/s TAPSE (M-mode): 3.3 cm LEFT ATRIUM  Index        RIGHT ATRIUM           Index LA Vol (A2C):   45.4 ml 20.21 ml/m  RA Area:     13.50 cm LA Vol (A4C):   47.0 ml 20.92 ml/m  RA Volume:   33.90 ml  15.09 ml/m LA Biplane Vol: 49.5 ml 22.03 ml/m  AORTIC VALVE AV Area (Vmax):    2.18 cm AV Area (Vmean):   2.06 cm AV Area (VTI):     2.11 cm AV Vmax:           168.00 cm/s AV Vmean:          126.000 cm/s AV VTI:            0.402 m AV Peak Grad:      11.3 mmHg AV Mean Grad:      7.0 mmHg LVOT Vmax:         144.00 cm/s LVOT Vmean:        102.000 cm/s LVOT VTI:          0.334 m LVOT/AV VTI ratio: 0.83  AORTA Ao Root diam: 3.00 cm Ao Asc diam:  3.10 cm MITRAL VALVE MV Area (PHT): 4.80 cm     SHUNTS MV Decel Time: 158 msec     Systemic VTI:  0.33 m MV E velocity: 129.00 cm/s  Systemic Diam: 1.80 cm MV A velocity: 135.00 cm/s MV E/A ratio:  0.96 Laurance Flatten MD Electronically signed by Laurance Flatten MD Signature Date/Time: 03/11/2023/12:58:50 PM    Final    DG Chest Port 1 View  Result Date: 03/10/2023 CLINICAL DATA:  Syncope. EXAM: PORTABLE CHEST 1 VIEW COMPARISON:  Chest radiograph dated 01/24/2014. FINDINGS: No focal consolidation, pleural effusion, or pneumothorax. The cardiac silhouette is within normal limits. Atherosclerotic calcification of the aortic arch. No acute  osseous pathology. IMPRESSION: No active disease. Electronically Signed   By: Elgie Collard M.D.   On: 03/10/2023 21:21   DG Foot 2 Views Right  Result Date: 03/10/2023 CLINICAL DATA:  Pain post fall. EXAM: RIGHT FOOT - 2 VIEW COMPARISON:  None Available. FINDINGS: There is an oblique mildly comminuted fracture of the right proximal first phalanx with extension to the metatarsophalangeal joint. There is a suspected angulated fracture of the head of the second metatarsal bone, which is poorly visualized due to obliquity. IMPRESSION: 1. Oblique mildly comminuted fracture of the right proximal first phalanx with extension to the metatarsophalangeal joint. 2. Suspected angulated fracture of the head of the second metatarsal bone, which is poorly visualized due to obliquity. Electronically Signed   By: Ted Mcalpine M.D.   On: 03/10/2023 17:17   DG Pelvis 1-2 Views  Result Date: 03/10/2023 CLINICAL DATA:  Trauma, fall EXAM: PELVIS - 1-2 VIEW COMPARISON:  Abdomen radiographs done on 12/10/2008 FINDINGS: No recent fracture or dislocation is seen. There is previous internal fixation in the right femur. Smooth marginated calcifications are noted adjacent to the greater trochanter of proximal right femur, possibly residual from previous trauma and previous surgery. IMPRESSION: No acute findings are seen in pelvis. Electronically Signed   By: Ernie Avena M.D.   On: 03/10/2023 17:15   DG Foot 2 Views Left  Result Date: 03/10/2023 CLINICAL DATA:  Trauma, fall EXAM: LEFT FOOT - 2 VIEW COMPARISON:  08/18/2018 FINDINGS: There is radiolucent line in the medial aspect of base of proximal phalanx of left big toe. There is also possible break in the cortical margin  in the lateral aspect of base of proximal phalanx of left big toe. Deformity in the medial aspect of distal first metatarsal may suggest previous osteotomy. Osteopenia. Soft tissue swelling is noted over the dorsum. There is flattening of plantar  arch. IMPRESSION: Radiolucent lines are noted in the medial and lateral aspects of base of proximal phalanx of left big toe suggesting recent undisplaced fractures. Electronically Signed   By: Ernie Avena M.D.   On: 03/10/2023 17:14   CT Head Wo Contrast  Result Date: 03/10/2023 CLINICAL DATA:  Loss of consciousness last night with fall and trauma to the head and face. EXAM: CT HEAD WITHOUT CONTRAST CT MAXILLOFACIAL WITHOUT CONTRAST CT CERVICAL SPINE WITHOUT CONTRAST TECHNIQUE: Multidetector CT imaging of the head, cervical spine, and maxillofacial structures were performed using the standard protocol without intravenous contrast. Multiplanar CT image reconstructions of the cervical spine and maxillofacial structures were also generated. RADIATION DOSE REDUCTION: This exam was performed according to the departmental dose-optimization program which includes automated exposure control, adjustment of the mA and/or kV according to patient size and/or use of iterative reconstruction technique. COMPARISON:  None Available. FINDINGS: CT HEAD FINDINGS Brain: Previous right retro tympanic surgery. Wall down mastoidectomy. The brain itself appears normal. No evidence of atrophy, stroke, mass, hemorrhage, hydrocephalus or extra-axial collection. Vascular: There is atherosclerotic calcification of the major vessels at the base of the brain. Skull: No acute skull fracture. Other: None CT MAXILLOFACIAL FINDINGS Osseous: No acute facial fracture. Previous right mastoidectomy and occipital craniectomy. No acute traumatic finding in that region. Orbits: No orbital injury. Sinuses: No traumatic sinus fluid. Previous functional endoscopic sinus surgery. Soft tissues: Soft tissue swelling of the superficial periorbital soft tissues and forehead region. CT CERVICAL SPINE FINDINGS Alignment: No traumatic malalignment. Skull base and vertebrae: No regional fracture. Soft tissues and spinal canal: No traumatic soft tissue  injury. Disc levels: The foramen magnum is widely patent. There is ordinary mild osteoarthritis of the C1-2 articulation but no encroachment upon the neural structures. C2-3: Facet osteoarthritis on the left. Mild left foraminal narrowing. C3-4: Facet osteoarthritis on the left. Mild bony foraminal narrowing on the left. C4-5: Facet osteoarthritis on the right.  No stenosis. C5-6: Spondylosis with endplate osteophytes in some calcified protruding disc material. No apparent compressive narrowing of the canal. Mild bony foraminal narrowing left more than right. C6-7: Spondylosis with bony foraminal narrowing on both sides. C7-T1: Facet osteoarthritis with 3 mm of degenerative anterolisthesis. No canal stenosis. Bilateral foraminal narrowing. Upper chest: Negative Other: None IMPRESSION: HEAD CT: No acute or traumatic finding. Previous right mastoidectomy and occipital craniectomy. MAXILLOFACIAL CT: No acute facial fracture. Superficial soft tissue swelling of the periorbital soft tissues and forehead region. CERVICAL SPINE CT: No acute or traumatic finding. Chronic degenerative changes as outlined above. Electronically Signed   By: Paulina Fusi M.D.   On: 03/10/2023 16:55   CT Maxillofacial Wo Contrast  Result Date: 03/10/2023 CLINICAL DATA:  Loss of consciousness last night with fall and trauma to the head and face. EXAM: CT HEAD WITHOUT CONTRAST CT MAXILLOFACIAL WITHOUT CONTRAST CT CERVICAL SPINE WITHOUT CONTRAST TECHNIQUE: Multidetector CT imaging of the head, cervical spine, and maxillofacial structures were performed using the standard protocol without intravenous contrast. Multiplanar CT image reconstructions of the cervical spine and maxillofacial structures were also generated. RADIATION DOSE REDUCTION: This exam was performed according to the departmental dose-optimization program which includes automated exposure control, adjustment of the mA and/or kV according to patient size and/or use of iterative  reconstruction technique. COMPARISON:  None Available. FINDINGS: CT HEAD FINDINGS Brain: Previous right retro tympanic surgery. Wall down mastoidectomy. The brain itself appears normal. No evidence of atrophy, stroke, mass, hemorrhage, hydrocephalus or extra-axial collection. Vascular: There is atherosclerotic calcification of the major vessels at the base of the brain. Skull: No acute skull fracture. Other: None CT MAXILLOFACIAL FINDINGS Osseous: No acute facial fracture. Previous right mastoidectomy and occipital craniectomy. No acute traumatic finding in that region. Orbits: No orbital injury. Sinuses: No traumatic sinus fluid. Previous functional endoscopic sinus surgery. Soft tissues: Soft tissue swelling of the superficial periorbital soft tissues and forehead region. CT CERVICAL SPINE FINDINGS Alignment: No traumatic malalignment. Skull base and vertebrae: No regional fracture. Soft tissues and spinal canal: No traumatic soft tissue injury. Disc levels: The foramen magnum is widely patent. There is ordinary mild osteoarthritis of the C1-2 articulation but no encroachment upon the neural structures. C2-3: Facet osteoarthritis on the left. Mild left foraminal narrowing. C3-4: Facet osteoarthritis on the left. Mild bony foraminal narrowing on the left. C4-5: Facet osteoarthritis on the right.  No stenosis. C5-6: Spondylosis with endplate osteophytes in some calcified protruding disc material. No apparent compressive narrowing of the canal. Mild bony foraminal narrowing left more than right. C6-7: Spondylosis with bony foraminal narrowing on both sides. C7-T1: Facet osteoarthritis with 3 mm of degenerative anterolisthesis. No canal stenosis. Bilateral foraminal narrowing. Upper chest: Negative Other: None IMPRESSION: HEAD CT: No acute or traumatic finding. Previous right mastoidectomy and occipital craniectomy. MAXILLOFACIAL CT: No acute facial fracture. Superficial soft tissue swelling of the periorbital soft  tissues and forehead region. CERVICAL SPINE CT: No acute or traumatic finding. Chronic degenerative changes as outlined above. Electronically Signed   By: Paulina Fusi M.D.   On: 03/10/2023 16:55   CT Cervical Spine Wo Contrast  Result Date: 03/10/2023 CLINICAL DATA:  Loss of consciousness last night with fall and trauma to the head and face. EXAM: CT HEAD WITHOUT CONTRAST CT MAXILLOFACIAL WITHOUT CONTRAST CT CERVICAL SPINE WITHOUT CONTRAST TECHNIQUE: Multidetector CT imaging of the head, cervical spine, and maxillofacial structures were performed using the standard protocol without intravenous contrast. Multiplanar CT image reconstructions of the cervical spine and maxillofacial structures were also generated. RADIATION DOSE REDUCTION: This exam was performed according to the departmental dose-optimization program which includes automated exposure control, adjustment of the mA and/or kV according to patient size and/or use of iterative reconstruction technique. COMPARISON:  None Available. FINDINGS: CT HEAD FINDINGS Brain: Previous right retro tympanic surgery. Wall down mastoidectomy. The brain itself appears normal. No evidence of atrophy, stroke, mass, hemorrhage, hydrocephalus or extra-axial collection. Vascular: There is atherosclerotic calcification of the major vessels at the base of the brain. Skull: No acute skull fracture. Other: None CT MAXILLOFACIAL FINDINGS Osseous: No acute facial fracture. Previous right mastoidectomy and occipital craniectomy. No acute traumatic finding in that region. Orbits: No orbital injury. Sinuses: No traumatic sinus fluid. Previous functional endoscopic sinus surgery. Soft tissues: Soft tissue swelling of the superficial periorbital soft tissues and forehead region. CT CERVICAL SPINE FINDINGS Alignment: No traumatic malalignment. Skull base and vertebrae: No regional fracture. Soft tissues and spinal canal: No traumatic soft tissue injury. Disc levels: The foramen magnum  is widely patent. There is ordinary mild osteoarthritis of the C1-2 articulation but no encroachment upon the neural structures. C2-3: Facet osteoarthritis on the left. Mild left foraminal narrowing. C3-4: Facet osteoarthritis on the left. Mild bony foraminal narrowing on the left. C4-5: Facet osteoarthritis on the right.  No stenosis.  C5-6: Spondylosis with endplate osteophytes in some calcified protruding disc material. No apparent compressive narrowing of the canal. Mild bony foraminal narrowing left more than right. C6-7: Spondylosis with bony foraminal narrowing on both sides. C7-T1: Facet osteoarthritis with 3 mm of degenerative anterolisthesis. No canal stenosis. Bilateral foraminal narrowing. Upper chest: Negative Other: None IMPRESSION: HEAD CT: No acute or traumatic finding. Previous right mastoidectomy and occipital craniectomy. MAXILLOFACIAL CT: No acute facial fracture. Superficial soft tissue swelling of the periorbital soft tissues and forehead region. CERVICAL SPINE CT: No acute or traumatic finding. Chronic degenerative changes as outlined above. Electronically Signed   By: Paulina Fusi M.D.   On: 03/10/2023 16:55      Subjective:   Discharge Exam:  Vitals:   03/11/23 1240 03/11/23 2049 03/12/23 0430 03/12/23 0859  BP: (!) 141/67 (!) 157/66 (!) 146/61 (!) 147/55  Pulse: 66 70 71   Resp: 18 18 17    Temp: 98.3 F (36.8 C) 98.9 F (37.2 C) 98.5 F (36.9 C)   TempSrc: Oral Oral Oral   SpO2: 99% 93% 95%   Weight:      Height:        General: Pt lying comfortably in bed & appears in no obvious distress. Cardiovascular: S1 & S2 heard, RRR, S1/S2 +. No murmurs, rubs, gallops or clicks. No JVD or pedal edema. Respiratory: Clear to auscultation without wheezing, rhonchi or crackles. No increased work of breathing. Abdominal:  Non distended, non tender & soft. No organomegaly or masses appreciated. Normal bowel sounds heard. CNS: Alert and oriented. No focal deficits. Extremities:  no edema, no cyanosis    The results of significant diagnostics from this hospitalization (including imaging, microbiology, ancillary and laboratory) are listed below for reference.     Microbiology: No results found for this or any previous visit (from the past 240 hour(s)).   Labs: CBC: Recent Labs  Lab 03/10/23 1549 03/11/23 0431 03/12/23 0414  WBC 10.1 7.5 9.8  NEUTROABS  --  4.4  --   HGB 11.9* 10.7* 11.0*  HCT 36.6 33.3* 33.3*  MCV 91.5 93.0 92.0  PLT 169 149* 146*    Basic Metabolic Panel: Recent Labs  Lab 03/10/23 1549 03/11/23 0431 03/12/23 0414  NA 138 137 135  K 3.4* 3.6 3.6  CL 103 105 103  CO2 27 24 24   GLUCOSE 116* 105* 112*  BUN 18 16 11   CREATININE 0.65 0.62 0.53  CALCIUM 8.8* 8.4* 8.4*  MG 1.5* 1.6* 1.7    Liver Function Tests: Recent Labs  Lab 03/11/23 0431  AST 23  ALT 16  ALKPHOS 40  BILITOT 0.9  PROT 5.4*  ALBUMIN 3.0*    CBG: Recent Labs  Lab 03/10/23 1632  GLUCAP 114*    Hgb A1c No results for input(s): "HGBA1C" in the last 72 hours.  Lipid Profile No results for input(s): "CHOL", "HDL", "LDLCALC", "TRIG", "CHOLHDL", "LDLDIRECT" in the last 72 hours.  Thyroid function studies No results for input(s): "TSH", "T4TOTAL", "T3FREE", "THYROIDAB" in the last 72 hours.  Invalid input(s): "FREET3"  Anemia work up No results for input(s): "VITAMINB12", "FOLATE", "FERRITIN", "TIBC", "IRON", "RETICCTPCT" in the last 72 hours.  Urinalysis    Component Value Date/Time   COLORURINE STRAW (A) 03/10/2023 1649   APPEARANCEUR CLEAR 03/10/2023 1649   LABSPEC 1.006 03/10/2023 1649   PHURINE 8.0 03/10/2023 1649   GLUCOSEU NEGATIVE 03/10/2023 1649   HGBUR SMALL (A) 03/10/2023 1649   BILIRUBINUR NEGATIVE 03/10/2023 1649   KETONESUR NEGATIVE 03/10/2023 1649  PROTEINUR NEGATIVE 03/10/2023 1649   UROBILINOGEN 1.0 01/30/2014 0909   NITRITE NEGATIVE 03/10/2023 1649   LEUKOCYTESUR TRACE (A) 03/10/2023 1649      Time coordinating  discharge: *** minutes  SIGNED:  Marcellus Scott, MD,  FACP, FHM, [facility], [facility], [facility]   [facility] [place]     To contact the attending provider between 7A-7P or the covering provider during after hours 7P-7A, please log into the web site [email or web address] and access using universal [place] password for that web site. If you do not have the password, please call the hospital operator.

## 2023-03-12 NOTE — Consult Note (Signed)
WOC Nurse Consult Note: Reason for Consult: R great toe blister  Wound type:full thickness, blood filled blister  Pressure Injury POA:NA  Measurement: see nursing flowsheet  Wound bed: intact blood filled blister  Drainage (amount, consistency, odor)  Periwound: see flowsheet  Dressing procedure/placement/frequency: Clean right great toe blister with NS, apply Xeroform gauze [place] [phone]) daily and wrap with Kerlix.    Appreciate bedside nurse help with this consult.    WOC team will not follow.  Re-consult if further needs arise.   Thank you,    Priscella Mann MSN, RN-BC, [employer] [phone]

## 2023-03-12 NOTE — Consult Note (Signed)
Patient ID: [name] MRN: [id number] DOB/AGE: Jan 22, 1946 77 y.o.  Admit date: 03/10/2023  Admission Diagnoses:  Principal Problem:   Syncope and collapse Active Problems:   Dehydration   Unspecified fracture of right foot, initial encounter for closed fracture   Other fracture of left foot, initial encounter for closed fracture   Fall at home, initial encounter   Hypokalemia   Acute prerenal azotemia   Essential hypertension   Hyperlipidemia   Allergic rhinitis   Mild intermittent asthma   Urinary incontinence   Nondisplaced fracture of proximal phalanx of left great toe, initial encounter for closed fracture   Nondisplaced fracture of proximal phalanx of right great toe, initial encounter for closed fracture   HPI: Ortho consult for bilateral great toe proximal phalanx fx sustained on 03/10/23 during syncopal episode.  PMH notable for  chronic diarrhea, essential pretension, hyperlipidemia, allergic rhinitis.  Denies numbness/tingling.  Past Medical History: Past Medical History:  Diagnosis Date   Acne    Acute UTI 01/23/2014   to start ABX 01/24/2014   Allergy    Anxiety    Arthritis    feet, knee and base of thumbs   Asthma    controlled   Breast cancer (HCC) 2012   Right Breast Cancer   Breast cancer, stage 1 (HCC) 07/18/2011   Carpal tunnel syndrome    right   Dehydration 10/21/2011   Depression    Fibromyalgia    Hepatitis 1976   hepatitis B   History of hiatal hernia    Hyperlipidemia    Hypertension    Mononucleosis 1952   Multiple allergies    Nephritis 1955   Osteopenia    Personal history of chemotherapy 2012   Right Breast Cancer   PONV (postoperative nausea and vomiting)    has had to use scop patch    Surgical History: Past Surgical History:  Procedure Laterality Date   ABDOMINAL HYSTERECTOMY  09/15/1979   BREAST CAPSULECTOMY WITH IMPLANT EXCHANGE Right 03/02/2013   Procedure: BREAST CAPSULECTOMY WITH REPOSITIONING THE  IMPLANT;  Surgeon: Wayland Denis, DO;  Location: [facility];  Service: Plastics;  Laterality: Right;   BREAST SURGERY     BUNIONECTOMY  09/14/1996   left foot   CATARACT EXTRACTION Left    ETHMOIDECTOMY  09/14/1989   EYE SURGERY     facial plastic surgery  09/14/1994   JOINT REPLACEMENT Left 09/15/2003   KNEE ARTHROPLASTY Right 01/29/2014   Procedure: COMPUTER ASSISTED TOTAL KNEE ARTHROPLASTY;  Surgeon: Eldred Manges, MD;  Location: [facility];  Service: Orthopedics;  Laterality: Right;  Right Total Knee Arthroplasty   KNEE ARTHROSCOPY  1996, 2001   left   lap band surgery  09/14/2008   LIPOSUCTION Bilateral 03/02/2013   Procedure: LIPOSUCTION;  Surgeon: Wayland Denis, DO;  Location: [facility];  Service: Plastics;  Laterality: Bilateral;   MASTECTOMY Right 09/14/2010   w/chemo   MASTOPEXY  04/13/2012   Procedure: MASTOPEXY;  Surgeon: Wayland Denis, DO;  Location: [facility];  Service: Plastics;  Laterality: Left;   left breast  mastopexy reduction for symmetry   NASAL SINUS SURGERY  09/15/1999   port-a-cath placement  06/26/2011   tip in lower SVC per chest x-ray read by Dr. Dwyane Dee   [facility] REMOVAL Left 03/02/2013   Procedure: REMOVAL PORT-A-CATH;  Surgeon: [name], MD;  Location:  SURGERY CENTER;  Service: General;  Laterality: Left;   right breast lumpectomy  09/14/1984  right ear surgery  09/14/1981   tumor removed (?)   right mastectomy  06/26/2011   TONSILLECTOMY  09/14/1948   TOTAL KNEE ARTHROPLASTY  09/14/2004   left   TUBAL LIGATION  09/14/1976    Family History: Family History  Problem Relation Age of Onset   Breast cancer Paternal Aunt    Breast cancer Paternal Grandmother    Stroke Other    Hypertension Other    Heart disease Other     Social History: Social History   Socioeconomic History   Marital status: Single    Spouse name: Not on file   Number of children: Not on file   Years of  education: Not on file   Highest education level: Not on file  Occupational History   Not on file  Tobacco Use   Smoking status: Never   Smokeless tobacco: Never  Substance and Sexual Activity   Alcohol use: No    Alcohol/week: 0.0 standard drinks of alcohol   Drug use: No   Sexual activity: Not Currently  Other Topics Concern   Not on file  Social History Narrative   Not on file   Social Determinants of Health   Financial Resource Strain: Not on file  Food Insecurity: Patient Declined (03/10/2023)   Hunger Vital Sign    Worried About Running Out of Food in the Last Year: Patient declined    Ran Out of Food in the Last Year: Patient declined  Transportation Needs: Unknown (03/10/2023)   PRAPARE - Administrator, Civil Service (Medical): Patient declined    Lack of Transportation (Non-Medical): Not on file  Physical Activity: Not on file  Stress: Not on file  Social Connections: Not on file  Intimate Partner Violence: Unknown (03/10/2023)   Humiliation, Afraid, Rape, and Kick questionnaire    Fear of Current or Ex-Partner: Patient declined    Emotionally Abused: Not on file    Physically Abused: Not on file    Sexually Abused: Not on file    Allergies: Alpha-gal, Corn-containing products, Demerol [meperidine], and Morphine  Medications: I have reviewed the patient's current medications.  Vital Signs: Patient Vitals for the past 24 hrs:  BP Temp Temp src Pulse Resp SpO2  03/11/23 2049 (!) 157/66 98.9 F (37.2 C) Oral 70 18 93 %  03/11/23 1240 (!) 141/67 98.3 F (36.8 C) Oral 66 18 99 %  03/11/23 0746 -- -- -- -- -- 97 %  03/11/23 0447 (!) 126/50 98.3 F (36.8 C) Oral 65 17 94 %    Radiology: ECHOCARDIOGRAM COMPLETE  Result Date: 03/11/2023    ECHOCARDIOGRAM REPORT   Patient Name:   [name] Date of Exam: 03/11/2023 Medical Rec #:  [id number]       Height:       64.0 in Accession #:    [id number]      Weight:       277.0 lb Date of Birth:   12-23-1945       BSA:          2.247 m Patient Age:    76 years        BP:           126/50 mmHg Patient Gender: F               HR:           70 bpm. Exam Location:  Inpatient Procedure: 2D Echo, Color Doppler and Cardiac Doppler Indications:  Syncope  History:        Patient has no prior history of Echocardiogram examinations.                 Risk Factors:Hypertension.  Sonographer:    Darlys Gales Referring Phys: [phone] ANAND D HONGALGI IMPRESSIONS  1. Left ventricular ejection fraction, by estimation, is 60 to 65%. The left ventricle has normal function. The left ventricle has no regional wall motion abnormalities. Left ventricular diastolic parameters were normal.  2. Right ventricular systolic function is normal. The right ventricular size is normal. Tricuspid regurgitation signal is inadequate for assessing PA pressure.  3. The mitral valve is normal in structure. Trivial mitral valve regurgitation.  4. The aortic valve is tricuspid. Aortic valve regurgitation is not visualized. No aortic stenosis is present.  5. The inferior vena cava is normal in size with greater than 50% respiratory variability, suggesting right atrial pressure of 3 mmHg. Comparison(s): No prior Echocardiogram. FINDINGS  Left Ventricle: Left ventricular ejection fraction, by estimation, is 60 to 65%. The left ventricle has normal function. The left ventricle has no regional wall motion abnormalities. The left ventricular internal cavity size was normal in size. There is  no left ventricular hypertrophy. Left ventricular diastolic parameters were normal. Right Ventricle: The right ventricular size is normal. No increase in right ventricular wall thickness. Right ventricular systolic function is normal. Tricuspid regurgitation signal is inadequate for assessing PA pressure. Left Atrium: Left atrial size was normal in size. Right Atrium: Right atrial size was normal in size. Pericardium: There is no evidence of pericardial effusion. Mitral  Valve: The mitral valve is normal in structure. Trivial mitral valve regurgitation. Tricuspid Valve: The tricuspid valve is normal in structure. Tricuspid valve regurgitation is trivial. Aortic Valve: The aortic valve is tricuspid. Aortic valve regurgitation is not visualized. No aortic stenosis is present. Aortic valve mean gradient measures 7.0 mmHg. Aortic valve peak gradient measures 11.3 mmHg. Aortic valve area, by VTI measures 2.11  cm. Pulmonic Valve: The pulmonic valve was not well visualized. Pulmonic valve regurgitation is trivial. Aorta: The aortic root is normal in size and structure. Venous: The inferior vena cava is normal in size with greater than 50% respiratory variability, suggesting right atrial pressure of 3 mmHg. IAS/Shunts: The atrial septum is grossly normal.  LEFT VENTRICLE PLAX 2D LVIDd:         4.70 cm   Diastology LVIDs:         3.50 cm   LV e' medial:    9.25 cm/s LV PW:         1.00 cm   LV E/e' medial:  13.9 LV IVS:        0.90 cm   LV e' lateral:   10.20 cm/s LVOT diam:     1.80 cm   LV E/e' lateral: 12.6 LV SV:         85 LV SV Index:   38 LVOT Area:     2.54 cm  RIGHT VENTRICLE RV S prime:     12.30 cm/s TAPSE (M-mode): 3.3 cm LEFT ATRIUM             Index        RIGHT ATRIUM           Index LA Vol (A2C):   45.4 ml 20.21 ml/m  RA Area:     13.50 cm LA Vol (A4C):   47.0 ml 20.92 ml/m  RA Volume:   33.90 ml  15.09 ml/m  LA Biplane Vol: 49.5 ml 22.03 ml/m  AORTIC VALVE AV Area (Vmax):    2.18 cm AV Area (Vmean):   2.06 cm AV Area (VTI):     2.11 cm AV Vmax:           168.00 cm/s AV Vmean:          126.000 cm/s AV VTI:            0.402 m AV Peak Grad:      11.3 mmHg AV Mean Grad:      7.0 mmHg LVOT Vmax:         144.00 cm/s LVOT Vmean:        102.000 cm/s LVOT VTI:          0.334 m LVOT/AV VTI ratio: 0.83  AORTA Ao Root diam: 3.00 cm Ao Asc diam:  3.10 cm MITRAL VALVE MV Area (PHT): 4.80 cm     SHUNTS MV Decel Time: 158 msec     Systemic VTI:  0.33 m MV E velocity: 129.00 cm/s   Systemic Diam: 1.80 cm MV A velocity: 135.00 cm/s MV E/A ratio:  0.96 Laurance Flatten MD Electronically signed by Laurance Flatten MD Signature Date/Time: 03/11/2023/12:58:50 PM    Final    DG Chest Port 1 View  Result Date: 03/10/2023 CLINICAL DATA:  Syncope. EXAM: PORTABLE CHEST 1 VIEW COMPARISON:  Chest radiograph dated 01/24/2014. FINDINGS: No focal consolidation, pleural effusion, or pneumothorax. The cardiac silhouette is within normal limits. Atherosclerotic calcification of the aortic arch. No acute osseous pathology. IMPRESSION: No active disease. Electronically Signed   By: Elgie Collard M.D.   On: 03/10/2023 21:21   DG Foot 2 Views Right  Result Date: 03/10/2023 CLINICAL DATA:  Pain post fall. EXAM: RIGHT FOOT - 2 VIEW COMPARISON:  None Available. FINDINGS: There is an oblique mildly comminuted fracture of the right proximal first phalanx with extension to the metatarsophalangeal joint. There is a suspected angulated fracture of the head of the second metatarsal bone, which is poorly visualized due to obliquity. IMPRESSION: 1. Oblique mildly comminuted fracture of the right proximal first phalanx with extension to the metatarsophalangeal joint. 2. Suspected angulated fracture of the head of the second metatarsal bone, which is poorly visualized due to obliquity. Electronically Signed   By: Ted Mcalpine M.D.   On: 03/10/2023 17:17   DG Pelvis 1-2 Views  Result Date: 03/10/2023 CLINICAL DATA:  Trauma, fall EXAM: PELVIS - 1-2 VIEW COMPARISON:  Abdomen radiographs done on 12/10/2008 FINDINGS: No recent fracture or dislocation is seen. There is previous internal fixation in the right femur. Smooth marginated calcifications are noted adjacent to the greater trochanter of proximal right femur, possibly residual from previous trauma and previous surgery. IMPRESSION: No acute findings are seen in pelvis. Electronically Signed   By: Ernie Avena M.D.   On: 03/10/2023 17:15   DG Foot  2 Views Left  Result Date: 03/10/2023 CLINICAL DATA:  Trauma, fall EXAM: LEFT FOOT - 2 VIEW COMPARISON:  08/18/2018 FINDINGS: There is radiolucent line in the medial aspect of base of proximal phalanx of left big toe. There is also possible break in the cortical margin in the lateral aspect of base of proximal phalanx of left big toe. Deformity in the medial aspect of distal first metatarsal may suggest previous osteotomy. Osteopenia. Soft tissue swelling is noted over the dorsum. There is flattening of plantar arch. IMPRESSION: Radiolucent lines are noted in the medial and lateral aspects of base of proximal phalanx  of left big toe suggesting recent undisplaced fractures. Electronically Signed   By: Ernie Avena M.D.   On: 03/10/2023 17:14   CT Head Wo Contrast  Result Date: 03/10/2023 CLINICAL DATA:  Loss of consciousness last night with fall and trauma to the head and face. EXAM: CT HEAD WITHOUT CONTRAST CT MAXILLOFACIAL WITHOUT CONTRAST CT CERVICAL SPINE WITHOUT CONTRAST TECHNIQUE: Multidetector CT imaging of the head, cervical spine, and maxillofacial structures were performed using the standard protocol without intravenous contrast. Multiplanar CT image reconstructions of the cervical spine and maxillofacial structures were also generated. RADIATION DOSE REDUCTION: This exam was performed according to the departmental dose-optimization program which includes automated exposure control, adjustment of the mA and/or kV according to patient size and/or use of iterative reconstruction technique. COMPARISON:  None Available. FINDINGS: CT HEAD FINDINGS Brain: Previous right retro tympanic surgery. Wall down mastoidectomy. The brain itself appears normal. No evidence of atrophy, stroke, mass, hemorrhage, hydrocephalus or extra-axial collection. Vascular: There is atherosclerotic calcification of the major vessels at the base of the brain. Skull: No acute skull fracture. Other: None CT MAXILLOFACIAL  FINDINGS Osseous: No acute facial fracture. Previous right mastoidectomy and occipital craniectomy. No acute traumatic finding in that region. Orbits: No orbital injury. Sinuses: No traumatic sinus fluid. Previous functional endoscopic sinus surgery. Soft tissues: Soft tissue swelling of the superficial periorbital soft tissues and forehead region. CT CERVICAL SPINE FINDINGS Alignment: No traumatic malalignment. Skull base and vertebrae: No regional fracture. Soft tissues and spinal canal: No traumatic soft tissue injury. Disc levels: The foramen magnum is widely patent. There is ordinary mild osteoarthritis of the C1-2 articulation but no encroachment upon the neural structures. C2-3: Facet osteoarthritis on the left. Mild left foraminal narrowing. C3-4: Facet osteoarthritis on the left. Mild bony foraminal narrowing on the left. C4-5: Facet osteoarthritis on the right.  No stenosis. C5-6: Spondylosis with endplate osteophytes in some calcified protruding disc material. No apparent compressive narrowing of the canal. Mild bony foraminal narrowing left more than right. C6-7: Spondylosis with bony foraminal narrowing on both sides. C7-T1: Facet osteoarthritis with 3 mm of degenerative anterolisthesis. No canal stenosis. Bilateral foraminal narrowing. Upper chest: Negative Other: None IMPRESSION: HEAD CT: No acute or traumatic finding. Previous right mastoidectomy and occipital craniectomy. MAXILLOFACIAL CT: No acute facial fracture. Superficial soft tissue swelling of the periorbital soft tissues and forehead region. CERVICAL SPINE CT: No acute or traumatic finding. Chronic degenerative changes as outlined above. Electronically Signed   By: Paulina Fusi M.D.   On: 03/10/2023 16:55   CT Maxillofacial Wo Contrast  Result Date: 03/10/2023 CLINICAL DATA:  Loss of consciousness last night with fall and trauma to the head and face. EXAM: CT HEAD WITHOUT CONTRAST CT MAXILLOFACIAL WITHOUT CONTRAST CT CERVICAL SPINE  WITHOUT CONTRAST TECHNIQUE: Multidetector CT imaging of the head, cervical spine, and maxillofacial structures were performed using the standard protocol without intravenous contrast. Multiplanar CT image reconstructions of the cervical spine and maxillofacial structures were also generated. RADIATION DOSE REDUCTION: This exam was performed according to the departmental dose-optimization program which includes automated exposure control, adjustment of the mA and/or kV according to patient size and/or use of iterative reconstruction technique. COMPARISON:  None Available. FINDINGS: CT HEAD FINDINGS Brain: Previous right retro tympanic surgery. Wall down mastoidectomy. The brain itself appears normal. No evidence of atrophy, stroke, mass, hemorrhage, hydrocephalus or extra-axial collection. Vascular: There is atherosclerotic calcification of the major vessels at the base of the brain. Skull: No acute skull fracture. Other: None CT  MAXILLOFACIAL FINDINGS Osseous: No acute facial fracture. Previous right mastoidectomy and occipital craniectomy. No acute traumatic finding in that region. Orbits: No orbital injury. Sinuses: No traumatic sinus fluid. Previous functional endoscopic sinus surgery. Soft tissues: Soft tissue swelling of the superficial periorbital soft tissues and forehead region. CT CERVICAL SPINE FINDINGS Alignment: No traumatic malalignment. Skull base and vertebrae: No regional fracture. Soft tissues and spinal canal: No traumatic soft tissue injury. Disc levels: The foramen magnum is widely patent. There is ordinary mild osteoarthritis of the C1-2 articulation but no encroachment upon the neural structures. C2-3: Facet osteoarthritis on the left. Mild left foraminal narrowing. C3-4: Facet osteoarthritis on the left. Mild bony foraminal narrowing on the left. C4-5: Facet osteoarthritis on the right.  No stenosis. C5-6: Spondylosis with endplate osteophytes in some calcified protruding disc material. No  apparent compressive narrowing of the canal. Mild bony foraminal narrowing left more than right. C6-7: Spondylosis with bony foraminal narrowing on both sides. C7-T1: Facet osteoarthritis with 3 mm of degenerative anterolisthesis. No canal stenosis. Bilateral foraminal narrowing. Upper chest: Negative Other: None IMPRESSION: HEAD CT: No acute or traumatic finding. Previous right mastoidectomy and occipital craniectomy. MAXILLOFACIAL CT: No acute facial fracture. Superficial soft tissue swelling of the periorbital soft tissues and forehead region. CERVICAL SPINE CT: No acute or traumatic finding. Chronic degenerative changes as outlined above. Electronically Signed   By: Paulina Fusi M.D.   On: 03/10/2023 16:55   CT Cervical Spine Wo Contrast  Result Date: 03/10/2023 CLINICAL DATA:  Loss of consciousness last night with fall and trauma to the head and face. EXAM: CT HEAD WITHOUT CONTRAST CT MAXILLOFACIAL WITHOUT CONTRAST CT CERVICAL SPINE WITHOUT CONTRAST TECHNIQUE: Multidetector CT imaging of the head, cervical spine, and maxillofacial structures were performed using the standard protocol without intravenous contrast. Multiplanar CT image reconstructions of the cervical spine and maxillofacial structures were also generated. RADIATION DOSE REDUCTION: This exam was performed according to the departmental dose-optimization program which includes automated exposure control, adjustment of the mA and/or kV according to patient size and/or use of iterative reconstruction technique. COMPARISON:  None Available. FINDINGS: CT HEAD FINDINGS Brain: Previous right retro tympanic surgery. Wall down mastoidectomy. The brain itself appears normal. No evidence of atrophy, stroke, mass, hemorrhage, hydrocephalus or extra-axial collection. Vascular: There is atherosclerotic calcification of the major vessels at the base of the brain. Skull: No acute skull fracture. Other: None CT MAXILLOFACIAL FINDINGS Osseous: No acute facial  fracture. Previous right mastoidectomy and occipital craniectomy. No acute traumatic finding in that region. Orbits: No orbital injury. Sinuses: No traumatic sinus fluid. Previous functional endoscopic sinus surgery. Soft tissues: Soft tissue swelling of the superficial periorbital soft tissues and forehead region. CT CERVICAL SPINE FINDINGS Alignment: No traumatic malalignment. Skull base and vertebrae: No regional fracture. Soft tissues and spinal canal: No traumatic soft tissue injury. Disc levels: The foramen magnum is widely patent. There is ordinary mild osteoarthritis of the C1-2 articulation but no encroachment upon the neural structures. C2-3: Facet osteoarthritis on the left. Mild left foraminal narrowing. C3-4: Facet osteoarthritis on the left. Mild bony foraminal narrowing on the left. C4-5: Facet osteoarthritis on the right.  No stenosis. C5-6: Spondylosis with endplate osteophytes in some calcified protruding disc material. No apparent compressive narrowing of the canal. Mild bony foraminal narrowing left more than right. C6-7: Spondylosis with bony foraminal narrowing on both sides. C7-T1: Facet osteoarthritis with 3 mm of degenerative anterolisthesis. No canal stenosis. Bilateral foraminal narrowing. Upper chest: Negative Other: None IMPRESSION: HEAD CT:  No acute or traumatic finding. Previous right mastoidectomy and occipital craniectomy. MAXILLOFACIAL CT: No acute facial fracture. Superficial soft tissue swelling of the periorbital soft tissues and forehead region. CERVICAL SPINE CT: No acute or traumatic finding. Chronic degenerative changes as outlined above. Electronically Signed   By: Paulina Fusi M.D.   On: 03/10/2023 16:55    Labs: Recent Labs    03/10/23 1549 03/11/23 0431  WBC 10.1 7.5  RBC 4.00 3.58*  HCT 36.6 33.3*  PLT 169 149*   Recent Labs    03/10/23 1549 03/11/23 0431  NA 138 137  K 3.4* 3.6  CL 103 105  CO2 27 24  BUN 18 16  CREATININE 0.65 0.62  GLUCOSE 116*  105*  CALCIUM 8.8* 8.4*   No results for input(s): "LABPT", "INR" in the last 72 hours.  Review of Systems: ROS as detailed in HPI  Physical Exam: Body mass index is 47.55 kg/m.  Physical Exam   Gen: AAOx3, NAD Comfortable at rest  Bilateral Lower Extremity: Skin intact, hemorrhagic trauma blister over dorsum right great toe TTP over bil great toe PP ADF/APF/EHL 5/5 SILT throughout DP, PT 2+ to palp CR < 2s   Assessment and Plan: Ortho consult for bilateral great toe proximal phalanx fx sustained on 03/10/23 during syncopal episode  -history, exam and imaging reviewed at length with patient -no surgical intervention -BLE WBAT in postop shoe with walker and 1-2 person assist at all times  -PT/OT -follow up outpatient within 1 wk from discharge  Netta Cedars, MD Orthopaedic Surgeon [facility] [phone]

## 2023-03-12 NOTE — Care Management Obs Status (Signed)
MEDICARE OBSERVATION STATUS NOTIFICATION   Patient Details  Name: JAHDA JOAS MRN: [id number] Date of Birth: Feb 09, 1946   Medicare Observation Status Notification Given:  Yes    Larrie Kass, LCSW 03/12/2023, 9:59 AM

## 2023-03-12 NOTE — TOC Transition Note (Signed)
Transition of Care [facility]) - CM/SW Discharge Note   Patient Details  Name: [name] MRN: [id number] Date of Birth: 01-08-1946  Transition of Care [facility]) CM/SW Contact:  Larrie Kass, LCSW Phone Number: 03/12/2023, 10:48 AM   Clinical Narrative:    CSW spoke with pt regarding home aide and DME rec. CSW met with pt at bedside, she reports needing extra assistance at home. CSW explained pt is arranged with a home health service through [facility] and they will not come out every day. CSW informed pt about Private duty care, which is an out-of-pocket cost. Pt agreed to a rolling walker, which will be delivered to pt's room before d/c. Pt will need EMS transport home, no further TOC needs TOC signed off.       Barriers to Discharge: No Barriers Identified   Patient Goals and CMS Choice CMS Medicare.gov Compare Post Acute Care list provided to:: Patient Choice offered to / list presented to : Patient  Discharge Placement                         Discharge Plan and Services Additional resources added to the After Visit Summary for                  DME Arranged: Walker rolling DME Agency: [employer] Date DME Agency Contacted: 03/12/23 Time DME Agency Contacted: 1046 Representative spoke with at DME Agency: Vaughan Basta HH Arranged: PT, OT, Social Work [employer] Agency: [employer]        Social Determinants of Health (SDOH) Interventions SDOH Screenings   Food Insecurity: Patient Declined (03/10/2023)  Housing: Patient Declined (03/11/2023)  Transportation Needs: Unknown (03/10/2023)  Utilities: Patient Declined (03/10/2023)  Tobacco Use: Low Risk  (03/10/2023)     Readmission Risk Interventions     No data to display

## 2023-03-12 NOTE — Progress Notes (Signed)
Mobility Specialist - Progress Note   03/12/23 0855  Mobility  Activity Ambulated with assistance in room  Level of Assistance Standby assist, set-up cues, supervision of patient - no hands on  Assistive Device Front wheel walker;Other (Comment) (post-op shoes)  Distance Ambulated (ft) 20 ft  Range of Motion/Exercises Active  Activity Response Tolerated fair  Mobility Referral Yes  $Mobility charge 1 Mobility  Mobility Specialist Start Time (ACUTE ONLY) [id number]  Mobility Specialist Stop Time (ACUTE ONLY) 0854  Mobility Specialist Time Calculation (min) (ACUTE ONLY) 11 min   Pt was found in bed and agreeable to ambulate. C/o neck pain prior to session. Also stated having one step at home and worried about being able to go up the step. At EOS was left in bed with needs met. Call bell in reach and RN notified of session.  Billey Chang Mobility Specialist

## 2023-03-12 NOTE — Discharge Instructions (Addendum)

## 2023-03-12 NOTE — Discharge Summary (Signed)
Physician Discharge Summary  SHENISE SUDDUTH BJY:[id number] DOB: [birth year]  PCP: Thana Ates, MD  Admitted from: Home Discharged to: Home  Admit date: 03/10/2023 Discharge date: 03/12/2023  Recommendations for Outpatient Follow-up:    Follow-up Information     Thana Ates, MD. Schedule an appointment as soon as possible for a visit in 1 week(s).   Specialty: Internal Medicine Why: To be seen with repeat labs (CBC & BMP). Contact information: [address] [employer] [address] [place] [address] [phone]         Netta Cedars, MD. Schedule an appointment as soon as possible for a visit in 1 week(s).   Specialty: Orthopedic Surgery Contact information: [address], [address] [place] [address] [phone]                  Home Health: Home Health Orders (From admission, onward)     Start     Ordered   03/12/23 1034  Home Health  At discharge       Question Answer Comment  To provide the following care/treatments PT   To provide the following care/treatments OT      03/12/23 1036   03/10/23 0000  Home Health       Question Answer Comment  To provide the following care/treatments PT   To provide the following care/treatments OT      03/10/23 1855             Equipment/Devices:     Durable Medical Equipment  (From admission, onward)           Start     Ordered   03/11/23 1305  For home use only DME Walker rolling  Once       Question Answer Comment  Walker: With 5 Inch Wheels   Patient needs a walker to treat with the following condition Difficulty walking      03/11/23 1304   03/10/23 0000  For home use only DME 4 wheeled rolling walker with seat       Question:  Patient needs a walker to treat with the following condition  Answer:  Bilateral great toe fractures   03/10/23 1738   03/10/23 0000  For home use only DME Walker rolling       Question Answer Comment  Walker: With 5 Inch Wheels   Patient needs a walker to  treat with the following condition Bilateral great toe fractures      03/10/23 1855             Discharge Condition: Improved and stable.   Code Status: Full Code Diet recommendation:  Discharge Diet Orders (From admission, onward)     Start     Ordered   03/12/23 0000  Diet - low sodium heart healthy        03/12/23 1036             Discharge Diagnoses:  Principal Problem:   Syncope and collapse Active Problems:   Dehydration   Unspecified fracture of right foot, initial encounter for closed fracture   Other fracture of left foot, initial encounter for closed fracture   Fall at home, initial encounter   Hypokalemia   Acute prerenal azotemia   Essential hypertension   Hyperlipidemia   Allergic rhinitis   Mild intermittent asthma   Urinary incontinence   Nondisplaced fracture of proximal phalanx of left great toe, initial encounter for closed fracture   Nondisplaced fracture of proximal phalanx of  right great toe, initial encounter for closed fracture   Brief Summary: 77 year old female, lives alone/independent and drives, extensive past medical history including alpha gal syndrome, multiple allergies, anxiety and depression, asthma-remotely and not on any meds, HLD, HTN, presented to the ED following a syncopal episode, fall, and multiple injuries.  Admitted for syncope, suspected due to orthostatic changes versus vasovagal, bilateral first toe fracture, facial injuries.     Assessment & Plan:    Syncope and collapse: In the context of profuse diarrhea 2 to 3 days prior to admission, suspected intravascular volume depletion, and related related orthostatic changes or vasovagal etiology. No orthostatics done on admission, negative yesterday. Telemetry reviewed again on day of discharge, sinus rhythm.  There was an abnormal looking telemetry centimeters strip, appeared artifact, reviewed with Cardiology MD who agrees. 2D echo shows LVEF 60-65%, no aortic  stenosis. CT head, C-spine and maxillofacial without acute injuries.  Some soft tissue swelling around left eye. Therapies have evaluated and recommend home health PT and OT. Patient has been counseled that she should not drive x 6 months and she verbalizes understanding.   Bilateral great toe proximal phalanx fracture and right second metatarsal head fracture: Orthopedic consultation appreciated No acute surgical intervention, WBAT BLE in postop shoes with walker and 1-2 person assist at all times. Outpatient follow-up in 1 week from discharge. Patient has a hemorrhagic trauma blister over dorsum of right great toe.  Advised to allow it to self absorb or rupture rather than puncturing it which may become a nidus of infection.  She verbalized understanding.   Hypokalemia: Replaced.   Hypomagnesemia: Replaced.   Essential hypertension: Soft blood pressures on initial arrival in ED as low as 100/89. Lisinopril and HCTZ which were briefly held have been resumed Controlled.   Hyperlipidemia: Continue rosuvastatin.   Asthma, controlled: Patient states that she is not on any inhalers or nebulizers and insists on discontinuing all those medications.  She said that she had a brief asthma episode more than 20 years ago and is on no medications for this.   Alpha gal syndrome: Extensive history.  Appears complicated case.  Followed at [facility].  Per patient report, prior to this being diagnosed about a year or so ago, she had more frequent episodes of syncope related to this.  Since then she has made significant dietary changes, excluded mammal products from her intake with overall improvement.  On the weekend prior to admission, she tried corn which was followed by 2 days of profuse diarrhea, eventually stopped after taking a bottle of Imodium AD.  After this episode, felt weak and unsteady.  She reports that now she is developing recurrence of allergy/alpha-gal syndrome even with non mammal  products.  Continue as needed Benadryl (patient clarified that she takes 50 mg every 6 hours as needed), also takes Zyrtec liquid 10 mg daily (not Zyrtec tablet) and EpiPen for anaphylaxis.  Outpatient follow-up.   Anxiety and depression: Continue home Prozac.   Anemia and thrombocytopenia: Anemia may be dilutional.  Thrombocytopenia is mild.  Both stable.  Follow-up CBC as outpatient.   Body mass index is 47.55 kg/m./Very morbid obesity: Complicates care.  Outpatient follow-up.   Consultations: Orthopedics  Procedures: None   Discharge Instructions  Discharge Instructions     Call MD for:   Complete by: As directed    Recurrent passing out or feeling like passing out.   Call MD for:  difficulty breathing, headache or visual disturbances   Complete by: As directed  Call MD for:  extreme fatigue   Complete by: As directed    Call MD for:  persistant dizziness or light-headedness   Complete by: As directed    Call MD for:  persistant nausea and vomiting   Complete by: As directed    Call MD for:  redness, tenderness, or signs of infection (pain, swelling, redness, odor or green/yellow discharge around incision site)   Complete by: As directed    Call MD for:  severe uncontrolled pain   Complete by: As directed    Call MD for:  temperature >100.4   Complete by: As directed    Diet - low sodium heart healthy   Complete by: As directed    Driving Restrictions   Complete by: As directed    No driving for 6 months. Patient advised and verbalized understanding.   Face-to-face encounter (required for Medicare/Medicaid patients)   Complete by: As directed    I Tonette Lederer certify that this patient is under my care and that I, or a nurse practitioner or physician's assistant working with me, had a face-to-face encounter that meets the physician face-to-face encounter requirements with this patient on 03/10/2023. The encounter with the patient was in whole, or in part for the  following medical condition(s) which is the primary reason for home health care (List medical condition): Multiple toe fractures with change in ambulation status   The encounter with the patient was in whole, or in part, for the following medical condition, which is the primary reason for home health care: Bilateral multiple toe fractures   I certify that, based on my findings, the following services are medically necessary home health services:  Physical therapy Nursing     Reason for Medically Necessary Home Health Services: Therapy- Instruction on use of Assistive Device for Ambulation on all Surfaces   My clinical findings support the need for the above services: Pain interferes with ambulation/mobility   Further, I certify that my clinical findings support that this patient is homebound due to:  Unsafe ambulation due to balance issues Pain interferes with ambulation/mobility     For home use only DME 4 wheeled rolling walker with seat   Complete by: As directed    Patient needs a walker to treat with the following condition: Bilateral great toe fractures   For home use only DME Walker rolling   Complete by: As directed    Walker: With 5 Inch Wheels   Patient needs a walker to treat with the following condition: Bilateral great toe fractures   Home Health   Complete by: As directed    To provide the following care/treatments:  PT OT     Increase activity slowly   Complete by: As directed    Weight bearing as tolerated on both legs with the post op shoes.        Medication List     STOP taking these medications    cetirizine 10 MG tablet Commonly known as: ZYRTEC Replaced by: cetirizine HCl 5 MG/5ML Soln       TAKE these medications    amoxicillin 500 MG capsule Commonly known as: AMOXIL Take 500 mg by mouth See admin instructions. Take 2,000 mg by mouth one hour prior to dental procedures   cetirizine HCl 5 MG/5ML Soln Commonly known as: Zyrtec Take 10 mLs (10 mg  total) by mouth daily. Replaces: cetirizine 10 MG tablet   diphenhydrAMINE 25 mg capsule Commonly known as: BENADRYL Take 2 capsules (50  mg total) by mouth every 6 (six) hours as needed (for allergic reactions related to Alpha-Gal). What changed: how much to take   EpiPen 2-Pak 0.3 mg/0.3 mL Soaj injection Generic drug: EPINEPHrine Inject 0.3 mg into the muscle as needed for anaphylaxis.   famotidine 20 MG tablet Commonly known as: PEPCID Take 20 mg by mouth 2 (two) times daily as needed (for allergic reactions (re: Alpha-Gal)).   FISH OIL PO Take 1,400 mg by mouth daily.   FLUoxetine 20 MG capsule Commonly known as: PROZAC Take 20 mg by mouth daily.   fluticasone 50 MCG/ACT nasal spray Commonly known as: FLONASE Place 1 spray into both nostrils daily.   lisinopril-hydrochlorothiazide 10-12.5 MG tablet Commonly known as: ZESTORETIC Take 1 tablet by mouth daily.   magnesium gluconate 500 MG tablet Commonly known as: MAGONATE Take 500 mg by mouth at bedtime.   multivitamin capsule Take 1 capsule by mouth every morning.   Myrbetriq 50 MG Tb24 tablet Generic drug: mirabegron ER Take 50 mg by mouth at bedtime.   NON FORMULARY Take 300 mg by mouth See admin instructions. Quercetin 300 mg Hypoallergenic, Delayed-Release, Vegetarian Capsules- Take 300 mg by mouth once a day ("Dication" brand??)   rosuvastatin 5 MG tablet Commonly known as: CRESTOR Take 5 mg by mouth daily.   traMADol 50 MG tablet Commonly known as: ULTRAM Take 50 mg by mouth every 6 (six) hours as needed (for pain).   traZODone 50 MG tablet Commonly known as: DESYREL Take 50 mg by mouth at bedtime as needed for sleep.   tretinoin 0.1 % cream Commonly known as: RETIN-A Apply 1 application topically at bedtime.   TYLENOL 500 MG tablet Generic drug: acetaminophen Take 500-1,000 mg by mouth every 6 (six) hours as needed for mild pain or headache.   Tyrvaya 0.03 MG/ACT Soln Generic drug:  Varenicline Tartrate Place 1 spray into both nostrils 2 (two) times daily.   VITAMIN B-12 PO Take 1 tablet by mouth daily.   VITAMIN C PO Take 1 tablet by mouth daily with breakfast.   Vitamin D-3 125 MCG (5000 UT) Tabs Take 5,000 Units by mouth daily.       Allergies  Allergen Reactions   Alpha-Gal Anaphylaxis, Hives, Diarrhea and Other (See Comments)    NOTHING that includes anything mammal-related or sourced   Corn-Containing Products Diarrhea and Nausea And Vomiting   Demerol [Meperidine] Nausea And Vomiting   Morphine Nausea And Vomiting and Other (See Comments)    PCA pump      Procedures/Studies: ECHOCARDIOGRAM COMPLETE  Result Date: 03/11/2023    ECHOCARDIOGRAM REPORT   Patient Name:   PAULI KLUNDT Date of Exam: 03/11/2023 Medical Rec #:  [id number]       Height:       64.0 in Accession #:    [id number]      Weight:       277.0 lb Date of Birth:  05/10/1946       BSA:          2.247 m Patient Age:    76 years        BP:           126/50 mmHg Patient Gender: F               HR:           70 bpm. Exam Location:  Inpatient Procedure: 2D Echo, Color Doppler and Cardiac Doppler Indications:    Syncope  History:  Patient has no prior history of Echocardiogram examinations.                 Risk Factors:Hypertension.  Sonographer:    Darlys Gales Referring Phys: [phone] Inara Dike D Brealynn Contino IMPRESSIONS  1. Left ventricular ejection fraction, by estimation, is 60 to 65%. The left ventricle has normal function. The left ventricle has no regional wall motion abnormalities. Left ventricular diastolic parameters were normal.  2. Right ventricular systolic function is normal. The right ventricular size is normal. Tricuspid regurgitation signal is inadequate for assessing PA pressure.  3. The mitral valve is normal in structure. Trivial mitral valve regurgitation.  4. The aortic valve is tricuspid. Aortic valve regurgitation is not visualized. No aortic stenosis is present.  5. The inferior  vena cava is normal in size with greater than 50% respiratory variability, suggesting right atrial pressure of 3 mmHg. Comparison(s): No prior Echocardiogram. FINDINGS  Left Ventricle: Left ventricular ejection fraction, by estimation, is 60 to 65%. The left ventricle has normal function. The left ventricle has no regional wall motion abnormalities. The left ventricular internal cavity size was normal in size. There is  no left ventricular hypertrophy. Left ventricular diastolic parameters were normal. Right Ventricle: The right ventricular size is normal. No increase in right ventricular wall thickness. Right ventricular systolic function is normal. Tricuspid regurgitation signal is inadequate for assessing PA pressure. Left Atrium: Left atrial size was normal in size. Right Atrium: Right atrial size was normal in size. Pericardium: There is no evidence of pericardial effusion. Mitral Valve: The mitral valve is normal in structure. Trivial mitral valve regurgitation. Tricuspid Valve: The tricuspid valve is normal in structure. Tricuspid valve regurgitation is trivial. Aortic Valve: The aortic valve is tricuspid. Aortic valve regurgitation is not visualized. No aortic stenosis is present. Aortic valve mean gradient measures 7.0 mmHg. Aortic valve peak gradient measures 11.3 mmHg. Aortic valve area, by VTI measures 2.11  cm. Pulmonic Valve: The pulmonic valve was not well visualized. Pulmonic valve regurgitation is trivial. Aorta: The aortic root is normal in size and structure. Venous: The inferior vena cava is normal in size with greater than 50% respiratory variability, suggesting right atrial pressure of 3 mmHg. IAS/Shunts: The atrial septum is grossly normal.  LEFT VENTRICLE PLAX 2D LVIDd:         4.70 cm   Diastology LVIDs:         3.50 cm   LV e' medial:    9.25 cm/s LV PW:         1.00 cm   LV E/e' medial:  13.9 LV IVS:        0.90 cm   LV e' lateral:   10.20 cm/s LVOT diam:     1.80 cm   LV E/e' lateral:  12.6 LV SV:         85 LV SV Index:   38 LVOT Area:     2.54 cm  RIGHT VENTRICLE RV S prime:     12.30 cm/s TAPSE (M-mode): 3.3 cm LEFT ATRIUM             Index        RIGHT ATRIUM           Index LA Vol (A2C):   45.4 ml 20.21 ml/m  RA Area:     13.50 cm LA Vol (A4C):   47.0 ml 20.92 ml/m  RA Volume:   33.90 ml  15.09 ml/m LA Biplane Vol: 49.5 ml 22.03 ml/m  AORTIC  VALVE AV Area (Vmax):    2.18 cm AV Area (Vmean):   2.06 cm AV Area (VTI):     2.11 cm AV Vmax:           168.00 cm/s AV Vmean:          126.000 cm/s AV VTI:            0.402 m AV Peak Grad:      11.3 mmHg AV Mean Grad:      7.0 mmHg LVOT Vmax:         144.00 cm/s LVOT Vmean:        102.000 cm/s LVOT VTI:          0.334 m LVOT/AV VTI ratio: 0.83  AORTA Ao Root diam: 3.00 cm Ao Asc diam:  3.10 cm MITRAL VALVE MV Area (PHT): 4.80 cm     SHUNTS MV Decel Time: 158 msec     Systemic VTI:  0.33 m MV E velocity: 129.00 cm/s  Systemic Diam: 1.80 cm MV A velocity: 135.00 cm/s MV E/A ratio:  0.96 Laurance Flatten MD Electronically signed by Laurance Flatten MD Signature Date/Time: 03/11/2023/12:58:50 PM    Final    DG Chest Port 1 View  Result Date: 03/10/2023 CLINICAL DATA:  Syncope. EXAM: PORTABLE CHEST 1 VIEW COMPARISON:  Chest radiograph dated 01/24/2014. FINDINGS: No focal consolidation, pleural effusion, or pneumothorax. The cardiac silhouette is within normal limits. Atherosclerotic calcification of the aortic arch. No acute osseous pathology. IMPRESSION: No active disease. Electronically Signed   By: Elgie Collard M.D.   On: 03/10/2023 21:21   DG Foot 2 Views Right  Result Date: 03/10/2023 CLINICAL DATA:  Pain post fall. EXAM: RIGHT FOOT - 2 VIEW COMPARISON:  None Available. FINDINGS: There is an oblique mildly comminuted fracture of the right proximal first phalanx with extension to the metatarsophalangeal joint. There is a suspected angulated fracture of the head of the second metatarsal bone, which is poorly visualized due to  obliquity. IMPRESSION: 1. Oblique mildly comminuted fracture of the right proximal first phalanx with extension to the metatarsophalangeal joint. 2. Suspected angulated fracture of the head of the second metatarsal bone, which is poorly visualized due to obliquity. Electronically Signed   By: Ted Mcalpine M.D.   On: 03/10/2023 17:17   DG Pelvis 1-2 Views  Result Date: 03/10/2023 CLINICAL DATA:  Trauma, fall EXAM: PELVIS - 1-2 VIEW COMPARISON:  Abdomen radiographs done on 12/10/2008 FINDINGS: No recent fracture or dislocation is seen. There is previous internal fixation in the right femur. Smooth marginated calcifications are noted adjacent to the greater trochanter of proximal right femur, possibly residual from previous trauma and previous surgery. IMPRESSION: No acute findings are seen in pelvis. Electronically Signed   By: Ernie Avena M.D.   On: 03/10/2023 17:15   DG Foot 2 Views Left  Result Date: 03/10/2023 CLINICAL DATA:  Trauma, fall EXAM: LEFT FOOT - 2 VIEW COMPARISON:  08/18/2018 FINDINGS: There is radiolucent line in the medial aspect of base of proximal phalanx of left big toe. There is also possible break in the cortical margin in the lateral aspect of base of proximal phalanx of left big toe. Deformity in the medial aspect of distal first metatarsal may suggest previous osteotomy. Osteopenia. Soft tissue swelling is noted over the dorsum. There is flattening of plantar arch. IMPRESSION: Radiolucent lines are noted in the medial and lateral aspects of base of proximal phalanx of left big toe suggesting recent undisplaced fractures. Electronically Signed  By: Ernie Avena M.D.   On: 03/10/2023 17:14   CT Head Wo Contrast  Result Date: 03/10/2023 CLINICAL DATA:  Loss of consciousness last night with fall and trauma to the head and face. EXAM: CT HEAD WITHOUT CONTRAST CT MAXILLOFACIAL WITHOUT CONTRAST CT CERVICAL SPINE WITHOUT CONTRAST TECHNIQUE: Multidetector CT imaging  of the head, cervical spine, and maxillofacial structures were performed using the standard protocol without intravenous contrast. Multiplanar CT image reconstructions of the cervical spine and maxillofacial structures were also generated. RADIATION DOSE REDUCTION: This exam was performed according to the departmental dose-optimization program which includes automated exposure control, adjustment of the mA and/or kV according to patient size and/or use of iterative reconstruction technique. COMPARISON:  None Available. FINDINGS: CT HEAD FINDINGS Brain: Previous right retro tympanic surgery. Wall down mastoidectomy. The brain itself appears normal. No evidence of atrophy, stroke, mass, hemorrhage, hydrocephalus or extra-axial collection. Vascular: There is atherosclerotic calcification of the major vessels at the base of the brain. Skull: No acute skull fracture. Other: None CT MAXILLOFACIAL FINDINGS Osseous: No acute facial fracture. Previous right mastoidectomy and occipital craniectomy. No acute traumatic finding in that region. Orbits: No orbital injury. Sinuses: No traumatic sinus fluid. Previous functional endoscopic sinus surgery. Soft tissues: Soft tissue swelling of the superficial periorbital soft tissues and forehead region. CT CERVICAL SPINE FINDINGS Alignment: No traumatic malalignment. Skull base and vertebrae: No regional fracture. Soft tissues and spinal canal: No traumatic soft tissue injury. Disc levels: The foramen magnum is widely patent. There is ordinary mild osteoarthritis of the C1-2 articulation but no encroachment upon the neural structures. C2-3: Facet osteoarthritis on the left. Mild left foraminal narrowing. C3-4: Facet osteoarthritis on the left. Mild bony foraminal narrowing on the left. C4-5: Facet osteoarthritis on the right.  No stenosis. C5-6: Spondylosis with endplate osteophytes in some calcified protruding disc material. No apparent compressive narrowing of the canal. Mild bony  foraminal narrowing left more than right. C6-7: Spondylosis with bony foraminal narrowing on both sides. C7-T1: Facet osteoarthritis with 3 mm of degenerative anterolisthesis. No canal stenosis. Bilateral foraminal narrowing. Upper chest: Negative Other: None IMPRESSION: HEAD CT: No acute or traumatic finding. Previous right mastoidectomy and occipital craniectomy. MAXILLOFACIAL CT: No acute facial fracture. Superficial soft tissue swelling of the periorbital soft tissues and forehead region. CERVICAL SPINE CT: No acute or traumatic finding. Chronic degenerative changes as outlined above. Electronically Signed   By: Paulina Fusi M.D.   On: 03/10/2023 16:55   CT Maxillofacial Wo Contrast  Result Date: 03/10/2023 CLINICAL DATA:  Loss of consciousness last night with fall and trauma to the head and face. EXAM: CT HEAD WITHOUT CONTRAST CT MAXILLOFACIAL WITHOUT CONTRAST CT CERVICAL SPINE WITHOUT CONTRAST TECHNIQUE: Multidetector CT imaging of the head, cervical spine, and maxillofacial structures were performed using the standard protocol without intravenous contrast. Multiplanar CT image reconstructions of the cervical spine and maxillofacial structures were also generated. RADIATION DOSE REDUCTION: This exam was performed according to the departmental dose-optimization program which includes automated exposure control, adjustment of the mA and/or kV according to patient size and/or use of iterative reconstruction technique. COMPARISON:  None Available. FINDINGS: CT HEAD FINDINGS Brain: Previous right retro tympanic surgery. Wall down mastoidectomy. The brain itself appears normal. No evidence of atrophy, stroke, mass, hemorrhage, hydrocephalus or extra-axial collection. Vascular: There is atherosclerotic calcification of the major vessels at the base of the brain. Skull: No acute skull fracture. Other: None CT MAXILLOFACIAL FINDINGS Osseous: No acute facial fracture. Previous right mastoidectomy and occipital  craniectomy. No acute traumatic finding in that region. Orbits: No orbital injury. Sinuses: No traumatic sinus fluid. Previous functional endoscopic sinus surgery. Soft tissues: Soft tissue swelling of the superficial periorbital soft tissues and forehead region. CT CERVICAL SPINE FINDINGS Alignment: No traumatic malalignment. Skull base and vertebrae: No regional fracture. Soft tissues and spinal canal: No traumatic soft tissue injury. Disc levels: The foramen magnum is widely patent. There is ordinary mild osteoarthritis of the C1-2 articulation but no encroachment upon the neural structures. C2-3: Facet osteoarthritis on the left. Mild left foraminal narrowing. C3-4: Facet osteoarthritis on the left. Mild bony foraminal narrowing on the left. C4-5: Facet osteoarthritis on the right.  No stenosis. C5-6: Spondylosis with endplate osteophytes in some calcified protruding disc material. No apparent compressive narrowing of the canal. Mild bony foraminal narrowing left more than right. C6-7: Spondylosis with bony foraminal narrowing on both sides. C7-T1: Facet osteoarthritis with 3 mm of degenerative anterolisthesis. No canal stenosis. Bilateral foraminal narrowing. Upper chest: Negative Other: None IMPRESSION: HEAD CT: No acute or traumatic finding. Previous right mastoidectomy and occipital craniectomy. MAXILLOFACIAL CT: No acute facial fracture. Superficial soft tissue swelling of the periorbital soft tissues and forehead region. CERVICAL SPINE CT: No acute or traumatic finding. Chronic degenerative changes as outlined above. Electronically Signed   By: Paulina Fusi M.D.   On: 03/10/2023 16:55   CT Cervical Spine Wo Contrast  Result Date: 03/10/2023 CLINICAL DATA:  Loss of consciousness last night with fall and trauma to the head and face. EXAM: CT HEAD WITHOUT CONTRAST CT MAXILLOFACIAL WITHOUT CONTRAST CT CERVICAL SPINE WITHOUT CONTRAST TECHNIQUE: Multidetector CT imaging of the head, cervical spine, and  maxillofacial structures were performed using the standard protocol without intravenous contrast. Multiplanar CT image reconstructions of the cervical spine and maxillofacial structures were also generated. RADIATION DOSE REDUCTION: This exam was performed according to the departmental dose-optimization program which includes automated exposure control, adjustment of the mA and/or kV according to patient size and/or use of iterative reconstruction technique. COMPARISON:  None Available. FINDINGS: CT HEAD FINDINGS Brain: Previous right retro tympanic surgery. Wall down mastoidectomy. The brain itself appears normal. No evidence of atrophy, stroke, mass, hemorrhage, hydrocephalus or extra-axial collection. Vascular: There is atherosclerotic calcification of the major vessels at the base of the brain. Skull: No acute skull fracture. Other: None CT MAXILLOFACIAL FINDINGS Osseous: No acute facial fracture. Previous right mastoidectomy and occipital craniectomy. No acute traumatic finding in that region. Orbits: No orbital injury. Sinuses: No traumatic sinus fluid. Previous functional endoscopic sinus surgery. Soft tissues: Soft tissue swelling of the superficial periorbital soft tissues and forehead region. CT CERVICAL SPINE FINDINGS Alignment: No traumatic malalignment. Skull base and vertebrae: No regional fracture. Soft tissues and spinal canal: No traumatic soft tissue injury. Disc levels: The foramen magnum is widely patent. There is ordinary mild osteoarthritis of the C1-2 articulation but no encroachment upon the neural structures. C2-3: Facet osteoarthritis on the left. Mild left foraminal narrowing. C3-4: Facet osteoarthritis on the left. Mild bony foraminal narrowing on the left. C4-5: Facet osteoarthritis on the right.  No stenosis. C5-6: Spondylosis with endplate osteophytes in some calcified protruding disc material. No apparent compressive narrowing of the canal. Mild bony foraminal narrowing left more than  right. C6-7: Spondylosis with bony foraminal narrowing on both sides. C7-T1: Facet osteoarthritis with 3 mm of degenerative anterolisthesis. No canal stenosis. Bilateral foraminal narrowing. Upper chest: Negative Other: None IMPRESSION: HEAD CT: No acute or traumatic finding. Previous right mastoidectomy and occipital craniectomy. MAXILLOFACIAL  CT: No acute facial fracture. Superficial soft tissue swelling of the periorbital soft tissues and forehead region. CERVICAL SPINE CT: No acute or traumatic finding. Chronic degenerative changes as outlined above. Electronically Signed   By: Paulina Fusi M.D.   On: 03/10/2023 16:55      Subjective: Denies complaints.  Clarified her home meds including Benadryl, Zyrtec etc. and made real-time changes to her discharge meds at her bedside.  States that she had a mild allergic reaction yesterday and only got Benadryl 25 mg rather than the 50 mg that she usually takes at home.  No pain reported.  Agreeable to DC home.  Discharge Exam:  Vitals:   03/11/23 2049 03/12/23 0430 03/12/23 0859 03/12/23 1414  BP: (!) 157/66 (!) 146/61 (!) 147/55 137/62  Pulse: 70 71  72  Resp: 18 17  15   Temp: 98.9 F (37.2 C) 98.5 F (36.9 C)  98.5 F (36.9 C)  TempSrc: Oral Oral  Oral  SpO2: 93% 95%  94%  Weight:      Height:        General exam: Elderly female, moderately built and obese lying comfortably propped up in bed without distress. HEENT: Mild left periorbital swelling but able to open eyes, improving.  No subconjunctival hemorrhage.  PERTLA. Respiratory system: Clear to auscultation. Respiratory effort normal. Cardiovascular system: S1 & S2 heard, RRR. No JVD, murmurs, rubs, gallops or clicks. No pedal edema.  Telemetry personally reviewed: Sinus rhythm without arrhythmias. Gastrointestinal system: Abdomen is nondistended, soft and nontender. No organomegaly or masses felt. Normal bowel sounds heard. Central nervous system: Alert and oriented. No focal  neurological deficits. Extremities: Symmetric 5 x 5 power.  Traumatic hemorrhagic blister on the dorsum of right first toe, not tense or tender.  Both first toe's and distal forefoot with some expected bruising.  Has bilateral postop shoes. Skin: No rashes, lesions or ulcers Psychiatry: Judgement and insight appear normal. Mood & affect appropriate.    The results of significant diagnostics from this hospitalization (including imaging, microbiology, ancillary and laboratory) are listed below for reference.     Microbiology: No results found for this or any previous visit (from the past 240 hour(s)).   Labs: CBC: Recent Labs  Lab 03/10/23 1549 03/11/23 0431 03/12/23 0414  WBC 10.1 7.5 9.8  NEUTROABS  --  4.4  --   HGB 11.9* 10.7* 11.0*  HCT 36.6 33.3* 33.3*  MCV 91.5 93.0 92.0  PLT 169 149* 146*    Basic Metabolic Panel: Recent Labs  Lab 03/10/23 1549 03/11/23 0431 03/12/23 0414  NA 138 137 135  K 3.4* 3.6 3.6  CL 103 105 103  CO2 27 24 24   GLUCOSE 116* 105* 112*  BUN 18 16 11   CREATININE 0.65 0.62 0.53  CALCIUM 8.8* 8.4* 8.4*  MG 1.5* 1.6* 1.7    Liver Function Tests: Recent Labs  Lab 03/11/23 0431  AST 23  ALT 16  ALKPHOS 40  BILITOT 0.9  PROT 5.4*  ALBUMIN 3.0*    CBG: Recent Labs  Lab 03/10/23 1632  GLUCAP 114*     Urinalysis    Component Value Date/Time   COLORURINE STRAW (A) 03/10/2023 1649   APPEARANCEUR CLEAR 03/10/2023 1649   LABSPEC 1.006 03/10/2023 1649   PHURINE 8.0 03/10/2023 1649   GLUCOSEU NEGATIVE 03/10/2023 1649   HGBUR SMALL (A) 03/10/2023 1649   BILIRUBINUR NEGATIVE 03/10/2023 1649   KETONESUR NEGATIVE 03/10/2023 1649   PROTEINUR NEGATIVE 03/10/2023 1649   UROBILINOGEN 1.0 01/30/2014 0909  NITRITE NEGATIVE 03/10/2023 1649   LEUKOCYTESUR TRACE (A) 03/10/2023 1649      Time coordinating discharge: 35 minutes  SIGNED:  Marcellus Scott, MD,  FACP, FHM, [facility], [facility], [facility]   [facility]  Advisor [place]     To contact the attending provider between 7A-7P or the covering provider during after hours 7P-7A, please log into the web site [email or web address] and access using universal Sebastian password for that web site. If you do not have the password, please call the hospital operator.

## 2023-03-17 ENCOUNTER — Ambulatory Visit: Payer: Medicare PPO

## 2023-03-17 DIAGNOSIS — J4520 Mild intermittent asthma, uncomplicated: Secondary | ICD-10-CM | POA: Diagnosis not present

## 2023-03-17 DIAGNOSIS — J309 Allergic rhinitis, unspecified: Secondary | ICD-10-CM | POA: Diagnosis not present

## 2023-03-17 DIAGNOSIS — S92414D Nondisplaced fracture of proximal phalanx of right great toe, subsequent encounter for fracture with routine healing: Secondary | ICD-10-CM | POA: Diagnosis not present

## 2023-03-17 DIAGNOSIS — E785 Hyperlipidemia, unspecified: Secondary | ICD-10-CM | POA: Diagnosis not present

## 2023-03-17 DIAGNOSIS — E860 Dehydration: Secondary | ICD-10-CM | POA: Diagnosis not present

## 2023-03-17 DIAGNOSIS — S92415D Nondisplaced fracture of proximal phalanx of left great toe, subsequent encounter for fracture with routine healing: Secondary | ICD-10-CM | POA: Diagnosis not present

## 2023-03-17 DIAGNOSIS — S92321D Displaced fracture of second metatarsal bone, right foot, subsequent encounter for fracture with routine healing: Secondary | ICD-10-CM | POA: Diagnosis not present

## 2023-03-17 DIAGNOSIS — F419 Anxiety disorder, unspecified: Secondary | ICD-10-CM | POA: Diagnosis not present

## 2023-03-17 DIAGNOSIS — I10 Essential (primary) hypertension: Secondary | ICD-10-CM | POA: Diagnosis not present

## 2023-03-22 DIAGNOSIS — S92401D Displaced unspecified fracture of right great toe, subsequent encounter for fracture with routine healing: Secondary | ICD-10-CM | POA: Diagnosis not present

## 2023-03-22 DIAGNOSIS — S92402D Displaced unspecified fracture of left great toe, subsequent encounter for fracture with routine healing: Secondary | ICD-10-CM | POA: Diagnosis not present

## 2023-03-22 DIAGNOSIS — R197 Diarrhea, unspecified: Secondary | ICD-10-CM | POA: Diagnosis not present

## 2023-03-22 DIAGNOSIS — Z87898 Personal history of other specified conditions: Secondary | ICD-10-CM | POA: Diagnosis not present

## 2023-03-24 DIAGNOSIS — F419 Anxiety disorder, unspecified: Secondary | ICD-10-CM | POA: Diagnosis not present

## 2023-03-24 DIAGNOSIS — S92414D Nondisplaced fracture of proximal phalanx of right great toe, subsequent encounter for fracture with routine healing: Secondary | ICD-10-CM | POA: Diagnosis not present

## 2023-03-24 DIAGNOSIS — I10 Essential (primary) hypertension: Secondary | ICD-10-CM | POA: Diagnosis not present

## 2023-03-24 DIAGNOSIS — E860 Dehydration: Secondary | ICD-10-CM | POA: Diagnosis not present

## 2023-03-24 DIAGNOSIS — J4520 Mild intermittent asthma, uncomplicated: Secondary | ICD-10-CM | POA: Diagnosis not present

## 2023-03-24 DIAGNOSIS — S92415D Nondisplaced fracture of proximal phalanx of left great toe, subsequent encounter for fracture with routine healing: Secondary | ICD-10-CM | POA: Diagnosis not present

## 2023-03-24 DIAGNOSIS — J309 Allergic rhinitis, unspecified: Secondary | ICD-10-CM | POA: Diagnosis not present

## 2023-03-24 DIAGNOSIS — S92321D Displaced fracture of second metatarsal bone, right foot, subsequent encounter for fracture with routine healing: Secondary | ICD-10-CM | POA: Diagnosis not present

## 2023-03-24 DIAGNOSIS — E785 Hyperlipidemia, unspecified: Secondary | ICD-10-CM | POA: Diagnosis not present

## 2023-03-26 DIAGNOSIS — J309 Allergic rhinitis, unspecified: Secondary | ICD-10-CM | POA: Diagnosis not present

## 2023-03-26 DIAGNOSIS — S92321D Displaced fracture of second metatarsal bone, right foot, subsequent encounter for fracture with routine healing: Secondary | ICD-10-CM | POA: Diagnosis not present

## 2023-03-26 DIAGNOSIS — F419 Anxiety disorder, unspecified: Secondary | ICD-10-CM | POA: Diagnosis not present

## 2023-03-26 DIAGNOSIS — S92414D Nondisplaced fracture of proximal phalanx of right great toe, subsequent encounter for fracture with routine healing: Secondary | ICD-10-CM | POA: Diagnosis not present

## 2023-03-26 DIAGNOSIS — E860 Dehydration: Secondary | ICD-10-CM | POA: Diagnosis not present

## 2023-03-26 DIAGNOSIS — E785 Hyperlipidemia, unspecified: Secondary | ICD-10-CM | POA: Diagnosis not present

## 2023-03-26 DIAGNOSIS — I10 Essential (primary) hypertension: Secondary | ICD-10-CM | POA: Diagnosis not present

## 2023-03-26 DIAGNOSIS — S92415D Nondisplaced fracture of proximal phalanx of left great toe, subsequent encounter for fracture with routine healing: Secondary | ICD-10-CM | POA: Diagnosis not present

## 2023-03-26 DIAGNOSIS — J4520 Mild intermittent asthma, uncomplicated: Secondary | ICD-10-CM | POA: Diagnosis not present

## 2023-03-30 DIAGNOSIS — S92415D Nondisplaced fracture of proximal phalanx of left great toe, subsequent encounter for fracture with routine healing: Secondary | ICD-10-CM | POA: Diagnosis not present

## 2023-03-30 DIAGNOSIS — I10 Essential (primary) hypertension: Secondary | ICD-10-CM | POA: Diagnosis not present

## 2023-03-30 DIAGNOSIS — F419 Anxiety disorder, unspecified: Secondary | ICD-10-CM | POA: Diagnosis not present

## 2023-03-30 DIAGNOSIS — J4520 Mild intermittent asthma, uncomplicated: Secondary | ICD-10-CM | POA: Diagnosis not present

## 2023-03-30 DIAGNOSIS — J309 Allergic rhinitis, unspecified: Secondary | ICD-10-CM | POA: Diagnosis not present

## 2023-03-30 DIAGNOSIS — S92414D Nondisplaced fracture of proximal phalanx of right great toe, subsequent encounter for fracture with routine healing: Secondary | ICD-10-CM | POA: Diagnosis not present

## 2023-03-30 DIAGNOSIS — E785 Hyperlipidemia, unspecified: Secondary | ICD-10-CM | POA: Diagnosis not present

## 2023-03-30 DIAGNOSIS — S92321D Displaced fracture of second metatarsal bone, right foot, subsequent encounter for fracture with routine healing: Secondary | ICD-10-CM | POA: Diagnosis not present

## 2023-03-30 DIAGNOSIS — E860 Dehydration: Secondary | ICD-10-CM | POA: Diagnosis not present

## 2023-04-02 DIAGNOSIS — F419 Anxiety disorder, unspecified: Secondary | ICD-10-CM | POA: Diagnosis not present

## 2023-04-02 DIAGNOSIS — J4520 Mild intermittent asthma, uncomplicated: Secondary | ICD-10-CM | POA: Diagnosis not present

## 2023-04-02 DIAGNOSIS — S92321D Displaced fracture of second metatarsal bone, right foot, subsequent encounter for fracture with routine healing: Secondary | ICD-10-CM | POA: Diagnosis not present

## 2023-04-02 DIAGNOSIS — I10 Essential (primary) hypertension: Secondary | ICD-10-CM | POA: Diagnosis not present

## 2023-04-02 DIAGNOSIS — E785 Hyperlipidemia, unspecified: Secondary | ICD-10-CM | POA: Diagnosis not present

## 2023-04-02 DIAGNOSIS — J309 Allergic rhinitis, unspecified: Secondary | ICD-10-CM | POA: Diagnosis not present

## 2023-04-02 DIAGNOSIS — S92415D Nondisplaced fracture of proximal phalanx of left great toe, subsequent encounter for fracture with routine healing: Secondary | ICD-10-CM | POA: Diagnosis not present

## 2023-04-02 DIAGNOSIS — E860 Dehydration: Secondary | ICD-10-CM | POA: Diagnosis not present

## 2023-04-02 DIAGNOSIS — S92414D Nondisplaced fracture of proximal phalanx of right great toe, subsequent encounter for fracture with routine healing: Secondary | ICD-10-CM | POA: Diagnosis not present

## 2023-04-13 DIAGNOSIS — E785 Hyperlipidemia, unspecified: Secondary | ICD-10-CM | POA: Diagnosis not present

## 2023-04-13 DIAGNOSIS — E860 Dehydration: Secondary | ICD-10-CM | POA: Diagnosis not present

## 2023-04-13 DIAGNOSIS — S92415D Nondisplaced fracture of proximal phalanx of left great toe, subsequent encounter for fracture with routine healing: Secondary | ICD-10-CM | POA: Diagnosis not present

## 2023-04-13 DIAGNOSIS — S92321D Displaced fracture of second metatarsal bone, right foot, subsequent encounter for fracture with routine healing: Secondary | ICD-10-CM | POA: Diagnosis not present

## 2023-04-13 DIAGNOSIS — J4520 Mild intermittent asthma, uncomplicated: Secondary | ICD-10-CM | POA: Diagnosis not present

## 2023-04-13 DIAGNOSIS — J309 Allergic rhinitis, unspecified: Secondary | ICD-10-CM | POA: Diagnosis not present

## 2023-04-13 DIAGNOSIS — I10 Essential (primary) hypertension: Secondary | ICD-10-CM | POA: Diagnosis not present

## 2023-04-13 DIAGNOSIS — S92414D Nondisplaced fracture of proximal phalanx of right great toe, subsequent encounter for fracture with routine healing: Secondary | ICD-10-CM | POA: Diagnosis not present

## 2023-04-13 DIAGNOSIS — F419 Anxiety disorder, unspecified: Secondary | ICD-10-CM | POA: Diagnosis not present

## 2023-04-16 DIAGNOSIS — S92321D Displaced fracture of second metatarsal bone, right foot, subsequent encounter for fracture with routine healing: Secondary | ICD-10-CM | POA: Diagnosis not present

## 2023-04-16 DIAGNOSIS — S92415D Nondisplaced fracture of proximal phalanx of left great toe, subsequent encounter for fracture with routine healing: Secondary | ICD-10-CM | POA: Diagnosis not present

## 2023-04-16 DIAGNOSIS — E860 Dehydration: Secondary | ICD-10-CM | POA: Diagnosis not present

## 2023-04-16 DIAGNOSIS — J309 Allergic rhinitis, unspecified: Secondary | ICD-10-CM | POA: Diagnosis not present

## 2023-04-16 DIAGNOSIS — J4520 Mild intermittent asthma, uncomplicated: Secondary | ICD-10-CM | POA: Diagnosis not present

## 2023-04-16 DIAGNOSIS — F419 Anxiety disorder, unspecified: Secondary | ICD-10-CM | POA: Diagnosis not present

## 2023-04-16 DIAGNOSIS — E785 Hyperlipidemia, unspecified: Secondary | ICD-10-CM | POA: Diagnosis not present

## 2023-04-16 DIAGNOSIS — S92414D Nondisplaced fracture of proximal phalanx of right great toe, subsequent encounter for fracture with routine healing: Secondary | ICD-10-CM | POA: Diagnosis not present

## 2023-04-16 DIAGNOSIS — I10 Essential (primary) hypertension: Secondary | ICD-10-CM | POA: Diagnosis not present

## 2023-04-19 ENCOUNTER — Ambulatory Visit
Admission: RE | Admit: 2023-04-19 | Discharge: 2023-04-19 | Disposition: A | Discharge status: Home / Self Care | Payer: Medicare PPO | Source: Ambulatory Visit | Attending: Internal Medicine | Admitting: Internal Medicine

## 2023-04-19 DIAGNOSIS — Z1231 Encounter for screening mammogram for malignant neoplasm of breast: Secondary | ICD-10-CM | POA: Diagnosis not present

## 2023-04-21 DIAGNOSIS — S92321D Displaced fracture of second metatarsal bone, right foot, subsequent encounter for fracture with routine healing: Secondary | ICD-10-CM | POA: Diagnosis not present

## 2023-04-21 DIAGNOSIS — E785 Hyperlipidemia, unspecified: Secondary | ICD-10-CM | POA: Diagnosis not present

## 2023-04-21 DIAGNOSIS — E860 Dehydration: Secondary | ICD-10-CM | POA: Diagnosis not present

## 2023-04-21 DIAGNOSIS — I10 Essential (primary) hypertension: Secondary | ICD-10-CM | POA: Diagnosis not present

## 2023-04-21 DIAGNOSIS — S92415D Nondisplaced fracture of proximal phalanx of left great toe, subsequent encounter for fracture with routine healing: Secondary | ICD-10-CM | POA: Diagnosis not present

## 2023-04-21 DIAGNOSIS — J4520 Mild intermittent asthma, uncomplicated: Secondary | ICD-10-CM | POA: Diagnosis not present

## 2023-04-21 DIAGNOSIS — S92414D Nondisplaced fracture of proximal phalanx of right great toe, subsequent encounter for fracture with routine healing: Secondary | ICD-10-CM | POA: Diagnosis not present

## 2023-04-21 DIAGNOSIS — J309 Allergic rhinitis, unspecified: Secondary | ICD-10-CM | POA: Diagnosis not present

## 2023-04-21 DIAGNOSIS — F419 Anxiety disorder, unspecified: Secondary | ICD-10-CM | POA: Diagnosis not present

## 2023-04-23 DIAGNOSIS — S92414D Nondisplaced fracture of proximal phalanx of right great toe, subsequent encounter for fracture with routine healing: Secondary | ICD-10-CM | POA: Diagnosis not present

## 2023-04-23 DIAGNOSIS — M79672 Pain in left foot: Secondary | ICD-10-CM | POA: Diagnosis not present

## 2023-04-23 DIAGNOSIS — S92415D Nondisplaced fracture of proximal phalanx of left great toe, subsequent encounter for fracture with routine healing: Secondary | ICD-10-CM | POA: Diagnosis not present

## 2023-04-23 DIAGNOSIS — M79671 Pain in right foot: Secondary | ICD-10-CM | POA: Diagnosis not present

## 2023-05-06 DIAGNOSIS — S92415D Nondisplaced fracture of proximal phalanx of left great toe, subsequent encounter for fracture with routine healing: Secondary | ICD-10-CM | POA: Diagnosis not present

## 2023-05-06 DIAGNOSIS — F419 Anxiety disorder, unspecified: Secondary | ICD-10-CM | POA: Diagnosis not present

## 2023-05-06 DIAGNOSIS — E785 Hyperlipidemia, unspecified: Secondary | ICD-10-CM | POA: Diagnosis not present

## 2023-05-06 DIAGNOSIS — J4520 Mild intermittent asthma, uncomplicated: Secondary | ICD-10-CM | POA: Diagnosis not present

## 2023-05-06 DIAGNOSIS — S92321D Displaced fracture of second metatarsal bone, right foot, subsequent encounter for fracture with routine healing: Secondary | ICD-10-CM | POA: Diagnosis not present

## 2023-05-06 DIAGNOSIS — S92414D Nondisplaced fracture of proximal phalanx of right great toe, subsequent encounter for fracture with routine healing: Secondary | ICD-10-CM | POA: Diagnosis not present

## 2023-05-06 DIAGNOSIS — I10 Essential (primary) hypertension: Secondary | ICD-10-CM | POA: Diagnosis not present

## 2023-05-06 DIAGNOSIS — J309 Allergic rhinitis, unspecified: Secondary | ICD-10-CM | POA: Diagnosis not present

## 2023-05-06 DIAGNOSIS — E860 Dehydration: Secondary | ICD-10-CM | POA: Diagnosis not present

## 2023-05-10 DIAGNOSIS — I10 Essential (primary) hypertension: Secondary | ICD-10-CM | POA: Diagnosis not present

## 2023-05-10 DIAGNOSIS — J309 Allergic rhinitis, unspecified: Secondary | ICD-10-CM | POA: Diagnosis not present

## 2023-05-10 DIAGNOSIS — J4520 Mild intermittent asthma, uncomplicated: Secondary | ICD-10-CM | POA: Diagnosis not present

## 2023-05-10 DIAGNOSIS — S92321D Displaced fracture of second metatarsal bone, right foot, subsequent encounter for fracture with routine healing: Secondary | ICD-10-CM | POA: Diagnosis not present

## 2023-05-10 DIAGNOSIS — E860 Dehydration: Secondary | ICD-10-CM | POA: Diagnosis not present

## 2023-05-10 DIAGNOSIS — E785 Hyperlipidemia, unspecified: Secondary | ICD-10-CM | POA: Diagnosis not present

## 2023-05-10 DIAGNOSIS — F419 Anxiety disorder, unspecified: Secondary | ICD-10-CM | POA: Diagnosis not present

## 2023-05-10 DIAGNOSIS — S92415D Nondisplaced fracture of proximal phalanx of left great toe, subsequent encounter for fracture with routine healing: Secondary | ICD-10-CM | POA: Diagnosis not present

## 2023-05-10 DIAGNOSIS — S92414D Nondisplaced fracture of proximal phalanx of right great toe, subsequent encounter for fracture with routine healing: Secondary | ICD-10-CM | POA: Diagnosis not present

## 2023-07-02 DIAGNOSIS — R350 Frequency of micturition: Secondary | ICD-10-CM | POA: Diagnosis not present

## 2023-07-02 DIAGNOSIS — R351 Nocturia: Secondary | ICD-10-CM | POA: Diagnosis not present

## 2023-07-02 DIAGNOSIS — N3946 Mixed incontinence: Secondary | ICD-10-CM | POA: Diagnosis not present

## 2023-07-05 DIAGNOSIS — Z79899 Other long term (current) drug therapy: Secondary | ICD-10-CM | POA: Diagnosis not present

## 2023-07-05 DIAGNOSIS — I10 Essential (primary) hypertension: Secondary | ICD-10-CM | POA: Diagnosis not present

## 2023-07-05 DIAGNOSIS — F330 Major depressive disorder, recurrent, mild: Secondary | ICD-10-CM | POA: Diagnosis not present

## 2023-07-05 DIAGNOSIS — R32 Unspecified urinary incontinence: Secondary | ICD-10-CM | POA: Diagnosis not present

## 2023-07-05 DIAGNOSIS — R600 Localized edema: Secondary | ICD-10-CM | POA: Diagnosis not present

## 2023-07-16 DIAGNOSIS — H2513 Age-related nuclear cataract, bilateral: Secondary | ICD-10-CM | POA: Diagnosis not present

## 2023-07-27 ENCOUNTER — Other Ambulatory Visit (INDEPENDENT_AMBULATORY_CARE_PROVIDER_SITE_OTHER): Payer: Self-pay

## 2023-07-27 ENCOUNTER — Ambulatory Visit: Payer: Medicare PPO | Admitting: Orthopedic Surgery

## 2023-07-27 DIAGNOSIS — M79671 Pain in right foot: Secondary | ICD-10-CM

## 2023-07-27 DIAGNOSIS — M79672 Pain in left foot: Secondary | ICD-10-CM

## 2023-08-03 ENCOUNTER — Encounter: Payer: Self-pay | Admitting: Orthopedic Surgery

## 2023-08-03 NOTE — Progress Notes (Signed)
Office Visit Note   Patient: [name]           Date of Birth: 1946-05-22           MRN: [id number] Visit Date: 07/27/2023              Requested by: Thana Ates, MD [address] [place],  [place] [address] PCP: Thana Ates, MD  Chief Complaint  Patient presents with   Left Foot - Pain   Right Foot - Pain      HPI: Patient is a 77 year old woman who is seen for initial evaluation for bilateral foot pain.  Patient states she has had a history of fractures in the toes from a fall in July.  Patient states she has persistent pain with ambulation.  Assessment & Plan: Visit Diagnoses:  1. Bilateral foot pain     Plan: Recommended a stiff soled sneaker with sole orthotics and a carbon plate.  Follow-Up Instructions: Return if symptoms worsen or fail to improve.   Ortho Exam  Patient is alert, oriented, no adenopathy, well-dressed, normal affect, normal respiratory effort. Examination patient has palpable pulses bilaterally.  She has pain and swelling across the forefoot bilaterally.  There is venous stasis insufficiency with swelling in both lower extremities.  She has bunion deformity of the great toe bilaterally and the great toe is beneath the clawing of the second toes bilaterally.  Left worse than right.  She has hallux rigidus bilaterally with dorsiflexion of only 20 degrees.  Patient has Achilles contracture with dorsiflexion to neutral bilaterally.  She has progressive pes planus deformity bilaterally.  No open ulcers.  Metatarsal heads are prominent secondary to bunion deformity.  Imaging: No results found. No images are attached to the encounter.  Labs: Lab Results  Component Value Date   REPTSTATUS 01/26/2014 FINAL 01/24/2014   CULT  01/24/2014    ESCHERICHIA COLI Performed at [facility]   [facility] ESCHERICHIA COLI 01/24/2014     Lab Results  Component Value Date   ALBUMIN 3.0 (L) 03/11/2023   ALBUMIN 3.7 01/24/2014    ALBUMIN 3.2 (L) 06/09/2013    Lab Results  Component Value Date   MG 1.7 03/12/2023   MG 1.6 (L) 03/11/2023   MG 1.5 (L) 03/10/2023   No results found for: "VD25OH"  No results found for: "PREALBUMIN"    Latest Ref Rng & Units 03/12/2023    4:14 AM 03/11/2023    4:31 AM 03/10/2023    3:49 PM  CBC EXTENDED  WBC 4.0 - 10.5 K/uL 9.8  7.5  10.1   RBC 3.87 - 5.11 MIL/uL 3.62  3.58  4.00   Hemoglobin 12.0 - 15.0 g/dL 13.2  44.0  10.2   HCT 36.0 - 46.0 % 33.3  33.3  36.6   Platelets 150 - 400 K/uL 146  149  169   NEUT# 1.7 - 7.7 K/uL  4.4    Lymph# 0.7 - 4.0 K/uL  1.6       There is no height or weight on file to calculate BMI.  Orders:  Orders Placed This Encounter  Procedures   XR Foot 2 Views Right   XR Foot 2 Views Left   No orders of the defined types were placed in this encounter.    Procedures: No procedures performed  Clinical Data: No additional findings.  ROS:  All other systems negative, except as noted in the HPI. Review of Systems  Objective:  Vital Signs: There were no vitals taken for this visit.  Specialty Comments:  No specialty comments available.  PMFS History: Patient Active Problem List   Diagnosis Date Noted   Syncope and collapse 03/10/2023   Unspecified fracture of right foot, initial encounter for closed fracture 03/10/2023   Other fracture of left foot, initial encounter for closed fracture 03/10/2023   Fall at home, initial encounter 03/10/2023   Hypokalemia 03/10/2023   Acute prerenal azotemia 03/10/2023   Essential hypertension 03/10/2023   Hyperlipidemia 03/10/2023   Allergic rhinitis 03/10/2023   Mild intermittent asthma 03/10/2023   Urinary incontinence 03/10/2023   Nondisplaced fracture of proximal phalanx of left great toe, initial encounter for closed fracture 03/10/2023   Nondisplaced fracture of proximal phalanx of right great toe, initial encounter for closed fracture 03/10/2023   Osteoarthritis of right knee  01/29/2014    Class: Diagnosis of   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   Acquired absence of breast and nipple 04/13/2012   Symptomatic mammary hypertrophy 04/13/2012   Dehydration 10/21/2011   Breast cancer, stage 1 (HCC) 07/18/2011   Past Medical History:  Diagnosis Date   Acne    Acute UTI 01/23/2014   to start ABX 01/24/2014   Allergy    Anxiety    Arthritis    feet, knee and base of thumbs   Asthma    controlled   Breast cancer (HCC) 2012   Right Breast Cancer   Breast cancer, stage 1 (HCC) 07/18/2011   Carpal tunnel syndrome    right   Dehydration 10/21/2011   Depression    Fibromyalgia    Hepatitis 1976   hepatitis B   History of hiatal hernia    Hyperlipidemia    Hypertension    Mononucleosis 1952   Multiple allergies    Nephritis 1955   Osteopenia    Personal history of chemotherapy 2012   Right Breast Cancer   PONV (postoperative nausea and vomiting)    has had to use scop patch    Family History  Problem Relation Age of Onset   Breast cancer Paternal Aunt    Breast cancer Paternal Grandmother    Stroke Other    Hypertension Other    Heart disease Other     Past Surgical History:  Procedure Laterality Date   ABDOMINAL HYSTERECTOMY  09/15/1979   BREAST CAPSULECTOMY WITH IMPLANT EXCHANGE Right 03/02/2013   Procedure: BREAST CAPSULECTOMY WITH REPOSITIONING THE IMPLANT;  Surgeon: Wayland Denis, DO;  Location: [facility];  Service: Plastics;  Laterality: Right;   BREAST SURGERY     BUNIONECTOMY  09/14/1996   left foot   CATARACT EXTRACTION Left    ETHMOIDECTOMY  09/14/1989   EYE SURGERY     facial plastic surgery  09/14/1994   JOINT REPLACEMENT Left 09/15/2003   KNEE ARTHROPLASTY Right 01/29/2014   Procedure: COMPUTER ASSISTED TOTAL KNEE ARTHROPLASTY;  Surgeon: Eldred Manges, MD;  Location: [facility];  Service: Orthopedics;  Laterality: Right;  Right Total Knee Arthroplasty   KNEE ARTHROSCOPY  1996, 2001   left   lap band surgery  09/14/2008    LIPOSUCTION Bilateral 03/02/2013   Procedure: LIPOSUCTION;  Surgeon: Wayland Denis, DO;  Location: [facility];  Service: Plastics;  Laterality: Bilateral;   MASTECTOMY Right 09/14/2010   w/chemo   MASTOPEXY  04/13/2012   Procedure: MASTOPEXY;  Surgeon: Wayland Denis, DO;  Location: [facility];  Service: Plastics;  Laterality: Left;   left breast  mastopexy reduction  for symmetry   NASAL SINUS SURGERY  09/15/1999   port-a-cath placement  06/26/2011   tip in lower SVC per chest x-ray read by Dr. Dwyane Dee   [facility] REMOVAL Left 03/02/2013   Procedure: REMOVAL PORT-A-CATH;  Surgeon: [name], MD;  Location: [facility];  Service: General;  Laterality: Left;   right breast lumpectomy  09/14/1984   right ear surgery  09/14/1981   tumor removed (?)   right mastectomy  06/26/2011   TONSILLECTOMY  09/14/1948   TOTAL KNEE ARTHROPLASTY  09/14/2004   left   TUBAL LIGATION  09/14/1976   Social History   Occupational History   Not on file  Tobacco Use   Smoking status: Never   Smokeless tobacco: Never  Substance and Sexual Activity   Alcohol use: No    Alcohol/week: 0.0 standard drinks of alcohol   Drug use: No   Sexual activity: Not Currently

## 2023-08-05 ENCOUNTER — Other Ambulatory Visit: Payer: Self-pay | Admitting: Internal Medicine

## 2023-08-05 DIAGNOSIS — M8589 Other specified disorders of bone density and structure, multiple sites: Secondary | ICD-10-CM

## 2023-08-06 ENCOUNTER — Other Ambulatory Visit: Payer: Medicare PPO

## 2023-08-20 DIAGNOSIS — Z6841 Body Mass Index (BMI) 40.0 and over, adult: Secondary | ICD-10-CM | POA: Diagnosis not present

## 2023-08-20 DIAGNOSIS — R030 Elevated blood-pressure reading, without diagnosis of hypertension: Secondary | ICD-10-CM | POA: Diagnosis not present

## 2023-08-20 DIAGNOSIS — F330 Major depressive disorder, recurrent, mild: Secondary | ICD-10-CM | POA: Diagnosis not present

## 2023-08-30 DIAGNOSIS — F331 Major depressive disorder, recurrent, moderate: Secondary | ICD-10-CM | POA: Diagnosis not present

## 2023-09-20 DIAGNOSIS — F4010 Social phobia, unspecified: Secondary | ICD-10-CM | POA: Diagnosis not present

## 2023-09-20 DIAGNOSIS — F331 Major depressive disorder, recurrent, moderate: Secondary | ICD-10-CM | POA: Diagnosis not present

## 2023-09-27 DIAGNOSIS — F331 Major depressive disorder, recurrent, moderate: Secondary | ICD-10-CM | POA: Diagnosis not present

## 2023-09-27 DIAGNOSIS — F4011 Social phobia, generalized: Secondary | ICD-10-CM | POA: Diagnosis not present

## 2023-10-04 DIAGNOSIS — F4011 Social phobia, generalized: Secondary | ICD-10-CM | POA: Diagnosis not present

## 2023-10-04 DIAGNOSIS — F331 Major depressive disorder, recurrent, moderate: Secondary | ICD-10-CM | POA: Diagnosis not present

## 2023-10-11 DIAGNOSIS — F4011 Social phobia, generalized: Secondary | ICD-10-CM | POA: Diagnosis not present

## 2023-10-11 DIAGNOSIS — F331 Major depressive disorder, recurrent, moderate: Secondary | ICD-10-CM | POA: Diagnosis not present

## 2023-10-14 ENCOUNTER — Ambulatory Visit: Payer: Medicare PPO | Admitting: Obstetrics and Gynecology

## 2023-10-18 DIAGNOSIS — F331 Major depressive disorder, recurrent, moderate: Secondary | ICD-10-CM | POA: Diagnosis not present

## 2023-10-18 DIAGNOSIS — F4011 Social phobia, generalized: Secondary | ICD-10-CM | POA: Diagnosis not present

## 2023-10-20 ENCOUNTER — Ambulatory Visit (INDEPENDENT_AMBULATORY_CARE_PROVIDER_SITE_OTHER): Payer: Medicare PPO | Admitting: Family Medicine

## 2023-10-20 ENCOUNTER — Encounter (INDEPENDENT_AMBULATORY_CARE_PROVIDER_SITE_OTHER): Payer: Self-pay | Admitting: Family Medicine

## 2023-10-20 VITALS — BP 130/76 | HR 62 | Temp 97.6°F | Ht 63.0 in | Wt 240.0 lb

## 2023-10-20 DIAGNOSIS — E782 Mixed hyperlipidemia: Secondary | ICD-10-CM

## 2023-10-20 DIAGNOSIS — E785 Hyperlipidemia, unspecified: Secondary | ICD-10-CM

## 2023-10-20 DIAGNOSIS — Z6841 Body Mass Index (BMI) 40.0 and over, adult: Secondary | ICD-10-CM

## 2023-10-20 DIAGNOSIS — I10 Essential (primary) hypertension: Secondary | ICD-10-CM | POA: Diagnosis not present

## 2023-10-20 DIAGNOSIS — M19071 Primary osteoarthritis, right ankle and foot: Secondary | ICD-10-CM | POA: Diagnosis not present

## 2023-10-20 DIAGNOSIS — M19072 Primary osteoarthritis, left ankle and foot: Secondary | ICD-10-CM | POA: Diagnosis not present

## 2023-10-20 DIAGNOSIS — M79672 Pain in left foot: Secondary | ICD-10-CM | POA: Diagnosis not present

## 2023-10-20 DIAGNOSIS — M79671 Pain in right foot: Secondary | ICD-10-CM | POA: Diagnosis not present

## 2023-10-20 DIAGNOSIS — R739 Hyperglycemia, unspecified: Secondary | ICD-10-CM | POA: Diagnosis not present

## 2023-10-20 DIAGNOSIS — N3281 Overactive bladder: Secondary | ICD-10-CM

## 2023-10-20 DIAGNOSIS — E669 Obesity, unspecified: Secondary | ICD-10-CM | POA: Diagnosis not present

## 2023-10-20 DIAGNOSIS — Z0289 Encounter for other administrative examinations: Secondary | ICD-10-CM

## 2023-10-20 NOTE — Progress Notes (Signed)
 .smr  Office: [phone]  /  Fax: [phone]  WEIGHT SUMMARY AND BIOMETRICS  Anthropometric Measurements Height: 5' 3 (1.6 m) Weight: 240 lb (108.9 kg) BMI (Calculated): 42.52 Weight at Last Visit: N/A Weight Lost Since Last Visit: N/A Weight Gained Since Last Visit: N/A Starting Weight: N/A Peak Weight: N/A   Body Composition  Body Fat %: 55.9 % Fat Mass (lbs): 134.4 lbs Muscle Mass (lbs): 100.6 lbs Visceral Fat Rating : 21   Other Clinical Data Fasting: NO Labs: NO Today's Visit #: INFO SESSION Comments: INFORMATION SESSION    Chief Complaint: OBESITY    History of Present Illness   [name] is a 78 year old female with obesity who presents to discuss her obesity and related comorbidities. She was referred by Dr. Trula Brim for evaluation of her obesity and related comorbidities.  She has a history of obesity with a current weight of 240 pounds and a BMI of 42.6, with her highest recorded weight being 250 pounds. Her obesity is associated with hypertension, hyperlipidemia, and hyperglycemia. Her visceral fat rating is 21, and body fat percentage is 55.9%.  She has significant foot issues, including multiple surgeries and arthritis, which have been exacerbated by her weight. A fall in the summer resulted in broken toes, leading to increased sedentary behavior due to pain, further impacting her ability to exercise.  In 2010, she had a lap band placed, which was initially effective for weight loss. However, dietary habits changed during cancer treatment, leading to weight gain. She has attempted various diets, including keto and low-carb, with past success in losing weight and improving her blood work.  She has a history of alpha-gal syndrome, currently in remission, which previously impacted her diet as she had to avoid mammalian products and was cautious about ingredients in medications. She can now consume red meat without issues, which she finds satisfying  and helpful in managing her diet.  She has a history of overactive bladder, which she believes is worsened by her weight.  She was diagnosed with cancer in 2012, which affected her eating habits during treatment.          PHYSICAL EXAM:  Blood pressure 130/76, pulse 62, temperature 97.6 F (36.4 C), height 5' 3 (1.6 m), weight 240 lb (108.9 kg), SpO2 98%. Body mass index is 42.51 kg/m.  DIAGNOSTIC DATA REVIEWED:  BMET    Component Value Date/Time   NA 135 03/12/2023 0414   NA 139 06/09/2013 1227   K 3.6 03/12/2023 0414   K 3.7 06/09/2013 1227   CL 103 03/12/2023 0414   CL 99 09/09/2012 1245   CO2 24 03/12/2023 0414   CO2 27 06/09/2013 1227   GLUCOSE 112 (H) 03/12/2023 0414   GLUCOSE 98 06/09/2013 1227   GLUCOSE 96 09/09/2012 1245   BUN 11 03/12/2023 0414   BUN 17.4 06/09/2013 1227   CREATININE 0.53 03/12/2023 0414   CREATININE 0.8 06/09/2013 1227   CALCIUM  8.4 (L) 03/12/2023 0414   CALCIUM  9.8 06/09/2013 1227   GFRNONAA >60 03/12/2023 0414   GFRAA 80 (L) 01/30/2014 0521   No results found for: HGBA1C No results found for: INSULIN  No results found for: TSH CBC    Component Value Date/Time   WBC 9.8 03/12/2023 0414   RBC 3.62 (L) 03/12/2023 0414   HGB 11.0 (L) 03/12/2023 0414   HGB 13.0 06/09/2013 1227   HCT 33.3 (L) 03/12/2023 0414   HCT 39.2 06/09/2013 1227   PLT 146 (L) 03/12/2023 0414  PLT 222 06/09/2013 1227   MCV 92.0 03/12/2023 0414   MCV 88.7 06/09/2013 1227   MCH 30.4 03/12/2023 0414   MCHC 33.0 03/12/2023 0414   RDW 13.0 03/12/2023 0414   RDW 13.1 06/09/2013 1227   Iron Studies No results found for: IRON, TIBC, FERRITIN, IRONPCTSAT Lipid Panel  No results found for: CHOL, TRIG, HDL, CHOLHDL, VLDL, LDLCALC, LDLDIRECT Hepatic Function Panel     Component Value Date/Time   PROT 5.4 (L) 03/11/2023 0431   PROT 6.2 (L) 06/09/2013 1227   ALBUMIN 3.0 (L) 03/11/2023 0431   ALBUMIN 3.2 (L) 06/09/2013 1227   AST 23  03/11/2023 0431   AST 16 06/09/2013 1227   ALT 16 03/11/2023 0431   ALT 13 06/09/2013 1227   ALKPHOS 40 03/11/2023 0431   ALKPHOS 47 06/09/2013 1227   BILITOT 0.9 03/11/2023 0431   BILITOT 0.39 06/09/2013 1227   No results found for: TSH Nutritional No results found for: VD25OH   Assessment and Plan    Obesity Presents with obesity, BMI 42.6, weight 240 lbs, highest weight 250 lbs. Comorbidities include hypertension, hyperlipidemia, and hyperglycemia. Visceral fat rating 21, body fat percentage 55.9%. Discussed genetic and physiological mechanisms contributing to obesity, including reduced metabolism, increased hunger, and fat storage. Emphasized personalized approach to nutrition and weight management. Discussed potential use of weight loss medications to reduce carb cravings if needed. Explained that medications facilitate management but do not directly cause weight loss. Initial focus on nutrition, with exercise recommendations adjusted for foot pain and arthritis. - Provide paperwork on health, food preferences, and lifestyle. -Arrive 1 hour before next appointment for testing - Schedule follow-up visit for EKG, metabolism test, and blood work. - Instruct to fast for 8 hours before the next visit, avoiding caffeine, nicotine, and exercise. - Develop personalized eating plan based on test results. - Schedule bi-weekly visits for the first three months, then extend to every three to four weeks if progress is made. - Discuss potential use of weight loss medications if necessary. -$99 program fee discussed  Hypertension Hypertension related to obesity. Emphasized weight management to control blood pressure and reduce cardiovascular risk. - Monitor blood pressure regularly. - Include blood pressure management in overall weight management plan.  Hyperlipidemia Hyperlipidemia. Discussed impact of different diets on cholesterol levels and importance of a personalized diet. - Include  lipid profile in blood work during next visit. - Monitor cholesterol levels regularly. - Adjust diet to manage cholesterol levels as part of personalized eating plan.  Hyperglycemia Hyperglycemia. Noted poor carbohydrate tolerance based on past dietary experiences. - Include glucose levels in blood work during next visit. - Monitor blood glucose levels regularly. - Adjust diet to manage blood glucose levels as part of personalized eating plan.  Foot Pain and Arthritis Multiple foot surgeries, arthritis, and bone spurs in toes, exacerbated by weight. Recent fall resulted in broken toes and increased pain. - Consider non-weight-bearing exercises such as chair yoga. - Monitor foot pain and adjust exercise recommendations accordingly. -Will work on weight loss to improve pain  Overactive Bladder Reports overactive bladder, believes exacerbated by weight. - Monitor symptoms and include bladder management in overall weight management plan.  General Health Maintenance Discussed importance of overall health maintenance, including prevention of heart attacks, strokes, and congestive heart failure. Goal is to improve quality of life and longevity. - Perform EKG to monitor heart health. - Conduct comprehensive blood work, including lipid profile and glucose levels. - Provide nutritional education as part of weight management  program.  Follow-up - Schedule follow-up visit for testing and initial workup. - Ensure arrival one hour early for next visit for testing. - Schedule bi-weekly visits for first three months, then extend to every three to four weeks if progress is made.         I have personally spent 45 minutes total time today in preparation, patient care, and documentation for this visit, including the following: review of clinical lab tests; review of medical tests/procedures/services.    She was informed of the importance of frequent follow up visits to maximize her success with  intensive lifestyle modifications for her multiple health conditions.    Louann Penton, MD

## 2023-10-21 DIAGNOSIS — R350 Frequency of micturition: Secondary | ICD-10-CM | POA: Diagnosis not present

## 2023-10-21 DIAGNOSIS — N3946 Mixed incontinence: Secondary | ICD-10-CM | POA: Diagnosis not present

## 2023-10-25 DIAGNOSIS — F331 Major depressive disorder, recurrent, moderate: Secondary | ICD-10-CM | POA: Diagnosis not present

## 2023-10-25 DIAGNOSIS — F4011 Social phobia, generalized: Secondary | ICD-10-CM | POA: Diagnosis not present

## 2023-11-08 DIAGNOSIS — F4011 Social phobia, generalized: Secondary | ICD-10-CM | POA: Diagnosis not present

## 2023-11-08 DIAGNOSIS — F331 Major depressive disorder, recurrent, moderate: Secondary | ICD-10-CM | POA: Diagnosis not present

## 2023-11-18 ENCOUNTER — Ambulatory Visit (INDEPENDENT_AMBULATORY_CARE_PROVIDER_SITE_OTHER): Payer: Medicare PPO | Admitting: Family Medicine

## 2023-11-18 ENCOUNTER — Encounter (INDEPENDENT_AMBULATORY_CARE_PROVIDER_SITE_OTHER): Payer: Self-pay | Admitting: Family Medicine

## 2023-11-18 VITALS — BP 112/68 | HR 63 | Temp 97.9°F | Ht 63.0 in | Wt 241.0 lb

## 2023-11-18 DIAGNOSIS — M1990 Unspecified osteoarthritis, unspecified site: Secondary | ICD-10-CM | POA: Diagnosis not present

## 2023-11-18 DIAGNOSIS — R0602 Shortness of breath: Secondary | ICD-10-CM

## 2023-11-18 DIAGNOSIS — Z1331 Encounter for screening for depression: Secondary | ICD-10-CM

## 2023-11-18 DIAGNOSIS — R739 Hyperglycemia, unspecified: Secondary | ICD-10-CM

## 2023-11-18 DIAGNOSIS — I10 Essential (primary) hypertension: Secondary | ICD-10-CM

## 2023-11-18 DIAGNOSIS — R5383 Other fatigue: Secondary | ICD-10-CM

## 2023-11-18 DIAGNOSIS — M7989 Other specified soft tissue disorders: Secondary | ICD-10-CM | POA: Insufficient documentation

## 2023-11-18 DIAGNOSIS — E559 Vitamin D deficiency, unspecified: Secondary | ICD-10-CM

## 2023-11-18 DIAGNOSIS — Z9884 Bariatric surgery status: Secondary | ICD-10-CM

## 2023-11-18 DIAGNOSIS — E785 Hyperlipidemia, unspecified: Secondary | ICD-10-CM

## 2023-11-18 DIAGNOSIS — Z91014 Allergy to mammalian meats: Secondary | ICD-10-CM

## 2023-11-18 DIAGNOSIS — E669 Obesity, unspecified: Secondary | ICD-10-CM

## 2023-11-18 DIAGNOSIS — E782 Mixed hyperlipidemia: Secondary | ICD-10-CM

## 2023-11-18 DIAGNOSIS — Z91018 Allergy to other foods: Secondary | ICD-10-CM | POA: Insufficient documentation

## 2023-11-18 DIAGNOSIS — Z6841 Body Mass Index (BMI) 40.0 and over, adult: Secondary | ICD-10-CM

## 2023-11-18 DIAGNOSIS — R0789 Other chest pain: Secondary | ICD-10-CM | POA: Insufficient documentation

## 2023-11-18 DIAGNOSIS — E66813 Obesity, class 3: Secondary | ICD-10-CM | POA: Insufficient documentation

## 2023-11-18 NOTE — Progress Notes (Signed)
 Office: [phone]  /  Fax: [phone]  WEIGHT SUMMARY AND BIOMETRICS  Anthropometric Measurements Height: 5\' 3"  (1.6 m) Weight: 241 lb (109.3 kg) BMI (Calculated): 42.7 Weight at Last Visit: NA Weight Lost Since Last Visit: NA Weight Gained Since Last Visit: NA Starting Weight: 241lb Total Weight Loss (lbs): 0 lb (0 kg) Peak Weight: 260lb   Body Composition  Body Fat %: 55.2 % Fat Mass (lbs): 133.2 lbs Muscle Mass (lbs): 102.6 lbs Visceral Fat Rating : 20   Other Clinical Data RMR: 1483 Fasting: Yes Labs: Yes Today's Visit #: 1 Starting Date: 11/18/23 Comments: New Patient    Chief Complaint: OBESITY    History of Present Illness   [name] is a 78 year old female who presents for evaluation and management of obesity.  She has a history of obesity, with a lap band placed in 2010 when her weight was 260 pounds. Her lowest weight post-surgery was 180 pounds, but she began regaining weight approximately six years later, attributing this to depression. Her gastric band was removed last year due to esophageal issues, including difficulty swallowing and gurgling sounds. Her current weight is 241 pounds, and she aims to reduce it to 150 pounds within a year.  She has comorbidities associated with her obesity, including hypertension, hyperlipidemia, hyperglycemia, and vitamin D deficiency. She experiences fatigue and shortness of breath on exertion, which she believes are related to her weight. She has a history of alpha-gal syndrome, currently in remission.  Her dietary habits include a preference for a low-carb diet, which she finds effective. She eats out once a week and consumes fast food or takeout once a week. She does most of her grocery shopping and enjoys cooking, though she faces obstacles such as decreased motivation. She has cravings for bread, cookies, cheese, and meat, particularly in the evenings. She does not snack frequently between meals but tends  to snack at night. She sometimes skips meals when attempting intermittent fasting. Emotional eating and portion control are challenges for her, and she sometimes eats until uncomfortably full. She tends to eat quickly and often while distracted, such as watching TV.  Her sleep pattern includes about six hours of sleep per night, waking up tired. No morning headaches, waking up gasping for air, or being told she stops breathing during sleep. Her mood and food-modified PHQ-9 score is high at 20, indicating significant depressive symptoms. Her sleepiness score is within normal limits at 7.  She is retired and lives alone. She does not engage in regular exercise or use a fitness tracker. She has a history of a fall in July, which, along with inactivity, has contributed to her weight gain. She wants to regain her life and improve her health and comfort through weight loss.   DIAGNOSTIC PHQ-9: 20 (11/18/2023) Epworth Sleepiness Scale: 7 (11/18/2023) Resting metabolic rate: 1477 (11/18/2023) REE: 1483 [11/18/2023] EKG: Normal sinus rhythm, heart rate 63 bpm (11/18/2023)          PHYSICAL EXAM:  Blood pressure 112/68, pulse 63, temperature 97.9 F (36.6 C), height 5\' 3"  (1.6 m), weight 241 lb (109.3 kg), SpO2 94%. Body mass index is 42.69 kg/m.  DIAGNOSTIC DATA REVIEWED:  BMET    Component Value Date/Time   NA 135 03/12/2023 0414   NA 139 06/09/2013 1227   K 3.6 03/12/2023 0414   K 3.7 06/09/2013 1227   CL 103 03/12/2023 0414   CL 99 09/09/2012 1245   CO2 24 03/12/2023 0414   CO2  27 06/09/2013 1227   GLUCOSE 112 (H) 03/12/2023 0414   GLUCOSE 98 06/09/2013 1227   GLUCOSE 96 09/09/2012 1245   BUN 11 03/12/2023 0414   BUN 17.4 06/09/2013 1227   CREATININE 0.53 03/12/2023 0414   CREATININE 0.8 06/09/2013 1227   CALCIUM 8.4 (L) 03/12/2023 0414   CALCIUM 9.8 06/09/2013 1227   GFRNONAA >60 03/12/2023 0414   GFRAA 80 (L) 01/30/2014 0521   No results found for: "HGBA1C" No results  found for: "INSULIN" No results found for: "TSH" CBC    Component Value Date/Time   WBC 9.8 03/12/2023 0414   RBC 3.62 (L) 03/12/2023 0414   HGB 11.0 (L) 03/12/2023 0414   HGB 13.0 06/09/2013 1227   HCT 33.3 (L) 03/12/2023 0414   HCT 39.2 06/09/2013 1227   PLT 146 (L) 03/12/2023 0414   PLT 222 06/09/2013 1227   MCV 92.0 03/12/2023 0414   MCV 88.7 06/09/2013 1227   MCH 30.4 03/12/2023 0414   MCHC 33.0 03/12/2023 0414   RDW 13.0 03/12/2023 0414   RDW 13.1 06/09/2013 1227   Iron Studies No results found for: "IRON", "TIBC", "FERRITIN", "IRONPCTSAT" Lipid Panel  No results found for: "CHOL", "TRIG", "HDL", "CHOLHDL", "VLDL", "LDLCALC", "LDLDIRECT" Hepatic Function Panel     Component Value Date/Time   PROT 5.4 (L) 03/11/2023 0431   PROT 6.2 (L) 06/09/2013 1227   ALBUMIN 3.0 (L) 03/11/2023 0431   ALBUMIN 3.2 (L) 06/09/2013 1227   AST 23 03/11/2023 0431   AST 16 06/09/2013 1227   ALT 16 03/11/2023 0431   ALT 13 06/09/2013 1227   ALKPHOS 40 03/11/2023 0431   ALKPHOS 47 06/09/2013 1227   BILITOT 0.9 03/11/2023 0431   BILITOT 0.39 06/09/2013 1227   No results found for: "TSH" Nutritional No results found for: "VD25OH"   Assessment and Plan    Obesity Obesity with previous lap band placement and removal due to esophageal complications. Current weight is 241 pounds, aiming for 150 pounds within a year. BMI qualifies for consideration of gastric sleeve surgery. Emotional eating and portion control issues present. Low metabolic rate contributes to weight gain. Previous success with low carb diet and intermittent fasting. Discussed gastric sleeve surgery; noted the majority of lap bands removed due to complications like esophageal dilation and band erosion. Gastric sleeve is viable post-recovery from band removal, contingent on BMI qualification. - Initiate low carb diet for two weeks - Discuss potential gastric sleeve surgery - Order labs to assess metabolic rate and other  relevant parameters - Follow up in two weeks to review test results and adjust plan  Hypertension Hypertension as a comorbidity of obesity. Weight loss expected to improve blood pressure control. - Monitor blood pressure - Encourage weight loss through diet and exercise  Hyperlipidemia Hyperlipidemia as a comorbidity of obesity. Weight loss expected to improve lipid profile. - Monitor lipid levels - Encourage weight loss through diet and exercise  Hyperglycemia Hyperglycemia as a comorbidity of obesity. Weight loss expected to improve blood glucose levels. - Monitor blood glucose levels - Encourage weight loss through diet and exercise  Fatigue Fatigue likely related to obesity and low metabolic rate. Improved diet and increased physical activity expected to alleviate symptoms. - Encourage adherence to low carb diet - Reassess fatigue in follow-up visit  Vitamin D deficiency High risk for vitamin D deficiency due to obesity. Vitamin D levels to be checked as part of lab workup. - Check vitamin D levels - Consider vitamin D supplementation if levels are  low  Arthritis Arthritis causing pain in neck, feet, and hands, exacerbated by weather changes. 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 Possible scar tissue or body composition changes. No recent mammogram noted. Discussed potential for ultrasound if recent mammogram is unavailable to rule out complications. - She was advised to consult primary care provider for evaluation   Alpha-gal syndrome Alpha-gal syndrome currently in remission. No dietary restrictions due to this condition. - Monitor for recurrence of symptoms  Follow-up Follow-up planned to assess weight loss progress and review lab results. - Schedule follow-up  appointment in two weeks - Review lab results and adjust treatment plan         I have personally spent 60 minutes total time today in preparation, patient care, and documentation for this visit, including the following: review of clinical lab tests; review of medical tests/procedures/services.    She was informed of the importance of frequent follow up visits to maximize her success with intensive lifestyle modifications for her multiple health conditions.    Quillian Quince, MD

## 2023-11-20 LAB — CBC WITH DIFFERENTIAL/PLATELET
Basophils Absolute: 0.1 10*3/uL (ref 0.0–0.2)
Basos: 1 %
EOS (ABSOLUTE): 0.1 10*3/uL (ref 0.0–0.4)
Eos: 2 %
Hematocrit: 43.8 % (ref 34.0–46.6)
Hemoglobin: 14.1 g/dL (ref 11.1–15.9)
Immature Grans (Abs): 0 10*3/uL (ref 0.0–0.1)
Immature Granulocytes: 0 %
Lymphocytes Absolute: 2.5 10*3/uL (ref 0.7–3.1)
Lymphs: 36 %
MCH: 30.4 pg (ref 26.6–33.0)
MCHC: 32.2 g/dL (ref 31.5–35.7)
MCV: 94 fL (ref 79–97)
Monocytes Absolute: 0.6 10*3/uL (ref 0.1–0.9)
Monocytes: 8 %
Neutrophils Absolute: 3.7 10*3/uL (ref 1.4–7.0)
Neutrophils: 53 %
Platelets: 244 10*3/uL (ref 150–450)
RBC: 4.64 x10E6/uL (ref 3.77–5.28)
RDW: 12 % (ref 11.7–15.4)
WBC: 7 10*3/uL (ref 3.4–10.8)

## 2023-11-20 LAB — CMP14+EGFR
ALT: 20 IU/L (ref 0–32)
AST: 24 IU/L (ref 0–40)
Albumin: 4.3 g/dL (ref 3.8–4.8)
Alkaline Phosphatase: 66 IU/L (ref 44–121)
BUN/Creatinine Ratio: 32 — ABNORMAL HIGH (ref 12–28)
BUN: 23 mg/dL (ref 8–27)
Bilirubin Total: 0.4 mg/dL (ref 0.0–1.2)
CO2: 23 mmol/L (ref 20–29)
Calcium: 10.2 mg/dL (ref 8.7–10.3)
Chloride: 100 mmol/L (ref 96–106)
Creatinine, Ser: 0.71 mg/dL (ref 0.57–1.00)
Globulin, Total: 2.4 g/dL (ref 1.5–4.5)
Glucose: 92 mg/dL (ref 70–99)
Potassium: 4.1 mmol/L (ref 3.5–5.2)
Sodium: 138 mmol/L (ref 134–144)
Total Protein: 6.7 g/dL (ref 6.0–8.5)
eGFR: 88 mL/min/{1.73_m2} (ref >59)

## 2023-11-20 LAB — LIPID PANEL WITH LDL/HDL RATIO
Cholesterol, Total: 191 mg/dL (ref 100–199)
HDL: 70 mg/dL (ref >39)
LDL Chol Calc (NIH): 100 mg/dL — ABNORMAL HIGH (ref 0–99)
LDL/HDL Ratio: 1.4 ratio (ref 0.0–3.2)
Triglycerides: 121 mg/dL (ref 0–149)
VLDL Cholesterol Cal: 21 mg/dL (ref 5–40)

## 2023-11-20 LAB — T4, FREE: Free T4: 1.27 ng/dL (ref 0.82–1.77)

## 2023-11-20 LAB — VITAMIN D 25 HYDROXY (VIT D DEFICIENCY, FRACTURES): Vit D, 25-Hydroxy: 50.6 ng/mL (ref 30.0–100.0)

## 2023-11-20 LAB — VITAMIN B12: Vitamin B-12: 1005 pg/mL (ref 232–1245)

## 2023-11-20 LAB — INSULIN, RANDOM: INSULIN: 15.5 u[IU]/mL (ref 2.6–24.9)

## 2023-11-20 LAB — T3: T3, Total: 112 ng/dL (ref 71–180)

## 2023-11-20 LAB — FOLATE: Folate: >20 ng/mL (ref >3.0)

## 2023-11-20 LAB — TSH: TSH: 2.8 u[IU]/mL (ref 0.450–4.500)

## 2023-11-20 LAB — HEMOGLOBIN A1C
Est. average glucose Bld gHb Est-mCnc: 114 mg/dL
Hgb A1c MFr Bld: 5.6 % (ref 4.8–5.6)

## 2023-11-29 DIAGNOSIS — F4011 Social phobia, generalized: Secondary | ICD-10-CM | POA: Diagnosis not present

## 2023-11-29 DIAGNOSIS — F331 Major depressive disorder, recurrent, moderate: Secondary | ICD-10-CM | POA: Diagnosis not present

## 2023-12-02 ENCOUNTER — Encounter (INDEPENDENT_AMBULATORY_CARE_PROVIDER_SITE_OTHER): Payer: Self-pay | Admitting: Family Medicine

## 2023-12-02 ENCOUNTER — Ambulatory Visit (INDEPENDENT_AMBULATORY_CARE_PROVIDER_SITE_OTHER): Payer: Medicare PPO | Admitting: Family Medicine

## 2023-12-02 VITALS — BP 128/71 | HR 61 | Temp 98.2°F | Ht 63.0 in | Wt 238.0 lb

## 2023-12-02 DIAGNOSIS — E782 Mixed hyperlipidemia: Secondary | ICD-10-CM

## 2023-12-02 DIAGNOSIS — E88819 Insulin resistance, unspecified: Secondary | ICD-10-CM | POA: Diagnosis not present

## 2023-12-02 DIAGNOSIS — Z6841 Body Mass Index (BMI) 40.0 and over, adult: Secondary | ICD-10-CM | POA: Diagnosis not present

## 2023-12-02 DIAGNOSIS — E785 Hyperlipidemia, unspecified: Secondary | ICD-10-CM | POA: Diagnosis not present

## 2023-12-02 DIAGNOSIS — E66813 Obesity, class 3: Secondary | ICD-10-CM

## 2023-12-02 DIAGNOSIS — E669 Obesity, unspecified: Secondary | ICD-10-CM

## 2023-12-02 DIAGNOSIS — N3281 Overactive bladder: Secondary | ICD-10-CM | POA: Diagnosis not present

## 2023-12-02 NOTE — Progress Notes (Signed)
 Office: [phone]  /  Fax: [phone]  WEIGHT SUMMARY AND BIOMETRICS  Anthropometric Measurements Height: 5\' 3"  (1.6 m) Weight: 238 lb (108 kg) BMI (Calculated): 42.17 Weight at Last Visit: 241 lb Weight Lost Since Last Visit: 3 lb Weight Gained Since Last Visit: 0 Starting Weight: 241 lb Total Weight Loss (lbs): 3 lb (1.361 kg) Peak Weight: 260 lb   Body Composition  Body Fat %: 54.3 % Fat Mass (lbs): 129.6 lbs Muscle Mass (lbs): 103.4 lbs Total Body Water (lbs): 83 lbs Visceral Fat Rating : 20   Other Clinical Data RMR: 1483 Fasting: no Labs: no Today's Visit #: 2 Starting Date: 11/18/23    Chief Complaint: OBESITY   History of Present Illness [name] is a 78 year old female who presents for discussion of her obesity diagnosis and treatment.  She has lost three pounds over the last two months since her last visit. She follows a low-carb diet plan about fifty percent of the time and is not engaging in formal exercise but is trying to increase her physical activity. She feels less bloated recently.  She has a history of insulin resistance and struggles with maintaining blood sugar levels despite dietary changes, including low-carb and intermittent fasting. She has never achieved a fasting blood sugar level below 100, even after significant weight loss.  She is concerned about her cholesterol levels and is currently taking 5 mg of rosuvastatin. She monitors her diet, particularly her intake of fatty foods, as she used to consume hard-boiled eggs as a snack. She is cautious about her cholesterol intake due to previous test results.  She has a history of overactive bladder and is currently taking Gemtesa, having switched from Myrbetriq due to side effects. She experiences nocturia, waking up four times at night to urinate, which affects her sleep quality. She has been referred to a gynecological urologist for further evaluation.  She has experienced a  decrease in height, losing an inch over a short period, which concerns her. She attributes this to weight gain and reduced mobility following an accident that limited her movement for several months. She is now more active and is attempting to walk more regularly.      PHYSICAL EXAM:  Blood pressure 128/71, pulse 61, temperature 98.2 F (36.8 C), height 5\' 3"  (1.6 m), weight 238 lb (108 kg), SpO2 96%. Body mass index is 42.16 kg/m.  DIAGNOSTIC DATA REVIEWED:  BMET    Component Value Date/Time   NA 138 11/18/2023 1101   NA 139 06/09/2013 1227   K 4.1 11/18/2023 1101   K 3.7 06/09/2013 1227   CL 100 11/18/2023 1101   CL 99 09/09/2012 1245   CO2 23 11/18/2023 1101   CO2 27 06/09/2013 1227   GLUCOSE 92 11/18/2023 1101   GLUCOSE 112 (H) 03/12/2023 0414   GLUCOSE 98 06/09/2013 1227   GLUCOSE 96 09/09/2012 1245   BUN 23 11/18/2023 1101   BUN 17.4 06/09/2013 1227   CREATININE 0.71 11/18/2023 1101   CREATININE 0.8 06/09/2013 1227   CALCIUM 10.2 11/18/2023 1101   CALCIUM 9.8 06/09/2013 1227   GFRNONAA >60 03/12/2023 0414   GFRAA 80 (L) 01/30/2014 0521   Lab Results  Component Value Date   HGBA1C 5.6 11/18/2023   Lab Results  Component Value Date   INSULIN 15.5 11/18/2023   Lab Results  Component Value Date   TSH 2.800 11/18/2023   CBC    Component Value Date/Time   WBC 7.0 11/18/2023 1101  WBC 9.8 03/12/2023 0414   RBC 4.64 11/18/2023 1101   RBC 3.62 (L) 03/12/2023 0414   HGB 14.1 11/18/2023 1101   HGB 13.0 06/09/2013 1227   HCT 43.8 11/18/2023 1101   HCT 39.2 06/09/2013 1227   PLT 244 11/18/2023 1101   MCV 94 11/18/2023 1101   MCV 88.7 06/09/2013 1227   MCH 30.4 11/18/2023 1101   MCH 30.4 03/12/2023 0414   MCHC 32.2 11/18/2023 1101   MCHC 33.0 03/12/2023 0414   RDW 12.0 11/18/2023 1101   RDW 13.1 06/09/2013 1227   Iron Studies No results found for: "IRON", "TIBC", "FERRITIN", "IRONPCTSAT" Lipid Panel     Component Value Date/Time   CHOL 191  11/18/2023 1101   TRIG 121 11/18/2023 1101   HDL 70 11/18/2023 1101   LDLCALC 100 (H) 11/18/2023 1101   Hepatic Function Panel     Component Value Date/Time   PROT 6.7 11/18/2023 1101   PROT 6.2 (L) 06/09/2013 1227   ALBUMIN 4.3 11/18/2023 1101   ALBUMIN 3.2 (L) 06/09/2013 1227   AST 24 11/18/2023 1101   AST 16 06/09/2013 1227   ALT 20 11/18/2023 1101   ALT 13 06/09/2013 1227   ALKPHOS 66 11/18/2023 1101   ALKPHOS 47 06/09/2013 1227   BILITOT 0.4 11/18/2023 1101   BILITOT 0.39 06/09/2013 1227      Component Value Date/Time   TSH 2.800 11/18/2023 1101   Nutritional Lab Results  Component Value Date   VD25OH 50.6 11/18/2023     Assessment and Plan Assessment & Plan Obesity Obesity with recent weight loss of three pounds over two months. She follows a low-carb diet about 50% of the time and is increasing physical activity. Discussed challenges of maintaining a low-carb diet and its impact on cholesterol levels. Encouraged dietary modifications and increased physical activity. Discussed exercise's role in improving insulin sensitivity and weight management. - Continue low-carb diet - Gradually increase physical activity, starting with walking to the mailbox  Insulin Resistance Insulin resistance with fasting insulin level of 15, indicating the pancreas is working three times harder than normal to maintain blood sugar levels. Hemoglobin A1c is 5.6, indicating borderline prediabetes. Discussed insulin's role in weight gain and hunger signals. She is interested in medication to assist with insulin resistance. Explained that metformin is generally well-tolerated, with about 20% experiencing queasiness if taken on an empty stomach. Advised to take with food to minimize side effects. - Start metformin 500 mg once daily with a meal - Educate on potential side effects of metformin, including queasiness, and advise to take with food - Monitor blood sugar levels and adjust treatment as  needed  Hyperlipidemia Hyperlipidemia with LDL at 100, HDL at 70, and triglycerides at 121. Current treatment includes rosuvastatin 5 mg. Discussed dietary impact on cholesterol levels and potential need to adjust medication if dietary changes are insufficient. Explained that total cholesterol is less relevant compared to HDL, LDL, and triglycerides. - Continue rosuvastatin 5 mg daily - Reassess cholesterol levels in three months to determine if medication adjustment is needed  Overactive Bladder Overactive bladder with nocturia, currently managed with Gemtesa. Referral to a urogynecologist has been made for further evaluation and management. Discussed the impact of hydration on kidney function and challenges of managing fluid intake with overactive bladder. She is taking Gemtesa due to fewer side effects compared to Myrbetriq. - Continue Gemtesa - Follow up with urogynecologist for further management -Continue to hydrate early in the day nad will factor this into her exercise  plan when the time comes.     I have personally spent 46 minutes total time today in preparation, patient care, and documentation for this visit, including the following: review of clinical lab tests; review of medical tests/procedures/services.    She was informed of the importance of frequent follow up visits to maximize her success with intensive lifestyle modifications for her multiple health conditions.    Quillian Quince, MD

## 2023-12-06 ENCOUNTER — Telehealth (INDEPENDENT_AMBULATORY_CARE_PROVIDER_SITE_OTHER): Payer: Self-pay | Admitting: Family Medicine

## 2023-12-06 ENCOUNTER — Encounter (INDEPENDENT_AMBULATORY_CARE_PROVIDER_SITE_OTHER): Payer: Self-pay | Admitting: Family Medicine

## 2023-12-06 DIAGNOSIS — F331 Major depressive disorder, recurrent, moderate: Secondary | ICD-10-CM | POA: Diagnosis not present

## 2023-12-06 DIAGNOSIS — F4011 Social phobia, generalized: Secondary | ICD-10-CM | POA: Diagnosis not present

## 2023-12-06 NOTE — Telephone Encounter (Signed)
 Patient called in stating she saw Dr. Dalbert Garnet on 12/02/23 and Dr. Dalbert Garnet was supposed to send in prescription for Metformin 500 mg. Please send script to [facility] [address] [place] [phone]

## 2023-12-07 MED ORDER — METFORMIN HCL 500 MG PO TABS
500.0000 mg | ORAL_TABLET | Freq: Every day | ORAL | 0 refills | Status: DC
Start: 2023-12-07 — End: 2024-01-05

## 2023-12-07 NOTE — Addendum Note (Signed)
 Addended by: Quillian Quince D on: 12/07/2023 09:37 AM   Modules accepted: Orders

## 2023-12-07 NOTE — Telephone Encounter (Signed)
 Phone call to let patient know her medication was sent to the pharmacy,

## 2023-12-07 NOTE — Telephone Encounter (Signed)
 Sorry about that, I have sent it in now.

## 2023-12-13 DIAGNOSIS — F331 Major depressive disorder, recurrent, moderate: Secondary | ICD-10-CM | POA: Diagnosis not present

## 2023-12-13 DIAGNOSIS — F4011 Social phobia, generalized: Secondary | ICD-10-CM | POA: Diagnosis not present

## 2023-12-16 ENCOUNTER — Encounter (INDEPENDENT_AMBULATORY_CARE_PROVIDER_SITE_OTHER): Payer: Self-pay | Admitting: Physician Assistant

## 2023-12-16 ENCOUNTER — Ambulatory Visit (INDEPENDENT_AMBULATORY_CARE_PROVIDER_SITE_OTHER): Admitting: Physician Assistant

## 2023-12-16 VITALS — BP 118/73 | HR 63 | Temp 98.0°F | Ht 63.0 in | Wt 239.0 lb

## 2023-12-16 DIAGNOSIS — Z6841 Body Mass Index (BMI) 40.0 and over, adult: Secondary | ICD-10-CM | POA: Diagnosis not present

## 2023-12-16 DIAGNOSIS — E88819 Insulin resistance, unspecified: Secondary | ICD-10-CM | POA: Diagnosis not present

## 2023-12-16 DIAGNOSIS — N3281 Overactive bladder: Secondary | ICD-10-CM

## 2023-12-16 DIAGNOSIS — E66813 Obesity, class 3: Secondary | ICD-10-CM

## 2023-12-16 DIAGNOSIS — Z91018 Allergy to other foods: Secondary | ICD-10-CM

## 2023-12-16 DIAGNOSIS — Z91014 Allergy to mammalian meats: Secondary | ICD-10-CM

## 2023-12-16 DIAGNOSIS — F339 Major depressive disorder, recurrent, unspecified: Secondary | ICD-10-CM

## 2023-12-16 DIAGNOSIS — F32A Depression, unspecified: Secondary | ICD-10-CM

## 2023-12-16 NOTE — Progress Notes (Signed)
 SUBJECTIVE: Discussed the use of AI scribe software for clinical note transcription with the patient, who gave verbal consent to proceed.  Chief Complaint: Obesity  Interim History: She is up 1 lb since last visit.   [name] is here to discuss her progress with her obesity treatment plan. She is on the following a lower carbohydrate, vegetable and lean protein rich diet plan and states she is following her eating plan approximately 60 % of the time. She states she is walking to the mailbox.  [name] is a 78 year old female with alpha-gal syndrome who presents with difficulty adhering to a nutrition plan.  She is experiencing difficulty adhering to a prescribed nutrition plan, which she finds too restrictive. She previously had success with a low-carb diet, losing 60 pounds, but finds the current plan challenging due to its limitations, such as the restriction on tomatoes, which she relies on. She has been using metformin, which has helped reduce cravings, but it has not fully addressed her dietary needs.  She has a history of alpha-gal syndrome, diagnosed in 2022, which has significantly impacted her diet. Prior to this diagnosis, she was treated for IBS with a FODMAP diet, which was ineffective. The alpha-gal syndrome requires her to avoid red meat, gelatin, and certain dairy products, complicating her dietary restrictions further. She has been in remission since December 2024, but finds it psychologically challenging to face new dietary restrictions.  She has been dealing with depression, exacerbated by a fall in June 2024, which resulted in broken toes on both feet, limiting her mobility. This, combined with the loss of two close cousins, contributed to significant weight gain. She was initially prescribed Prozac, which made her feel sluggish, but has since returned to Wellbutrin, which she finds more activating.  She also reports issues with overactive bladder, which affects her  hydration as increased fluid intake leads to frequent nighttime urination, impacting her sleep. She has been on [employer] for this condition, with limited success. She is scheduled to see a urogynecologist in May 2025 for further evaluation.  Pharmacotherapy: metformin for insulin resistance.  Bariatric surgery- Hx of Lap Band 2010 weight 260 lbs Nadir 180 lbs. Lap band removed 2024 due to dysphagia.  OBJECTIVE: Visit Diagnoses: Problem List Items Addressed This Visit     Allergy to alpha-gal -in remission   Class 3 severe obesity with serious comorbidity and body mass index (BMI) of 40.0 to 44.9 in adult (HCC)   OAB (overactive bladder)   Insulin resistance - Primary   BMI 40.0-44.9, adult (HCC)   Other Visit Diagnoses       Depression (HCC)         Obesity Management Struggling with adherence to a low-carb diet due to its restrictive nature. Previously lost 60 pounds on a self-directed low-carb diet including fats. Current plan conflicts with preferences, especially regarding restrictions on foods like tomatoes. Psychological resistance to dietary restrictions after managing alpha-gal syndrome and other dietary limitations.  Considering category one diet plan with modifications to align with current eating habits. - Implement category one diet plan with modifications to align with current eating habits with breakfast and lunch options as tends to avoid eating bread products.  - Using [employer] plant-based milk for coffee and other needs She is discouraged by little weight loss  and we discussed focusing on her non scale wins of getting out more often now. We discussed possible referral to Dr. Dewaine Conger as well . She is working with  a therapist regularly concerning her other issues but does understand there is a emotional/psychological component to her difficulty following a nutrition plan due to her restrictions/limitations in her diet in past and encouraged her to keep trying to  make positive changes.   Insulin Resistance Last fasting insulin was 15.5. A1c was 5.6 Polyphagia:No Medication(s): Metformin 500 mg once daily breakfast Lab Results  Component Value Date   HGBA1C 5.6 11/18/2023   Lab Results  Component Value Date   INSULIN 15.5 11/18/2023    Plan: Continue Metformin 500 mg once daily breakfast No refill needed today.  Continue working on nutrition plan to decrease simple carbohydrates, increase lean proteins and exercise to promote weight loss, improve glycemic control and prevent progression to Type 2 diabetes.   Alpha-gal Syndrome  She reports she was diagnosed in 2022 after being misdiagnosed with IBS for five years. Managed with dietary restrictions, avoiding red meat, gelatin, and whey. Recently went into remission but finds it psychologically challenging to reintroduce restrictions.  Overactive Bladder Experiencing overactive bladder symptoms, leading to frequent nocturia and subsequent daytime fatigue. On Myrbetriq and Gemtesa with limited success. Scheduled to see a urogynecologist in May for further evaluation and potential alternative treatments. Discussed potential alternative treatments such as Botox, which has been reported by other patients to significantly improve symptoms. - Continue current medication regimen until urogynecologist appointment - Discuss potential alternative treatments such as Botox with urogynecologist  Depression Experienced significant depression following a fall in June and the loss of two close cousins. Initially on Prozac, which was ineffective, and has since returned to Wellbutrin, which is more activating and better suited to her needs. Currently seeing a psychologist who was a medical doctor in [place], beneficial in addressing the mental health impact of her health issues. May also want to see Dr. Dewaine Conger at some point to address challenges she feels facing having to follow nutrition plan.   Follow-up Has a  follow-up appointment scheduled with Doctor Dalbert Garnet on April 23rd.   Vitals Temp: 98 F (36.7 C) BP: 118/73 Pulse Rate: 63 SpO2: 96 %   Anthropometric Measurements Height: 5\' 3"  (1.6 m) Weight: 239 lb (108.4 kg) BMI (Calculated): 42.35 Weight at Last Visit: 238 lb Weight Lost Since Last Visit: 0 Weight Gained Since Last Visit: 1 Starting Weight: 241 lb Total Weight Loss (lbs): 2 lb (0.907 kg) Peak Weight: 260 lb   Body Composition  Body Fat %: 55.4 % Fat Mass (lbs): 132.6 lbs Muscle Mass (lbs): 101.2 lbs Visceral Fat Rating : 20   Other Clinical Data Today's Visit #: 3 Starting Date: 11/18/23     ASSESSMENT AND PLAN:  Diet: Chenel is currently in the action stage of change. As such, her goal is to continue with weight loss efforts. She has agreed to Category 1 Plan.  Exercise: [name] has been instructed to try a geriatric exercise plan and that some exercise is better than none for weight loss and overall health benefits.   Behavior Modification:  We discussed the following Behavioral Modification Strategies today: increasing lean protein intake, decreasing simple carbohydrates, increasing vegetables, increase H2O intake, increase high fiber foods, avoiding temptations, and planning for success. We discussed various medication options to help [facility] with her weight loss efforts and we both agreed to continue to work on nutritional and behavioral strategies to promote weight loss.  .  Return in about 3 weeks (around 01/06/2024).Marland Kitchen She was informed of the importance of frequent follow up visits to maximize her success with intensive  lifestyle modifications for her multiple health conditions.  Attestation Statements:   Reviewed by clinician on day of visit: allergies, medications, problem list, medical history, surgical history, family history, social history, and previous encounter notes.   Time spent on visit including pre-visit chart review and post-visit care and  charting was 35 minutes.    Ketan Renz, PA-C

## 2023-12-22 ENCOUNTER — Other Ambulatory Visit (INDEPENDENT_AMBULATORY_CARE_PROVIDER_SITE_OTHER): Payer: Self-pay | Admitting: Family Medicine

## 2023-12-22 DIAGNOSIS — E88819 Insulin resistance, unspecified: Secondary | ICD-10-CM

## 2023-12-27 DIAGNOSIS — F4011 Social phobia, generalized: Secondary | ICD-10-CM | POA: Diagnosis not present

## 2023-12-27 DIAGNOSIS — F331 Major depressive disorder, recurrent, moderate: Secondary | ICD-10-CM | POA: Diagnosis not present

## 2024-01-02 ENCOUNTER — Other Ambulatory Visit (INDEPENDENT_AMBULATORY_CARE_PROVIDER_SITE_OTHER): Payer: Self-pay | Admitting: Family Medicine

## 2024-01-02 DIAGNOSIS — E88819 Insulin resistance, unspecified: Secondary | ICD-10-CM

## 2024-01-05 ENCOUNTER — Encounter (INDEPENDENT_AMBULATORY_CARE_PROVIDER_SITE_OTHER): Payer: Self-pay | Admitting: Family Medicine

## 2024-01-05 ENCOUNTER — Ambulatory Visit (INDEPENDENT_AMBULATORY_CARE_PROVIDER_SITE_OTHER): Admitting: Family Medicine

## 2024-01-05 VITALS — BP 132/74 | HR 74 | Temp 98.7°F | Ht 63.0 in | Wt 236.0 lb

## 2024-01-05 DIAGNOSIS — E669 Obesity, unspecified: Secondary | ICD-10-CM

## 2024-01-05 DIAGNOSIS — E66813 Obesity, class 3: Secondary | ICD-10-CM

## 2024-01-05 DIAGNOSIS — M1990 Unspecified osteoarthritis, unspecified site: Secondary | ICD-10-CM | POA: Diagnosis not present

## 2024-01-05 DIAGNOSIS — E88819 Insulin resistance, unspecified: Secondary | ICD-10-CM

## 2024-01-05 DIAGNOSIS — Z736 Limitation of activities due to disability: Secondary | ICD-10-CM | POA: Diagnosis not present

## 2024-01-05 DIAGNOSIS — Z6841 Body Mass Index (BMI) 40.0 and over, adult: Secondary | ICD-10-CM

## 2024-01-05 DIAGNOSIS — M0609 Rheumatoid arthritis without rheumatoid factor, multiple sites: Secondary | ICD-10-CM

## 2024-01-05 MED ORDER — METFORMIN HCL 500 MG PO TABS: 500.0000 mg | ORAL_TABLET | Freq: Every day | ORAL | 0 refills | Status: DC

## 2024-01-05 NOTE — Progress Notes (Signed)
 Office: [phone]  /  Fax: [phone]  WEIGHT SUMMARY AND BIOMETRICS  Anthropometric Measurements Height: 5\' 3"  (1.6 m) Weight: 236 lb (107 kg) BMI (Calculated): 41.82 Weight at Last Visit: 239 lb Weight Lost Since Last Visit: 3 lb Weight Gained Since Last Visit: 0 Starting Weight: 241 lb Total Weight Loss (lbs): 5 lb (2.268 kg) Peak Weight: 260 lb   Body Composition  Body Fat %: 55.3 % Fat Mass (lbs): 130.8 lbs Muscle Mass (lbs): 100.4 lbs Visceral Fat Rating : 20   Other Clinical Data RMR: [id number] Fasting: no Labs: no Today's Visit #: 4 Starting Date: 11/18/23    Chief Complaint: OBESITY   History of Present Illness [name] is a 78 year old female who presents for obesity treatment and management of arthritis pain.  She has been following a low-carb diet approximately 80% of the time and has lost three pounds over the last three weeks. She is not currently exercising but is actively managing her diet, focusing on reducing sodium intake and increasing protein, while maintaining a calorie intake of around 1200 per day. She uses [employer] to track her diet and has been adjusting her intake based on previous metabolic testing results.  She experiences significant arthritis pain, particularly in her back and feet, which are swollen and painful, making ambulation difficult. The pain has been severe over the past three to four days, exacerbated by walking on uneven ground. She describes the pain as similar to fibromyalgia, which she has experienced in the past. She uses Tylenol  and tramadol  for pain management, with tramadol  being prescribed under a contract with her previous doctor. The pain sometimes disrupts her sleep, requiring additional doses of tramadol . She has faced challenges in obtaining tramadol  prescriptions after her previous doctor left, as some clinics refused to prescribe it, preferring to offer injections instead. She has not identified any  specific triggers for her pain, which can occur without any strenuous activity.  She has a history of fibromyalgia and arthritis, with pain management primarily through tramadol . She is currently taking metformin  for insulin  resistance and reports no issues with this medication. She has difficulty with physical activity due to pain and swelling in her feet, which is exacerbated by walking on inclines. She is concerned about falling due to instability on uneven surfaces and is interested in finding suitable exercises that can be done safely given her limitations.      PHYSICAL EXAM:  Blood pressure 132/74, pulse 74, temperature 98.7 F (37.1 C), height 5\' 3"  (1.6 m), weight 236 lb (107 kg), SpO2 96%. Body mass index is 41.81 kg/m.  DIAGNOSTIC DATA REVIEWED:  BMET    Component Value Date/Time   NA 138 11/18/2023 1101   NA 139 06/09/2013 1227   K 4.1 11/18/2023 1101   K 3.7 06/09/2013 1227   CL 100 11/18/2023 1101   CL 99 09/09/2012 1245   CO2 23 11/18/2023 1101   CO2 27 06/09/2013 1227   GLUCOSE 92 11/18/2023 1101   GLUCOSE 112 (H) 03/12/2023 0414   GLUCOSE 98 06/09/2013 1227   GLUCOSE 96 09/09/2012 1245   BUN 23 11/18/2023 1101   BUN 17.4 06/09/2013 1227   CREATININE 0.71 11/18/2023 1101   CREATININE 0.8 06/09/2013 1227   CALCIUM  10.2 11/18/2023 1101   CALCIUM  9.8 06/09/2013 1227   GFRNONAA >60 03/12/2023 0414   GFRAA 80 (L) 01/30/2014 0521   Lab Results  Component Value Date   HGBA1C 5.6 11/18/2023   Lab Results  Component Value  Date   INSULIN  15.5 11/18/2023   Lab Results  Component Value Date   TSH 2.800 11/18/2023   CBC    Component Value Date/Time   WBC 7.0 11/18/2023 1101   WBC 9.8 03/12/2023 0414   RBC 4.64 11/18/2023 1101   RBC 3.62 (L) 03/12/2023 0414   HGB 14.1 11/18/2023 1101   HGB 13.0 06/09/2013 1227   HCT 43.8 11/18/2023 1101   HCT 39.2 06/09/2013 1227   PLT 244 11/18/2023 1101   MCV 94 11/18/2023 1101   MCV 88.7 06/09/2013 1227   MCH 30.4  11/18/2023 1101   MCH 30.4 03/12/2023 0414   MCHC 32.2 11/18/2023 1101   MCHC 33.0 03/12/2023 0414   RDW 12.0 11/18/2023 1101   RDW 13.1 06/09/2013 1227   Iron Studies No results found for: "IRON", "TIBC", "FERRITIN", "IRONPCTSAT" Lipid Panel     Component Value Date/Time   CHOL 191 11/18/2023 1101   TRIG 121 11/18/2023 1101   HDL 70 11/18/2023 1101   LDLCALC 100 (H) 11/18/2023 1101   Hepatic Function Panel     Component Value Date/Time   PROT 6.7 11/18/2023 1101   PROT 6.2 (L) 06/09/2013 1227   ALBUMIN 4.3 11/18/2023 1101   ALBUMIN 3.2 (L) 06/09/2013 1227   AST 24 11/18/2023 1101   AST 16 06/09/2013 1227   ALT 20 11/18/2023 1101   ALT 13 06/09/2013 1227   ALKPHOS 66 11/18/2023 1101   ALKPHOS 47 06/09/2013 1227   BILITOT 0.4 11/18/2023 1101   BILITOT 0.39 06/09/2013 1227      Component Value Date/Time   TSH 2.800 11/18/2023 1101   Nutritional Lab Results  Component Value Date   VD25OH 50.6 11/18/2023     Assessment and Plan Assessment & Plan Arthritis pain Chronic arthritis pain with recent exacerbation affecting multiple joints, including hands and feet. Pain management primarily with tramadol , used sparingly. Previous pain management attempts were unsatisfactory due to limited treatment options. Discussed potential new pain management modalities and the importance of exploring options beyond injections. Encouraged to consider seeing a pain specialist for comprehensive management. - Consider referral to a pain specialist for comprehensive pain management. -Continue working on healthy diet and exercise to decrease systemic inflammation  Obesity Obesity management with a focus on dietary modifications. She is following a low-carb diet successfully 80% of the time and has lost three pounds in the last three weeks, with two pounds of fat loss and one pound of water loss. Encouraged to continue with current dietary plan, focusing on net carbs and maintaining protein  intake. Discussed the importance of not reducing calorie intake below 1200 calories to prevent metabolic slowdown. Sodium intake should be reduced to 1800 mg due to concerns about hypertension and heart disease. Encouraged to aim for a half to one pound of fat loss per week to avoid metabolic slowdown. - Continue low-carb diet with focus on net carbs and adequate protein intake. - Maintain calorie intake between 1200-1300 calories per day. - Reduce sodium intake to 1800 mg per day. - Aim for 0.5-1 pound of fat loss per week.  Insulin  resistance Insulin  resistance managed with metformin . No reported issues with metformin . Continued weight loss is beneficial for improving insulin  sensitivity. - Refill metformin  for 90 days.  Physical activity limitations due to arthritis pain Discussion on physical activity limitations due to arthritis pain. Encouraged to engage in safe exercises to improve mobility and strength. Discussed chair exercises and core strengthening exercises using a wobble cushion. - Explore chair  exercises and core strengthening exercises. - Consider purchasing a wobble cushion for core exercises.    She was informed of the importance of frequent follow up visits to maximize her success with intensive lifestyle modifications for her multiple health conditions.    Jasmine Mesi, MD

## 2024-01-10 DIAGNOSIS — F4011 Social phobia, generalized: Secondary | ICD-10-CM | POA: Diagnosis not present

## 2024-01-10 DIAGNOSIS — F331 Major depressive disorder, recurrent, moderate: Secondary | ICD-10-CM | POA: Diagnosis not present

## 2024-01-19 ENCOUNTER — Ambulatory Visit (INDEPENDENT_AMBULATORY_CARE_PROVIDER_SITE_OTHER): Admitting: Physician Assistant

## 2024-01-19 ENCOUNTER — Encounter (INDEPENDENT_AMBULATORY_CARE_PROVIDER_SITE_OTHER): Payer: Self-pay | Admitting: Physician Assistant

## 2024-01-19 VITALS — BP 128/73 | HR 68 | Temp 98.1°F | Ht 63.0 in | Wt 236.0 lb

## 2024-01-19 DIAGNOSIS — E669 Obesity, unspecified: Secondary | ICD-10-CM | POA: Diagnosis not present

## 2024-01-19 DIAGNOSIS — I10 Essential (primary) hypertension: Secondary | ICD-10-CM | POA: Diagnosis not present

## 2024-01-19 DIAGNOSIS — E88819 Insulin resistance, unspecified: Secondary | ICD-10-CM | POA: Diagnosis not present

## 2024-01-19 DIAGNOSIS — Z6841 Body Mass Index (BMI) 40.0 and over, adult: Secondary | ICD-10-CM

## 2024-01-19 DIAGNOSIS — M150 Primary generalized (osteo)arthritis: Secondary | ICD-10-CM | POA: Diagnosis not present

## 2024-01-19 DIAGNOSIS — E66813 Obesity, class 3: Secondary | ICD-10-CM

## 2024-01-19 NOTE — Progress Notes (Unsigned)
 SUBJECTIVE: Discussed the use of AI scribe software for clinical note transcription with the patient, who gave verbal consent to proceed.  Chief Complaint: Obesity  Interim History: She maintained her weight since last visit.  Down 5 lbs overall   [name] is here to discuss her progress with her obesity treatment plan. She is on the keeping a food journal and adhering to recommended goals of 1200 calories and 85 protein and states she is following her eating plan approximately 90 % of the time. She states she is exercising, seated exercise 10 minutes 7 times per week.  [name] is a 78 year old female who presents for follow-up of her obesity treatment plan.  She is actively managing her obesity by using My Fitness Pal to log her food intake. Her diet includes 85 grams of protein, 100 grams of carbohydrates, 20-25 grams of fiber, and 1800 mg of sodium daily. She finds it challenging to maintain the sodium limit, especially with processed foods, and struggles to balance calorie intake without increasing sodium. She is eating healthy foods, avoiding fast food, and preparing meals at home, including making her own bean salad and lean burgers. She is doing seated exercises daily, although she finds it insufficient for building muscle.  She has a history of insulin  resistance and is currently taking metformin  500 mg daily, which she started approximately six weeks ago. Her initial insulin  level was 15.5, with a goal of 5 or less, and her A1c was 5.6.  She experiences significant pain in her feet, which limits her mobility. She reports severe pain and swelling after minimal activity, such as running errands, which requires her to ice her feet and take tramadol , sometimes needing two doses. She has a history of arthritis and bone spurs in her feet, and previous attempts at physical therapy were limited by her inability to perform certain movements due to ligament issues.  Her past medical history  includes mixed hyperlipidemia, hypertension, osteoarthritis, fibromyalgia, and cancer treatment in 2012 with Herceptin . 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 She has some shortness of breath, which she attributes to deconditioning. Her echocardiogram shows normal cardiac function with an ejection fraction of 60-65%.  She lives alone in [place] and has been more active within her home and going out more frequently, despite her foot pain. She is an Technical sales engineer by profession.  Pharmacotherapy: metformin  for insulin  resistance.   Bariatric surgery- Hx of Lap Band 2010 weight 260 lbs Nadir 180 lbs. Lap band removed 2024 due to dysphagia.  OBJECTIVE: Visit Diagnoses: Problem List Items Addressed This Visit     Essential hypertension   Insulin  resistance - Primary   BMI 40.0-44.9, adult (HCC)   Other Visit Diagnoses       Primary osteoarthritis involving multiple joints       Relevant Orders   Ambulatory referral to Physical Medicine Rehab     obesity beginning bmi 42.7         Obesity She is adhering to a dietary plan with modifications approved by Dr. Alvia Awkward, utilizing My Fitness Pal for food intake logging. She is meeting protein goals but struggles with sodium intake, aiming for 1800 mg. She engages in daily seated exercises and has slightly increased her activity level. - Continue current dietary plan and use of My Fitness Pal - Continue daily seated exercises - Reassess progress in a few weeks - Consider  referral to [facility] for a tailored exercise program to safely work on increasing movement.   Insulin  resistance She has been on metformin  500 mg daily for four weeks. Initial insulin  level was 15.5, with a target of 5 or less. A1c was 5.6, indicating prediabetes. The goal is to preserve pancreatic function and prevent progression to type 2 diabetes. . Emphasized the  importance of nutrition and lifestyle changes. - Continue metformin  500 mg daily - Repeat labs, including A1c and insulin  level, in a couple of months  Hypertension Blood pressure is reportedly well-managed. She is attempting to manage sodium intake, aiming for 1800 mg daily, though this is challenging.  BP Readings from Last 3 Encounters:  01/20/24 129/82  01/19/24 128/73  01/05/24 132/74   Lab Results  Component Value Date   NA 138 11/18/2023   CL 100 11/18/2023   K 4.1 11/18/2023   CO2 23 11/18/2023   BUN 23 11/18/2023   CREATININE 0.71 11/18/2023   EGFR 88 11/18/2023   CALCIUM  10.2 11/18/2023   ALBUMIN 4.3 11/18/2023   GLUCOSE 92 11/18/2023    Continue monitoring blood pressure - Aim for sodium intake of 2300 mg or less if 1800 mg is not achievable  Osteoarthritis multiple joints Severe osteoarthritis in bilateral legs and feet, with significant pain and swelling after minimal activity. Previous orthopedic consultation suggested reconstructive surgery, but this is not advisable given limited overall mobility. . Referral to physical medicine and rehabilitation is planned to explore nonoperative pain management options. - Refer to physical medicine and rehabilitation for chronic pain management related to severe osteoarthritis in bilateral legs and feet  Fibromyalgia She experiences chronic pain, which may be exacerbated by fibromyalgia. Referral to physical medicine and rehabilitation may provide additional management strategies. - Include fibromyalgia in referral to physical medicine and rehabilitation  Breast cancer She underwent treatment in 2012, including Herceptin , which required cardiology monitoring. No current cardiac issues related to past cancer treatment.  Vitals Temp: 98.1 F (36.7 C) BP: 128/73 Pulse Rate: 68 SpO2: 96 %   Anthropometric Measurements Height: 5\' 3"  (1.6 m) Weight: 236 lb (107 kg) BMI (Calculated): 41.82 Weight at Last Visit: 236  lb Weight Lost Since Last Visit: 0 Weight Gained Since Last Visit: 0 Starting Weight: 241 lb Total Weight Loss (lbs): 5 lb (2.268 kg) Peak Weight: 260 lb   Body Composition  Body Fat %: 55.2 % Fat Mass (lbs): 130.4 lbs Muscle Mass (lbs): 100.6 lbs Visceral Fat Rating : 20   Other Clinical Data RMR: 1483 Fasting: no Labs: no Today's Visit #: 5 Starting Date: 11/18/23     ASSESSMENT AND PLAN:  Diet: Kearsten is currently in the action stage of change. As such, her goal is to continue with weight loss efforts. She has agreed to keeping a food journal and adhering to recommended goals of 1200 calories and 85 grams of protein.  Exercise: Chianne has been instructed to try a geriatric exercise plan and that some exercise is better than none for weight loss and overall health benefits.   Behavior Modification:  We discussed the following Behavioral Modification Strategies today: increasing lean protein intake, decreasing simple carbohydrates, increasing vegetables, increase H2O intake, increase high fiber foods, meal planning and cooking strategies, emotional eating strategies , avoiding temptations, planning for success, and keep a strict food journal. We discussed various medication options to help [facility] with her weight loss efforts and we both agreed to continue current treatment plan.  Return in about 3 weeks (around 02/09/2024).Aaron Aas  She was informed of the importance of frequent follow up visits to maximize her success with intensive lifestyle modifications for her multiple health conditions.  Attestation Statements:   Reviewed by clinician on day of visit: allergies, medications, problem list, medical history, surgical history, family history, social history, and previous encounter notes.   Time spent on visit including pre-visit chart review and post-visit care and charting was 38 minutes.    Maan Zarcone, PA-C

## 2024-01-20 ENCOUNTER — Encounter: Payer: Self-pay | Admitting: Obstetrics

## 2024-01-20 ENCOUNTER — Ambulatory Visit: Payer: Medicare PPO | Admitting: Obstetrics

## 2024-01-20 VITALS — BP 129/82 | HR 64

## 2024-01-20 DIAGNOSIS — N3281 Overactive bladder: Secondary | ICD-10-CM | POA: Diagnosis not present

## 2024-01-20 DIAGNOSIS — R102 Pelvic and perineal pain: Secondary | ICD-10-CM | POA: Diagnosis not present

## 2024-01-20 DIAGNOSIS — N952 Postmenopausal atrophic vaginitis: Secondary | ICD-10-CM

## 2024-01-20 DIAGNOSIS — Z6841 Body Mass Index (BMI) 40.0 and over, adult: Secondary | ICD-10-CM

## 2024-01-20 DIAGNOSIS — R351 Nocturia: Secondary | ICD-10-CM | POA: Diagnosis not present

## 2024-01-20 DIAGNOSIS — R3914 Feeling of incomplete bladder emptying: Secondary | ICD-10-CM | POA: Diagnosis not present

## 2024-01-20 LAB — POCT URINALYSIS DIPSTICK
Bilirubin, UA: NEGATIVE
Bilirubin, UA: NEGATIVE
Blood, UA: NEGATIVE
Blood, UA: NEGATIVE
Glucose, UA: NEGATIVE
Glucose, UA: NEGATIVE
Ketones, UA: NEGATIVE
Ketones, UA: NEGATIVE
Leukocytes, UA: NEGATIVE
Nitrite, UA: NEGATIVE
Nitrite, UA: NEGATIVE
Protein, UA: NEGATIVE
Protein, UA: NEGATIVE
Spec Grav, UA: 1.02 (ref 1.010–1.025)
Spec Grav, UA: 1.02 (ref 1.010–1.025)
Urobilinogen, UA: 0.2 U/dL
Urobilinogen, UA: 0.2 U/dL
pH, UA: 6.5 (ref 5.0–8.0)
pH, UA: 6 (ref 5.0–8.0)

## 2024-01-20 MED ORDER — ESTRADIOL 0.1 MG/GM VA CREA: TOPICAL_CREAM | VAGINAL | 3 refills | Status: AC

## 2024-01-20 NOTE — Assessment & Plan Note (Signed)
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   - We discussed the potential risks associated with hormone replacement including stroke, heart attack, and blood clots; and the fact that these risks are very low with vaginal estrogen use due to the very low systemic absorption rate of ~ 0.01% with a twice-week regimen. - start low dose vaginal estrogen 0.5g nightly x 2 weeks, followed by 0.5g twice a week

## 2024-01-20 NOTE — Assessment & Plan Note (Addendum)
-   reports symptoms with prior urology workup, denies [facility] teaching or need for foley placement - pending ROI from [facility] to review prior workup - Catheterized for 70mL to confirm after bladder scan 56mL due to history, repeat if clinical change - referral for pelvic floor PT due to possible association with pelvic floor myofascial pain

## 2024-01-20 NOTE — Patient Instructions (Signed)
 For night time frequency: - avoid fluid intake 3 hours before bedtime - continue to elevated your feet during the day or use compression socks to reduce lower extremity swelling - we will consider sleep study to rule out sleep apnea if you continue to experience night time symptoms  We discussed the symptoms of overactive bladder (OAB), which include urinary urgency, urinary frequency, night-time urination, with or without urge incontinence.  We discussed management including behavioral therapy (decreasing bladder irritants by following a bladder diet, urge suppression strategies, timed voids, bladder retraining), physical therapy, medication; and for refractory cases posterior tibial nerve stimulation, sacral neuromodulation, and intravesical botulinum toxin injection.   Continue Gemtesa nightly.   For vaginal atrophy (thinning of the vaginal tissue that can cause dryness and burning) and UTI prevention we discussed estrogen replacement in the form of vaginal cream.   Start vaginal estrogen therapy nightly for two weeks then 2 times weekly at night. This can be placed with your finger or an applicator inside the vagina and around the urethra.  Please let us  know if the prescription is too expensive and we can look for alternative options.   Is vaginal estrogen therapy safe for me? Vaginal estrogen preparations act on the vaginal skin, and only a very tiny amount is absorbed into the bloodstream (0.01%).  They work in a similar way to hand or face cream.  There is minimal absorption and they are therefore perfectly safe. If you have had breast cancer and have persistent troublesome symptoms which aren't settling with vaginal moisturisers and lubricants, local estrogen treatment may be a possibility, but consultation with your oncologist should take place first.   The origin of pelvic floor muscle spasm can be multifactorial, including primary, reactive to a different pain source, trauma, or even part  of a centralized pain syndrome.Treatment options include pelvic floor physical therapy, local (vaginal) or oral  muscle relaxants, pelvic muscle trigger point injections or centrally acting pain medications.     Please call [phone] to schedule the earliest appointment for pelvic floor PT.

## 2024-01-20 NOTE — Assessment & Plan Note (Signed)
-   onset of symptoms after fall and weight gain - encouraged pt to review medical management of weight reduction due to limitations in activity level - reviewed association with pelvic floor symptoms - discussed up to 50% reduction of urinary symptoms with 3-5% of weight loss

## 2024-01-20 NOTE — Progress Notes (Addendum)
 New Patient Evaluation and Consultation  Referring Provider: Tena Feeling, MD PCP: Tena Feeling, MD Date of Service: 01/20/2024  SUBJECTIVE Chief Complaint: New Patient (Initial Visit) [name] is a 78 y.o. female for evaluation of urinary incontinence.  )  History of Present Illness: [name] is a 78 y.o. White or Caucasian female seen in consultation at the request of Dr Candi Chafe for evaluation of urinary incontinence, overactive bladder and incomplete bladder emptying.   Tried overactive bladder medications in the past.   Evaluated by Dr. [facility], started on Gemtesa with improvement of dry eyes and reduction of daytime leakage. Previously on mirabegron  with relief, Vesicare (dry mouth, eyes, headaches) Denies pelvic floor PT or self cath teaching with history of incomplete emptying.  Reports history of uroflow around 2022.   Review of records significant for: Bilateral ankle edema, foot pain with fracture on PRN tramadol , s/p lap band removal 01/2023, history of stage I invasive ductal carcinoma ER/PR positive right breast cancer s/p mastectomy and chemotherapy in 2012, discontinued tamoxifen  in 2014  Urinary Symptoms: Leaks urine with going from sitting to standing, with a full bladder, with movement to the bathroom, and with urgency, most often when she gets up from her bed at night Leaks 1-2 time(s) per days started 5 years ago Pad use: 1-2 pads per day.   Patient is bothered by UI symptoms.  Day time voids 5-6.  Nocturia: 1-3 times per night to void most bothersome, started around 6-8 months ago and increased to overnight time pad use around 1 month ago. Reports fall 1 year ago after dehydration when she was walking her dog at night. Reports weight gain around 1 year ago, [facility] and started metformin  around 1 month ago.  Drinks until bedtime per GI Difficulty with compression socks due to toes after fall, tries to elevate feet above her  heart for > 74min/day  Denies history of snoring or OSA Voiding dysfunction:  does not empty bladder well.  Patient does not use a catheter to empty bladder.  When urinating, patient feels a weak stream, difficulty starting urine stream, dribbling after finishing, and the need to urinate multiple times in a row Drinks: >64oz water per day, 24oz decaf coffee  UTIs: 1 UTI's in the last year.   Denies history of blood in urine, kidney or bladder stones, pyelonephritis, bladder cancer, and kidney cancer No results found for the last 90 days.   Pelvic Organ Prolapse Symptoms:                  Patient Denies a feeling of a bulge the vaginal area.   Bowel Symptom: Bowel movements: 1 time(s) per day unless alpha-gal reaction  Stool consistency: soft  Straining: no.  Splinting: no.  Incomplete evacuation: no.  Patient Admits to accidental bowel leakage / fecal incontinence during alpha-gal reaction, last 1 year ago prior to fall. Since then resumed animal protein diet with issues  Occurs: 1 time(s) per year  Consistency with leakage: liquid Bowel regimen: magnesium  at bedtime Last colonoscopy: Date 01/28/2015, Results normal per documentation Cologuard negative 1 year ago per patient [facility]   This patient has no relevant Health Maintenance data.     Sexual Function Sexually active: no.  Sexual orientation: Straight Pain with sex: No  Pelvic Pain Denies pelvic pain  Past Medical History:  Past Medical History:  Diagnosis Date   Acne    Acute UTI 01/23/2014   to start  ABX 01/24/2014   Allergy    Anxiety    Arthritis    feet, knee and base of thumbs   Asthma    controlled   Bilateral swelling of feet    Breast cancer (HCC) 2012   Right Breast Cancer   Breast cancer, stage 1 (HCC) 07/18/2011   Carpal tunnel syndrome    right   Dehydration 10/21/2011   Depression    Fibromyalgia    Fibromyalgia    Hepatitis 1976   hepatitis B   History of hiatal hernia     Hyperlipidemia    Hypertension    Insulin  resistance    Mononucleosis 1952   Multiple allergies    Nephritis 1955   Osteopenia    Personal history of chemotherapy 2012   Right Breast Cancer   PONV (postoperative nausea and vomiting)    has had to use scop patch     Past Surgical History:   Past Surgical History:  Procedure Laterality Date   ABDOMINAL HYSTERECTOMY  09/15/1979   BREAST CAPSULECTOMY WITH IMPLANT EXCHANGE Right 03/02/2013   Procedure: BREAST CAPSULECTOMY WITH REPOSITIONING THE IMPLANT;  Surgeon: Marilou Showman, DO;  Location: [facility];  Service: Plastics;  Laterality: Right;   BREAST SURGERY     BUNIONECTOMY  09/14/1996   left foot   CATARACT EXTRACTION Left    ETHMOIDECTOMY  09/14/1989   EYE SURGERY     facial plastic surgery  09/14/1994   JOINT REPLACEMENT Left 09/15/2003   KNEE ARTHROPLASTY Right 01/29/2014   Procedure: COMPUTER ASSISTED TOTAL KNEE ARTHROPLASTY;  Surgeon: Adah Acron, MD;  Location: [facility];  Service: Orthopedics;  Laterality: Right;  Right Total Knee Arthroplasty   KNEE ARTHROSCOPY  1996, 2001   left   lap band surgery  09/14/2008   LIPOSUCTION Bilateral 03/02/2013   Procedure: LIPOSUCTION;  Surgeon: Marilou Showman, DO;  Location: [facility];  Service: Plastics;  Laterality: Bilateral;   MASTECTOMY Right 09/14/2010   w/chemo   MASTOPEXY  04/13/2012   Procedure: MASTOPEXY;  Surgeon: Marilou Showman, DO;  Location: [facility];  Service: Plastics;  Laterality: Left;   left breast  mastopexy reduction for symmetry   NASAL SINUS SURGERY  09/15/1999   port-a-cath placement  06/26/2011   tip in lower SVC per chest x-ray read by Dr. Gerrie Krebs   [facility] REMOVAL Left 03/02/2013   Procedure: REMOVAL PORT-A-CATH;  Surgeon: Enid Harry, MD;  Location: [facility];  Service: General;  Laterality: Left;   right breast lumpectomy  09/14/1984   right ear surgery  09/14/1981   tumor  removed (?)   right mastectomy  06/26/2011   TONSILLECTOMY  09/14/1948   TOTAL KNEE ARTHROPLASTY  09/14/2004   left   TUBAL LIGATION  09/14/1976     Past OB/GYN History: OB History  Gravida Para Term Preterm AB Living  2 2 2   2   SAB IAB Ectopic Multiple Live Births      2    # Outcome Date GA Lbr Len/2nd Weight Sex Type Anes PTL Lv  2 Term     M Vag-Spont   LIV  1 Term     F Vag-Spont   LIV    Vaginal deliveries: 2, largest infant > 8lbs, denies FI or UI postpartum Forceps/ Vacuum deliveries: 0, Cesarean section: 0 Menopausal: Yes, at age 23, Denies vaginal bleeding since menopause Contraception: s/p hysterectomy in her 30s in 35 due to pain from endometriosis, denies  oophorectomy. Last pap smear was 2012 per chart review.  Any history of abnormal pap smears: no. No results found for: "DIAGPAP", "HPVHIGH", "ADEQPAP"  Medications: Patient has a current medication list which includes the following prescription(s): amoxicillin, ascorbic acid, bupropion, cetirizine  hcl, vitamin d -3, co-enzyme q-10, diphenhydramine , epipen  2-pak, [START ON 01/24/2024] estradiol, famotidine , fluticasone , gemtesa, lisinopril -hydrochlorothiazide , magnesium  gluconate, metformin , multivitamin, omega-3 fatty acids, quercetin, rosuvastatin , tramadol , trazodone , tretinoin, tylenol , tyrvaya , and [DISCONTINUED] diphenhydramine .   Allergies: Patient is allergic to alpha-gal, demerol [meperidine], and morphine.   Social History:  Social History   Tobacco Use   Smoking status: Never   Smokeless tobacco: Never  Vaping Use   Vaping status: Never Used  Substance Use Topics   Alcohol use: No    Alcohol/week: 0.0 standard drinks of alcohol   Drug use: No    Relationship status: divorced Patient lives alone.   Patient is not employed. Regular exercise: No History of abuse: No  Family History:   Family History  Problem Relation Age of Onset   Depression Mother    Stroke Mother    Hypertension Mother     Heart disease Father    Hypertension Father    High Cholesterol Father    Breast cancer Paternal Grandmother    Breast cancer Paternal Aunt    Stroke Other    Hypertension Other    Heart disease Other    Uterine cancer Neg Hx    Rectal cancer Neg Hx    Bladder Cancer Neg Hx      Review of Systems: Review of Systems  Constitutional:  Positive for malaise/fatigue. Negative for fever and weight loss.       Weight gain  Respiratory:  Positive for shortness of breath. Negative for cough and wheezing.   Cardiovascular:  Positive for leg swelling. Negative for chest pain and palpitations.  Gastrointestinal:  Negative for abdominal pain, blood in stool and constipation.  Genitourinary:  Positive for frequency (night time) and urgency. Negative for dysuria and hematuria.  Skin:  Negative for rash.  Neurological:  Positive for dizziness and headaches. Negative for weakness.  Endo/Heme/Allergies:  Bruises/bleeds easily.  Psychiatric/Behavioral:  Positive for depression. The patient is nervous/anxious.      OBJECTIVE Physical Exam: Vitals:   01/20/24 1327  BP: 129/82  Pulse: 64   Physical Exam Constitutional:      General: She is not in acute distress.    Appearance: Normal appearance.  Genitourinary:     Bladder and urethral meatus normal.     No lesions in the vagina.     Genitourinary Comments: Fissure noted, no bleeding      Right Labia: No rash, tenderness, lesions, skin changes or Bartholin's cyst.    Left Labia: No tenderness, lesions, skin changes, Bartholin's cyst or rash.       No vaginal discharge, erythema, tenderness, bleeding, ulceration or granulation tissue.     No vaginal prolapse present.    Severe vaginal atrophy present.     Right Adnexa: not tender, not full and no mass present.    Left Adnexa: not tender, not full and no mass present.    Cervix is absent.     Uterus is absent.     Urethral meatus caruncle not present.    No urethral prolapse,  tenderness, mass, hypermobility, discharge or stress urinary incontinence with cough stress test present.     Bladder is not tender, urgency on palpation not present and masses not present.      Pelvic  Floor: Levator muscle strength is 2/5.    Levator ani is tender (L > R) and obturator internus is tender.     No asymmetrical contractions present and no pelvic spasms present. Cardiovascular:     Rate and Rhythm: Normal rate.  Pulmonary:     Effort: Pulmonary effort is normal. No respiratory distress.  Abdominal:     General: Abdomen is flat. There is no distension.     Palpations: Abdomen is soft. There is no mass.     Tenderness: There is no abdominal tenderness.     Hernia: No hernia is present.    Neurological:     Mental Status: She is alert.  Vitals reviewed. Exam conducted with a chaperone present.      POP-Q:   POP-Q  -3                                            Aa   -3                                           Ba  -8                                              C   1                                            Gh  4                                            Pb  8                                            tvl   -3                                            Ap  -3                                            Bp                                                 D    Post-Void Residual (PVR) by Bladder Scan: In order to evaluate bladder emptying, we discussed obtaining a postvoid residual and patient agreed to this procedure.  Procedure: The ultrasound unit was placed on the patient's abdomen in the suprapubic region after the patient  had voided.    Post Void Residual - 01/20/24 1356       Post Void Residual   Post Void Residual 56 mL            Straight Catheterization Procedure for PVR: After verbal consent was obtained from the patient for catheterization to assess bladder emptying and residual volume the urethra and surrounding tissues were  prepped with betadine and an in and out catheterization was performed.  PVR was 70mL.  Urine appeared clear yellow. The patient tolerated the procedure well.   Laboratory Results: Lab Results  Component Value Date   COLORU Yellow 01/20/2024   CLARITYU Clear 01/20/2024   GLUCOSEUR Negative 01/20/2024   BILIRUBINUR Negative 01/20/2024   KETONESU Negative 01/20/2024   SPECGRAV 1.020 01/20/2024   RBCUR Negative 01/20/2024   PHUR 6.0 01/20/2024   PROTEINUR Negative 01/20/2024   UROBILINOGEN 0.2 01/20/2024   LEUKOCYTESUR Negative 01/20/2024    Lab Results  Component Value Date   CREATININE 0.71 11/18/2023   CREATININE 0.53 03/12/2023   CREATININE 0.62 03/11/2023    Lab Results  Component Value Date   HGBA1C 5.6 11/18/2023    Lab Results  Component Value Date   HGB 14.1 11/18/2023     ASSESSMENT AND PLAN Ms. Hanisch is a 78 y.o. with:  1. OAB (overactive bladder)   2. Feeling of incomplete bladder emptying   3. Nocturia   4. Vaginal atrophy   5. Pelvic pain   6. BMI 40.0-44.9, adult (HCC)     OAB (overactive bladder) Assessment & Plan: - POCT UA negative - We discussed the symptoms of overactive bladder (OAB), which include urinary urgency, urinary frequency, nocturia, with or without urge incontinence.  While we do not know the exact etiology of OAB, several treatment options exist. We discussed management including behavioral therapy (decreasing bladder irritants, urge suppression strategies, timed voids, bladder retraining), physical therapy, medication; for refractory cases posterior tibial nerve stimulation, sacral neuromodulation, and intravesical botulinum toxin injection.  For anticholinergic medications, we discussed the potential side effects of anticholinergics including dry eyes, dry mouth, constipation, cognitive impairment and urinary retention. For Beta-3 agonist medication, we discussed the potential side effect of elevated blood pressure which is more  likely to occur in individuals with uncontrolled hypertension. - continue gemtesa due to reduced dry eyes compared to mirabegron  - failed vesicare - For refractory OAB we reviewed the procedure for intravesical Botox injection with cystoscopy in the office and reviewed the risks, benefits and alternatives of treatment including but not limited to infection, need for self-catheterization and need for repeat therapy.  We discussed that there is a 5-15% chance of needing to catheterize with Botox and that this usually resolves in a few months; however can persist for longer periods of time.  Typically Botox injections would need to be repeated every 3-12 months since this is not a permanent therapy.   We discussed the role of sacral neuromodulation and how it works. It requires a test phase, and documentation of bladder function via diary. After a successful test period, a permanent wire and generator are placed in the OR. The battery lasts 5 years on average and would need to be replaced surgically.  The goal of this therapy is at least a 50% improvement in symptoms. It is NOT realistic to expect a 100% cure.  We reviewed the fact that about 30% of patients fail the test phase and are not candidates for permanent generator placement.  We discussed the risk of  infection and that the patient would not be able to get an MRI once the device is placed. There are two companies that provide this therapy: [employer] and [employer]. [employer]' product is new and is similar to [employer]'s, but has advantages of a smaller and rechargeable battery and being able to have an MRI with the implant. For all procedures, we discussed risks of bleeding, infection, damage to surrounding organs including bowel, bladder, blood vessels, ureters and nerves, need for further surgery, risk of postoperative urinary incontinence or retention with need to catheterize, recurrent prolapse, numbness and weakness at any body site, buttock pain, and the  rarer risks of blood clot, heart attack, pneumonia, death.    We also discussed the role of percutaneous tibial nerve stimulation and how it works.  She understands it requires 12 weekly visits for temporary neuromodulation of the sacral nerve roots via the tibial nerve and that she may then require continued tapered treatment.   - pending ROI from [facility] and repeat PVR - encouraged weight reduction and discussed need for bladder diary prior to 3rd line therapy to r/o nocturnal polyuria  Orders: -     POCT urinalysis dipstick -     POCT urinalysis dipstick -     AMB referral to rehabilitation -     Estradiol; Place 0.5g nightly for two weeks then twice a week after  Dispense: 30 g; Refill: 3  Feeling of incomplete bladder emptying Assessment & Plan: - reports symptoms with prior urology workup, denies [facility] teaching or need for foley placement - pending ROI from [facility] to review prior workup - Catheterized for 70mL to confirm after bladder scan 56mL due to history, repeat if clinical change - referral for pelvic floor PT due to possible association with pelvic floor myofascial pain  Orders: -     POCT urinalysis dipstick -     POCT urinalysis dipstick -     AMB referral to rehabilitation  Nocturia Assessment & Plan: - avoid fluid intake 3 hours before bedtime - elevated feet during the day or use compression socks to reduce lower extremity swelling - Lisinopril -HCTZ dosing to 2pm - continue Gemtesa at bedtime - discussed sleep study to r/o sleep apnea if refractory symptoms - need for bladder diary prior to 3rd line therapy to r/o nocturnal polyuria  Orders: -     AMB referral to rehabilitation  Vaginal atrophy Assessment & Plan: - history of stage I invasive ductal carcinoma ER/PR positive right breast cancer s/p mastectomy and chemotherapy in 2012, discontinued tamoxifen  in 2014 - For symptomatic vaginal atrophy options include lubrication with a water-based  lubricant, personal hygiene measures and barrier protection against wetness, and estrogen replacement in the form of vaginal cream, vaginal tablets, or a time-released vaginal ring.   - We discussed the potential risks associated with hormone replacement including stroke, heart attack, and blood clots; and the fact that these risks are very low with vaginal estrogen use due to the very low systemic absorption rate of ~ 0.01% with a twice-week regimen. - start low dose vaginal estrogen 0.5g nightly x 2 weeks, followed by 0.5g twice a week  Orders: -     Estradiol; Place 0.5g nightly for two weeks then twice a week after  Dispense: 30 g; Refill: 3  Pelvic pain Assessment & Plan: - pain with bladder fullness and sensation of incomplete emptying with palpation of levator ani - The origin of pelvic floor muscle spasm can be multifactorial, including primary, reactive to  a different pain source, trauma, or even part of a centralized pain syndrome.Treatment options include pelvic floor physical therapy, local (vaginal) or oral  muscle relaxants, pelvic muscle trigger point injections or centrally acting pain medications.   - referral for pelvic floor PT - encouraged pelvic floor relaxation during voids   Orders: -     Estradiol; Place 0.5g nightly for two weeks then twice a week after  Dispense: 30 g; Refill: 3  BMI 40.0-44.9, adult (HCC) Assessment & Plan: - onset of symptoms after fall and weight gain - encouraged pt to review medical management of weight reduction due to limitations in activity level - reviewed association with pelvic floor symptoms - discussed up to 50% reduction of urinary symptoms with 3-5% of weight loss   Time spent: I spent 66 minutes dedicated to the care of this patient on the date of this encounter to include pre-visit review of records, face-to-face time with the patient discussing overactive bladder, pelvic pain, sensation of incomplete emptying, vaginal atrophy with  history of breast cancer, BMI, nocturia and post visit documentation and ordering medication/ testing.   Darlene Ehlers, MD

## 2024-01-20 NOTE — Assessment & Plan Note (Signed)
-   POCT UA negative - We discussed the symptoms of overactive bladder (OAB), which include urinary urgency, urinary frequency, nocturia, with or without urge incontinence.  While we do not know the exact etiology of OAB, several treatment options exist. We discussed management including behavioral therapy (decreasing bladder irritants, urge suppression strategies, timed voids, bladder retraining), physical therapy, medication; for refractory cases posterior tibial nerve stimulation, sacral neuromodulation, and intravesical botulinum toxin injection.  For anticholinergic medications, we discussed the potential side effects of anticholinergics including dry eyes, dry mouth, constipation, cognitive impairment and urinary retention. For Beta-3 agonist medication, we discussed the potential side effect of elevated blood pressure which is more likely to occur in individuals with uncontrolled hypertension. - continue gemtesa due to reduced dry eyes compared to mirabegron  - failed vesicare - For refractory OAB we reviewed the procedure for intravesical Botox injection with cystoscopy in the office and reviewed the risks, benefits and alternatives of treatment including but not limited to infection, need for self-catheterization and need for repeat therapy.  We discussed that there is a 5-15% chance of needing to catheterize with Botox and that this usually resolves in a few months; however can persist for longer periods of time.  Typically Botox injections would need to be repeated every 3-12 months since this is not a permanent therapy.   We discussed the role of sacral neuromodulation and how it works. It requires a test phase, and documentation of bladder function via diary. After a successful test period, a permanent wire and generator are placed in the OR. The battery lasts 5 years on average and would need to be replaced surgically.  The goal of this therapy is at least a 50% improvement in symptoms. It is NOT  realistic to expect a 100% cure.  We reviewed the fact that about 30% of patients fail the test phase and are not candidates for permanent generator placement.  We discussed the risk of infection and that the patient would not be able to get an MRI once the device is placed. There are two companies that provide this therapy: [employer] and [employer]. [employer]' product is new and is similar to [employer]'s, but has advantages of a smaller and rechargeable battery and being able to have an MRI with the implant. For all procedures, we discussed risks of bleeding, infection, damage to surrounding organs including bowel, bladder, blood vessels, ureters and nerves, need for further surgery, risk of postoperative urinary incontinence or retention with need to catheterize, recurrent prolapse, numbness and weakness at any body site, buttock pain, and the rarer risks of blood clot, heart attack, pneumonia, death.    We also discussed the role of percutaneous tibial nerve stimulation and how it works.  She understands it requires 12 weekly visits for temporary neuromodulation of the sacral nerve roots via the tibial nerve and that she may then require continued tapered treatment.   - pending ROI from [facility] and repeat PVR - encouraged weight reduction and discussed need for bladder diary prior to 3rd line therapy to r/o nocturnal polyuria

## 2024-01-20 NOTE — Assessment & Plan Note (Addendum)
-   avoid fluid intake 3 hours before bedtime - elevated feet during the day or use compression socks to reduce lower extremity swelling - Lisinopril -HCTZ dosing to 2pm - continue Gemtesa at bedtime - discussed sleep study to r/o sleep apnea if refractory symptoms - need for bladder diary prior to 3rd line therapy to r/o nocturnal polyuria

## 2024-01-20 NOTE — Assessment & Plan Note (Signed)
-   pain with bladder fullness and sensation of incomplete emptying with palpation of levator ani - The origin of pelvic floor muscle spasm can be multifactorial, including primary, reactive to a different pain source, trauma, or even part of a centralized pain syndrome.Treatment options include pelvic floor physical therapy, local (vaginal) or oral  muscle relaxants, pelvic muscle trigger point injections or centrally acting pain medications.   - referral for pelvic floor PT - encouraged pelvic floor relaxation during voids

## 2024-02-07 DIAGNOSIS — F4011 Social phobia, generalized: Secondary | ICD-10-CM | POA: Diagnosis not present

## 2024-02-07 DIAGNOSIS — F331 Major depressive disorder, recurrent, moderate: Secondary | ICD-10-CM | POA: Diagnosis not present

## 2024-02-09 ENCOUNTER — Encounter (INDEPENDENT_AMBULATORY_CARE_PROVIDER_SITE_OTHER): Payer: Self-pay | Admitting: Family Medicine

## 2024-02-09 ENCOUNTER — Ambulatory Visit (INDEPENDENT_AMBULATORY_CARE_PROVIDER_SITE_OTHER): Admitting: Family Medicine

## 2024-02-09 VITALS — BP 114/71 | HR 63 | Temp 98.2°F | Ht 63.0 in | Wt 233.0 lb

## 2024-02-09 DIAGNOSIS — N3281 Overactive bladder: Secondary | ICD-10-CM | POA: Diagnosis not present

## 2024-02-09 DIAGNOSIS — E669 Obesity, unspecified: Secondary | ICD-10-CM

## 2024-02-09 DIAGNOSIS — R339 Retention of urine, unspecified: Secondary | ICD-10-CM | POA: Diagnosis not present

## 2024-02-09 DIAGNOSIS — E88819 Insulin resistance, unspecified: Secondary | ICD-10-CM

## 2024-02-09 DIAGNOSIS — Z6841 Body Mass Index (BMI) 40.0 and over, adult: Secondary | ICD-10-CM | POA: Diagnosis not present

## 2024-02-09 DIAGNOSIS — M1990 Unspecified osteoarthritis, unspecified site: Secondary | ICD-10-CM

## 2024-02-09 DIAGNOSIS — M79673 Pain in unspecified foot: Secondary | ICD-10-CM | POA: Diagnosis not present

## 2024-02-09 DIAGNOSIS — I10 Essential (primary) hypertension: Secondary | ICD-10-CM

## 2024-02-09 DIAGNOSIS — E66813 Obesity, class 3: Secondary | ICD-10-CM

## 2024-02-09 MED ORDER — METFORMIN HCL 500 MG PO TABS
500.0000 mg | ORAL_TABLET | Freq: Two times a day (BID) | ORAL | 0 refills | Status: DC
Start: 2024-02-09 — End: 2024-04-05

## 2024-02-09 NOTE — Progress Notes (Signed)
 Office: [phone]  /  Fax: [phone]  WEIGHT SUMMARY AND BIOMETRICS  Anthropometric Measurements Height: 5\' 3"  (1.6 m) Weight: 233 lb (105.7 kg) BMI (Calculated): 41.28 Weight at Last Visit: 236 lb Weight Lost Since Last Visit: 3 lb Weight Gained Since Last Visit: 0 Starting Weight: 241 lb Total Weight Loss (lbs): 8 lb (3.629 kg) Peak Weight: 260 lb   Body Composition  Body Fat %: 54.7 % Fat Mass (lbs): 127.8 lbs Muscle Mass (lbs): 100.6 lbs Total Body Water (lbs): 82.2 lbs Visceral Fat Rating : 20   Other Clinical Data RMR: [id number] Fasting: no Labs: no Today's Visit #: 6 Starting Date: 11/18/23    Chief Complaint: OBESITY    History of Present Illness [name] is a 78 year old female with obesity and hypertension who presents for obesity treatment and progress assessment.  She is adhering to a low-carb diet plan instead of the initially prescribed 1200 calorie and 85 grams of protein journaling plan. She is not exercising and has lost three pounds since her last visit. She is on metformin  for insulin  resistance and reports no gastrointestinal issues. She mentions that she is 'never really hungry', sometimes struggling to meet her 1200 calorie goal.  Her blood pressure is well-controlled at 114/71 with lisinopril  hydrochlorothiazide  10/12.5 mg. She is also managing her hypertension with diet and weight loss.  She experiences significant joint and foot pain, which she attributes to arthritis. The pain was worse yesterday but has improved today. She is taking Tylenol  and tramadol  for pain management and has an upcoming appointment with a rehabilitation specialist to explore further treatment options.  She has a history of foot problems, including a recommendation for reconstructive surgery on both feet due to severe deformities and pain. She uses shoes with removable insoles and carbon fiber inserts, which have helped with balance but not with pain. She is  seeing another doctor to explore potential surgical options for her feet.  She has a history of a heart rhythm issue, described as a 'bloopy thing', tracked back to 2013 during chemotherapy treatment. She has not seen a cardiologist for this issue and was told by her PCP that there is nothing wrong with her heart.  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 She also reports a skin discoloration on her feet since a fall, which has not been evaluated by dermatology.      PHYSICAL EXAM:  Blood pressure 114/71, pulse 63, temperature 98.2 F (36.8 C), height 5\' 3"  (1.6 m), weight 233 lb (105.7 kg), SpO2 96%. Body mass index is 41.27 kg/m.  DIAGNOSTIC DATA REVIEWED:  BMET    Component Value Date/Time   NA 138 11/18/2023 1101   NA 139 06/09/2013 1227   K 4.1 11/18/2023 1101   K 3.7 06/09/2013 1227   CL 100 11/18/2023 1101   CL 99 09/09/2012 1245   CO2 23 11/18/2023 1101   CO2 27 06/09/2013 1227   GLUCOSE 92 11/18/2023 1101   GLUCOSE 112 (H) 03/12/2023 0414   GLUCOSE 98 06/09/2013 1227   GLUCOSE 96 09/09/2012 1245   BUN 23 11/18/2023 1101   BUN 17.4 06/09/2013 1227   CREATININE 0.71 11/18/2023 1101   CREATININE 0.8 06/09/2013 1227   CALCIUM  10.2 11/18/2023 1101   CALCIUM  9.8 06/09/2013 1227   GFRNONAA >60 03/12/2023 0414   GFRAA 80 (L) 01/30/2014 0521   Lab Results  Component Value Date   HGBA1C 5.6 11/18/2023   Lab Results  Component Value Date   INSULIN  15.5 11/18/2023   Lab Results  Component Value Date   TSH 2.800 11/18/2023   CBC    Component Value Date/Time   WBC 7.0 11/18/2023 1101   WBC 9.8 03/12/2023 0414   RBC 4.64 11/18/2023 1101   RBC 3.62 (L) 03/12/2023 0414   HGB 14.1 11/18/2023 1101   HGB 13.0 06/09/2013 1227   HCT 43.8 11/18/2023 1101   HCT 39.2 06/09/2013 1227   PLT 244 11/18/2023 1101   MCV 94 11/18/2023 1101   MCV  88.7 06/09/2013 1227   MCH 30.4 11/18/2023 1101   MCH 30.4 03/12/2023 0414   MCHC 32.2 11/18/2023 1101   MCHC 33.0 03/12/2023 0414   RDW 12.0 11/18/2023 1101   RDW 13.1 06/09/2013 1227   Iron Studies No results found for: "IRON", "TIBC", "FERRITIN", "IRONPCTSAT" Lipid Panel     Component Value Date/Time   CHOL 191 11/18/2023 1101   TRIG 121 11/18/2023 1101   HDL 70 11/18/2023 1101   LDLCALC 100 (H) 11/18/2023 1101   Hepatic Function Panel     Component Value Date/Time   PROT 6.7 11/18/2023 1101   PROT 6.2 (L) 06/09/2013 1227   ALBUMIN 4.3 11/18/2023 1101   ALBUMIN 3.2 (L) 06/09/2013 1227   AST 24 11/18/2023 1101   AST 16 06/09/2013 1227   ALT 20 11/18/2023 1101   ALT 13 06/09/2013 1227   ALKPHOS 66 11/18/2023 1101   ALKPHOS 47 06/09/2013 1227   BILITOT 0.4 11/18/2023 1101   BILITOT 0.39 06/09/2013 1227      Component Value Date/Time   TSH 2.800 11/18/2023 1101   Nutritional Lab Results  Component Value Date   VD25OH 50.6 11/18/2023     Assessment and Plan Assessment & Plan Obesity Obesity management with a focus on dietary changes. She has chosen a low-carb diet instead of the prescribed 1200 calorie and 85g protein plan. Weight loss of 3 pounds since last visit. No exercise currently. Concerns about cost and gastrointestinal side effects of weight loss medications. Decision to focus on dietary management and increase metformin  dosage for insulin  resistance. - Continue low-carb diet - Increase metformin  to twice daily - Encourage upper body exercises, such as arm cycling - Monitor weight and dietary intake  Insulin  resistance Insulin  resistance with current management on a low dose of metformin . No gastrointestinal issues reported. Discussion on increasing metformin  dosage to improve efficacy. She reports lack of hunger and difficulty reaching 1200 calorie intake, which may affect metabolism. Emphasis on the importance of protein intake over calorie count. -  Increase metformin  to twice daily - Monitor dietary intake and ensure adequate protein consumption  Hypertension Hypertension is well-controlled with lisinopril  hydrochlorothiazide  10/12.5 mg. Blood pressure today is 114/71 mmHg. Management includes medication, diet, exercise, and weight loss.  Arthritis Chronic arthritis with recent exacerbation of joint pain, possibly related to weather changes. Current management includes Tylenol  and tramadol  as needed. Referral to physical medicine and rehabilitation for further management. She is cautious with medication use to avoid overuse and liver strain. - Continue Tylenol  and tramadol  as needed - Attend physical medicine and rehabilitation appointment  Foot pain and deformity Chronic foot pain and deformity with a history of recommendations for reconstructive surgery. Concerns about post-operative recovery and weight-bearing limitations. Current management includes carbon fiber inserts in shoes. Upcoming appointment with a third foot specialist for further evaluation. Previous consultations have highlighted the complexity  of her condition, with some specialists advising against surgery due to potential complications. - Continue use of carbon fiber inserts - Attend appointment with foot specialist  Overactive bladder with incomplete emptying Overactive bladder with incomplete emptying. Recent consultation with a urogynecologist who recommended pelvic floor rehabilitation. Discussion on potential use of bladder pacemaker or nerve stimulation in the future. - Proceed with pelvic floor rehabilitation  Breast cancer Breast cancer with previous chemotherapy. Discussion on potential cardiac effects of chemotherapy and recommendation to consult with a cardiologist specializing in women's cardiology for further evaluation. Consideration of past chemotherapy's impact on current health issues, including potential neuropathy and heart rhythm irregularities. -  Refer to Dr. Valorie Gearing for cardiology evaluation and monitoring     I have personally spent 45 minutes total time today in preparation, patient care, and documentation for this visit, including the following: review of clinical lab tests; review of medical history, review of nutrition and customized nutritional counseling.   She was informed of the importance of frequent follow up visits to maximize her success with intensive lifestyle modifications for her multiple health conditions.    Jasmine Mesi, MD

## 2024-02-10 DIAGNOSIS — I10 Essential (primary) hypertension: Secondary | ICD-10-CM | POA: Diagnosis not present

## 2024-02-10 DIAGNOSIS — Z91018 Allergy to other foods: Secondary | ICD-10-CM | POA: Diagnosis not present

## 2024-02-10 DIAGNOSIS — M79672 Pain in left foot: Secondary | ICD-10-CM | POA: Diagnosis not present

## 2024-02-10 DIAGNOSIS — Z1331 Encounter for screening for depression: Secondary | ICD-10-CM | POA: Diagnosis not present

## 2024-02-10 DIAGNOSIS — Z0000 Encounter for general adult medical examination without abnormal findings: Secondary | ICD-10-CM | POA: Diagnosis not present

## 2024-02-10 DIAGNOSIS — Z79899 Other long term (current) drug therapy: Secondary | ICD-10-CM | POA: Diagnosis not present

## 2024-02-10 DIAGNOSIS — R32 Unspecified urinary incontinence: Secondary | ICD-10-CM | POA: Diagnosis not present

## 2024-02-10 DIAGNOSIS — F330 Major depressive disorder, recurrent, mild: Secondary | ICD-10-CM | POA: Diagnosis not present

## 2024-02-10 DIAGNOSIS — M79671 Pain in right foot: Secondary | ICD-10-CM | POA: Diagnosis not present

## 2024-02-16 ENCOUNTER — Encounter: Payer: Self-pay | Admitting: Orthopaedic Surgery

## 2024-02-16 ENCOUNTER — Ambulatory Visit: Admitting: Orthopaedic Surgery

## 2024-02-16 DIAGNOSIS — M79671 Pain in right foot: Secondary | ICD-10-CM

## 2024-02-16 DIAGNOSIS — M79672 Pain in left foot: Secondary | ICD-10-CM | POA: Diagnosis not present

## 2024-02-16 NOTE — Progress Notes (Signed)
 Office Visit Note   Patient: [name]           Date of Birth: 09/13/1946           MRN: [id number] Visit Date: 02/16/2024              Requested by: Tena Feeling, MD [address] [place],  [place] [address] PCP: Tena Feeling, MD   Assessment & Plan: Visit Diagnoses:  1. Bilateral foot pain     Plan: History of Present Illness [name] is a 78 year old female who presents for evaluation of foot deformities and pain management.  She experiences chronic foot pain primarily due to flat, flexible feet, which has worsened with age. A fall a year ago resulted in broken toes on both feet, exacerbating her symptoms. She has arthritis, bone spurs, and bunions, with significant deformity of the big toes pushing over on the other toes, affecting her balance. The pain is severe, often disrupting her sleep.  She has tried carbon fiber inserts as recommended previously by Dr. Julio Ohm, but finds it difficult to find suitable shoes due to swelling and the need for extra wide shoes. Despite using the inserts, her pain persists, and she is seeking alternative opinions for potential surgical intervention.  Her medical history includes bilateral knee replacements, which complicates her ability to use mobility aids that require knee bending.  Physical Exam MUSCULOSKELETAL: Significant flat feet deformity and bunions bilaterally.  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 . Carbon fiber inserts inadequate for deformities and pain relief.  She would like to explore surgical options and have requested referral to foot/ankle surgeons in town.   - Refer to other orthopedic foot and ankle surgeons for further evaluation and management options.  Follow-Up Instructions: No follow-ups on file.   Subjective: Chief Complaint  Patient presents with   Right Foot -  Pain   Left Foot - Pain    HPI  Review of Systems  Constitutional: Negative.   HENT: Negative.    Eyes: Negative.   Respiratory: Negative.    Cardiovascular: Negative.   Endocrine: Negative.   Musculoskeletal: Negative.   Neurological: Negative.   Hematological: Negative.   Psychiatric/Behavioral: Negative.    All other systems reviewed and are negative.    Objective: Vital Signs: There were no vitals taken for this visit.  Physical Exam Vitals and nursing note reviewed.  Constitutional:      Appearance: She is well-developed.  HENT:     Head: Atraumatic.     Nose: Nose normal.  Eyes:     Extraocular Movements: Extraocular movements intact.  Cardiovascular:     Pulses: Normal pulses.  Pulmonary:     Effort: Pulmonary effort is normal.  Abdominal:     Palpations: Abdomen is soft.  Musculoskeletal:     Cervical back: Neck supple.  Skin:    General: Skin is warm.     Capillary Refill: Capillary refill takes less than 2 seconds.  Neurological:     Mental Status: She is alert. Mental status is at baseline.  Psychiatric:        Behavior: Behavior normal.        Thought Content: Thought content normal.        Judgment: Judgment normal.     PMFS History: Patient Active Problem List   Diagnosis Date Noted   Feeling  of incomplete bladder emptying 01/20/2024   Nocturia 01/20/2024   Vaginal atrophy 01/20/2024   Pelvic pain 01/20/2024   OAB (overactive bladder) 12/02/2023   Insulin  resistance 12/02/2023   BMI 40.0-44.9, adult (HCC) 12/02/2023   SOB (shortness of breath) on exertion 11/18/2023   Hyperglycemia 11/18/2023   Allergy to alpha-gal -in remission 11/18/2023   Chest wall pain 11/18/2023   Class 3 severe obesity with serious comorbidity and body mass index (BMI) of 40.0 to 44.9 in adult 11/18/2023   History of adjustable gastric banding- removed 2024 11/18/2023   Bilateral swelling of feet    Syncope and collapse 03/10/2023   Unspecified fracture of  right foot, initial encounter for closed fracture 03/10/2023   Other fracture of left foot, initial encounter for closed fracture 03/10/2023   Fall at home, initial encounter 03/10/2023   Hypokalemia 03/10/2023   Acute prerenal azotemia 03/10/2023   Essential hypertension 03/10/2023   Hyperlipidemia 03/10/2023   Allergic rhinitis 03/10/2023   Mild intermittent asthma 03/10/2023   Urinary incontinence 03/10/2023   Nondisplaced fracture of proximal phalanx of left great toe, initial encounter for closed fracture 03/10/2023   Nondisplaced fracture of proximal phalanx of right great toe, initial encounter for closed fracture 03/10/2023   Osteoarthritis of right knee 01/29/2014    Class: Diagnosis of   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   Acquired absence of breast and nipple 04/13/2012   Symptomatic mammary hypertrophy 04/13/2012   Dehydration 10/21/2011   Breast cancer, stage 1 (HCC) 07/18/2011   Past Medical History:  Diagnosis Date   Acne    Acute UTI 01/23/2014   to start ABX 01/24/2014   Allergy    Anxiety    Arthritis    feet, knee and base of thumbs   Asthma    controlled   Bilateral swelling of feet    Breast cancer (HCC) 2012   Right Breast Cancer   Breast cancer, stage 1 (HCC) 07/18/2011   Carpal tunnel syndrome    right   Dehydration 10/21/2011   Depression    Fibromyalgia    Fibromyalgia    Hepatitis 1976   hepatitis B   History of hiatal hernia    Hyperlipidemia    Hypertension    Insulin  resistance    Mononucleosis 1952   Multiple allergies    Nephritis 1955   Osteopenia    Personal history of chemotherapy 2012   Right Breast Cancer   PONV (postoperative nausea and vomiting)    has had to use scop patch    Family History  Problem Relation Age of Onset   Depression Mother    Stroke Mother    Hypertension Mother    Heart disease Father    Hypertension Father    High Cholesterol Father    Breast cancer Paternal Grandmother    Breast cancer Paternal Aunt     Stroke Other    Hypertension Other    Heart disease Other    Uterine cancer Neg Hx    Rectal cancer Neg Hx    Bladder Cancer Neg Hx     Past Surgical History:  Procedure Laterality Date   ABDOMINAL HYSTERECTOMY  09/15/1979   BREAST CAPSULECTOMY WITH IMPLANT EXCHANGE Right 03/02/2013   Procedure: BREAST CAPSULECTOMY WITH REPOSITIONING THE IMPLANT;  Surgeon: Marilou Showman, DO;  Location: [facility];  Service: Plastics;  Laterality: Right;   BREAST SURGERY     BUNIONECTOMY  09/14/1996   left foot   CATARACT EXTRACTION Left  ETHMOIDECTOMY  09/14/1989   EYE SURGERY     facial plastic surgery  09/14/1994   JOINT REPLACEMENT Left 09/15/2003   KNEE ARTHROPLASTY Right 01/29/2014   Procedure: COMPUTER ASSISTED TOTAL KNEE ARTHROPLASTY;  Surgeon: Adah Acron, MD;  Location: [facility];  Service: Orthopedics;  Laterality: Right;  Right Total Knee Arthroplasty   KNEE ARTHROSCOPY  1996, 2001   left   lap band surgery  09/14/2008   LIPOSUCTION Bilateral 03/02/2013   Procedure: LIPOSUCTION;  Surgeon: Marilou Showman, DO;  Location: [facility];  Service: Plastics;  Laterality: Bilateral;   MASTECTOMY Right 09/14/2010   w/chemo   MASTOPEXY  04/13/2012   Procedure: MASTOPEXY;  Surgeon: Marilou Showman, DO;  Location: [facility];  Service: Plastics;  Laterality: Left;   left breast  mastopexy reduction for symmetry   NASAL SINUS SURGERY  09/15/1999   port-a-cath placement  06/26/2011   tip in lower SVC per chest x-ray read by Dr. Gerrie Krebs   [facility] REMOVAL Left 03/02/2013   Procedure: REMOVAL PORT-A-CATH;  Surgeon: Enid Harry, MD;  Location: [facility];  Service: General;  Laterality: Left;   right breast lumpectomy  09/14/1984   right ear surgery  09/14/1981   tumor removed (?)   right mastectomy  06/26/2011   TONSILLECTOMY  09/14/1948   TOTAL KNEE ARTHROPLASTY  09/14/2004   left   TUBAL LIGATION  09/14/1976   Social  History   Occupational History   Not on file  Tobacco Use   Smoking status: Never   Smokeless tobacco: Never  Vaping Use   Vaping status: Never Used  Substance and Sexual Activity   Alcohol use: No    Alcohol/week: 0.0 standard drinks of alcohol   Drug use: No   Sexual activity: Not Currently    Partners: Male    Birth control/protection: Surgical

## 2024-02-18 DIAGNOSIS — M19072 Primary osteoarthritis, left ankle and foot: Secondary | ICD-10-CM | POA: Diagnosis not present

## 2024-02-18 DIAGNOSIS — M19071 Primary osteoarthritis, right ankle and foot: Secondary | ICD-10-CM | POA: Diagnosis not present

## 2024-02-21 DIAGNOSIS — F331 Major depressive disorder, recurrent, moderate: Secondary | ICD-10-CM | POA: Diagnosis not present

## 2024-02-21 DIAGNOSIS — F4011 Social phobia, generalized: Secondary | ICD-10-CM | POA: Diagnosis not present

## 2024-03-06 DIAGNOSIS — F4011 Social phobia, generalized: Secondary | ICD-10-CM | POA: Diagnosis not present

## 2024-03-06 DIAGNOSIS — F331 Major depressive disorder, recurrent, moderate: Secondary | ICD-10-CM | POA: Diagnosis not present

## 2024-03-08 ENCOUNTER — Ambulatory Visit (INDEPENDENT_AMBULATORY_CARE_PROVIDER_SITE_OTHER): Admitting: Family Medicine

## 2024-03-27 DIAGNOSIS — F331 Major depressive disorder, recurrent, moderate: Secondary | ICD-10-CM | POA: Diagnosis not present

## 2024-03-27 DIAGNOSIS — F4011 Social phobia, generalized: Secondary | ICD-10-CM | POA: Diagnosis not present

## 2024-03-28 ENCOUNTER — Other Ambulatory Visit: Payer: Medicare PPO

## 2024-03-29 ENCOUNTER — Ambulatory Visit (HOSPITAL_BASED_OUTPATIENT_CLINIC_OR_DEPARTMENT_OTHER)
Admission: RE | Admit: 2024-03-29 | Discharge: 2024-03-29 | Disposition: A | Discharge status: Home / Self Care | Source: Ambulatory Visit | Attending: Internal Medicine | Admitting: Internal Medicine

## 2024-03-29 DIAGNOSIS — M8589 Other specified disorders of bone density and structure, multiple sites: Secondary | ICD-10-CM | POA: Insufficient documentation

## 2024-03-29 DIAGNOSIS — Z780 Asymptomatic menopausal state: Secondary | ICD-10-CM | POA: Diagnosis not present

## 2024-03-29 DIAGNOSIS — M85852 Other specified disorders of bone density and structure, left thigh: Secondary | ICD-10-CM | POA: Diagnosis not present

## 2024-03-31 ENCOUNTER — Encounter: Payer: Self-pay | Admitting: Physical Medicine & Rehabilitation

## 2024-03-31 ENCOUNTER — Encounter: Attending: Physical Medicine & Rehabilitation | Admitting: Physical Medicine & Rehabilitation

## 2024-03-31 VITALS — BP 122/75 | HR 60 | Ht 63.0 in | Wt 237.0 lb

## 2024-03-31 DIAGNOSIS — M150 Primary generalized (osteo)arthritis: Secondary | ICD-10-CM | POA: Insufficient documentation

## 2024-03-31 DIAGNOSIS — M545 Low back pain, unspecified: Secondary | ICD-10-CM | POA: Diagnosis not present

## 2024-03-31 DIAGNOSIS — M542 Cervicalgia: Secondary | ICD-10-CM | POA: Insufficient documentation

## 2024-03-31 DIAGNOSIS — M79672 Pain in left foot: Secondary | ICD-10-CM | POA: Diagnosis not present

## 2024-03-31 DIAGNOSIS — M79671 Pain in right foot: Secondary | ICD-10-CM | POA: Insufficient documentation

## 2024-03-31 DIAGNOSIS — M797 Fibromyalgia: Secondary | ICD-10-CM | POA: Diagnosis not present

## 2024-03-31 DIAGNOSIS — F32A Depression, unspecified: Secondary | ICD-10-CM | POA: Diagnosis not present

## 2024-03-31 DIAGNOSIS — G8929 Other chronic pain: Secondary | ICD-10-CM | POA: Diagnosis not present

## 2024-03-31 DIAGNOSIS — G479 Sleep disorder, unspecified: Secondary | ICD-10-CM | POA: Diagnosis not present

## 2024-03-31 MED ORDER — SUZETRIGINE 50 MG PO TABS
50.0000 mg | ORAL_TABLET | Freq: Two times a day (BID) | ORAL | 0 refills | Status: AC | PRN
Start: 2024-03-31 — End: ?

## 2024-03-31 MED ORDER — SAVELLA TITRATION PACK 12.5 & 25 & 50 MG PO MISC
1.0000 | ORAL | 0 refills | Status: DC
Start: 2024-03-31 — End: 2024-04-12
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
## 2024-04-05 ENCOUNTER — Encounter (INDEPENDENT_AMBULATORY_CARE_PROVIDER_SITE_OTHER): Payer: Self-pay | Admitting: Family Medicine

## 2024-04-05 ENCOUNTER — Ambulatory Visit (INDEPENDENT_AMBULATORY_CARE_PROVIDER_SITE_OTHER): Admitting: Family Medicine

## 2024-04-05 VITALS — BP 115/69 | HR 60 | Temp 97.9°F | Ht 63.0 in | Wt 232.0 lb

## 2024-04-05 DIAGNOSIS — M797 Fibromyalgia: Secondary | ICD-10-CM

## 2024-04-05 DIAGNOSIS — E88819 Insulin resistance, unspecified: Secondary | ICD-10-CM | POA: Diagnosis not present

## 2024-04-05 DIAGNOSIS — F32A Depression, unspecified: Secondary | ICD-10-CM | POA: Diagnosis not present

## 2024-04-05 DIAGNOSIS — Z6841 Body Mass Index (BMI) 40.0 and over, adult: Secondary | ICD-10-CM | POA: Diagnosis not present

## 2024-04-05 DIAGNOSIS — F339 Major depressive disorder, recurrent, unspecified: Secondary | ICD-10-CM

## 2024-04-05 DIAGNOSIS — E669 Obesity, unspecified: Secondary | ICD-10-CM

## 2024-04-05 MED ORDER — METFORMIN HCL 500 MG PO TABS: 500.0000 mg | ORAL_TABLET | Freq: Two times a day (BID) | ORAL | 0 refills | Status: DC

## 2024-04-05 NOTE — Progress Notes (Signed)
 Office: [phone]  /  Fax: [phone]  WEIGHT SUMMARY AND BIOMETRICS  Anthropometric Measurements Height: 5' 3 (1.6 m) Weight: 232 lb (105.2 kg) BMI (Calculated): 41.11 Weight at Last Visit: 233 lb Weight Lost Since Last Visit: 1 lb Weight Gained Since Last Visit: 0 Starting Weight: 241 lb Total Weight Loss (lbs): 9 lb (4.082 kg) Peak Weight: 260 lb   Body Composition  Body Fat %: 54.4 % Fat Mass (lbs): 126.6 lbs Muscle Mass (lbs): 100.6 lbs Total Body Water (lbs): 81 lbs Visceral Fat Rating : 20   Other Clinical Data RMR: [id number] Fasting: yes Labs: no Today's Visit #: 7 Starting Date: 11/18/23    Chief Complaint: OBESITY   Discussed the use of AI scribe software for clinical note transcription with the patient, who gave verbal consent to proceed.  History of Present Illness [name] is a 78 year old female who presents for obesity treatment and progress assessment.  She has been adhering to a low-carb diet approximately ninety percent of the time. Over the past two months, she has lost one pound, totaling a ten-pound weight loss since starting the program. She feels overwhelmed by her health issues and the slow progress in weight loss, attributing it to the lack of a support system and increased pain compared to previous successful weight loss attempts.  She experiences chronic pain, particularly in her feet, and uses tramadol  only when the pain becomes unbearable, avoiding daily use due to concerns about opioid dependency. A new non-opioid pain medication was prescribed, but it is expensive and not covered by insurance. She is considering trying Cymbalta  again, as a new version was prescribed but is not yet available at her pharmacy.  She has severe foot deformities and pain, having consulted with multiple orthopedic surgeons. She wears heavy bandages and extra cushioning to prevent irritation from a bone spur that is trying to push through the skin.  Possible surgical interventions were discussed, including fusing the toes and cleaning up bunions and bone spurs, as described by the orthopedic surgeon. The orthopedic surgeon informed her that surgery may not resolve all issues, particularly the pain in her left foot that radiates from the toes to the ankle and up the leg.  She has a history of depression since childhood and has been on various antidepressants since the 1970s. Currently, she takes Wellbutrin and tramadol  at night for sleep. She is concerned about potential resistance to antidepressants due to long-term use and is awaiting a referral to a psychiatrist to adjust her medication regimen. She is experiencing significant life stressors and loss of support systems, contributing to her current depressive state.  She has fibromyalgia, which has not flared recently, but she experiences sensitivity to pain. She was informed that fibromyalgia does not go away and can cause heightened pain sensitivity.  She is dealing with multiple health issues, including bladder problems for which she is awaiting therapy. She is also scheduled for an evaluation at the [facility] for potential exercises to help with her condition.      PHYSICAL EXAM:  Blood pressure 115/69, pulse 60, temperature 97.9 F (36.6 C), height 5' 3 (1.6 m), weight 232 lb (105.2 kg), SpO2 95%. Body mass index is 41.1 kg/m.  DIAGNOSTIC DATA REVIEWED:  BMET    Component Value Date/Time   NA 138 11/18/2023 1101   NA 139 06/09/2013 1227   K 4.1 11/18/2023 1101   K 3.7 06/09/2013 1227   CL 100 11/18/2023 1101   CL  99 09/09/2012 1245   CO2 23 11/18/2023 1101   CO2 27 06/09/2013 1227   GLUCOSE 92 11/18/2023 1101   GLUCOSE 112 (H) 03/12/2023 0414   GLUCOSE 98 06/09/2013 1227   GLUCOSE 96 09/09/2012 1245   BUN 23 11/18/2023 1101   BUN 17.4 06/09/2013 1227   CREATININE 0.71 11/18/2023 1101   CREATININE 0.8 06/09/2013 1227   CALCIUM  10.2 11/18/2023 1101   CALCIUM  9.8  06/09/2013 1227   GFRNONAA >60 03/12/2023 0414   GFRAA 80 (L) 01/30/2014 0521   Lab Results  Component Value Date   HGBA1C 5.6 11/18/2023   Lab Results  Component Value Date   INSULIN  15.5 11/18/2023   Lab Results  Component Value Date   TSH 2.800 11/18/2023   CBC    Component Value Date/Time   WBC 7.0 11/18/2023 1101   WBC 9.8 03/12/2023 0414   RBC 4.64 11/18/2023 1101   RBC 3.62 (L) 03/12/2023 0414   HGB 14.1 11/18/2023 1101   HGB 13.0 06/09/2013 1227   HCT 43.8 11/18/2023 1101   HCT 39.2 06/09/2013 1227   PLT 244 11/18/2023 1101   MCV 94 11/18/2023 1101   MCV 88.7 06/09/2013 1227   MCH 30.4 11/18/2023 1101   MCH 30.4 03/12/2023 0414   MCHC 32.2 11/18/2023 1101   MCHC 33.0 03/12/2023 0414   RDW 12.0 11/18/2023 1101   RDW 13.1 06/09/2013 1227   Iron Studies No results found for: IRON, TIBC, FERRITIN, IRONPCTSAT Lipid Panel     Component Value Date/Time   CHOL 191 11/18/2023 1101   TRIG 121 11/18/2023 1101   HDL 70 11/18/2023 1101   LDLCALC 100 (H) 11/18/2023 1101   Hepatic Function Panel     Component Value Date/Time   PROT 6.7 11/18/2023 1101   PROT 6.2 (L) 06/09/2013 1227   ALBUMIN 4.3 11/18/2023 1101   ALBUMIN 3.2 (L) 06/09/2013 1227   AST 24 11/18/2023 1101   AST 16 06/09/2013 1227   ALT 20 11/18/2023 1101   ALT 13 06/09/2013 1227   ALKPHOS 66 11/18/2023 1101   ALKPHOS 47 06/09/2013 1227   BILITOT 0.4 11/18/2023 1101   BILITOT 0.39 06/09/2013 1227      Component Value Date/Time   TSH 2.800 11/18/2023 1101   Nutritional Lab Results  Component Value Date   VD25OH 50.6 11/18/2023     Assessment and Plan Assessment & Plan Foot Deformity with Bone Spurs Severe foot deformities with bone spurs are causing significant pain and balance issues. An orthopedic surgeon recommended toe fusion and removal of bunions and bone spurs. Surgery may improve balance but might not alleviate all pain and could lead to other complications. She plans  to wait until fall for surgery due to other ongoing treatments and appointments. - Plan for foot surgery in the fall, including toe fusion and removal of bunions and bone spurs - Schedule surgery approximately one month before desired surgery date  Chronic Pain Management Chronic pain, particularly in her feet, is managed with tramadol  as needed. A new non-opioid pain medication was prescribed but is not covered by insurance. Considering alternatives like Cymbalta , previously used, and awaiting further guidance. Pain management is complicated by fibromyalgia and insurance issues. Engineer, maintenance (IT) pharmacy and insurance to resolve coverage issues for new pain medication - Consider using Cymbalta  if new medication is not feasible  Fibromyalgia Fibromyalgia contributes to chronic pain and sensitivity. No major flare recently but remains sensitive to pain. Discussed the chronic nature of fibromyalgia and its impact  on pain perception. - Follow up with Doctor Laurier for ongoing management -Continue to work on exercise and weight loss when possible, goal to avoid weight gain while she deals with her multiple other health issues  Depression Long history of depression, managed with Wellbutrin and tramadol . Concerned about potential resistance to antidepressants. Referred to a psychiatrist for medication adjustment. Discussed using a combination of medications to address neurotransmitter imbalances. Depression is exacerbated by chronic pain and lack of support system. - Follow up with psychiatrist for medication adjustment - Continue Wellbutrin and tramadol  as prescribed  Obesity and Insulin  resistance Following a low-carb eating plan, she has lost one pound over the last two months, with a total weight loss of ten pounds since starting the program. Feeling discouraged due to slow progress and considering options like Wegovy but concerned about its impact on depression. Emphasized portion control and smart  food choices, suggesting a hiatus to focus on other health issues before revisiting weight loss efforts. Georjean should not be started until depression is well controlled as it can exacerbate mood issues if started prematurely. - Continue low-carb eating plan with focus on portion control and smart food choices - Consider a hiatus from intensive weight loss efforts to focus on other health issues - Reassess weight loss strategy in 1 month - Refill metformin      I have personally spent 44 minutes total time today in preparation, patient care, and documentation for this visit, including the following: review of clinical lab tests; review of medical history, review of multiple health issues, discussion of ability to follow a structured plan at this time and nutritional counseling   She was informed of the importance of frequent follow up visits to maximize her success with intensive lifestyle modifications for her multiple health conditions.    Louann Penton, MD

## 2024-04-10 ENCOUNTER — Encounter: Payer: Self-pay | Admitting: Physical Medicine & Rehabilitation

## 2024-04-10 DIAGNOSIS — F331 Major depressive disorder, recurrent, moderate: Secondary | ICD-10-CM | POA: Diagnosis not present

## 2024-04-10 DIAGNOSIS — F4011 Social phobia, generalized: Secondary | ICD-10-CM | POA: Diagnosis not present

## 2024-04-11 NOTE — Therapy (Unsigned)
 OUTPATIENT PHYSICAL THERAPY FEMALE PELVIC EVALUATION   Patient Name: [name] MRN: [id number] DOB:[birth year], 78 y.o., female Today's Date: 04/12/2024  END OF SESSION:  PT End of Session - 04/12/24 1231     Visit Number 1    Date for PT Re-Evaluation 07/05/24    Authorization Type [employer]    Progress Note Due on Visit 10    PT Start Time 1230    PT Stop Time 1310    PT Time Calculation (min) 40 min    Activity Tolerance Patient tolerated treatment well    Behavior During Therapy [facility] for tasks assessed/performed          Past Medical History:  Diagnosis Date   Acne    Acute UTI 01/23/2014   to start ABX 01/24/2014   Allergy    Anxiety    Arthritis    feet, knee and base of thumbs   Asthma    controlled   Bilateral swelling of feet    Breast cancer (HCC) 2012   Right Breast Cancer   Breast cancer, stage 1 (HCC) 07/18/2011   Carpal tunnel syndrome    right   Dehydration 10/21/2011   Depression    Fibromyalgia    Fibromyalgia    Hepatitis 1976   hepatitis B   History of hiatal hernia    Hyperlipidemia    Hypertension    Insulin  resistance    Mononucleosis 1952   Multiple allergies    Nephritis 1955   Osteopenia    Personal history of chemotherapy 2012   Right Breast Cancer   PONV (postoperative nausea and vomiting)    has had to use scop patch   Past Surgical History:  Procedure Laterality Date   ABDOMINAL HYSTERECTOMY  09/15/1979   BREAST CAPSULECTOMY WITH IMPLANT EXCHANGE Right 03/02/2013   Procedure: BREAST CAPSULECTOMY WITH REPOSITIONING THE IMPLANT;  Surgeon: Estefana Reichert, DO;  Location: [facility];  Service: Plastics;  Laterality: Right;   BREAST SURGERY     BUNIONECTOMY  09/14/1996   left foot   CATARACT EXTRACTION Left    ETHMOIDECTOMY  09/14/1989   EYE SURGERY     facial plastic surgery  09/14/1994   JOINT REPLACEMENT Left 09/15/2003   KNEE ARTHROPLASTY Right 01/29/2014   Procedure: COMPUTER ASSISTED TOTAL KNEE  ARTHROPLASTY;  Surgeon: Oneil JAYSON Herald, MD;  Location: [facility];  Service: Orthopedics;  Laterality: Right;  Right Total Knee Arthroplasty   KNEE ARTHROSCOPY  1996, 2001   left   lap band surgery  09/14/2008   LIPOSUCTION Bilateral 03/02/2013   Procedure: LIPOSUCTION;  Surgeon: Estefana Reichert, DO;  Location: [facility];  Service: Plastics;  Laterality: Bilateral;   MASTECTOMY Right 09/14/2010   w/chemo   MASTOPEXY  04/13/2012   Procedure: MASTOPEXY;  Surgeon: Estefana Reichert, DO;  Location: [facility];  Service: Plastics;  Laterality: Left;   left breast  mastopexy reduction for symmetry   NASAL SINUS SURGERY  09/15/1999   port-a-cath placement  06/26/2011   tip in lower SVC per chest x-ray read by Dr. Deward Dames   [facility] REMOVAL Left 03/02/2013   Procedure: REMOVAL PORT-A-CATH;  Surgeon: Donnice Bury, MD;  Location: [facility];  Service: General;  Laterality: Left;   right breast lumpectomy  09/14/1984   right ear surgery  09/14/1981   tumor removed (?)   right mastectomy  06/26/2011   TONSILLECTOMY  09/14/1948   TOTAL KNEE ARTHROPLASTY  09/14/2004   left  TUBAL LIGATION  09/14/1976   Patient Active Problem List   Diagnosis Date Noted   Feeling of incomplete bladder emptying 01/20/2024   Nocturia 01/20/2024   Vaginal atrophy 01/20/2024   Pelvic pain 01/20/2024   OAB (overactive bladder) 12/02/2023   Insulin  resistance 12/02/2023   BMI 40.0-44.9, adult (HCC) 12/02/2023   SOB (shortness of breath) on exertion 11/18/2023   Hyperglycemia 11/18/2023   Allergy to alpha-gal -in remission 11/18/2023   Chest wall pain 11/18/2023   Obesity, unspecified 11/18/2023   History of adjustable gastric banding- removed 2024 11/18/2023   Bilateral swelling of feet    Syncope and collapse 03/10/2023   Unspecified fracture of right foot, initial encounter for closed fracture 03/10/2023   Other fracture of left foot, initial encounter for closed  fracture 03/10/2023   Fall at home, initial encounter 03/10/2023   Hypokalemia 03/10/2023   Acute prerenal azotemia 03/10/2023   Essential hypertension 03/10/2023   Hyperlipidemia 03/10/2023   Allergic rhinitis 03/10/2023   Mild intermittent asthma 03/10/2023   Urinary incontinence 03/10/2023   Nondisplaced fracture of proximal phalanx of left great toe, initial encounter for closed fracture 03/10/2023   Nondisplaced fracture of proximal phalanx of right great toe, initial encounter for closed fracture 03/10/2023   Osteoarthritis of right knee 01/29/2014    Class: Diagnosis of   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   Acquired absence of breast and nipple 04/13/2012   Symptomatic mammary hypertrophy 04/13/2012   Dehydration 10/21/2011   Breast cancer, stage 1 (HCC) 07/18/2011    PCP: Dwight Trula SQUIBB, MD  REFERRING PROVIDER: Guadlupe Lianne DASEN, MD   REFERRING DIAG:  N32.81 (ICD-10-CM) - OAB (overactive bladder)  R39.14 (ICD-10-CM) - Feeling of incomplete bladder emptying  R35.1 (ICD-10-CM) - Nocturia    THERAPY DIAG:  Muscle weakness (generalized)  Other lack of coordination  Rationale for Evaluation and Treatment: Rehabilitation  ONSET DATE: 2022  SUBJECTIVE:                                                                                                                                                                                           SUBJECTIVE STATEMENT: I have to urinate a lot. I do not empty completely.  Fluid intake:  >64oz water per day, 24oz decaf coffee   PAIN:  Are you having pain? No   PRECAUTIONS: Other: breast cancer  RED FLAGS: None   WEIGHT BEARING RESTRICTIONS: No  FALLS:  Has patient fallen in last 6 months? No  OCCUPATION: retired  ACTIVITY LEVEL : low activity  PLOF: Independent  PATIENT GOALS: reduce the amount going to the bathroom and urinary leakage  PERTINENT HISTORY:  s/p lap band removal 01/2023, history of stage I invasive ductal  carcinoma ER/PR positive right breast cancer s/p mastectomy and chemotherapy in 2012, discontinued tamoxifen  in 2014 ; abdominal hysterectomy;    BOWEL MOVEMENT:  Pain with bowel movement: No   URINATION: Pain with urination: No Fully empty bladder: No,When urinating, patient feels a weak stream, difficulty starting urine stream, dribbling after finishing, and the need to urinate multiple times in a row  Stream: sometimes it is weak Urgency: Yes  Frequency: Day time voids 5-6.  Nocturia: 1-3 times per night to void most bothersome, started around 6-8 months ago and increased to overnight time pad use around 1 month ago  Leakage: Urge to void, Walking to the bathroom, and getting up at night from the bed, with a full bladder, sit to stand Pads: Yes: 2 times per day light ones, at night she wears a depends due to when she stands up she will gush urine.   INTERCOURSE: not active   PREGNANCY: Vaginal deliveries 2 Tearing Yes:    PROLAPSE: None   OBJECTIVE:  Note: Objective measures were completed at Evaluation unless otherwise noted.  DIAGNOSTIC FINDINGS:  Post Void Residual 56 mL     PATIENT SURVEYS:  PFIQ-7: 39 UIQ-7: 48  COGNITION: Overall cognitive status: Within functional limits for tasks assessed      GAIT: Assistive device utilized: Single point cane Comments: needs to use a cane due to chronic bilateral feet pain and hypermobility of the feet  POSTURE: rounded shoulders, forward head, and increased thoracic kyphosis   LUMBARAROM/PROM:Decreased by 50%   LOWER EXTREMITY MNF:Apojuzmjo hip ROM is full   LOWER EXTREMITY MMT:  MMT Right eval Left eval  Hip flexion 4/5 4/5  Hip extension 3/5 3/5  Hip abduction 3/5 3/5  Hip adduction 4/5 4/5   (Blank rows = not tested) PALPATION:    Pelvic Alignment: ASIS are equal  Abdominal: tenderness located in the left lower abdomen, not able to tighten the lower abdomen and will lift the lower rib cage                 External Perineal Exam: Intact                             Internal Pelvic Floor: tenderness located along the left side of the bladder, tightness in the sides of the introitus  Patient confirms identification and approves PT to assess internal pelvic floor and treatment Yes  PELVIC MMT:   MMT eval  Vaginal 0/5  (Blank rows = not tested)        TONE: Average tone  PROLAPSE: none  TODAY'S TREATMENT:                                                                                                                              DATE: 04/12/24  EVAL Examination completed, findings reviewed, pt educated on  POC, HEP, and female pelvic floor anatomy, reasoning with pelvic floor assessment internally with pt consent, and . Pt motivated to participate in PT and agreeable to attempt recommendations.     PATIENT EDUCATION:  04/12/24 Education details: Access Code: [email or web address] Person educated: Patient Education method: Explanation, Demonstration, Actor cues, Verbal cues, and Handouts Education comprehension: verbalized understanding, returned demonstration, verbal cues required, tactile cues required, and needs further education  HOME EXERCISE PROGRAM: 04/12/24 Access Code: [email or web address] URL: [email or web address] Date: 04/12/2024 Prepared by: Channing Pereyra  Exercises - Seated Hip Adduction Isometrics with Mercer  - 2 x daily - 7 x weekly - 1 sets - 10 reps - 2 sec hold - Seated Diaphragmatic Breathing  - 2 x daily - 7 x weekly - 1 sets - 10 reps  ASSESSMENT:  CLINICAL IMPRESSION: Patient is a 79 y.o. female who was seen today for physical therapy evaluation and treatment for overactive bladder, incomplete bladder emptying and nocturia.  Patient has had urinary leakage issues since 2022. She will leak urine with urge to void, walking to the bathroom, and getting up at night from the bed, with a full bladder, sit to stand. Patient soaks her depends at night when she gets up to  urinate. She has to urinate 1-3 times per night. She will go every 30 -45 minutes during the day especially when she is in the community. Pelvic floor strength is 0/5 and is not sure how to contract. She has weakness in her hips and core. Patient has to use a cane for balance due to her feet in chronic pain. Patient will benefit from skilled therapy to improve pelvic floor coordination and strength to reduce urgency and leakage.   OBJECTIVE IMPAIRMENTS: decreased coordination, decreased endurance, decreased ROM, decreased strength, and increased fascial restrictions.   ACTIVITY LIMITATIONS: sitting, standing, sleeping, transfers, continence, and locomotion level  PARTICIPATION LIMITATIONS: meal prep, cleaning, laundry, driving, shopping, and community activity  PERSONAL FACTORS: Age, Fitness, and 3+ comorbidities: s/p lap band removal 01/2023, history of stage I invasive ductal carcinoma ER/PR positive right breast cancer s/p mastectomy and chemotherapy in 2012, discontinued tamoxifen  in 2014 ; abdominal hysterectomy are also affecting patient's functional outcome.   REHAB POTENTIAL: Good  CLINICAL DECISION MAKING: Evolving/moderate complexity  EVALUATION COMPLEXITY: Moderate   GOALS: Goals reviewed with patient? Yes  SHORT TERM GOALS: Target date: 05/10/24  Patient is able to contract her pelvic floor and feel it to progress her strength for continence.  Baseline: Goal status: INITIAL  2.  Patient is able to contract her lower abdominals to assist with continence.  Baseline:  Goal status: INITIAL  3.  Patient is instructed on urge suppression to deter the urge to void to reduce urinary frequency.  Baseline:  Goal status: INITIAL  4.  Patient is able to perform diaphragmatic breathing to relax the pelvic floor for urge suppression.  Baseline:  Goal status: INITIAL    LONG TERM GOALS: Target date: 07/05/24  Patient is independent with advanced HEP for core and pelvic floor to  reduce her urinary incontinence.  Baseline:  Goal status: INITIAL  2.  Patient is able to wake up at night and have minimal urinary leakage and only have wear a light pad instead of depends.  Baseline:  Goal status: INITIAL  3.  Patient is able to wait for 2 hours to urinate due to using the urge suppression and pelvic floor strength >/= 3/5.  Baseline:  Goal status: INITIAL  4.  Patient is able to  perform diaphragmatic breathing to fully empty her bladder.  Baseline:  Goal status: INITIAL  5.  UIQ-7 score is </= 20 compared to 48 due to reduction of urinary leakage, reduction of frustration and emotional distress.  Baseline:  Goal status: INITIAL   PLAN:  PT FREQUENCY: 1-2x/week  PT DURATION: 12 weeks  PLANNED INTERVENTIONS: 97110-Therapeutic exercises, 97530- Therapeutic activity, 97112- Neuromuscular re-education, 97535- Self Care, [id number]- Manual therapy, 20560 (1-2 muscles), 20561 (3+ muscles)- Dry Needling, Patient/Family education, Joint mobilization, Spinal mobilization, Cryotherapy, Moist heat, and Biofeedback  PLAN FOR NEXT SESSION: use electrical simulation to contract the pelvic floor, manual work around the bladder and introitus, diaphragmatic breathing, lower abdominal contraction   Channing Pereyra, PT 04/12/24 1:21 PM

## 2024-04-12 ENCOUNTER — Telehealth: Payer: Self-pay

## 2024-04-12 ENCOUNTER — Ambulatory Visit: Attending: Obstetrics | Admitting: Physical Therapy

## 2024-04-12 ENCOUNTER — Encounter: Payer: Self-pay | Admitting: Physical Therapy

## 2024-04-12 ENCOUNTER — Other Ambulatory Visit: Payer: Self-pay

## 2024-04-12 DIAGNOSIS — R278 Other lack of coordination: Secondary | ICD-10-CM | POA: Diagnosis not present

## 2024-04-12 DIAGNOSIS — M6281 Muscle weakness (generalized): Secondary | ICD-10-CM | POA: Insufficient documentation

## 2024-04-12 MED ORDER — DULOXETINE HCL 30 MG PO CPEP: 30.0000 mg | ORAL_CAPSULE | Freq: Every day | ORAL | 2 refills | Status: DC

## 2024-04-12 NOTE — Addendum Note (Signed)
 Addended by: URBANO ALBRIGHT on: 04/12/2024 11:08 PM   Modules accepted: Orders

## 2024-04-12 NOTE — Telephone Encounter (Signed)
 Patient calling into the office stating that due to insurance issues she would like to have an rx for Cymbalta .  Patient also states that [facility]  rx.  Patient states it isn't worth the hassle and she will continue with the Tramadol  which is prescribed by her GP.  Can you please send an rx to pharmacy for Cymbalta  for this patient.

## 2024-04-21 ENCOUNTER — Ambulatory Visit: Admitting: Obstetrics

## 2024-04-24 ENCOUNTER — Ambulatory Visit: Admitting: Physical Therapy

## 2024-04-24 DIAGNOSIS — F331 Major depressive disorder, recurrent, moderate: Secondary | ICD-10-CM | POA: Diagnosis not present

## 2024-04-24 DIAGNOSIS — F4011 Social phobia, generalized: Secondary | ICD-10-CM | POA: Diagnosis not present

## 2024-05-09 ENCOUNTER — Other Ambulatory Visit: Payer: Self-pay

## 2024-05-09 ENCOUNTER — Encounter (HOSPITAL_COMMUNITY): Payer: Self-pay | Admitting: Psychiatry

## 2024-05-09 ENCOUNTER — Ambulatory Visit (HOSPITAL_BASED_OUTPATIENT_CLINIC_OR_DEPARTMENT_OTHER): Payer: Self-pay | Admitting: Psychiatry

## 2024-05-09 VITALS — BP 139/79 | HR 82 | Ht 64.0 in | Wt 234.0 lb

## 2024-05-09 DIAGNOSIS — F329 Major depressive disorder, single episode, unspecified: Secondary | ICD-10-CM

## 2024-05-09 MED ORDER — DULOXETINE HCL 60 MG PO CPEP: 60.0000 mg | ORAL_CAPSULE | Freq: Every day | ORAL | 4 refills | Status: DC

## 2024-05-09 NOTE — Progress Notes (Signed)
 Psychiatric Initial Adult Assessment   Patient Identification: [name] MRN:  [id number] Date of Evaluation:  05/09/2024 Referral Source:  Chief Complaint: Depressed mood Visit Diagnosis:   History of Present Illness:   This patient is a 78 year old white divorced female mother is being evaluated today for clinical depression.  She has been on multiple psychotropic medications.  She lives alone.  She has been divorced twice.  In the last year or so 2 of her cousins who are very close to her died.  The patient has extensive medical problems.  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  Approximately a year ago her chronic depression got worse when she became dehydrated and fell.  She has had orthopedic injuries to her feet which are superimposed upon chronic problems with her feet.  She has significant pain and discomfort and uses a cane.  She describes it as being flat and full of arthritis.  The patient acknowledges persistent daily depression has been present for years but is much worse in the last 1 year.  She has a reduction about her ability to enjoy things she is doing for she used to enjoy watching TV and painting.  Now she still enjoys her dog and she does not read.  Her sleep is impaired only because her possibly because of pain which actually is somewhat improved lately and because of bladder problems.  The patient's appetite in general was stable.  Her energy level is chronically low.  She has no problems thinking and concentrating.  She is a good sense of worth.  She is not suicidal now and never has been.  She has never made a suicide attempt.  She denies use of alcohol or drugs.  She has never had psychosis.  He denies ever having an episode of mania.  She denies symptoms of generalized anxiety disorder, panic disorder or obsessive-compulsive disorder.  The patient has 2 children.  Her daughter's name is Powell who likely has bipolar disorder.  Her son's name  is Tonna who is had a CVA at a young age. She has had significant health problems over the years.  This includes a brain tumor, nephritis fibromyalgia and her tick disorder causing Alpha Gow disease Her past psychiatric history is positive for seeing Dr. Burnice a psychiatrist for both medications and therapy.  She has never been a psychiatric patient in a hospital.  He has been on Zoloft and Paxil which produced sedation.  She presently is on a low dose of Wellbutrin 150 mg.  Higher doses apparently caused problems.  She has never been on Fetzima or Abilify or Remeron.  Associated Signs/Symptoms: Depression Symptoms:  depressed mood, (Hypo) Manic Symptoms:   Anxiety Symptoms:   Psychotic Symptoms:   PTSD Symptoms: NA  Past Psychiatric History: Multiple antidepressants and multiple courses of psychotherapy.  She just recently ended therapy from a therapist at [facility].  Previous Psychotropic Medications: Yes  Substance Abuse History in the last 12 months:  No.  Consequences of Substance Abuse: NA  Past Medical History:  Past Medical History:  Diagnosis Date   Acne    Acute UTI 01/23/2014   to start ABX 01/24/2014   Allergy    Anxiety    Arthritis    feet, knee and base of thumbs   Asthma    controlled   Bilateral swelling of feet    Breast cancer (HCC) 2012   Right Breast Cancer   Breast cancer,  stage 1 (HCC) 07/18/2011   Carpal tunnel syndrome    right   Dehydration 10/21/2011   Depression    Fibromyalgia    Fibromyalgia    Hepatitis 1976   hepatitis B   History of hiatal hernia    Hyperlipidemia    Hypertension    Insulin  resistance    Mononucleosis 1952   Multiple allergies    Nephritis 1955   Osteopenia    Personal history of chemotherapy 2012   Right Breast Cancer   PONV (postoperative nausea and vomiting)    has had to use scop patch    Past Surgical History:  Procedure Laterality Date   ABDOMINAL HYSTERECTOMY  09/15/1979    BREAST CAPSULECTOMY WITH IMPLANT EXCHANGE Right 03/02/2013   Procedure: BREAST CAPSULECTOMY WITH REPOSITIONING THE IMPLANT;  Surgeon: Estefana Reichert, DO;  Location: [facility];  Service: Plastics;  Laterality: Right;   BREAST SURGERY     BUNIONECTOMY  09/14/1996   left foot   CATARACT EXTRACTION Left    ETHMOIDECTOMY  09/14/1989   EYE SURGERY     facial plastic surgery  09/14/1994   JOINT REPLACEMENT Left 09/15/2003   KNEE ARTHROPLASTY Right 01/29/2014   Procedure: COMPUTER ASSISTED TOTAL KNEE ARTHROPLASTY;  Surgeon: Oneil JAYSON Herald, MD;  Location: [facility];  Service: Orthopedics;  Laterality: Right;  Right Total Knee Arthroplasty   KNEE ARTHROSCOPY  1996, 2001   left   lap band surgery  09/14/2008   LIPOSUCTION Bilateral 03/02/2013   Procedure: LIPOSUCTION;  Surgeon: Estefana Reichert, DO;  Location: [facility];  Service: Plastics;  Laterality: Bilateral;   MASTECTOMY Right 09/14/2010   w/chemo   MASTOPEXY  04/13/2012   Procedure: MASTOPEXY;  Surgeon: Estefana Reichert, DO;  Location: [facility];  Service: Plastics;  Laterality: Left;   left breast  mastopexy reduction for symmetry   NASAL SINUS SURGERY  09/15/1999   port-a-cath placement  06/26/2011   tip in lower SVC per chest x-ray read by Dr. Deward Dames   [facility] REMOVAL Left 03/02/2013   Procedure: REMOVAL PORT-A-CATH;  Surgeon: Donnice Bury, MD;  Location: [facility];  Service: General;  Laterality: Left;   right breast lumpectomy  09/14/1984   right ear surgery  09/14/1981   tumor removed (?)   right mastectomy  06/26/2011   TONSILLECTOMY  09/14/1948   TOTAL KNEE ARTHROPLASTY  09/14/2004   left   TUBAL LIGATION  09/14/1976    Family Psychiatric History: Bipolar disorder  Family History:  Family History  Problem Relation Age of Onset   Depression Mother    Stroke Mother    Hypertension Mother    Heart disease Father    Hypertension Father    High Cholesterol  Father    Breast cancer Paternal Grandmother    Breast cancer Paternal Aunt    Stroke Other    Hypertension Other    Heart disease Other    Uterine cancer Neg Hx    Rectal cancer Neg Hx    Bladder Cancer Neg Hx     Social History:   Social History   Socioeconomic History   Marital status: Single    Spouse name: Not on file   Number of children: Not on file   Years of education: Not on file   Highest education level: Not on file  Occupational History   Not on file  Tobacco Use   Smoking status: Never   Smokeless tobacco: Never  Vaping Use  Vaping status: Never Used  Substance and Sexual Activity   Alcohol use: No    Alcohol/week: 0.0 standard drinks of alcohol   Drug use: No   Sexual activity: Not Currently    Partners: Male    Birth control/protection: Surgical  Other Topics Concern   Not on file  Social History Narrative   Not on file   Social Drivers of Health   Financial Resource Strain: Not on file  Food Insecurity: Patient Declined (03/10/2023)   Hunger Vital Sign    Worried About Running Out of Food in the Last Year: Patient declined    Ran Out of Food in the Last Year: Patient declined  Transportation Needs: Unknown (03/10/2023)   PRAPARE - Administrator, Civil Service (Medical): Patient declined    Lack of Transportation (Non-Medical): Not on file  Physical Activity: Not on file  Stress: Not on file  Social Connections: Not on file   Additional Social History:   Allergies:   Allergies  Allergen Reactions   Alpha-Gal Anaphylaxis, Hives, Diarrhea and Other (See Comments)    NOTHING that includes anything mammal-related or sourced   Demerol [Meperidine] Nausea And Vomiting   Morphine Nausea And Vomiting and Other (See Comments)    PCA pump   Nsaids     Other Reaction(s): Unknown    Metabolic Disorder Labs: Lab Results  Component Value Date   HGBA1C 5.6 11/18/2023   No results found for: PROLACTIN Lab Results  Component Value  Date   CHOL 191 11/18/2023   TRIG 121 11/18/2023   HDL 70 11/18/2023   LDLCALC 100 (H) 11/18/2023   Lab Results  Component Value Date   TSH 2.800 11/18/2023    Therapeutic Level Labs: No results found for: LITHIUM No results found for: CBMZ No results found for: VALPROATE  Current Medications: Current Outpatient Medications  Medication Sig Dispense Refill   amoxicillin (AMOXIL) 500 MG capsule Take 500 mg by mouth See admin instructions. Take 2,000 mg by mouth one hour prior to dental procedures     Ascorbic Acid (VITAMIN C PO) Take 1 tablet by mouth daily with breakfast.     buPROPion (WELLBUTRIN XL) 150 MG 24 hr tablet Take 150 mg by mouth every morning.     cetirizine  HCl (ZYRTEC ) 5 MG/5ML SOLN Take 10 mLs (10 mg total) by mouth daily.     Cholecalciferol (VITAMIN D -3) 5000 units TABS Take 5,000 Units by mouth daily. Plus K 2     co-enzyme Q-10 30 MG capsule Take 100 mg by mouth 3 (three) times daily.     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     DULoxetine  (CYMBALTA ) 60 MG capsule Take 1 capsule (60 mg total) by mouth daily. 30 capsule 4   EPIPEN  2-PAK 0.3 MG/0.3ML SOAJ injection Inject 0.3 mg into the muscle as needed for anaphylaxis.     estradiol  (ESTRACE ) 0.1 MG/GM vaginal cream Place 0.5g nightly for two weeks then twice a week after 30 g 3   famotidine  (PEPCID ) 20 MG tablet Take 20 mg by mouth 2 (two) times daily as needed (for allergic reactions (re: Alpha-Gal)).     fluticasone  (FLONASE ) 50 MCG/ACT nasal spray Place 1 spray into both nostrils daily.     GEMTESA 75 MG TABS Take 1 tablet by mouth daily.     lisinopril -hydrochlorothiazide  (ZESTORETIC ) 10-12.5 MG tablet Take 1  tablet by mouth daily.     magnesium  gluconate (MAGONATE) 500 MG tablet Take 500 mg by mouth at bedtime.     metFORMIN  (GLUCOPHAGE ) 500 MG tablet Take 1 tablet (500 mg total) by  mouth 2 (two) times daily with a meal. 180 tablet 0   Multiple Vitamin (MULTIVITAMIN) capsule Take 1 capsule by mouth every morning.      Omega-3 Fatty Acids (FISH OIL PO) Take 1,400 mg by mouth daily.     Quercetin 500 MG CAPS Take by mouth.     rosuvastatin  (CRESTOR ) 5 MG tablet Take 5 mg by mouth daily.  6   Suzetrigine  50 MG TABS Take 50 mg by mouth every 12 (twelve) hours as needed. Take two 50mg  capsules for your first dose at the start of a pain episode on an empty stomach, followed by 50mg  (1 capsule)  with or without food every 12 hours until pain episode is improved (Patient not taking: Reported on 05/09/2024) 29 tablet 0   traMADol  (ULTRAM ) 50 MG tablet Take 50 mg by mouth every 6 (six) hours as needed (for pain).     traZODone  (DESYREL ) 50 MG tablet Take 50 mg by mouth at bedtime as needed for sleep.     tretinoin (RETIN-A) 0.1 % cream Apply 1 application topically at bedtime.     TYLENOL  500 MG tablet Take 500-1,000 mg by mouth every 6 (six) hours as needed for mild pain or headache.     Varenicline  Tartrate (TYRVAYA ) 0.03 MG/ACT SOLN Place 1 spray into both nostrils 2 (two) times daily.     No current facility-administered medications for this visit.    Musculoskeletal: Strength & Muscle Tone: abnormal Gait & Station: unsteady Patient leans:   Psychiatric Specialty Exam: Review of Systems  Blood pressure 139/79, pulse 82, height 5' 4 (1.626 m), weight 234 lb (106.1 kg).Body mass index is 40.17 kg/m.  General Appearance: Casual  Eye Contact:  Good  Speech:  Clear and Coherent  Volume:  Normal  Mood:  Depressed  Affect:  Appropriate  Thought Process:  Coherent  Orientation:  Full (Time, Place, and Person)  Thought Content:  WDL  Suicidal Thoughts:  No  Homicidal Thoughts:  No  Memory:  NA  Judgement:  Good  Insight:  Good  Psychomotor Activity:  Normal  Concentration:    Recall:  Good  Fund of Knowledge:Good  Language: Good  Akathisia:  No  Handed:  Right   AIMS (if indicated):  not done  Assets:  Desire for Improvement  ADL's:    Cognition: WNL  Sleep:  Good   Screenings: PHQ2-9    Flowsheet Row Office Visit [facility]09[facility]20[facility] in [facility] Office Visit from 03/31/2024 in [facility] Nutrition from 08/19/2017 in [facility]  - A Dept Of [facility]  PHQ-2 Total Score 6 2 0  PHQ-9 Total Score 13 11 --   Flowsheet Row Office Visit from 05/09/2024 in [facility] ED to Hosp-Admission (Discharged) from 03/10/2023 in [facility] FLO[facility]GRESSIVE CARE [facility] UROLOGY Admission (Discharged) from 02/12/2023 in [facility]  C-SSRS RISK CATEGORY Error: Q3, 4, or 5 should not be populated when Q2 is No No Risk No Risk    Assessment and Plan:   This 78 year old white female has multiple medical conditions and likely has experienced a chronic persistent depression disorder.  She has been on multiple psychotropic medications.  It seems the SSRIs produced lethargy.  She presently is taking Cymbalta  which she says has actually reduced her pain and that potentially could help her sleep.  Today we are going to go ahead and increase Cymbalta  to a more meaningful dose of 60 mg and possibly consider increasing it further if necessary.  She can continue taking this with Wellbutrin.  I do not think the Remeron would be helpful for this patient.  The patient is actually interested in therapy and her next visit we will look into some various therapist for her.  The patient is not suicidal.  She is functioning reasonably well.  Her target goals are to reduce her depression reduce her pain and hopefully reverse her anhedonia.  Collaboration of Care:   Patient/Guardian was advised Release of Information must be obtained prior to any record release in order to collaborate their care with an outside provider.  Patient/Guardian was advised if they have not already done so to contact the registration department to sign all necessary forms in order for us  to release information regarding their care.   Consent: Patient/Guardian gives verbal consent for treatment and assignment of benefits for services provided during this visit. Patient/Guardian expressed understanding and agreed to proceed.   Elna LILLETTE Lo, MD 8/26/20254:02 PM

## 2024-05-10 ENCOUNTER — Ambulatory Visit (INDEPENDENT_AMBULATORY_CARE_PROVIDER_SITE_OTHER): Admitting: Family Medicine

## 2024-05-10 ENCOUNTER — Encounter (INDEPENDENT_AMBULATORY_CARE_PROVIDER_SITE_OTHER): Payer: Self-pay | Admitting: Family Medicine

## 2024-05-10 VITALS — BP 111/71 | HR 67 | Temp 97.5°F | Ht 63.0 in | Wt 229.0 lb

## 2024-05-10 DIAGNOSIS — F329 Major depressive disorder, single episode, unspecified: Secondary | ICD-10-CM | POA: Diagnosis not present

## 2024-05-10 DIAGNOSIS — E88819 Insulin resistance, unspecified: Secondary | ICD-10-CM | POA: Diagnosis not present

## 2024-05-10 DIAGNOSIS — G8929 Other chronic pain: Secondary | ICD-10-CM

## 2024-05-10 DIAGNOSIS — E119 Type 2 diabetes mellitus without complications: Secondary | ICD-10-CM

## 2024-05-10 DIAGNOSIS — Z7984 Long term (current) use of oral hypoglycemic drugs: Secondary | ICD-10-CM

## 2024-05-10 DIAGNOSIS — E669 Obesity, unspecified: Secondary | ICD-10-CM | POA: Diagnosis not present

## 2024-05-10 DIAGNOSIS — E66813 Obesity, class 3: Secondary | ICD-10-CM

## 2024-05-10 DIAGNOSIS — Z6841 Body Mass Index (BMI) 40.0 and over, adult: Secondary | ICD-10-CM

## 2024-05-10 NOTE — Progress Notes (Signed)
 Office: [phone]  /  Fax: [phone]  WEIGHT SUMMARY AND BIOMETRICS  Anthropometric Measurements Height: 5' 3 (1.6 m) Weight: 229 lb (103.9 kg) BMI (Calculated): 40.58 Weight at Last Visit: 232 lb Weight Lost Since Last Visit: 3 lb Weight Gained Since Last Visit: 0 Starting Weight: 241 lb Total Weight Loss (lbs): 12 lb (5.443 kg) Peak Weight: 260 lb   Body Composition  Body Fat %: 53.9 % Fat Mass (lbs): 123.8 lbs Muscle Mass (lbs): 100.6 lbs Total Body Water (lbs): 81.2 lbs Visceral Fat Rating : 19   Other Clinical Data RMR: [id number] Fasting: no Labs: no Today's Visit #: 8 Starting Date: 11/18/23    Chief Complaint: OBESITY   History of Present Illness [name] is a 78 year old female who presents for a follow-up on her obesity treatment plan and progress assessment.  She is following a portion Diplomatic Services operational officer Choices plan for her obesity, adhering to it approximately 80% of the time, but has not been engaging in any exercise. Over the past month, she has lost three pounds.  She has experienced a decreased appetite since starting a combination of Cymbalta , bupropion, and tramadol  about three weeks ago. Initially, she felt woozy, raising concerns about falling. Her appetite has decreased to the point where she only eats a sandwich when hungry, rather than full meals. Despite this, she can now recognize when she is genuinely hungry, which she hasn't experienced in years.  Her Cymbalta  dose was recently doubled. She reports that her pain, particularly in her feet, has improved and her spirits have gone up. She describes chronic pain in her feet due to arthritis, with every step being painful and demoralizing. She also has arthritis in her hands, but the feet are the primary issue.  Her feet are deformed, with bones flat on the ground, causing hammer toes and a bone spur on the top of her foot. She wears pads to prevent rubbing and has difficulty finding shoes  that fit due to bunions and bone spurs. She is currently using carbon fiber inserts and has found some relief with sturdy, flat hiking sandals. She has tried extra wide sneakers but finds them unstable due to padding. She cannot wear her previous favorite brand, [employer], as they do not come in wide enough widths.  She is managing her medication schedule with metformin , which she takes twice a day. She finds it challenging to remember the second dose due to her eating schedule, as she typically does not eat until around 11 AM.      PHYSICAL EXAM:  Blood pressure 111/71, pulse 67, temperature (!) 97.5 F (36.4 C), height 5' 3 (1.6 m), weight 229 lb (103.9 kg), SpO2 95%. Body mass index is 40.57 kg/m.  DIAGNOSTIC DATA REVIEWED:  BMET    Component Value Date/Time   NA 138 11/18/2023 1101   NA 139 06/09/2013 1227   K 4.1 11/18/2023 1101   K 3.7 06/09/2013 1227   CL 100 11/18/2023 1101   CL 99 09/09/2012 1245   CO2 23 11/18/2023 1101   CO2 27 06/09/2013 1227   GLUCOSE 92 11/18/2023 1101   GLUCOSE 112 (H) 03/12/2023 0414   GLUCOSE 98 06/09/2013 1227   GLUCOSE 96 09/09/2012 1245   BUN 23 11/18/2023 1101   BUN 17.4 06/09/2013 1227   CREATININE 0.71 11/18/2023 1101   CREATININE 0.8 06/09/2013 1227   CALCIUM  10.2 11/18/2023 1101   CALCIUM  9.8 06/09/2013 1227   GFRNONAA >60 03/12/2023 0414  GFRAA 80 (L) 01/30/2014 0521   Lab Results  Component Value Date   HGBA1C 5.6 11/18/2023   Lab Results  Component Value Date   INSULIN  15.5 11/18/2023   Lab Results  Component Value Date   TSH 2.800 11/18/2023   CBC    Component Value Date/Time   WBC 7.0 11/18/2023 1101   WBC 9.8 03/12/2023 0414   RBC 4.64 11/18/2023 1101   RBC 3.62 (L) 03/12/2023 0414   HGB 14.1 11/18/2023 1101   HGB 13.0 06/09/2013 1227   HCT 43.8 11/18/2023 1101   HCT 39.2 06/09/2013 1227   PLT 244 11/18/2023 1101   MCV 94 11/18/2023 1101   MCV 88.7 06/09/2013 1227   MCH 30.4 11/18/2023 1101   MCH  30.4 03/12/2023 0414   MCHC 32.2 11/18/2023 1101   MCHC 33.0 03/12/2023 0414   RDW 12.0 11/18/2023 1101   RDW 13.1 06/09/2013 1227   Iron Studies No results found for: IRON, TIBC, FERRITIN, IRONPCTSAT Lipid Panel     Component Value Date/Time   CHOL 191 11/18/2023 1101   TRIG 121 11/18/2023 1101   HDL 70 11/18/2023 1101   LDLCALC 100 (H) 11/18/2023 1101   Hepatic Function Panel     Component Value Date/Time   PROT 6.7 11/18/2023 1101   PROT 6.2 (L) 06/09/2013 1227   ALBUMIN 4.3 11/18/2023 1101   ALBUMIN 3.2 (L) 06/09/2013 1227   AST 24 11/18/2023 1101   AST 16 06/09/2013 1227   ALT 20 11/18/2023 1101   ALT 13 06/09/2013 1227   ALKPHOS 66 11/18/2023 1101   ALKPHOS 47 06/09/2013 1227   BILITOT 0.4 11/18/2023 1101   BILITOT 0.39 06/09/2013 1227      Component Value Date/Time   TSH 2.800 11/18/2023 1101   Nutritional Lab Results  Component Value Date   VD25OH 50.6 11/18/2023     Assessment and Plan Assessment & Plan Obesity Obesity management with a portion control Smart Choices plan. Reports adherence 80% of the time. Weight loss of 3 pounds in the last month. Appetite decreased due to medication regimen including Cymbalta , bupropion, and tramadol . Discussed the importance of maintaining nutritional intake to avoid malnutrition. Anticipated that appetite suppression may not last long-term. - Continue portion control Smart Choices plan - Encourage tracking of food intake to ensure adequate nutrition - Reassess weight and dietary intake in 4 weeks  Chronic foot pain and deformity (including hallux valgus, hammer toes, and bone spurs) Chronic foot pain with deformities including hallux valgus, hammer toes, and bone spurs. Pain significantly impacts mobility and shoe fitting. Discussed potential surgical intervention to correct toe deformities, which may improve shoe fitting and balance. Risks include potential worsening of other issues, but may prevent further  toe misalignment. Considering surgery starting with the worst foot. Discussed footwear options to improve stability and comfort. - Consider surgical consultation for correction of toe deformities - Recommend exploring footwear options such as Altra for better fit and support  Major depressive disorder with chronic pain Major depressive disorder with significant improvement in mood and pain management since starting Cymbalta . Increased dose of Cymbalta  recently, which has further improved pain and mood. Discussed the impact of depression on appetite and weight management. - Continue Cymbalta  with recent dose adjustment - Monitor mood and appetite changes  Type 2 diabetes mellitus Type 2 diabetes mellitus managed with metformin . Discussed challenges with dosing due to irregular meal times. Inquires about extended-release metformin  to improve adherence. Last full set of labs in March, with a  plan to reassess in September. - Adjust metformin  dosing schedule as discussed with patient - Schedule fasting lab tests in September to assess diabetes management - Consider extended-release metformin  if dosing schedule remains challenging     I have personally spent 35 minutes total time today in preparation, patient care, and documentation for this visit, including the following: review of clinical lab tests; review of medical history and nutritional counseling.   She was informed of the importance of frequent follow up visits to maximize her success with intensive lifestyle modifications for her multiple health conditions.    Louann Penton, MD

## 2024-05-14 ENCOUNTER — Other Ambulatory Visit (INDEPENDENT_AMBULATORY_CARE_PROVIDER_SITE_OTHER): Payer: Self-pay | Admitting: Family Medicine

## 2024-05-14 DIAGNOSIS — E88819 Insulin resistance, unspecified: Secondary | ICD-10-CM

## 2024-05-16 ENCOUNTER — Encounter (HOSPITAL_BASED_OUTPATIENT_CLINIC_OR_DEPARTMENT_OTHER): Payer: Self-pay

## 2024-05-16 ENCOUNTER — Ambulatory Visit (HOSPITAL_BASED_OUTPATIENT_CLINIC_OR_DEPARTMENT_OTHER): Admitting: Physical Therapy

## 2024-05-17 ENCOUNTER — Encounter: Payer: Self-pay | Attending: Obstetrics | Admitting: Physical Therapy

## 2024-05-18 DIAGNOSIS — H40013 Open angle with borderline findings, low risk, bilateral: Secondary | ICD-10-CM | POA: Diagnosis not present

## 2024-05-23 DIAGNOSIS — H40013 Open angle with borderline findings, low risk, bilateral: Secondary | ICD-10-CM | POA: Diagnosis not present

## 2024-05-23 DIAGNOSIS — H2511 Age-related nuclear cataract, right eye: Secondary | ICD-10-CM | POA: Diagnosis not present

## 2024-05-23 DIAGNOSIS — H5203 Hypermetropia, bilateral: Secondary | ICD-10-CM | POA: Diagnosis not present

## 2024-05-30 ENCOUNTER — Encounter: Attending: Physical Medicine & Rehabilitation | Admitting: Physical Medicine & Rehabilitation

## 2024-05-31 ENCOUNTER — Ambulatory Visit: Admitting: Physical Therapy

## 2024-06-05 NOTE — Progress Notes (Unsigned)
 SUBJECTIVE: Discussed the use of AI scribe software for clinical note transcription with the patient, who gave verbal consent to proceed.  Chief Complaint: Obesity  Interim History: She is up 3 lbs since her last visit.  Down 9 lbs overall  [name] is here to discuss her progress with her obesity treatment plan. She is on the practicing portion control and making smarter food choices, such as increasing vegetables and decreasing simple carbohydrates and states she is following her eating plan approximately 50 % of the time. She states she is not exercising .  [name] is a 78 year old female who presents for follow-up of her obesity treatment plan.  She was referred to physical medicine and rehab, where she was prescribed additional medication, increased her Cymbalta  and this significantly reduced her appetite, resulting in a three-pound weight loss.  However at this visit, she notes a recent increase in weight despite a decrease in appetite. She is attempting to consume 1200 calories per day but feels bloated and unwell, suspecting her diet may be too high in carbohydrates. She is not following a low-carb diet strictly and is 'nibbling here and there.'  She has a history of insulin  resistance and is currently taking metformin . She questions its effectiveness in weight loss, noting no significant weight reduction since starting the medication. She sometimes forgets the second dose and is considering switching to an extended-release formulation for convenience.  She has a history of fibromyalgia and inflammatory arthritis, which contribute to chronic pain and fatigue. She started Cymbalta , which initially provided significant relief from pain and increased her energy levels, but she experienced dizziness and a 'high' feeling initially. The dizziness has since subsided, but she continues to experience reduced appetite. She has had both knees replaced and reports sensitivity in the knees,  making it difficult to kneel. She experienced a fall about a month ago, which she attributes to dizziness, but did not sustain any serious injuries.  She has a history of hypertension, mixed hyperlipidemia, and vitamin D  deficiency. She is taking omega-3 fish oil supplements and has been doing so for years. She is fasting for lab work today, which will include B12, vitamin D , blood counts, liver and kidney function, A1c, insulin  level, and lipids.  Fasting labs obtained The patient was informed we would discuss the lab results at the next visit unless there is a critical issue that needs to be addressed sooner. The patient agreed to keep the next visit at the agreed upon time to discuss these results.   OBJECTIVE: Visit Diagnoses: Problem List Items Addressed This Visit     Essential hypertension   Hyperlipidemia   Relevant Orders   Lipid Panel With LDL/HDL Ratio   Obesity, unspecified   Relevant Medications   metFORMIN  (GLUCOPHAGE ) 500 MG tablet   Insulin  resistance - Primary   Relevant Medications   metFORMIN  (GLUCOPHAGE ) 500 MG tablet   Other Relevant Orders   CMP14+EGFR   Hemoglobin A1c   Insulin , random   BMI 40.0-44.9, adult (HCC)   Relevant Medications   metFORMIN  (GLUCOPHAGE ) 500 MG tablet   Other Visit Diagnoses       Fibromyalgia         Other fatigue       Relevant Orders   Vitamin B12   CBC with Differential/Platelet     Vitamin D  deficiency       Relevant Orders   VITAMIN D  25 Hydroxy (Vit-D Deficiency, Fractures)     Depression (HCC), with  emotional eating behavior         Obesity with insulin  resistance Reports difficulty with weight loss despite metformin  use. No significant weight loss observed with current metformin  regimen. Reports lack of appetite and difficulty adhering to a low-carb diet. Discussed the potential impact of Cymbalta  on appetite and metabolism. Consideration of extended-release metformin  due to difficulty with twice-daily dosing.  Discussed the modest weight loss associated with metformin  and the potential for once-daily dosing if tolerated. - Order fasting labs including A1c, insulin  level, lipids, and vitamin D  level - Switch to extended-release metformin  if current formulation is not aiding weight loss - Encourage increased protein intake to support metabolism - Discuss potential for once-daily dosing of metformin  if tolerated   Insulin  Resistance Last fasting insulin  was 15.5- not at goal. A1c was 5.6- not at goal. Polyphagia:No Medication(s): Metformin  500 mg twice daily with meals Lab Results  Component Value Date   HGBA1C 5.6 11/18/2023   Lab Results  Component Value Date   INSULIN  15.5 11/18/2023    Plan: Continue and refill Metformin  500 mg twice daily with meals Continue working on nutrition plan to decrease simple carbohydrates, increase lean proteins and exercise to promote weight loss, improve glycemic control and prevent progression to Type 2 diabetes.  Fasting labs obtained today.   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 Currently takes omega-3 supplements.  Fibromyalgia with chronic pain and fatigue Reports significant improvement in pain and energy levels with Cymbalta  initially with Cymbalta , but less so now. Initial dizziness noted with Cymbalta , but resolved. Plan:  Recheck CBC, Vitamin D  and B 12 levels today and supplement adjustments based on lab.    Depression with emotional eating behavior Reports improvement with current antidepressant regimen, including Cymbalta . Psychiatrist has reviewed and adjusted medications. Plan: Continue to work with psychiatry to optimize medications and treatment for depression,  Continue to work on strategies to address emotional eating behaviors   Hyperlipidemia LDL is not at goal. Medication(s): Crestor  5 mg daily, CoQ10,  Cardiovascular  risk factors: advanced age (older than 23 for men, 51 for women), dyslipidemia, hypertension, obesity (BMI >= 30 kg/m2), and sedentary lifestyle  Lab Results  Component Value Date   CHOL 191 11/18/2023   HDL 70 11/18/2023   LDLCALC 100 (H) 11/18/2023   TRIG 121 11/18/2023   Lab Results  Component Value Date   ALT 20 11/18/2023   AST 24 11/18/2023   ALKPHOS 66 11/18/2023   BILITOT 0.4 11/18/2023   The 10-year ASCVD risk score (Arnett DK, et al., 2019) is: 28.7%   Values used to calculate the score:     Age: 59 years     Clincally relevant sex: Female     Is Non-Hispanic African American: No     Diabetic: No     Tobacco smoker: No     Systolic Blood Pressure: 127 mmHg     Is BP treated: Yes     HDL Cholesterol: 70 mg/dL     Total Cholesterol: 191 mg/dL  Plan: Continue statin.- Crestor  and CoQ10 Continue to work on nutrition plan -decreasing simple carbohydrates, increasing lean proteins, decreasing saturated fats and cholesterol , avoiding trans fats and exercise as able to promote weight loss, improve lipids and decrease cardiovascular risks. Recheck fasting lipids today.   Vitamin D  Deficiency Vitamin D  is at goal of 50.  Most recent vitamin D  level was 50.6. Endorses fatigue improved initially after starting  Cymbalta , but less improvement currently.  She is on OTC vitamin D3 5000 IU daily. No N/V or muscle weakness with OTC vitamin D .  Lab Results  Component Value Date   VD25OH 50.6 11/18/2023    Plan: Continue OTC vitamin D3 5000 IU daily Low vitamin D  levels can be associated with adiposity and may result in leptin resistance and weight gain. Also associated with fatigue.  Currently on vitamin D  supplementation without any adverse effects such as nausea, vomiting or muscle weakness.  Recheck vitamin D  level today and adjust supplementation based on lab    Vitals Temp: 98.2 F (36.8 C) BP: 127/75 Pulse Rate: 68 SpO2: 98 %   Anthropometric  Measurements Height: 5' 3 (1.6 m) Weight: 232 lb (105.2 kg) BMI (Calculated): 41.11 Weight at Last Visit: 229lb Weight Lost Since Last Visit: 0lb Weight Gained Since Last Visit: 3lb Starting Weight: 241lb Total Weight Loss (lbs): 9 lb (4.082 kg) Peak Weight: 260lb   Body Composition  Body Fat %: 54.3 % Fat Mass (lbs): 126 lbs Muscle Mass (lbs): 100.6 lbs Total Body Water (lbs): 82.4 lbs Visceral Fat Rating : 20   Other Clinical Data RMR: [id number] Fasting: Yes Labs: Yes Today's Visit #: 9 Starting Date: 11/18/23     ASSESSMENT AND PLAN:  Diet: Nereyda is currently in the action stage of change. As such, her goal is to continue with weight loss efforts. She has agreed to practicing portion control and making smarter food choices, such as increasing vegetables and decreasing simple carbohydrates.  Exercise: Abisola has been instructed to try a geriatric exercise plan and that some exercise is better than none for weight loss and overall health benefits.   Behavior Modification:  We discussed the following Behavioral Modification Strategies today: increasing lean protein intake, decreasing simple carbohydrates, increasing vegetables, increase H2O intake, increase high fiber foods, no skipping meals, meal planning and cooking strategies, avoiding temptations, and planning for success. We discussed various medication options to help [facility] with her weight loss efforts and we both agreed to continue current treatment plan.  Return in about 4 weeks (around 07/05/2024).SABRA She was informed of the importance of frequent follow up visits to maximize her success with intensive lifestyle modifications for her multiple health conditions.  Attestation Statements:   Reviewed by clinician on day of visit: allergies, medications, problem list, medical history, surgical history, family history, social history, and previous encounter notes.   Time spent on visit including pre-visit chart review and  post-visit care and charting was 40 minutes.    Quiera Diffee, PA-C

## 2024-06-07 ENCOUNTER — Ambulatory Visit (INDEPENDENT_AMBULATORY_CARE_PROVIDER_SITE_OTHER): Admitting: Physician Assistant

## 2024-06-07 ENCOUNTER — Encounter (INDEPENDENT_AMBULATORY_CARE_PROVIDER_SITE_OTHER): Payer: Self-pay | Admitting: Physician Assistant

## 2024-06-07 VITALS — BP 127/75 | HR 68 | Temp 98.2°F | Ht 63.0 in | Wt 232.0 lb

## 2024-06-07 DIAGNOSIS — E66813 Obesity, class 3: Secondary | ICD-10-CM | POA: Diagnosis not present

## 2024-06-07 DIAGNOSIS — M1990 Unspecified osteoarthritis, unspecified site: Secondary | ICD-10-CM

## 2024-06-07 DIAGNOSIS — M797 Fibromyalgia: Secondary | ICD-10-CM

## 2024-06-07 DIAGNOSIS — R5383 Other fatigue: Secondary | ICD-10-CM

## 2024-06-07 DIAGNOSIS — E88819 Insulin resistance, unspecified: Secondary | ICD-10-CM | POA: Diagnosis not present

## 2024-06-07 DIAGNOSIS — E559 Vitamin D deficiency, unspecified: Secondary | ICD-10-CM | POA: Diagnosis not present

## 2024-06-07 DIAGNOSIS — E785 Hyperlipidemia, unspecified: Secondary | ICD-10-CM

## 2024-06-07 DIAGNOSIS — I10 Essential (primary) hypertension: Secondary | ICD-10-CM

## 2024-06-07 DIAGNOSIS — F5089 Other specified eating disorder: Secondary | ICD-10-CM

## 2024-06-07 DIAGNOSIS — E782 Mixed hyperlipidemia: Secondary | ICD-10-CM

## 2024-06-07 DIAGNOSIS — F339 Major depressive disorder, recurrent, unspecified: Secondary | ICD-10-CM

## 2024-06-07 DIAGNOSIS — G8929 Other chronic pain: Secondary | ICD-10-CM | POA: Diagnosis not present

## 2024-06-07 DIAGNOSIS — F32A Depression, unspecified: Secondary | ICD-10-CM

## 2024-06-07 DIAGNOSIS — Z6841 Body Mass Index (BMI) 40.0 and over, adult: Secondary | ICD-10-CM

## 2024-06-07 DIAGNOSIS — M0609 Rheumatoid arthritis without rheumatoid factor, multiple sites: Secondary | ICD-10-CM

## 2024-06-07 MED ORDER — METFORMIN HCL 500 MG PO TABS: 500.0000 mg | ORAL_TABLET | Freq: Two times a day (BID) | ORAL | 0 refills | Status: DC

## 2024-06-08 LAB — CBC WITH DIFFERENTIAL/PLATELET
Basophils Absolute: 0.1 x10E3/uL (ref 0.0–0.2)
Basos: 1 %
EOS (ABSOLUTE): 0.1 x10E3/uL (ref 0.0–0.4)
Eos: 1 %
Hematocrit: 43.3 % (ref 34.0–46.6)
Hemoglobin: 14 g/dL (ref 11.1–15.9)
Immature Grans (Abs): 0 x10E3/uL (ref 0.0–0.1)
Immature Granulocytes: 0 %
Lymphocytes Absolute: 2.4 x10E3/uL (ref 0.7–3.1)
Lymphs: 34 %
MCH: 29.9 pg (ref 26.6–33.0)
MCHC: 32.3 g/dL (ref 31.5–35.7)
MCV: 92 fL (ref 79–97)
Monocytes Absolute: 0.6 x10E3/uL (ref 0.1–0.9)
Monocytes: 8 %
Neutrophils Absolute: 3.8 x10E3/uL (ref 1.4–7.0)
Neutrophils: 56 %
Platelets: 280 x10E3/uL (ref 150–450)
RBC: 4.69 x10E6/uL (ref 3.77–5.28)
RDW: 12.4 % (ref 11.7–15.4)
WBC: 6.9 x10E3/uL (ref 3.4–10.8)

## 2024-06-08 LAB — CMP14+EGFR
ALT: 15 IU/L (ref 0–32)
AST: 20 IU/L (ref 0–40)
Albumin: 4.3 g/dL (ref 3.8–4.8)
Alkaline Phosphatase: 72 IU/L (ref 49–135)
BUN/Creatinine Ratio: 26 (ref 12–28)
BUN: 20 mg/dL (ref 8–27)
Bilirubin Total: 0.4 mg/dL (ref 0.0–1.2)
CO2: 22 mmol/L (ref 20–29)
Calcium: 10 mg/dL (ref 8.7–10.3)
Chloride: 99 mmol/L (ref 96–106)
Creatinine, Ser: 0.77 mg/dL (ref 0.57–1.00)
Globulin, Total: 2.2 g/dL (ref 1.5–4.5)
Glucose: 100 mg/dL — ABNORMAL HIGH (ref 70–99)
Potassium: 4.7 mmol/L (ref 3.5–5.2)
Sodium: 138 mmol/L (ref 134–144)
Total Protein: 6.5 g/dL (ref 6.0–8.5)
eGFR: 79 mL/min/1.73 (ref >59)

## 2024-06-08 LAB — LIPID PANEL WITH LDL/HDL RATIO
Cholesterol, Total: 184 mg/dL (ref 100–199)
HDL: 66 mg/dL (ref >39)
LDL Chol Calc (NIH): 103 mg/dL — ABNORMAL HIGH (ref 0–99)
LDL/HDL Ratio: 1.6 ratio (ref 0.0–3.2)
Triglycerides: 82 mg/dL (ref 0–149)
VLDL Cholesterol Cal: 15 mg/dL (ref 5–40)

## 2024-06-08 LAB — VITAMIN B12: Vitamin B-12: 1120 pg/mL (ref 232–1245)

## 2024-06-08 LAB — HEMOGLOBIN A1C
Est. average glucose Bld gHb Est-mCnc: 108 mg/dL
Hgb A1c MFr Bld: 5.4 % (ref 4.8–5.6)

## 2024-06-08 LAB — INSULIN, RANDOM: INSULIN: 14 u[IU]/mL (ref 2.6–24.9)

## 2024-06-08 LAB — VITAMIN D 25 HYDROXY (VIT D DEFICIENCY, FRACTURES): Vit D, 25-Hydroxy: 56.8 ng/mL (ref 30.0–100.0)

## 2024-06-19 ENCOUNTER — Ambulatory Visit: Admitting: Obstetrics

## 2024-06-20 ENCOUNTER — Encounter: Attending: Physical Medicine & Rehabilitation | Admitting: Physical Medicine & Rehabilitation

## 2024-06-20 ENCOUNTER — Encounter: Payer: Self-pay | Admitting: Physical Medicine & Rehabilitation

## 2024-06-20 VITALS — BP 122/78 | HR 65 | Ht 63.0 in | Wt 234.0 lb

## 2024-06-20 DIAGNOSIS — M79671 Pain in right foot: Secondary | ICD-10-CM | POA: Diagnosis not present

## 2024-06-20 DIAGNOSIS — G8929 Other chronic pain: Secondary | ICD-10-CM | POA: Insufficient documentation

## 2024-06-20 DIAGNOSIS — F32A Depression, unspecified: Secondary | ICD-10-CM | POA: Diagnosis not present

## 2024-06-20 DIAGNOSIS — G479 Sleep disorder, unspecified: Secondary | ICD-10-CM | POA: Insufficient documentation

## 2024-06-20 DIAGNOSIS — M150 Primary generalized (osteo)arthritis: Secondary | ICD-10-CM | POA: Diagnosis not present

## 2024-06-20 DIAGNOSIS — M797 Fibromyalgia: Secondary | ICD-10-CM | POA: Diagnosis not present

## 2024-06-20 DIAGNOSIS — M545 Low back pain, unspecified: Secondary | ICD-10-CM | POA: Diagnosis not present

## 2024-06-20 DIAGNOSIS — M79672 Pain in left foot: Secondary | ICD-10-CM | POA: Diagnosis not present
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
## 2024-06-27 ENCOUNTER — Ambulatory Visit (HOSPITAL_COMMUNITY): Admitting: Psychiatry

## 2024-06-28 ENCOUNTER — Encounter: Payer: Self-pay | Admitting: Podiatry

## 2024-06-28 ENCOUNTER — Ambulatory Visit: Admitting: Podiatry

## 2024-06-28 ENCOUNTER — Ambulatory Visit

## 2024-06-28 DIAGNOSIS — M2042 Other hammer toe(s) (acquired), left foot: Secondary | ICD-10-CM

## 2024-06-28 DIAGNOSIS — M2041 Other hammer toe(s) (acquired), right foot: Secondary | ICD-10-CM

## 2024-06-28 DIAGNOSIS — M21619 Bunion of unspecified foot: Secondary | ICD-10-CM | POA: Diagnosis not present

## 2024-06-28 DIAGNOSIS — R2681 Unsteadiness on feet: Secondary | ICD-10-CM

## 2024-06-28 DIAGNOSIS — M7752 Other enthesopathy of left foot: Secondary | ICD-10-CM | POA: Diagnosis not present

## 2024-06-29 NOTE — Progress Notes (Signed)
 Subjective:   Patient ID: [name], female   DOB: 78 y.o.   MRN: [id number]   HPI Patient presents with severe flatfoot deformity bilateral that also has very symptomatic bunion deformity bilateral and significant gait instability with history of falls.  Patient does not smoke and likes to be active if possible   Review of Systems  All other systems reviewed and are negative.       Objective:  Physical Exam Vitals and nursing note reviewed.  Constitutional:      Appearance: She is well-developed.  Pulmonary:     Effort: Pulmonary effort is normal.  Musculoskeletal:        General: Normal range of motion.  Skin:    General: Skin is warm.  Neurological:     Mental Status: She is alert.     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  Has severe bunion deformity left and right foot with symptomatic spurring history of surgery left years ago ineffective and chronic pain.  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  I do think first MPJ fusion would be valuable for her and I am referring to a physician in our group who does this procedure and I discussed the possibility for balance bracing which I think could give her stability across her ankle reduce the collapse foot structure.  Patient will be evaluated by pedorthist but wants to see physician for surgery first  X-rays indicate severe bunion deformity bilateral with arthritis of the first MPJ secondary to structure and severe collapse medial longitudinal arch bilateral

## 2024-07-10 ENCOUNTER — Ambulatory Visit (INDEPENDENT_AMBULATORY_CARE_PROVIDER_SITE_OTHER): Admitting: Family Medicine

## 2024-07-12 ENCOUNTER — Ambulatory Visit
Admission: RE | Admit: 2024-07-12 | Discharge: 2024-07-12 | Disposition: A | Source: Ambulatory Visit | Attending: Physical Medicine & Rehabilitation

## 2024-07-12 ENCOUNTER — Ambulatory Visit

## 2024-07-12 DIAGNOSIS — M2142 Flat foot [pes planus] (acquired), left foot: Secondary | ICD-10-CM | POA: Diagnosis not present

## 2024-07-12 DIAGNOSIS — G479 Sleep disorder, unspecified: Secondary | ICD-10-CM

## 2024-07-12 DIAGNOSIS — M2042 Other hammer toe(s) (acquired), left foot: Secondary | ICD-10-CM

## 2024-07-12 DIAGNOSIS — M7672 Peroneal tendinitis, left leg: Secondary | ICD-10-CM

## 2024-07-12 DIAGNOSIS — M47816 Spondylosis without myelopathy or radiculopathy, lumbar region: Secondary | ICD-10-CM | POA: Diagnosis not present

## 2024-07-12 DIAGNOSIS — M21619 Bunion of unspecified foot: Secondary | ICD-10-CM | POA: Diagnosis not present

## 2024-07-12 DIAGNOSIS — M2041 Other hammer toe(s) (acquired), right foot: Secondary | ICD-10-CM

## 2024-07-12 NOTE — Progress Notes (Signed)
 Subjective:  Patient ID: [name], female    DOB: 04-18-1946,  MRN: [id number]  Chief Complaint  Patient presents with   Foot Pain    Rm2 Patient complains of pain left foot 1st mtp joint /hurts with pressure and shoe gear/not diabetic.    78 y.o. female presents with the above complaint. Patient has a long history of pain to her left first metatarsophalangeal joint.  She did have a bunionectomy procedure done in the past with recurrence.  She states increasing pain to this left first metatarsophalangeal joint.  There is a bony prominence that she notes.  She does also complain of second hammertoe to the left.  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 Her right foot is beginning to develop symptomatic bunion pain, her focus currently is the left foot.  Review of Systems: Negative except as noted in the HPI. Denies N/V/F/Ch.  Past Medical History:  Diagnosis Date   Acne    Acute UTI 01/23/2014   to start ABX 01/24/2014   Allergy    Anxiety    Arthritis    feet, knee and base of thumbs   Asthma    controlled   Bilateral swelling of feet    Breast cancer (HCC) 2012   Right Breast Cancer   Breast cancer, stage 1 (HCC) 07/18/2011   Carpal tunnel syndrome    right   Dehydration 10/21/2011   Depression    Fibromyalgia    Fibromyalgia    Hepatitis 1976   hepatitis B   History of hiatal hernia    Hyperlipidemia    Hypertension    Insulin  resistance    Mononucleosis 1952   Multiple allergies    Nephritis 1955   Osteopenia    Personal history of chemotherapy 2012   Right Breast Cancer   PONV (postoperative nausea and vomiting)    has had to use scop patch    Current Outpatient Medications:    amoxicillin (AMOXIL) 500 MG capsule, Take 500 mg by mouth See admin instructions. Take 2,000 mg by mouth one hour prior to dental procedures, Disp: , Rfl:    Ascorbic Acid (VITAMIN C PO), Take 1 tablet by mouth daily  with breakfast., Disp: , Rfl:    buPROPion (WELLBUTRIN XL) 150 MG 24 hr tablet, Take 150 mg by mouth every morning., Disp: , Rfl:    cetirizine  HCl (ZYRTEC ) 5 MG/5ML SOLN, Take 10 mLs (10 mg total) by mouth daily., Disp: , Rfl:    Cholecalciferol (VITAMIN D -3) 5000 units TABS, Take 5,000 Units by mouth daily. 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)., Disp: , Rfl:    DULoxetine  (CYMBALTA ) 60 MG capsule, Take 1 capsule (60 mg total) by mouth daily., Disp: 30 capsule, Rfl: 4   EPIPEN  2-PAK 0.3 MG/0.3ML SOAJ injection, Inject 0.3 mg into the muscle as needed for anaphylaxis., Disp: , Rfl:    estradiol  (ESTRACE ) 0.1 MG/GM vaginal cream, Place 0.5g nightly for two weeks then twice a week after, Disp: 30 g, Rfl: 3   famotidine  (PEPCID ) 20 MG tablet, Take 20 mg by mouth 2 (two) times daily as needed (for allergic reactions (re: Alpha-Gal)).,  Disp: , Rfl:    fluticasone  (FLONASE ) 50 MCG/ACT nasal spray, Place 1 spray into both nostrils daily., Disp: , Rfl:    GEMTESA 75 MG TABS, Take 1 tablet by mouth daily., Disp: , Rfl:    lisinopril -hydrochlorothiazide  (ZESTORETIC ) 10-12.5 MG tablet, Take 1 tablet by mouth daily., Disp: , Rfl:    magnesium  gluconate (MAGONATE) 500 MG tablet, Take 500 mg by mouth at bedtime., Disp: , Rfl:    metFORMIN  (GLUCOPHAGE ) 500 MG tablet, Take 1 tablet (500 mg total) by mouth 2 (two) times daily with a meal., Disp: 180 tablet, Rfl: 0   Multiple Vitamin (MULTIVITAMIN) capsule, Take 1 capsule by mouth every morning. , Disp: , Rfl:    Omega-3 Fatty Acids (FISH OIL PO), Take 1,400 mg by mouth daily., Disp: , Rfl:    Quercetin 500 MG CAPS, Take by mouth., Disp: , Rfl:    rosuvastatin  (CRESTOR ) 5 MG tablet, Take 5 mg by mouth daily., Disp: , Rfl: 6    Suzetrigine  50 MG TABS, Take 50 mg by mouth every 12 (twelve) hours as needed. Take two 50mg  capsules for your first dose at the start of a pain episode on an empty stomach, followed by 50mg  (1 capsule)  with or without food every 12 hours until pain episode is improved, Disp: 29 tablet, Rfl: 0   traMADol  (ULTRAM ) 50 MG tablet, Take 50 mg by mouth every 6 (six) hours as needed (for pain)., Disp: , Rfl:    traZODone  (DESYREL ) 50 MG tablet, Take 50 mg by mouth at bedtime as needed for sleep., Disp: , Rfl:    tretinoin (RETIN-A) 0.1 % cream, Apply 1 application topically at bedtime., Disp: , Rfl:    TYLENOL  500 MG tablet, Take 500-1,000 mg by mouth every 6 (six) hours as needed for mild pain or headache., Disp: , Rfl:    Varenicline  Tartrate (TYRVAYA ) 0.03 MG/ACT SOLN, Place 1 spray into both nostrils 2 (two) times daily., Disp: , Rfl:   Social History   Tobacco Use  Smoking Status Never  Smokeless Tobacco Never    Allergies  Allergen Reactions   Alpha-Gal Anaphylaxis, Hives, Diarrhea and Other (See Comments)    NOTHING that includes anything mammal-related or sourced   Demerol [Meperidine] Nausea And Vomiting   Morphine Nausea And Vomiting and Other (See Comments)    PCA pump   Nsaids     Other Reaction(s): Unknown   Objective:  There were no vitals filed for this visit. There is no height or weight on file to calculate BMI. Constitutional Well developed. Well nourished. Oriented to person, place, and time.  Vascular Dorsalis pedis pulses palpable bilaterally. Posterior tibial pulses palpable bilaterally. Capillary refill normal to all digits.  No cyanosis or clubbing noted. Pedal hair growth normal.  Neurologic Normal speech. Epicritic sensation to light touch grossly present bilaterally. Negative tinel sign at tarsal tunnel bilaterally.   Dermatologic Skin texture and turgor are within normal limits.  No open wounds. No skin lesions.  Musculoskeletal left foot: 4 out of 5  muscle strength to posterior tibial tendon and with eversion, 5 out of 5 muscle strength of all other major pedal muscle groups.   Pain with periarticular palpation left first metatarsophalangeal joint.  Significant reduction in range of motion with pain.  Prominent dorsal osteophyte off of the proximal phalanx of the hallux.  Lateral deviation of the hallux with hammertoe of second digit overriding hallux.  2nd and 3rd hammertoes partially reducible.  Dorsiflexion of metatarsophalangeal joint, second  worse than third. Hindfoot valgus that is not fully reducible.  No pain to palpation of the sinus tarsi, posterior tibial tendon.  Gastrocnemius equinus present with ankle unable to reach 90 with knee extended, able to reach 90 with knee flexed.  Patient does have pain to palpation of peroneal tendons from posterior to the lateral malleolus towards the insertion of the fifth metatarsal base.   Radiographs: Previously taken x-rays were reviewed of the left foot.  This does demonstrate some significant arthritic changes to the hindfoot with pes planus foot deformity.  Increased talar declination, decreased calcaneal inclination, increased talonavicular uncoverage.  Widening of intermetatarsal 1-2 angle.  Evidence of previous medial eminence resection with recurrence of bunion deformity.  Arthritic changes to first metatarsophalangeal joint with abduction of the hallux.  Hammertoe deformities 2, 3.  Assessment:   1. Bunion   2. Hammer toes of both feet   3. Peroneal tendinitis of left lower extremity   4. Pes planus of left foot    Plan:  - Patient was evaluated and treated and all questions answered.  First metatarsophalangeal joint arthritis, hammertoe deformity to 2nd and 3rd digits - Discussed the diagnosis of first metatarsophalangeal joint arthritis/hallux limitus.  Patient does express that she fractured her proximal phalanx of the hallux which may have contributed to this.  She does have a very  prominent bone on the dorsal proximal phalanx.  Additionally, the second digit is overriding the hallux and third digit has some hammertoe deformity.  We discussed treatment options.  Patient does feel that she has exhausted conservative options.  Surgical intervention would likely consist of first metatarsophalangeal joint arthrodesis, 2nd and 3rd hammertoe repairs.  Possible shortening osteotomy second metatarsal. Discussed minimal WB day of surgery in CAM walker with gradual progression to full WB.   Peroneal tendinitis, left - Discussed with the patient the diagnosis of peroneal tendinitis due to weakening and pain.  Patient does express that she has significant pain in this area.  I discussed with her indication for MRI to evaluate the peroneal tendons and her hindfoot prior to any surgical intervention.  She expresses understanding of this and agrees.  MRI of left ankle and foot ordered.  Will consider operative peroneal tendon repair vs PT.   Pes planus, left - Discussed with the patient the diagnosis of pes planus with rigid deformity.  We discussed different treatment options for this.  She expresses that she cannot be nonweightbearing for an extended period of time and therefore cannot undergo double or triple arthrodesis to address the deformity.  She states that her foot is generally sore likely due to flatfoot but it is not as painful as peroneals or forefoot described above. - After foot reconstruction, patient would likely benefit from custom orthotic to best match patient anatomy and hold foot in rectus position to increase function and decrease pain.  RTC after MRI is completed for surgical planning    Prentice Ovens, DPM AACFAS Fellowship Trained Podiatric Surgeon [facility]

## 2024-07-17 ENCOUNTER — Encounter: Payer: Self-pay | Admitting: Radiology

## 2024-07-19 ENCOUNTER — Encounter (HOSPITAL_COMMUNITY): Payer: Self-pay | Admitting: Psychiatry

## 2024-07-19 ENCOUNTER — Other Ambulatory Visit: Payer: Self-pay

## 2024-07-19 ENCOUNTER — Ambulatory Visit (HOSPITAL_COMMUNITY): Admitting: Psychiatry

## 2024-07-19 VITALS — BP 139/80 | HR 83 | Ht 64.0 in | Wt 235.0 lb

## 2024-07-19 DIAGNOSIS — F324 Major depressive disorder, single episode, in partial remission: Secondary | ICD-10-CM | POA: Diagnosis not present

## 2024-07-19 NOTE — Progress Notes (Signed)
 Psychiatric Initial Adult Assessment   Patient Identification: [name] MRN:  [id number] Date of Evaluation:  07/19/2024 Referral Source:  Chief Complaint: Depressed mood Visit Diagnosis:   History of Present Illn  Today the patient is seen in the office.  The patient says she is significantly improved.  Her mood is actually much better.  She is laughing again.  She loves to read.  She continues having extensive evaluation about her feet and eventual surgery which would probably come up in the next few months.  She has a lot of discomfort and pain.  Overall though she is sleeping fairly well and eating well.  She has got good energy.  She loves to read fiction.  In the next few months she is going to have a sleep study as well.  Today she will continue taking the Cymbalta  at the higher dose and also the Wellbutrin.  She claims she is feeling improved and feels good.  Associated Signs/Symptoms: Depression Symptoms:  depressed mood, (Hypo) Manic Symptoms:   Anxiety Symptoms:   Psychotic Symptoms:   PTSD Symptoms: NA  Past Psychiatric History: Multiple antidepressants and multiple courses of psychotherapy.  She just recently ended therapy from a therapist at [facility].  Previous Psychotropic Medications: Yes  Substance Abuse History in the last 12 months:  No.  Consequences of Substance Abuse: NA  Past Medical History:  Past Medical History:  Diagnosis Date   Acne    Acute UTI 01/23/2014   to start ABX 01/24/2014   Allergy    Anxiety    Arthritis    feet, knee and base of thumbs   Asthma    controlled   Bilateral swelling of feet    Breast cancer (HCC) 2012   Right Breast Cancer   Breast cancer, stage 1 (HCC) 07/18/2011   Carpal tunnel syndrome    right   Dehydration 10/21/2011   Depression    Fibromyalgia    Fibromyalgia    Hepatitis 1976   hepatitis B   History of hiatal hernia    Hyperlipidemia    Hypertension    Insulin  resistance     Mononucleosis 1952   Multiple allergies    Nephritis 1955   Osteopenia    Personal history of chemotherapy 2012   Right Breast Cancer   PONV (postoperative nausea and vomiting)    has had to use scop patch    Past Surgical History:  Procedure Laterality Date   ABDOMINAL HYSTERECTOMY  09/15/1979   BREAST CAPSULECTOMY WITH IMPLANT EXCHANGE Right 03/02/2013   Procedure: BREAST CAPSULECTOMY WITH REPOSITIONING THE IMPLANT;  Surgeon: Estefana Reichert, DO;  Location: [facility];  Service: Plastics;  Laterality: Right;   BREAST SURGERY     BUNIONECTOMY  09/14/1996   left foot   CATARACT EXTRACTION Left    ETHMOIDECTOMY  09/14/1989   EYE SURGERY     facial plastic surgery  09/14/1994   JOINT REPLACEMENT Left 09/15/2003   KNEE ARTHROPLASTY Right 01/29/2014   Procedure: COMPUTER ASSISTED TOTAL KNEE ARTHROPLASTY;  Surgeon: Oneil JAYSON Herald, MD;  Location: [facility];  Service: Orthopedics;  Laterality: Right;  Right Total Knee Arthroplasty   KNEE ARTHROSCOPY  1996, 2001   left   lap band surgery  09/14/2008   LIPOSUCTION Bilateral 03/02/2013   Procedure: LIPOSUCTION;  Surgeon: Estefana Reichert, DO;  Location: [facility];  Service: Plastics;  Laterality: Bilateral;   MASTECTOMY Right 09/14/2010   w/chemo   MASTOPEXY  04/13/2012  Procedure: MASTOPEXY;  Surgeon: Estefana Reichert, DO;  Location: [facility];  Service: Plastics;  Laterality: Left;   left breast  mastopexy reduction for symmetry   NASAL SINUS SURGERY  09/15/1999   port-a-cath placement  06/26/2011   tip in lower SVC per chest x-ray read by Dr. Deward Dames   [facility] REMOVAL Left 03/02/2013   Procedure: REMOVAL PORT-A-CATH;  Surgeon: Donnice Bury, MD;  Location: [facility];  Service: General;  Laterality: Left;   right breast lumpectomy  09/14/1984   right ear surgery  09/14/1981   tumor removed (?)   right mastectomy  06/26/2011   TONSILLECTOMY  09/14/1948   TOTAL KNEE  ARTHROPLASTY  09/14/2004   left   TUBAL LIGATION  09/14/1976    Family Psychiatric History: Bipolar disorder  Family History:  Family History  Problem Relation Age of Onset   Depression Mother    Stroke Mother    Hypertension Mother    Heart disease Father    Hypertension Father    High Cholesterol Father    Breast cancer Paternal Grandmother    Breast cancer Paternal Aunt    Stroke Other    Hypertension Other    Heart disease Other    Uterine cancer Neg Hx    Rectal cancer Neg Hx    Bladder Cancer Neg Hx     Social History:   Social History   Socioeconomic History   Marital status: Single    Spouse name: Not on file   Number of children: Not on file   Years of education: Not on file   Highest education level: Not on file  Occupational History   Not on file  Tobacco Use   Smoking status: Never   Smokeless tobacco: Never  Vaping Use   Vaping status: Never Used  Substance and Sexual Activity   Alcohol use: No    Alcohol/week: 0.0 standard drinks of alcohol   Drug use: No   Sexual activity: Not Currently    Partners: Male    Birth control/protection: Surgical  Other Topics Concern   Not on file  Social History Narrative   Not on file   Social Drivers of Health   Financial Resource Strain: Not on file  Food Insecurity: Patient Declined (03/10/2023)   Hunger Vital Sign    Worried About Running Out of Food in the Last Year: Patient declined    Ran Out of Food in the Last Year: Patient declined  Transportation Needs: Unknown (03/10/2023)   PRAPARE - Administrator, Civil Service (Medical): Patient declined    Lack of Transportation (Non-Medical): Not on file  Physical Activity: Not on file  Stress: Not on file  Social Connections: Not on file   Additional Social History:   Allergies:   Allergies  Allergen Reactions   Alpha-Gal Anaphylaxis, Hives, Diarrhea and Other (See Comments)    NOTHING that includes anything mammal-related or sourced    Demerol [Meperidine] Nausea And Vomiting   Morphine Nausea And Vomiting and Other (See Comments)    PCA pump   Nsaids     Other Reaction(s): Unknown    Metabolic Disorder Labs: Lab Results  Component Value Date   HGBA1C 5.4 06/07/2024   No results found for: PROLACTIN Lab Results  Component Value Date   CHOL 184 06/07/2024   TRIG 82 06/07/2024   HDL 66 06/07/2024   LDLCALC 103 (H) 06/07/2024   LDLCALC 100 (H) 11/18/2023   Lab Results  Component Value Date   TSH 2.800 11/18/2023    Therapeutic Level Labs: No results found for: LITHIUM No results found for: CBMZ No results found for: VALPROATE  Current Medications: Current Outpatient Medications  Medication Sig Dispense Refill   amoxicillin (AMOXIL) 500 MG capsule Take 500 mg by mouth See admin instructions. Take 2,000 mg by mouth one hour prior to dental procedures     Ascorbic Acid (VITAMIN C PO) Take 1 tablet by mouth daily with breakfast.     buPROPion (WELLBUTRIN XL) 150 MG 24 hr tablet Take 150 mg by mouth every morning.     cetirizine  HCl (ZYRTEC ) 5 MG/5ML SOLN Take 10 mLs (10 mg total) by mouth daily.     Cholecalciferol (VITAMIN D -3) 5000 units TABS Take 5,000 Units by mouth daily. Plus K 2     co-enzyme Q-10 30 MG capsule Take 100 mg by mouth 3 (three) times daily.     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     DULoxetine  (CYMBALTA ) 60 MG capsule Take 1 capsule (60 mg total) by mouth daily. 30 capsule 4   EPIPEN  2-PAK 0.3 MG/0.3ML SOAJ injection Inject 0.3 mg into the muscle as needed for anaphylaxis.     estradiol  (ESTRACE ) 0.1 MG/GM vaginal cream Place 0.5g nightly for two weeks then twice a week after 30 g 3   famotidine  (PEPCID ) 20 MG tablet Take 20 mg by mouth 2 (two) times daily as needed (for allergic reactions (re: Alpha-Gal)).     fluticasone  (FLONASE ) 50 MCG/ACT nasal spray  Place 1 spray into both nostrils daily.     GEMTESA 75 MG TABS Take 1 tablet by mouth daily.     lisinopril -hydrochlorothiazide  (ZESTORETIC ) 10-12.5 MG tablet Take 1 tablet by mouth daily.     magnesium  gluconate (MAGONATE) 500 MG tablet Take 500 mg by mouth at bedtime.     metFORMIN  (GLUCOPHAGE ) 500 MG tablet Take 1 tablet (500 mg total) by mouth 2 (two) times daily with a meal. 180 tablet 0   Multiple Vitamin (MULTIVITAMIN) capsule Take 1 capsule by mouth every morning.      Omega-3 Fatty Acids (FISH OIL PO) Take 1,400 mg by mouth daily.     Quercetin 500 MG CAPS Take by mouth.     rosuvastatin  (CRESTOR ) 5 MG tablet Take 5 mg by mouth daily.  6   Suzetrigine  50 MG TABS Take 50 mg by mouth every 12 (twelve) hours as needed. Take two 50mg  capsules for your first dose at the start of a pain episode on an empty stomach, followed by 50mg  (1 capsule)  with or without food every 12 hours until pain episode is improved 29 tablet 0   traMADol  (ULTRAM ) 50 MG tablet Take 50 mg by mouth every 6 (six) hours as needed (for pain).     traZODone  (DESYREL ) 50 MG tablet Take 50 mg by mouth at bedtime as needed for sleep.     tretinoin (RETIN-A) 0.1 % cream Apply 1 application topically at bedtime.     TYLENOL  500 MG tablet Take 500-1,000 mg by mouth every 6 (six) hours as needed for mild pain or headache.     Varenicline  Tartrate (TYRVAYA ) 0.03 MG/ACT SOLN Place 1 spray into both nostrils 2 (two) times daily.     No current facility-administered medications for this visit.    Musculoskeletal: Strength & Muscle Tone: abnormal Gait &  Station: unsteady Patient leans:   Psychiatric Specialty Exam: Review of Systems  Blood pressure 139/80, pulse 83, height 5' 4 (1.626 m), weight 235 lb (106.6 kg).Body mass index is 40.34 kg/m.  General Appearance: Casual  Eye Contact:  Good  Speech:  Clear and Coherent  Volume:  Normal  Mood:  Depressed  Affect:  Appropriate  Thought Process:  Coherent  Orientation:   Full (Time, Place, and Person)  Thought Content:  WDL  Suicidal Thoughts:  No  Homicidal Thoughts:  No  Memory:  NA  Judgement:  Good  Insight:  Good  Psychomotor Activity:  Normal  Concentration:    Recall:  Good  Fund of Knowledge:Good  Language: Good  Akathisia:  No  Handed:  Right  AIMS (if indicated):  not done  Assets:  Desire for Improvement  ADL's:    Cognition: WNL  Sleep:  Good   Screenings: PHQ2-9    Flowsheet Row [facility] Office Visit from 03/31/2024 in [facility] Nutrition from 08/19/2017 in [facility]  - A Dept Of 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  She has residual symptoms.  She will continue taking low-dose Wellbutrin and continue 60 mg of Cymbalta .  Her pain is improved but most importantly as her mood is better.  Her anhedonia has lessened.  The patient was given some names of therapists at [facility] health care.  Hopefully she will contact one of them and answer them to therapy by the time I see her next in 3 months.  Collaboration of Care:   Patient/Guardian was advised Release of Information must be obtained prior to any record release in order to collaborate their care with an outside provider. Patient/Guardian was advised if they have not already done so to contact the registration department to sign all necessary forms in order for us  to release  information regarding their care.   Consent: Patient/Guardian gives verbal consent for treatment and assignment of benefits for services provided during this visit. Patient/Guardian expressed understanding and agreed to proceed.   Elna LILLETTE Lo, MD 11/5/20252:14 PM

## 2024-07-26 ENCOUNTER — Encounter: Payer: Self-pay | Admitting: Neurology

## 2024-07-26 ENCOUNTER — Ambulatory Visit: Admitting: Neurology

## 2024-07-26 VITALS — BP 169/79 | HR 67 | Ht 64.0 in | Wt 244.8 lb

## 2024-07-26 DIAGNOSIS — G478 Other sleep disorders: Secondary | ICD-10-CM | POA: Diagnosis not present

## 2024-07-26 DIAGNOSIS — G4719 Other hypersomnia: Secondary | ICD-10-CM | POA: Diagnosis not present

## 2024-07-26 DIAGNOSIS — R0683 Snoring: Secondary | ICD-10-CM

## 2024-07-26 DIAGNOSIS — R635 Abnormal weight gain: Secondary | ICD-10-CM | POA: Diagnosis not present

## 2024-07-26 DIAGNOSIS — R030 Elevated blood-pressure reading, without diagnosis of hypertension: Secondary | ICD-10-CM

## 2024-07-26 DIAGNOSIS — R351 Nocturia: Secondary | ICD-10-CM | POA: Diagnosis not present

## 2024-07-26 DIAGNOSIS — Z9189 Other specified personal risk factors, not elsewhere classified: Secondary | ICD-10-CM | POA: Diagnosis not present

## 2024-07-26 NOTE — Progress Notes (Signed)
 Subjective:    Patient ID: [name] is a 78 y.o. female.  HPI    True Mar, MD, PhD [facility] [address] [address] [place], [place] [address]   Dear Dr. Urbano,   I saw your patient, [name], upon your kind request in my sleep clinic today for initial consultation of her sleep disorder, in particular, concern for underlying obstructive sleep apnea.  The patient is unaccompanied today.  As you know, [name] is a 78 year old female with an underlying complex medical history of breast cancer with status post right mastectomy, arthritis with status post bilateral knee replacement surgeries, allergy, asthma, lower extremity swelling, fibromyalgia, hypertension, hyperlipidemia, insulin  resistance, osteopenia, and severe obesity with a BMI of over 40, status post lap band surgery in 2010, who reports sleep disturbances for years.  She has been on trazodone  for sleep maintenance issues for years per PCP, takes 50 mg at bedtime.  She is divorced for many years and lives alone, she has been told by her son that she snores.  She has never had a sleep study.  Her Epworth sleepiness score is 10 out of 24, fatigue severity score is 48 out of 63.  I reviewed your office note from 06/20/2024.  She presented with chronic pain.  She reported an overactive bladder and nonrestorative sleep.  She has nocturia about 1-3 times per average night.  She denies recurrent nocturnal or morning headaches.  She had a tonsillectomy at age 1.  She drinks decaf coffee, 22 ounces per day and occasional decaf tea.  She does not drink any alcohol.  She is a non-smoker.  She has chronic pain including bilateral foot pain and may need surgeries to both feet.  She has fallen and uses a cane for stability.  She fell a few weeks ago and reports that she hit her head against a chair, she did not pass out or have any bruising and did not seek medical attention, denies any headache.   Her blood pressure is mildly elevated today and she denies any symptoms from it.  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  She had surgery to the right inner ear at the time with reconstruction as I understand. She has had weight gain over the past year, in the realm of 20 pounds.  She is a restless sleeper and reports that she will not be able to sleep in the sleep lab and that she does not believe she would tolerate a CPAP machine. Bedtime is around 10 PM and rise time around 7 AM.  She has 1 dog in the household.  Her Past Medical History Is Significant For: Past Medical History:  Diagnosis Date   Acne    Acute UTI 01/23/2014   to start ABX 01/24/2014   Allergy    Anxiety    Arthritis    feet, knee and base of thumbs   Asthma    controlled   Bilateral swelling of feet    Breast cancer (HCC) 2012   Right Breast Cancer   Breast cancer, stage 1 (HCC) 07/18/2011   Carpal tunnel syndrome    right   Dehydration 10/21/2011   Depression    Fibromyalgia    Fibromyalgia    Hepatitis 1976   hepatitis B   History of hiatal hernia    Hyperlipidemia    Hypertension    Insulin  resistance    Mononucleosis 1952  Multiple allergies    Nephritis 1955   Osteopenia    Personal history of chemotherapy 2012   Right Breast Cancer   PONV (postoperative nausea and vomiting)    has had to use scop patch    Her Past Surgical History Is Significant For: Past Surgical History:  Procedure Laterality Date   ABDOMINAL HYSTERECTOMY  09/15/1979   BREAST CAPSULECTOMY WITH IMPLANT EXCHANGE Right 03/02/2013   Procedure: BREAST CAPSULECTOMY WITH REPOSITIONING THE IMPLANT;  Surgeon: Estefana Reichert, DO;  Location: [facility];  Service: Plastics;  Laterality: Right;   BREAST SURGERY     BUNIONECTOMY  09/14/1996   left foot   CATARACT EXTRACTION Left    ETHMOIDECTOMY  09/14/1989   EYE SURGERY     facial plastic surgery  09/14/1994   JOINT  REPLACEMENT Left 09/15/2003   KNEE ARTHROPLASTY Right 01/29/2014   Procedure: COMPUTER ASSISTED TOTAL KNEE ARTHROPLASTY;  Surgeon: Oneil JAYSON Herald, MD;  Location: [facility];  Service: Orthopedics;  Laterality: Right;  Right Total Knee Arthroplasty   KNEE ARTHROSCOPY  1996, 2001   left   lap band surgery  09/14/2008   LIPOSUCTION Bilateral 03/02/2013   Procedure: LIPOSUCTION;  Surgeon: Estefana Reichert, DO;  Location: [facility];  Service: Plastics;  Laterality: Bilateral;   MASTECTOMY Right 09/14/2010   w/chemo   MASTOPEXY  04/13/2012   Procedure: MASTOPEXY;  Surgeon: Estefana Reichert, DO;  Location: [facility];  Service: Plastics;  Laterality: Left;   left breast  mastopexy reduction for symmetry   NASAL SINUS SURGERY  09/15/1999   port-a-cath placement  06/26/2011   tip in lower SVC per chest x-ray read by Dr. Deward Dames   [facility] REMOVAL Left 03/02/2013   Procedure: REMOVAL PORT-A-CATH;  Surgeon: Donnice Bury, MD;  Location:  SURGERY CENTER;  Service: General;  Laterality: Left;   right breast lumpectomy  09/14/1984   right ear surgery  09/14/1981   tumor removed (?)   right mastectomy  06/26/2011   TONSILLECTOMY  09/14/1948   TOTAL KNEE ARTHROPLASTY  09/14/2004   left   TUBAL LIGATION  09/14/1976    Her Family History Is Significant For: Family History  Problem Relation Age of Onset   Depression Mother    Stroke Mother    Hypertension Mother    Heart disease Father    Hypertension Father    High Cholesterol Father    Breast cancer Paternal Grandmother    Breast cancer Paternal Aunt    Stroke Other    Hypertension Other    Heart disease Other    Uterine cancer Neg Hx    Rectal cancer Neg Hx    Bladder Cancer Neg Hx     Her Social History Is Significant For: Social History   Socioeconomic History   Marital status: Single    Spouse name: Not on file   Number of children: Not on file   Years of education: Not on file   Highest  education level: Not on file  Occupational History   Not on file  Tobacco Use   Smoking status: Never   Smokeless tobacco: Never  Vaping Use   Vaping status: Never Used  Substance and Sexual Activity   Alcohol use: No    Alcohol/week: 0.0 standard drinks of alcohol   Drug use: No   Sexual activity: Not Currently    Partners: Male    Birth control/protection: Surgical  Other Topics Concern   Not on file  Social  History Narrative   22oz of decaf coffee in the morning    Social Drivers of Health   Financial Resource Strain: Not on file  Food Insecurity: Patient Declined (03/10/2023)   Hunger Vital Sign    Worried About Running Out of Food in the Last Year: Patient declined    Ran Out of Food in the Last Year: Patient declined  Transportation Needs: Unknown (03/10/2023)   PRAPARE - Administrator, Civil Service (Medical): Patient declined    Lack of Transportation (Non-Medical): Not on file  Physical Activity: Not on file  Stress: Not on file  Social Connections: Not on file    Her Allergies Are:  Allergies  Allergen Reactions   Alpha-Gal Anaphylaxis, Hives, Diarrhea and Other (See Comments)    NOTHING that includes anything mammal-related or sourced   Demerol [Meperidine] Nausea And Vomiting   Morphine Nausea And Vomiting and Other (See Comments)    PCA pump   Nsaids     Other Reaction(s): Unknown  :   Her Current Medications Are:  Outpatient Encounter Medications as of 07/26/2024  Medication Sig   amoxicillin (AMOXIL) 500 MG capsule Take 500 mg by mouth See admin instructions. Take 2,000 mg by mouth one hour prior to dental procedures   Ascorbic Acid (VITAMIN C PO) Take 1 tablet by mouth daily with breakfast.   buPROPion (WELLBUTRIN XL) 150 MG 24 hr tablet Take 150 mg by mouth every morning.   cetirizine  HCl (ZYRTEC ) 5 MG/5ML SOLN Take 10 mLs (10 mg total) by mouth daily.   Cholecalciferol (VITAMIN D -3) 5000 units TABS Take 5,000 Units by mouth daily.  Plus K 2   co-enzyme Q-10 30 MG capsule Take 100 mg by mouth 3 (three) times daily.   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   DULoxetine  (CYMBALTA ) 60 MG capsule Take 1 capsule (60 mg total) by mouth daily.   EPIPEN  2-PAK 0.3 MG/0.3ML SOAJ injection Inject 0.3 mg into the muscle as needed for anaphylaxis.   estradiol  (ESTRACE ) 0.1 MG/GM vaginal cream Place 0.5g nightly for two weeks then twice a week after   famotidine  (PEPCID ) 20 MG tablet Take 20 mg by mouth 2 (two) times daily as needed (for allergic reactions (re: Alpha-Gal)).   fluticasone  (FLONASE ) 50 MCG/ACT nasal spray Place 1 spray into both nostrils daily.   GEMTESA 75 MG TABS Take 1 tablet by mouth daily.   lisinopril -hydrochlorothiazide  (ZESTORETIC ) 10-12.5 MG tablet Take 1 tablet by mouth daily.   magnesium  gluconate (MAGONATE) 500 MG tablet Take 500 mg by mouth at bedtime.   metFORMIN  (GLUCOPHAGE ) 500 MG tablet Take 1 tablet (500 mg total) by mouth 2 (two) times daily with a meal.   Multiple Vitamin (MULTIVITAMIN) capsule Take 1 capsule by mouth every morning.    NALTREXONE HCL PO Take 1 tablet by mouth 2 (two) times daily.   Omega-3 Fatty Acids (FISH OIL PO) Take 1,400 mg by mouth daily.   Quercetin 500 MG CAPS Take by mouth.   rosuvastatin  (CRESTOR ) 5 MG tablet Take 5 mg by mouth daily.   Suzetrigine  50 MG TABS Take 50 mg by mouth every 12 (twelve) hours as needed. Take two 50mg  capsules for your first dose at the start of a pain episode on an empty stomach, followed by 50mg  (1 capsule)  with or without food every 12 hours until pain episode is improved  traMADol  (ULTRAM ) 50 MG tablet Take 50 mg by mouth every 6 (six) hours as needed (for pain).   traZODone  (DESYREL ) 50 MG tablet Take 50 mg by mouth at bedtime as needed for sleep.   tretinoin (RETIN-A) 0.1 % cream Apply 1 application topically at bedtime.    TYLENOL  500 MG tablet Take 500-1,000 mg by mouth every 6 (six) hours as needed for mild pain or headache.   Varenicline  Tartrate (TYRVAYA ) 0.03 MG/ACT SOLN Place 1 spray into both nostrils 2 (two) times daily.   No facility-administered encounter medications on file as of 07/26/2024.  :   Review of Systems:  Out of a complete 14 point review of systems, all are reviewed and negative with the exception of these symptoms as listed below:   Review of Systems  Objective:  Neurological Exam  Physical Exam Physical Examination:   Vitals:   07/26/24 1252  BP: (!) 169/79  Pulse: 67  SpO2: 98%    General Examination: The patient is a very pleasant 78 y.o. female in no acute distress. She appears well-developed and well-nourished and well groomed.   HEENT: Normocephalic, atraumatic, pupils are equal, round and reactive to light, extraocular tracking is good without limitation to gaze excursion or nystagmus noted. No photophobia.  No Corrective eye glasses in place. Hearing is grossly intact.  Face is asymmetric with the left eyelid droopiness noted, not new per patient.  She reports that since her craniotomy in the 80s she has had asymmetry to her face.   Speech is clear without dysarthria. There is no hypophonia. There is no lip, neck/head, jaw or voice tremor. Neck is supple with full range of passive and active motion. There are no carotid bruits on auscultation.  Airway/Oropharynx exam reveals: moderate mouth dryness, adequate dental hygiene and mild airway crowding, due to small airway entry, redundant soft palate, Mallampati class II, neck circumference 15 three-quarter inches, minimal overbite noted.  Tonsils absent.  Tongue protrudes centrally and palate elevates symmetrically.  Chest: Clear to auscultation without wheezing, rhonchi or crackles noted.  Heart: S1+S2+0, regular and normal without murmurs, rubs or gallops noted.   Abdomen: Soft, non-tender and  non-distended.  Extremities: There is swelling noted in the distal lower extremities bilaterally.   Skin: Warm and dry without trophic changes noted.   Musculoskeletal: exam reveals no obvious joint deformities.   Neurologically:  Mental status: The patient is awake, alert and oriented in all 4 spheres. Her immediate and remote memory, attention, language skills and fund of knowledge are appropriate. There is no evidence of aphasia, agnosia, apraxia or anomia. Speech is clear with normal prosody and enunciation. Thought process is linear. Mood is normal and affect is normal.  Cranial nerves II - XII are as described above under HEENT exam.  Motor exam: Normal bulk, moving all 4 extremities, limited movements in her feet, walks with a single-point cane.  Fine motor skills and coordination: Intact grossly intact in the upper extremities bilaterally.  Cerebellar testing: No dysmetria or intention tremor. There is no truncal or gait ataxia.  Sensory exam: intact to light touch in the upper and lower extremities.  Gait, station and balance: She stands without major difficulty but has a tendency to stand a little wider based.  She walks with a single-point cane.   Assessment and Plan:  In summary, KEYSHAWNA PROUSE is a very pleasant 78 y.o.-year old female with an underlying complex medical history of breast cancer with status post right mastectomy, arthritis with status  post bilateral knee replacement surgeries, allergy, asthma, lower extremity swelling, fibromyalgia, hypertension, hyperlipidemia, insulin  resistance, osteopenia, and severe obesity with a BMI of over 40, status post lap band surgery in 2010, whose history and physical exam are concerning for sleep disordered breathing, particularly obstructive sleep apnea (OSA).  While a laboratory attended sleep study is typically considered gold standard for evaluation of sleep disordered breathing, the patient would prefer a home sleep test.   I had  a long chat with the patient about my findings and the diagnosis of sleep apnea, particularly OSA, its prognosis and treatment options. We talked about medical/conservative treatments, surgical interventions and non-pharmacological approaches for symptom control. I explained, in particular, the risks and ramifications of untreated moderate to severe OSA, especially with respect to developing cardiovascular disease down the road, including congestive heart failure (CHF), difficult to treat hypertension, cardiac arrhythmias (particularly A-fib), neurovascular complications including TIA, stroke and dementia. Even type 2 diabetes has, in part, been linked to untreated OSA. Symptoms of untreated OSA may include (but may not be limited to) daytime sleepiness, nocturia (i.e. frequent nighttime urination), memory problems, mood irritability and suboptimally controlled or worsening mood disorder such as depression and/or anxiety, lack of energy, lack of motivation, physical discomfort, as well as recurrent headaches, especially morning or nocturnal headaches. We talked about the importance of maintaining a healthy lifestyle and striving for healthy weight.  I recommended a sleep study at this time. I outlined the differences between a laboratory attended sleep study which is considered more comprehensive and accurate over the option of a home sleep test (HST); the latter may lead to underestimation of sleep disordered breathing in some instances and does not help with diagnosing upper airway resistance syndrome and is not accurate enough to diagnose primary central sleep apnea typically. I outlined possible surgical and non-surgical treatment options of OSA, including the use of a positive airway pressure (PAP) device (i.e. CPAP, AutoPAP/APAP or BiPAP in certain circumstances), a custom-made dental device (aka oral appliance, which would require a referral to a specialist dentist or orthodontist typically, and is  generally speaking not considered for patients with full dentures or edentulous state), upper airway surgical options, such as traditional UPPP (which is not considered a first-line treatment) or the Inspire device (hypoglossal nerve stimulator, which would involve a referral for consultation with an ENT surgeon, after careful selection, following inclusion criteria - also not first-line treatment). I explained the PAP treatment option to the patient in detail, as this is generally considered first-line treatment.  The patient indicated that she would be reluctant but not unwilling to try PAP therapy, if the need arises. We will pick up our discussion about the next steps and treatment options after testing.  We will keep her posted as to the test results by phone call and/or MyChart messaging where possible.  We will plan to follow-up in sleep clinic accordingly as well.  I answered all her questions today and the patient was in agreement.   I encouraged her to call with any interim questions, concerns, problems or updates or email us  through MyChart.  Generally speaking, sleep test authorizations may take up to 2 weeks, sometimes less, sometimes longer, the patient is encouraged to get in touch with us  if they do not hear back from the sleep lab staff directly within the next 2 weeks.  Thank you very much for allowing me to participate in the care of this nice patient. If I can be of any further assistance to  you please do not hesitate to call me at [phone].  Sincerely,   True Mar, MD, PhD

## 2024-07-26 NOTE — Patient Instructions (Signed)

## 2024-07-31 ENCOUNTER — Ambulatory Visit: Admitting: Sports Medicine

## 2024-08-01 ENCOUNTER — Other Ambulatory Visit: Payer: Self-pay | Admitting: Internal Medicine

## 2024-08-01 DIAGNOSIS — Z1231 Encounter for screening mammogram for malignant neoplasm of breast: Secondary | ICD-10-CM

## 2024-08-03 ENCOUNTER — Encounter: Payer: Self-pay | Admitting: Physical Medicine & Rehabilitation

## 2024-08-15 ENCOUNTER — Ambulatory Visit (INDEPENDENT_AMBULATORY_CARE_PROVIDER_SITE_OTHER): Payer: Self-pay | Admitting: Family Medicine

## 2024-08-23 ENCOUNTER — Ambulatory Visit: Admitting: Obstetrics

## 2024-08-29 ENCOUNTER — Ambulatory Visit

## 2024-09-05 NOTE — Progress Notes (Unsigned)
 "  SUBJECTIVE: Discussed the use of AI scribe software for clinical note transcription with the patient, who gave verbal consent to proceed.  Chief Complaint: Obesity  Interim History: She is up 11 lbs since her last visit on 06/07/24  Miette is here to discuss her progress with her obesity treatment plan. She is on the practicing portion control and making smarter food choices, such as increasing vegetables and decreasing simple carbohydrates and states she is not following her eating plan . She states she is not exercising.  HENRIETTE HESSER is a 78 year old female who presents for follow-up on her obesity treatment plan.  She has gained eleven pounds since her last visit, with an increase in adipose mass and adipose rating. She follows a portion control meal plan but struggles with her diet, reporting a lack of interest in food and skipping meals. She is not consuming the recommended amount of protein, not drinking enough water, and not sleeping at least seven hours nightly. She attributes some of her dietary challenges to the effects of low dose naltrexone, which she started at one mg and increased to three mg over the past few weeks. She describes a significant decrease in appetite and interest in food, stating 'even when I'm hungry, nothing sounds good.'  She is currently on metformin , taking two tablets with breakfast, and wants to reduce this to one tablet if possible. Her A1c has improved and is no longer in the prediabetic range, and her insulin  level has decreased slightly. She is also on Cymbalta , which she notes has helped with pain but may contribute to her weight gain.  She has a history of foot pain and has been consulting with multiple orthopedic surgeons. She found relief with an injection from Dr. Pasco Failing, which alleviated her pain significantly. She is planning to have foot surgery and is scheduled for an MRI. She has been less mobile due to her foot issues, which she  acknowledges has impacted her weight management.  She has a history of alpha-gal syndrome, which has been in remission for a year. She reports being cold-natured for the past twenty years but notes an improvement in this symptom recently.  No interest in food, decreased appetite, and difficulty with meal planning. She experiences constipation, which she attributes to her medication use and decreased mobility. No significant nausea or vomiting currently. OBJECTIVE: Visit Diagnoses: Problem List Items Addressed This Visit     Hyperlipidemia   Obesity, unspecified   Relevant Medications   metFORMIN  (GLUCOPHAGE ) 500 MG tablet   Insulin  resistance - Primary   Relevant Medications   metFORMIN  (GLUCOPHAGE ) 500 MG tablet   BMI 40.0-44.9, adult (HCC)   Relevant Medications   metFORMIN  (GLUCOPHAGE ) 500 MG tablet   Other Visit Diagnoses       Foot pain, bilateral         Vitamin D  deficiency         Depression (HCC), with emotional eating behavior          Class 3 obesity with insulin  resistance Gained 11 pounds since last visit, primarily in adipose mass. Reports decreased appetite and difficulty maintaining a meal plan due to low dose naltrexone and Cymbalta . A1c has decreased and is no longer in the prediabetic range. Insulin  level decreased slightly but remains elevated. Current medication regimen includes metformin , which is effective in managing insulin  resistance. Discussed potential impact of naltrexone and Cymbalta  on appetite and weight gain. Consideration of GLP-1 medications deferred due to current appetite  suppression and risk of further metabolic suppression. - Changed metformin  to 1000 mg once daily with breakfast. - Encouraged intake of 1200 calories and 90 grams of protein daily. - Explore meal prep options like [employer] Meals for convenience and nutrition. - Consider Atkins shakes to supplement protein intake. - Discuss with pain management provider about potential  adjustments to naltrexone and Cymbalta .   Insulin  Resistance Last fasting insulin  was 5.4- at goal. A1c was 14.0- slightly improved, but not at goal. Polyphagia:Yes Medication(s): metformin  1000 mg daily Reports no GI side effects.  Lab Results  Component Value Date   HGBA1C 5.4 06/07/2024   HGBA1C 5.6 11/18/2023   Lab Results  Component Value Date   INSULIN  14.0 06/07/2024   INSULIN  15.5 11/18/2023    Plan: Continue and refill metformin  1000 mg daily Continue working on nutrition plan to decrease simple carbohydrates, increase lean proteins and exercise to promote weight loss, improve glycemic control and prevent progression to Type 2 diabetes.   Constipation Reports ongoing constipation, likely exacerbated by medication use and decreased physical activity. Discussed importance of movement and dietary adjustments to alleviate symptoms. - Encouraged increased physical activity as tolerated. - Encouraged dietary adjustments to include more fiber and fluids.  Foot pain/chronic pain issues She has a history of foot pain and has been consulting with multiple orthopedic surgeons. She found relief with an injection from Dr. Pasco Ovens, which alleviated her pain significantly. She is planning to have foot surgery and is scheduled for an MRI. She has been less mobile due to her foot issues, which she acknowledges has impacted her weight management.   Vitals Temp: 98.4 F (36.9 C) BP: 138/79 Pulse Rate: 75 SpO2: 90 %   Anthropometric Measurements Height: 5' 3 (1.6 m) Weight: 243 lb (110.2 kg) BMI (Calculated): 43.06 Weight at Last Visit: 232 lb Weight Lost Since Last Visit: 0 Weight Gained Since Last Visit: 11 lb Starting Weight: 241 lb Total Weight Loss (lbs): 0 lb (0 kg)   Body Composition  Body Fat %: 56 % Fat Mass (lbs): 136.2 lbs Muscle Mass (lbs): 101.6 lbs Visceral Fat Rating : 21   Other Clinical Data Fasting: No Labs: No Today's Visit #: 10 Starting  Date: 11/18/23     ASSESSMENT AND PLAN:  Diet: Elan is currently in the action stage of change. As such, her goal is to get back to weight loss efforts. She has agreed to keeping a food journal and adhering to recommended goals of 1200 calories and 90+ grams of protein.  Exercise: Tifani has been instructed to try a geriatric exercise plan and that some exercise is better than none for weight loss and overall health benefits.   Behavior Modification:  We discussed the following Behavioral Modification Strategies today: increasing lean protein intake, decreasing simple carbohydrates, increasing vegetables, increase H2O intake, increase high fiber foods, no skipping meals, meal planning and cooking strategies, holiday eating strategies, avoiding temptations, planning for success, and keep a strict food journal. We discussed various medication options to help [facility] with her weight loss efforts and we both agreed to continue metformin  for primary indication of insulin  resistance.  Return in about 6 weeks (around 10/18/2024).SABRA She was informed of the importance of frequent follow up visits to maximize her success with intensive lifestyle modifications for her multiple health conditions.  Attestation Statements:   Reviewed by clinician on day of visit: allergies, medications, problem list, medical history, surgical history, family history, social history, and previous encounter notes.  Time spent on visit including pre-visit chart review and post-visit care and charting was *** minutes.    Celenia Hruska, PA-C  "

## 2024-09-06 ENCOUNTER — Ambulatory Visit (INDEPENDENT_AMBULATORY_CARE_PROVIDER_SITE_OTHER): Admitting: Physician Assistant

## 2024-09-06 ENCOUNTER — Encounter (INDEPENDENT_AMBULATORY_CARE_PROVIDER_SITE_OTHER): Payer: Self-pay | Admitting: Physician Assistant

## 2024-09-06 VITALS — BP 138/79 | HR 75 | Temp 98.4°F | Ht 63.0 in | Wt 243.0 lb

## 2024-09-06 DIAGNOSIS — E782 Mixed hyperlipidemia: Secondary | ICD-10-CM

## 2024-09-06 DIAGNOSIS — E88819 Insulin resistance, unspecified: Secondary | ICD-10-CM

## 2024-09-06 DIAGNOSIS — K5900 Constipation, unspecified: Secondary | ICD-10-CM

## 2024-09-06 DIAGNOSIS — F339 Major depressive disorder, recurrent, unspecified: Secondary | ICD-10-CM

## 2024-09-06 DIAGNOSIS — Z6841 Body Mass Index (BMI) 40.0 and over, adult: Secondary | ICD-10-CM

## 2024-09-06 DIAGNOSIS — E66813 Obesity, class 3: Secondary | ICD-10-CM | POA: Diagnosis not present

## 2024-09-06 DIAGNOSIS — M79672 Pain in left foot: Secondary | ICD-10-CM

## 2024-09-06 DIAGNOSIS — M79671 Pain in right foot: Secondary | ICD-10-CM

## 2024-09-06 DIAGNOSIS — E559 Vitamin D deficiency, unspecified: Secondary | ICD-10-CM

## 2024-09-06 MED ORDER — METFORMIN HCL 500 MG PO TABS: 1000.0000 mg | ORAL_TABLET | Freq: Every day | ORAL | 0 refills | Status: DC

## 2024-09-18 ENCOUNTER — Ambulatory Visit (INDEPENDENT_AMBULATORY_CARE_PROVIDER_SITE_OTHER): Admitting: Family Medicine

## 2024-09-18 ENCOUNTER — Telehealth (INDEPENDENT_AMBULATORY_CARE_PROVIDER_SITE_OTHER): Payer: Self-pay | Admitting: Physician Assistant

## 2024-09-18 MED ORDER — METFORMIN HCL 1000 MG PO TABS: 1000.0000 mg | ORAL_TABLET | Freq: Every day | ORAL | 0 refills | Status: AC

## 2024-09-18 NOTE — Telephone Encounter (Signed)
 Called and spoke with patient, per patient the prescription was supposed to be written for Metformin  1000 mg once daily. Rx sent to [facility] on [address] per patient's request. Patient aware prescription has been sent in.

## 2024-09-18 NOTE — Telephone Encounter (Signed)
 Good morning!   The metformin  script for 1000mg  metformin  has yet to be called in. She said they spoke about switching from 2 500mg  tabs to 1 1000 mg tabs. [facility] to [facility] at [address]

## 2024-09-19 ENCOUNTER — Encounter: Payer: Self-pay | Admitting: Obstetrics

## 2024-09-19 MED ORDER — GEMTESA 75 MG PO TABS: 1.0000 | ORAL_TABLET | Freq: Every day | ORAL | 0 refills | Status: DC

## 2024-09-22 ENCOUNTER — Ambulatory Visit
Admission: RE | Admit: 2024-09-22 | Discharge: 2024-09-22 | Disposition: A | Source: Ambulatory Visit | Attending: Internal Medicine | Admitting: Internal Medicine

## 2024-09-22 DIAGNOSIS — Z1231 Encounter for screening mammogram for malignant neoplasm of breast: Secondary | ICD-10-CM

## 2024-09-25 ENCOUNTER — Ambulatory Visit: Admission: RE | Admit: 2024-09-25 | Discharge: 2024-09-25 | Disposition: A | Source: Ambulatory Visit

## 2024-09-25 DIAGNOSIS — M21619 Bunion of unspecified foot: Secondary | ICD-10-CM

## 2024-09-25 DIAGNOSIS — M2041 Other hammer toe(s) (acquired), right foot: Secondary | ICD-10-CM

## 2024-09-25 DIAGNOSIS — M7672 Peroneal tendinitis, left leg: Secondary | ICD-10-CM

## 2024-09-27 ENCOUNTER — Encounter

## 2024-09-27 ENCOUNTER — Ambulatory Visit: Payer: Self-pay

## 2024-10-06 ENCOUNTER — Other Ambulatory Visit (HOSPITAL_COMMUNITY): Payer: Self-pay | Admitting: *Deleted

## 2024-10-06 ENCOUNTER — Ambulatory Visit: Admitting: Obstetrics

## 2024-10-06 ENCOUNTER — Other Ambulatory Visit (HOSPITAL_COMMUNITY): Payer: Self-pay | Admitting: Psychiatry

## 2024-10-06 ENCOUNTER — Encounter: Admitting: Physical Medicine & Rehabilitation

## 2024-10-06 MED ORDER — DULOXETINE HCL 60 MG PO CPEP: 60.0000 mg | ORAL_CAPSULE | Freq: Every day | ORAL | 0 refills | Status: DC

## 2024-10-12 ENCOUNTER — Ambulatory Visit

## 2024-10-12 NOTE — Progress Notes (Signed)
 [facility] Return Visit  SUBJECTIVE  History of Present Illness: [name] is a 79 y.o. female seen in follow-up for overactive bladder, pelvic pain, sensation of incomplete emptying, vaginal atrophy with history of breast cancer, BMI, and nocturia. Plan at last visit was referral to pelvic floor PT, low dose vaginal estrogen, continue Gemtesa  at bedtime, fluid management and caffeine reduction.   Underwent pelvic floor PT x 1, declines returning at the moment due to pending foot surgery triggering fibromyalgia symptoms. Difficulty due to limited appts. Reports resolution of sensation of incomplete emptying which was previously most bothersome. Previously most bothered by nocturia 1-3x/night, now it's with leakage when she stands up at night 1-2x/day Using Gemtesa  at bedtime Using vaginal estrogen 2x/week Drinks: 64-80oz/day, 24oz decaf coffee down to 22oz decaf coffee Going to medical weight management, reports limited ability to lose weight due to foot pain. Desires to postpone sleep study until after treatment of foot pain  07/25/14 vUDS: 1st sens 266, normal desire 281, strong desire 290, MCC . DO, + SUI, VLPP 52cmH2O. Pressure flow void , Qmax 19mL/sec, Pdet Qmax 35-40cmH2O. Dyssynergic EMG and straining. Bladder neck mild funneling and 2cm descent. No reflux Tried mirabegron  50mg  + Toviaz 8mg  since 2015 Evaluated by Dr. Gaston (urology), started on Gemtesa  with improvement of dry eyes and reduction of daytime leakage.  Prior use of Vesicare (dry mouth, eyes, headaches)   Past Medical History: Patient  has a past medical history of Acne, Acute UTI (01/23/2014), Allergy, Anxiety, Arthritis, Asthma, Bilateral swelling of feet, Breast cancer (HCC) (2012), Breast cancer, stage 1 (HCC) (07/18/2011), Carpal tunnel syndrome, Dehydration (10/21/2011), Depression, Fibromyalgia, Fibromyalgia, Hepatitis (1976), History of hiatal hernia, Hyperlipidemia, Hypertension,  Insulin  resistance, Mononucleosis (1952), Multiple allergies, Nephritis (1955), Osteopenia, Personal history of chemotherapy (2012), and PONV (postoperative nausea and vomiting).   Past Surgical History: She  has a past surgical history that includes Tonsillectomy (09/14/1948); Tubal ligation (09/14/1976); Abdominal hysterectomy (09/15/1979); right ear surgery (09/14/1981); right breast lumpectomy (09/14/1984); Ethmoidectomy (09/14/1989); Knee arthroscopy (1996, 2001); facial plastic surgery (09/14/1994); Bunionectomy (09/14/1996); Total knee arthroplasty (09/14/2004); Nasal sinus surgery (09/15/1999); lap band surgery (09/14/2008); right mastectomy (06/26/2011); port-a-cath placement (06/26/2011); Mastopexy (04/13/2012); Breast capsulectomy with implant exchange (Right, 03/02/2013); Liposuction (Bilateral, 03/02/2013); Port-a-cath removal (Left, 03/02/2013); Joint replacement (Left, 09/15/2003); Mastectomy (Right, 09/14/2010); Eye surgery; Breast surgery; Knee Arthroplasty (Right, 01/29/2014); and Cataract extraction (Left).   Medications: She has a current medication list which includes the following prescription(s): amoxicillin, ascorbic acid, bupropion , cetirizine  hcl, vitamin d -3, co-enzyme q-10, diphenhydramine , duloxetine , epipen  2-pak, estradiol , famotidine , fluticasone , gemtesa , lisinopril -hydrochlorothiazide , magnesium  gluconate, metformin , multivitamin, naltrexone hcl, omega-3 fatty acids, quercetin, rosuvastatin , suzetrigine , tramadol , trazodone , tretinoin, tylenol , and tyrvaya .   Allergies: Patient is allergic to alpha-gal, demerol [meperidine], morphine, and nsaids.   Social History: Patient  reports that she has never smoked. She has never used smokeless tobacco. She reports that she does not drink alcohol and does not use drugs.     OBJECTIVE     Physical Exam: Vitals:   10/13/24 1051  BP: 136/74  Pulse: 62   Gen: No apparent distress, A&O x 3.  Detailed Urogynecologic  Evaluation:  Deferred.        ASSESSMENT AND PLAN    [name] is a 79 y.o. with:  1. OAB (overactive bladder)   2. Feeling of incomplete bladder emptying   3. Nocturia   4. Vaginal atrophy   5. SUI (stress urinary incontinence, female)     OAB (overactive bladder) Assessment & Plan: -  01/20/24 POCT UA negative, PVR WNL - We discussed the symptoms of overactive bladder (OAB), which include urinary urgency, urinary frequency, nocturia, with or without urge incontinence.  While we do not know the exact etiology of OAB, several treatment options exist. We discussed management including behavioral therapy (decreasing bladder irritants, urge suppression strategies, timed voids, bladder retraining), physical therapy, medication; for refractory cases posterior tibial nerve stimulation, sacral neuromodulation, and intravesical botulinum toxin injection.  For anticholinergic medications, we discussed the potential side effects of anticholinergics including dry eyes, dry mouth, constipation, cognitive impairment and urinary retention. For Beta-3 agonist medication, we discussed the potential side effect of elevated blood pressure which is more likely to occur in individuals with uncontrolled hypertension. - continue gemtesa  due to reduced dry eyes compared to mirabegron , declines additional treatments or interventions at this time until after foot surgery. Rx resent for Gemtesa  - failed vesicare - UDS 2015 with normal sensation, MCC , DO and SUI with VLPP 52mL. max 90mL/sec, Pdet Qmax 35-40cmH2O. Dyssynergic EMG and straining. Bladder neck mild funneling and 2cm descent. No reflux - encouraged to continue weight reduction and discussed need for bladder diary prior to 3rd line therapy to r/o nocturnal polyuria due to exclusive night time symptoms - encouraged to consider sleep study to r/o OSA  Orders: -     Gemtesa ; Take 1 tablet (75 mg total) by mouth daily.  Dispense: 30 tablet; Refill:  11  Feeling of incomplete bladder emptying Assessment & Plan: - previously most bothersome symptoms, now resolved - reports symptoms with prior urology workup, denies [facility] teaching or need for foley placement - 07/25/14 vUDS: 1st sens 266, normal desire 281, strong desire 290, MCC . DO, + SUI, VLPP 52cmH2O. Pressure flow void , Qmax 28mL/sec, Pdet Qmax 35-40cmH2O. Dyssynergic EMG and straining. Bladder neck mild funneling and 2cm descent. No reflux - prior catheterization for 70mL to confirm after bladder scan 56mL due to history, repeat if clinical change - encouraged to return to pelvic floor PT due to possible association with pelvic floor myofascial pain after foot surgery if symptoms return   Nocturia Assessment & Plan: - continue avoid fluid intake 3 hours before bedtime and encouraged fluid management and reduction of caffeine - elevated feet during the day or use compression socks to reduce lower extremity swelling - Lisinopril -HCTZ dosing to 2pm - continue Gemtesa  at bedtime - declines additional medication or treatments at this time until after foot surgery - encouraged to consider sleep study to r/o sleep apnea if refractory symptoms - need for bladder diary prior to 3rd line therapy to r/o nocturnal polyuria  Orders: -     Gemtesa ; Take 1 tablet (75 mg total) by mouth daily.  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   - We discussed the potential risks associated with hormone replacement including stroke, heart attack, and blood  clots; and the fact that these risks are very low with vaginal estrogen use due to the very low systemic absorption rate of ~ 0.01% with a twice-week regimen. - continue low dose vaginal estrogen 0.5g twice a week   SUI (stress urinary incontinence, female) Assessment &  Plan: - SUI noted on UDS in 2015 - For treatment of stress urinary incontinence,  non-surgical options include expectant management, weight loss, physical therapy, as well as a pessary.  Surgical options include a midurethral sling, Burch urethropexy, and transurethral injection of a bulking agent. - encouraged to continue weight reduction and return to pelvic floor PT after treatment of foot pain - continue low dose vaginal estrogen   Time spent: I spent 21 minutes dedicated to the care of this patient on the date of this encounter to include pre-visit review of records, face-to-face time with the patient discussing OAB, sensation of incomplete emptying, nocturia, vaginal atrophy, and post visit documentation and ordering medication/ testing.    Lianne ONEIDA Gillis, MD

## 2024-10-13 ENCOUNTER — Ambulatory Visit: Admitting: Obstetrics

## 2024-10-13 VITALS — BP 136/74 | HR 62

## 2024-10-13 DIAGNOSIS — R351 Nocturia: Secondary | ICD-10-CM | POA: Diagnosis not present

## 2024-10-13 DIAGNOSIS — R3914 Feeling of incomplete bladder emptying: Secondary | ICD-10-CM | POA: Diagnosis not present

## 2024-10-13 DIAGNOSIS — N3281 Overactive bladder: Secondary | ICD-10-CM | POA: Diagnosis not present

## 2024-10-13 DIAGNOSIS — N393 Stress incontinence (female) (male): Secondary | ICD-10-CM

## 2024-10-13 DIAGNOSIS — N952 Postmenopausal atrophic vaginitis: Secondary | ICD-10-CM

## 2024-10-13 MED ORDER — GEMTESA 75 MG PO TABS: 1.0000 | ORAL_TABLET | Freq: Every day | ORAL | 11 refills | Status: AC

## 2024-10-13 NOTE — Assessment & Plan Note (Signed)
-   continue avoid fluid intake 3 hours before bedtime and encouraged fluid management and reduction of caffeine - elevated feet during the day or use compression socks to reduce lower extremity swelling - Lisinopril -HCTZ dosing to 2pm - continue Gemtesa  at bedtime - declines additional medication or treatments at this time until after foot surgery - encouraged to consider sleep study to r/o sleep apnea if refractory symptoms - need for bladder diary prior to 3rd line therapy to r/o nocturnal polyuria

## 2024-10-13 NOTE — Assessment & Plan Note (Signed)
-   SUI noted on UDS in 2015 - For treatment of stress urinary incontinence,  non-surgical options include expectant management, weight loss, physical therapy, as well as a pessary.  Surgical options include a midurethral sling, Burch urethropexy, and transurethral injection of a bulking agent. - encouraged to continue weight reduction and return to pelvic floor PT after treatment of foot pain - continue low dose vaginal estrogen

## 2024-10-13 NOTE — Assessment & Plan Note (Addendum)
-   01/20/24 POCT UA negative, PVR WNL - We discussed the symptoms of overactive bladder (OAB), which include urinary urgency, urinary frequency, nocturia, with or without urge incontinence.  While we do not know the exact etiology of OAB, several treatment options exist. We discussed management including behavioral therapy (decreasing bladder irritants, urge suppression strategies, timed voids, bladder retraining), physical therapy, medication; for refractory cases posterior tibial nerve stimulation, sacral neuromodulation, and intravesical botulinum toxin injection.  For anticholinergic medications, we discussed the potential side effects of anticholinergics including dry eyes, dry mouth, constipation, cognitive impairment and urinary retention. For Beta-3 agonist medication, we discussed the potential side effect of elevated blood pressure which is more likely to occur in individuals with uncontrolled hypertension. - continue gemtesa  due to reduced dry eyes compared to mirabegron , declines additional treatments or interventions at this time until after foot surgery. Rx resent for Gemtesa  - failed vesicare - UDS 2015 with normal sensation, MCC , DO and SUI with VLPP 52mL. max 89mL/sec, Pdet Qmax 35-40cmH2O. Dyssynergic EMG and straining. Bladder neck mild funneling and 2cm descent. No reflux - encouraged to continue weight reduction and discussed need for bladder diary prior to 3rd line therapy to r/o nocturnal polyuria due to exclusive night time symptoms - encouraged to consider sleep study to r/o OSA

## 2024-10-13 NOTE — Assessment & Plan Note (Addendum)
-   previously most bothersome symptoms, now resolved - reports symptoms with prior urology workup, denies [facility] teaching or need for foley placement - 07/25/14 vUDS: 1st sens 266, normal desire 281, strong desire 290, MCC . DO, + SUI, VLPP 52cmH2O. Pressure flow void , Qmax 31mL/sec, Pdet Qmax 35-40cmH2O. Dyssynergic EMG and straining. Bladder neck mild funneling and 2cm descent. No reflux - prior catheterization for 70mL to confirm after bladder scan 56mL due to history, repeat if clinical change - encouraged to return to pelvic floor PT due to possible association with pelvic floor myofascial pain after foot surgery if symptoms return

## 2024-10-13 NOTE — Patient Instructions (Addendum)
 Continue Gemtesa  at bedtime  Continue weight reduction efforts.   Continue to reduce fluid and caffeine intake.   Call if you experience any change in urinary or vaginal symptoms.  When your foot pain improves after surgery, consider sleep study or return to pelvic floor PT.

## 2024-10-15 ENCOUNTER — Encounter: Payer: Self-pay | Admitting: Physical Medicine & Rehabilitation

## 2024-10-15 ENCOUNTER — Other Ambulatory Visit (INDEPENDENT_AMBULATORY_CARE_PROVIDER_SITE_OTHER): Payer: Self-pay | Admitting: Physician Assistant

## 2024-10-17 ENCOUNTER — Encounter: Admitting: Physical Medicine & Rehabilitation

## 2024-10-18 ENCOUNTER — Ambulatory Visit (HOSPITAL_COMMUNITY): Admitting: Psychiatry

## 2024-10-18 ENCOUNTER — Other Ambulatory Visit: Payer: Self-pay

## 2024-10-18 ENCOUNTER — Encounter (HOSPITAL_COMMUNITY): Payer: Self-pay | Admitting: Psychiatry

## 2024-10-18 VITALS — BP 148/79 | HR 74 | Ht 64.0 in | Wt 251.0 lb

## 2024-10-18 DIAGNOSIS — F325 Major depressive disorder, single episode, in full remission: Secondary | ICD-10-CM

## 2024-10-18 MED ORDER — DULOXETINE HCL 60 MG PO CPEP: 60.0000 mg | ORAL_CAPSULE | Freq: Every day | ORAL | 3 refills | Status: AC

## 2024-10-18 MED ORDER — BUPROPION HCL ER (XL) 150 MG PO TB24: 150.0000 mg | ORAL_TABLET | Freq: Every morning | ORAL | 3 refills | Status: AC

## 2024-10-18 NOTE — Progress Notes (Signed)
 " Psychiatric Initial Adult Assessment   Patient Identification: [name] MRN:  [id number] Date of Evaluation:  10/18/2024 Referral Source:  Chief Complaint: Depressed mood Visit Diagnosis:   History of Present Illn    Today the patient is doing fairly well.  She has an appointment coming up with Dr. Prentice Ovens who is her podiatrist.  Potential surgery might occur in the near future.  She put off her sleep study.  Overall though she feels pretty well.  Her mood is good.  She takes Cymbalta  60 mg and low-dose Wellbutrin .  She has an upcoming therapy appointment with Dr. Richerd Pih.  Patient is doing very well.  Financially she is very stable.  The patient lives alone.  Her daughter lives in [place].  The patient's mood is very stable and she is functioning very well.  She denies the use of alcohol or drugs.  Associated Signs/Symptoms: Depression Symptoms:  depressed mood, (Hypo) Manic Symptoms:   Anxiety Symptoms:   Psychotic Symptoms:   PTSD Symptoms: NA  Past Psychiatric History: Multiple antidepressants and multiple courses of psychotherapy.  She just recently ended therapy from a therapist at [facility].  Previous Psychotropic Medications: Yes  Substance Abuse History in the last 12 months:  No.  Consequences of Substance Abuse: NA  Past Medical History:  Past Medical History:  Diagnosis Date   Acne    Acute UTI 01/23/2014   to start ABX 01/24/2014   Allergy    Anxiety    Arthritis    feet, knee and base of thumbs   Asthma    controlled   Bilateral swelling of feet    Breast cancer (HCC) 2012   Right Breast Cancer   Breast cancer, stage 1 (HCC) 07/18/2011   Carpal tunnel syndrome    right   Dehydration 10/21/2011   Depression    Fibromyalgia    Fibromyalgia    Hepatitis 1976   hepatitis B   History of hiatal hernia    Hyperlipidemia    Hypertension    Insulin  resistance    Mononucleosis 1952   Multiple allergies     Nephritis 1955   Osteopenia    Personal history of chemotherapy 2012   Right Breast Cancer   PONV (postoperative nausea and vomiting)    has had to use scop patch    Past Surgical History:  Procedure Laterality Date   ABDOMINAL HYSTERECTOMY  09/15/1979   BREAST CAPSULECTOMY WITH IMPLANT EXCHANGE Right 03/02/2013   Procedure: BREAST CAPSULECTOMY WITH REPOSITIONING THE IMPLANT;  Surgeon: Estefana Reichert, DO;  Location: [facility];  Service: Plastics;  Laterality: Right;   BREAST SURGERY     BUNIONECTOMY  09/14/1996   left foot   CATARACT EXTRACTION Left    ETHMOIDECTOMY  09/14/1989   EYE SURGERY     facial plastic surgery  09/14/1994   JOINT REPLACEMENT Left 09/15/2003   KNEE ARTHROPLASTY Right 01/29/2014   Procedure: COMPUTER ASSISTED TOTAL KNEE ARTHROPLASTY;  Surgeon: Oneil JAYSON Herald, MD;  Location: [facility];  Service: Orthopedics;  Laterality: Right;  Right Total Knee Arthroplasty   KNEE ARTHROSCOPY  1996, 2001   left   lap band surgery  09/14/2008   LIPOSUCTION Bilateral 03/02/2013   Procedure: LIPOSUCTION;  Surgeon: Estefana Reichert, DO;  Location:  SURGERY CENTER;  Service: Plastics;  Laterality: Bilateral;   MASTECTOMY Right 09/14/2010   w/chemo   MASTOPEXY  04/13/2012   Procedure: MASTOPEXY;  Surgeon: Estefana Reichert, DO;  Location: [facility];  Service: Plastics;  Laterality: Left;   left breast  mastopexy reduction for symmetry   NASAL SINUS SURGERY  09/15/1999   port-a-cath placement  06/26/2011   tip in lower SVC per chest x-ray read by Dr. Deward Dames   [facility] REMOVAL Left 03/02/2013   Procedure: REMOVAL PORT-A-CATH;  Surgeon: Donnice Bury, MD;  Location: [facility];  Service: General;  Laterality: Left;   right breast lumpectomy  09/14/1984   right ear surgery  09/14/1981   tumor removed (?)   right mastectomy  06/26/2011   TONSILLECTOMY  09/14/1948   TOTAL KNEE ARTHROPLASTY  09/14/2004   left   TUBAL LIGATION   09/14/1976    Family Psychiatric History: Bipolar disorder  Family History:  Family History  Problem Relation Age of Onset   Depression Mother    Stroke Mother    Hypertension Mother    Heart disease Father    Hypertension Father    High Cholesterol Father    Breast cancer Paternal Grandmother    Breast cancer Paternal Aunt    Stroke Other    Hypertension Other    Heart disease Other    Uterine cancer Neg Hx    Rectal cancer Neg Hx    Bladder Cancer Neg Hx     Social History:   Social History   Socioeconomic History   Marital status: Single    Spouse name: Not on file   Number of children: Not on file   Years of education: Not on file   Highest education level: Not on file  Occupational History   Not on file  Tobacco Use   Smoking status: Never   Smokeless tobacco: Never  Vaping Use   Vaping status: Never Used  Substance and Sexual Activity   Alcohol use: No    Alcohol/week: 0.0 standard drinks of alcohol   Drug use: No   Sexual activity: Not Currently    Partners: Male    Birth control/protection: Surgical  Other Topics Concern   Not on file  Social History Narrative   22oz of decaf coffee in the morning    Social Drivers of Health   Tobacco Use: Low Risk (10/18/2024)   Patient History    Smoking Tobacco Use: Never    Smokeless Tobacco Use: Never    Passive Exposure: Not on file  Financial Resource Strain: Not on file  Food Insecurity: Patient Declined (03/10/2023)   Hunger Vital Sign    Worried About Running Out of Food in the Last Year: Patient declined    Ran Out of Food in the Last Year: Patient declined  Transportation Needs: Unknown (03/10/2023)   PRAPARE - Administrator, Civil Service (Medical): Patient declined    Lack of Transportation (Non-Medical): Not on file  Physical Activity: Not on file  Stress: Not on file  Social Connections: Not on file  Depression (PHQ2-9): High Risk (05/09/2024)   Depression (PHQ2-9)    PHQ-2 Score:  13  Alcohol Screen: Not on file  Housing: Patient Declined (03/11/2023)   Housing    Last Housing Risk Score: 98  Utilities: Patient Declined (03/10/2023)   [facility] Utilities    Threatened with loss of utilities: Patient declined  Health Literacy: Not on file   Additional Social History:   Allergies:   Allergies  Allergen Reactions   Alpha-Gal Anaphylaxis, Hives, Diarrhea and Other (See Comments)    NOTHING that includes anything mammal-related or sourced  Remission- 09-2024  Demerol [Meperidine] Nausea And Vomiting   Morphine Nausea And Vomiting and Other (See Comments)    PCA pump   Nsaids     Other Reaction(s): Unknown    Metabolic Disorder Labs: Lab Results  Component Value Date   HGBA1C 5.4 06/07/2024   No results found for: PROLACTIN Lab Results  Component Value Date   CHOL 184 06/07/2024   TRIG 82 06/07/2024   HDL 66 06/07/2024   LDLCALC 103 (H) 06/07/2024   LDLCALC 100 (H) 11/18/2023   Lab Results  Component Value Date   TSH 2.800 11/18/2023    Therapeutic Level Labs: No results found for: LITHIUM No results found for: CBMZ No results found for: VALPROATE  Current Medications: Current Outpatient Medications  Medication Sig Dispense Refill   amoxicillin (AMOXIL) 500 MG capsule Take 500 mg by mouth See admin instructions. Take 2,000 mg by mouth one hour prior to dental procedures     Ascorbic Acid (VITAMIN C PO) Take 1 tablet by mouth daily with breakfast.     cetirizine  HCl (ZYRTEC ) 5 MG/5ML SOLN Take 10 mLs (10 mg total) by mouth daily.     Cholecalciferol (VITAMIN D -3) 5000 units TABS Take 5,000 Units by mouth daily. Plus K 2     co-enzyme Q-10 30 MG capsule Take 100 mg by mouth 3 (three) times daily.     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     EPIPEN  2-PAK 0.3 MG/0.3ML SOAJ injection Inject 0.3 mg into the muscle as needed for  anaphylaxis.     estradiol  (ESTRACE ) 0.1 MG/GM vaginal cream Place 0.5g nightly for two weeks then twice a week after 30 g 3   famotidine  (PEPCID ) 20 MG tablet Take 20 mg by mouth 2 (two) times daily as needed (for allergic reactions (re: Alpha-Gal)).     fluticasone  (FLONASE ) 50 MCG/ACT nasal spray Place 1 spray into both nostrils daily.     GEMTESA  75 MG TABS Take 1 tablet (75 mg total) by mouth daily. 30 tablet 11   lisinopril -hydrochlorothiazide  (ZESTORETIC ) 10-12.5 MG tablet Take 1 tablet by mouth daily.     magnesium  gluconate (MAGONATE) 500 MG tablet Take 500 mg by mouth at bedtime.     metFORMIN  (GLUCOPHAGE ) 1000 MG tablet Take 1 tablet (1,000 mg total) by mouth daily with breakfast. 30 tablet 0   Multiple Vitamin (MULTIVITAMIN) capsule Take 1 capsule by mouth every morning.      NALTREXONE HCL PO Take 1 tablet by mouth 2 (two) times daily.     Omega-3 Fatty Acids (FISH OIL PO) Take 1,400 mg by mouth daily.     rosuvastatin  (CRESTOR ) 5 MG tablet Take 5 mg by mouth daily.  6   Suzetrigine  50 MG TABS Take 50 mg by mouth every 12 (twelve) hours as needed. Take two 50mg  capsules for your first dose at the start of a pain episode on an empty stomach, followed by 50mg  (1 capsule)  with or without food every 12 hours until pain episode is improved 29 tablet 0   traMADol  (ULTRAM ) 50 MG tablet Take 50 mg by mouth every 6 (six) hours as needed (for pain).     traZODone  (DESYREL ) 50 MG tablet Take 50 mg by mouth at bedtime as needed for sleep.     tretinoin (RETIN-A) 0.1 % cream Apply 1 application topically at bedtime.     TYLENOL  500 MG  tablet Take 500-1,000 mg by mouth every 6 (six) hours as needed for mild pain or headache.     Varenicline  Tartrate (TYRVAYA ) 0.03 MG/ACT SOLN Place 1 spray into both nostrils 2 (two) times daily.     buPROPion  (WELLBUTRIN  XL) 150 MG 24 hr tablet Take 1 tablet (150 mg total) by mouth every morning. 90 tablet 3   DULoxetine  (CYMBALTA ) 60 MG capsule Take 1 capsule (60  mg total) by mouth daily. 90 capsule 3   Quercetin 500 MG CAPS Take by mouth.     No current facility-administered medications for this visit.    Musculoskeletal: Strength & Muscle Tone: abnormal Gait & Station: unsteady Patient leans:   Psychiatric Specialty Exam: Review of Systems  Blood pressure (!) 148/79, pulse 74, height 5' 4 (1.626 m), weight 251 lb (113.9 kg).Body mass index is 43.08 kg/m.  General Appearance: Casual  Eye Contact:  Good  Speech:  Clear and Coherent  Volume:  Normal  Mood:  Depressed  Affect:  Appropriate  Thought Process:  Coherent  Orientation:  Full (Time, Place, and Person)  Thought Content:  WDL  Suicidal Thoughts:  No  Homicidal Thoughts:  No  Memory:  NA  Judgement:  Good  Insight:  Good  Psychomotor Activity:  Normal  Concentration:    Recall:  Good  Fund of Knowledge:Good  Language: Good  Akathisia:  No  Handed:  Right  AIMS (if indicated):  not done  Assets:  Desire for Improvement  ADL's:    Cognition: WNL  Sleep:  Good   Screenings: PHQ2-9    Flowsheet Row [facility] Visit [facility]20[facility] Office Visit from 03/31/2024 in [facility] Nutrition from 08/19/2017 in [facility]  - A Dept Of 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  She continues taking Cymbalta  60 mg and Wellbutrin .  She has an upcoming appointment with a therapist.  At this time  she is very stable.  She will return to see me in 5 months.  Collaboration of Care:   Patient/Guardian was advised Release of Information must be obtained prior to any record release in order to collaborate their care with an outside provider. Patient/Guardian was advised if they have not already done so to contact the registration department to sign all necessary forms in order for us  to release information regarding their care.   Consent: Patient/Guardian gives verbal consent for treatment and assignment of benefits for services provided during this visit. Patient/Guardian expressed understanding and agreed to proceed.   Elna LILLETTE Lo, MD 2/4/20262:12 PM  "

## 2024-10-19 ENCOUNTER — Encounter: Payer: Self-pay | Admitting: Physical Medicine & Rehabilitation

## 2024-10-19 ENCOUNTER — Encounter (HOSPITAL_COMMUNITY): Payer: Self-pay

## 2024-10-25 ENCOUNTER — Ambulatory Visit (INDEPENDENT_AMBULATORY_CARE_PROVIDER_SITE_OTHER): Admitting: Physician Assistant

## 2024-10-31 ENCOUNTER — Ambulatory Visit: Admitting: Behavioral Health

## 2024-11-03 ENCOUNTER — Ambulatory Visit

## 2024-11-06 ENCOUNTER — Encounter: Admitting: Physical Medicine & Rehabilitation

## 2025-03-28 ENCOUNTER — Ambulatory Visit (HOSPITAL_COMMUNITY): Admitting: Psychiatry
